# Patient Record
Sex: Female | Born: 1935 | Race: White | Hispanic: No | Marital: Married | Smoking: Never smoker
Health system: Southern US, Community
[De-identification: ages and names within clinical notes are randomized; demographics above are authoritative.]

## PROBLEM LIST (undated history)

## (undated) ENCOUNTER — Emergency Department (HOSPITAL_COMMUNITY): Admission: EM | Discharge status: Absent without leave | Payer: Medicare Other

## (undated) DIAGNOSIS — M069 Rheumatoid arthritis, unspecified: Secondary | ICD-10-CM

## (undated) DIAGNOSIS — E669 Obesity, unspecified: Secondary | ICD-10-CM

## (undated) DIAGNOSIS — R001 Bradycardia, unspecified: Secondary | ICD-10-CM

## (undated) DIAGNOSIS — E1140 Type 2 diabetes mellitus with diabetic neuropathy, unspecified: Secondary | ICD-10-CM

## (undated) DIAGNOSIS — Z9889 Other specified postprocedural states: Secondary | ICD-10-CM

## (undated) DIAGNOSIS — I493 Ventricular premature depolarization: Secondary | ICD-10-CM

## (undated) DIAGNOSIS — K589 Irritable bowel syndrome without diarrhea: Secondary | ICD-10-CM

## (undated) DIAGNOSIS — Z7901 Long term (current) use of anticoagulants: Secondary | ICD-10-CM

## (undated) DIAGNOSIS — I4510 Unspecified right bundle-branch block: Secondary | ICD-10-CM

## (undated) DIAGNOSIS — N179 Acute kidney failure, unspecified: Secondary | ICD-10-CM

## (undated) DIAGNOSIS — M810 Age-related osteoporosis without current pathological fracture: Secondary | ICD-10-CM

## (undated) DIAGNOSIS — I440 Atrioventricular block, first degree: Secondary | ICD-10-CM

## (undated) DIAGNOSIS — E785 Hyperlipidemia, unspecified: Secondary | ICD-10-CM

## (undated) DIAGNOSIS — E119 Type 2 diabetes mellitus without complications: Secondary | ICD-10-CM

## (undated) DIAGNOSIS — I2510 Atherosclerotic heart disease of native coronary artery without angina pectoris: Secondary | ICD-10-CM

## (undated) DIAGNOSIS — J189 Pneumonia, unspecified organism: Secondary | ICD-10-CM

## (undated) DIAGNOSIS — N183 Chronic kidney disease, stage 3 unspecified: Secondary | ICD-10-CM

## (undated) DIAGNOSIS — M4800 Spinal stenosis, site unspecified: Secondary | ICD-10-CM

## (undated) DIAGNOSIS — K5732 Diverticulitis of large intestine without perforation or abscess without bleeding: Secondary | ICD-10-CM

## (undated) DIAGNOSIS — E042 Nontoxic multinodular goiter: Secondary | ICD-10-CM

## (undated) DIAGNOSIS — I10 Essential (primary) hypertension: Secondary | ICD-10-CM

## (undated) DIAGNOSIS — M1990 Unspecified osteoarthritis, unspecified site: Secondary | ICD-10-CM

## (undated) DIAGNOSIS — I951 Orthostatic hypotension: Secondary | ICD-10-CM

## (undated) DIAGNOSIS — C801 Malignant (primary) neoplasm, unspecified: Secondary | ICD-10-CM

## (undated) DIAGNOSIS — D649 Anemia, unspecified: Secondary | ICD-10-CM

## (undated) DIAGNOSIS — K219 Gastro-esophageal reflux disease without esophagitis: Secondary | ICD-10-CM

## (undated) HISTORY — DX: Nontoxic multinodular goiter: E04.2

## (undated) HISTORY — DX: Essential (primary) hypertension: I10

## (undated) HISTORY — PX: OTHER SURGICAL HISTORY: SHX169

## (undated) HISTORY — PX: CORONARY ANGIOPLASTY: SHX604

## (undated) HISTORY — DX: Rheumatoid arthritis, unspecified: M06.9

## (undated) HISTORY — DX: Long term (current) use of anticoagulants: Z79.01

## (undated) HISTORY — DX: Gastro-esophageal reflux disease without esophagitis: K21.9

## (undated) HISTORY — PX: CHOLECYSTECTOMY: SHX55

## (undated) HISTORY — PX: NASAL SINUS SURGERY: SHX719

## (undated) HISTORY — DX: Acute kidney failure, unspecified: N17.9

## (undated) HISTORY — DX: Obesity, unspecified: E66.9

## (undated) HISTORY — DX: Atherosclerotic heart disease of native coronary artery without angina pectoris: I25.10

## (undated) HISTORY — DX: Unspecified osteoarthritis, unspecified site: M19.90

## (undated) HISTORY — DX: Hyperlipidemia, unspecified: E78.5

## (undated) HISTORY — DX: Diverticulitis of large intestine without perforation or abscess without bleeding: K57.32

## (undated) HISTORY — DX: Type 2 diabetes mellitus without complications: E11.9

## (undated) HISTORY — DX: Orthostatic hypotension: I95.1

## (undated) HISTORY — DX: Type 2 diabetes mellitus with diabetic neuropathy, unspecified: E11.40

## (undated) HISTORY — DX: Other specified postprocedural states: Z98.890

## (undated) HISTORY — DX: Age-related osteoporosis without current pathological fracture: M81.0

## (undated) HISTORY — DX: Spinal stenosis, site unspecified: M48.00

## (undated) HISTORY — DX: Irritable bowel syndrome, unspecified: K58.9

## (undated) HISTORY — PX: ABDOMINAL HYSTERECTOMY: SHX81

## (undated) HISTORY — DX: Anemia, unspecified: D64.9

---

## 1997-11-17 ENCOUNTER — Ambulatory Visit (HOSPITAL_COMMUNITY): Admission: RE | Admit: 1997-11-17 | Discharge: 1997-11-17 | Payer: Self-pay | Admitting: Otolaryngology

## 1998-01-03 ENCOUNTER — Ambulatory Visit (HOSPITAL_BASED_OUTPATIENT_CLINIC_OR_DEPARTMENT_OTHER): Admission: RE | Admit: 1998-01-03 | Discharge: 1998-01-03 | Payer: Self-pay | Admitting: Otolaryngology

## 1998-06-28 ENCOUNTER — Encounter: Admission: RE | Admit: 1998-06-28 | Discharge: 1998-06-28 | Payer: Self-pay | Admitting: Infectious Diseases

## 1999-08-08 ENCOUNTER — Encounter: Admission: RE | Admit: 1999-08-08 | Discharge: 1999-08-08 | Payer: Self-pay | Admitting: Family Medicine

## 1999-08-08 ENCOUNTER — Encounter: Payer: Self-pay | Admitting: Family Medicine

## 2000-03-15 ENCOUNTER — Encounter: Payer: Self-pay | Admitting: Family Medicine

## 2000-03-15 ENCOUNTER — Encounter: Admission: RE | Admit: 2000-03-15 | Discharge: 2000-03-15 | Payer: Self-pay | Admitting: Family Medicine

## 2000-10-03 ENCOUNTER — Encounter: Payer: Self-pay | Admitting: Internal Medicine

## 2000-10-04 ENCOUNTER — Encounter: Payer: Self-pay | Admitting: Internal Medicine

## 2000-10-04 ENCOUNTER — Ambulatory Visit (HOSPITAL_COMMUNITY): Admission: RE | Admit: 2000-10-04 | Discharge: 2000-10-04 | Payer: Self-pay | Admitting: Internal Medicine

## 2000-10-10 ENCOUNTER — Other Ambulatory Visit: Admission: RE | Admit: 2000-10-10 | Discharge: 2000-10-10 | Payer: Self-pay | Admitting: Obstetrics and Gynecology

## 2001-02-04 ENCOUNTER — Ambulatory Visit: Admission: RE | Admit: 2001-02-04 | Discharge: 2001-02-04 | Payer: Self-pay | Admitting: Family Medicine

## 2001-03-17 ENCOUNTER — Encounter: Payer: Self-pay | Admitting: Family Medicine

## 2001-03-17 ENCOUNTER — Encounter: Admission: RE | Admit: 2001-03-17 | Discharge: 2001-03-17 | Payer: Self-pay | Admitting: Family Medicine

## 2002-01-12 ENCOUNTER — Encounter: Payer: Self-pay | Admitting: Family Medicine

## 2002-01-12 ENCOUNTER — Encounter: Admission: RE | Admit: 2002-01-12 | Discharge: 2002-01-12 | Payer: Self-pay | Admitting: Family Medicine

## 2002-03-31 ENCOUNTER — Encounter: Payer: Self-pay | Admitting: Family Medicine

## 2002-03-31 ENCOUNTER — Encounter: Admission: RE | Admit: 2002-03-31 | Discharge: 2002-03-31 | Payer: Self-pay | Admitting: Family Medicine

## 2002-04-24 ENCOUNTER — Other Ambulatory Visit: Admission: RE | Admit: 2002-04-24 | Discharge: 2002-04-24 | Payer: Self-pay | Admitting: Obstetrics and Gynecology

## 2003-01-02 ENCOUNTER — Encounter: Admission: RE | Admit: 2003-01-02 | Discharge: 2003-01-02 | Payer: Self-pay | Admitting: Family Medicine

## 2003-01-02 ENCOUNTER — Encounter: Payer: Self-pay | Admitting: Family Medicine

## 2003-02-11 ENCOUNTER — Encounter: Payer: Self-pay | Admitting: Family Medicine

## 2003-02-11 ENCOUNTER — Encounter: Admission: RE | Admit: 2003-02-11 | Discharge: 2003-02-11 | Payer: Self-pay | Admitting: Family Medicine

## 2003-03-03 ENCOUNTER — Ambulatory Visit (HOSPITAL_COMMUNITY): Admission: RE | Admit: 2003-03-03 | Discharge: 2003-03-03 | Payer: Self-pay | Admitting: Family Medicine

## 2003-03-03 ENCOUNTER — Encounter: Payer: Self-pay | Admitting: Family Medicine

## 2003-04-05 ENCOUNTER — Encounter: Payer: Self-pay | Admitting: Family Medicine

## 2003-04-05 ENCOUNTER — Encounter: Admission: RE | Admit: 2003-04-05 | Discharge: 2003-04-05 | Payer: Self-pay | Admitting: Family Medicine

## 2003-04-21 ENCOUNTER — Ambulatory Visit: Admission: RE | Admit: 2003-04-21 | Discharge: 2003-04-21 | Payer: Self-pay | Admitting: Orthopedic Surgery

## 2003-05-17 ENCOUNTER — Other Ambulatory Visit: Admission: RE | Admit: 2003-05-17 | Discharge: 2003-05-17 | Payer: Self-pay | Admitting: Obstetrics and Gynecology

## 2004-01-03 ENCOUNTER — Observation Stay (HOSPITAL_COMMUNITY): Admission: AD | Admit: 2004-01-03 | Discharge: 2004-01-07 | Payer: Self-pay | Admitting: *Deleted

## 2004-04-06 ENCOUNTER — Encounter: Admission: RE | Admit: 2004-04-06 | Discharge: 2004-04-06 | Payer: Self-pay | Admitting: Internal Medicine

## 2004-05-25 ENCOUNTER — Other Ambulatory Visit: Admission: RE | Admit: 2004-05-25 | Discharge: 2004-05-25 | Payer: Self-pay | Admitting: Obstetrics and Gynecology

## 2004-05-29 ENCOUNTER — Ambulatory Visit: Payer: Self-pay | Admitting: Internal Medicine

## 2004-06-08 ENCOUNTER — Ambulatory Visit: Payer: Self-pay | Admitting: Internal Medicine

## 2004-07-10 ENCOUNTER — Ambulatory Visit: Payer: Self-pay | Admitting: Internal Medicine

## 2004-07-19 ENCOUNTER — Ambulatory Visit: Payer: Self-pay | Admitting: Internal Medicine

## 2004-07-26 ENCOUNTER — Ambulatory Visit: Payer: Self-pay | Admitting: Internal Medicine

## 2004-07-31 ENCOUNTER — Ambulatory Visit: Payer: Self-pay | Admitting: Oncology

## 2004-08-11 ENCOUNTER — Ambulatory Visit: Payer: Self-pay | Admitting: Internal Medicine

## 2004-08-31 ENCOUNTER — Ambulatory Visit: Payer: Self-pay | Admitting: Internal Medicine

## 2004-11-21 ENCOUNTER — Ambulatory Visit: Payer: Self-pay | Admitting: Internal Medicine

## 2004-11-28 ENCOUNTER — Ambulatory Visit: Payer: Self-pay | Admitting: Internal Medicine

## 2004-12-14 ENCOUNTER — Ambulatory Visit: Payer: Self-pay | Admitting: Internal Medicine

## 2004-12-25 ENCOUNTER — Ambulatory Visit: Payer: Self-pay | Admitting: Internal Medicine

## 2005-01-05 ENCOUNTER — Inpatient Hospital Stay (HOSPITAL_COMMUNITY): Admission: EM | Admit: 2005-01-05 | Discharge: 2005-01-09 | Payer: Self-pay | Admitting: Emergency Medicine

## 2005-01-05 ENCOUNTER — Ambulatory Visit: Payer: Self-pay | Admitting: Cardiovascular Disease

## 2005-01-05 ENCOUNTER — Ambulatory Visit: Payer: Self-pay | Admitting: Internal Medicine

## 2005-01-25 ENCOUNTER — Ambulatory Visit: Payer: Self-pay | Admitting: *Deleted

## 2005-02-12 ENCOUNTER — Ambulatory Visit: Payer: Self-pay | Admitting: Internal Medicine

## 2005-02-12 ENCOUNTER — Encounter (HOSPITAL_COMMUNITY): Admission: RE | Admit: 2005-02-12 | Discharge: 2005-05-13 | Payer: Self-pay | Admitting: Internal Medicine

## 2005-02-19 ENCOUNTER — Ambulatory Visit: Payer: Self-pay | Admitting: Internal Medicine

## 2005-02-19 ENCOUNTER — Ambulatory Visit: Payer: Self-pay

## 2005-02-20 ENCOUNTER — Ambulatory Visit: Payer: Self-pay | Admitting: Internal Medicine

## 2005-03-13 ENCOUNTER — Ambulatory Visit: Payer: Self-pay | Admitting: Internal Medicine

## 2005-03-29 ENCOUNTER — Ambulatory Visit: Payer: Self-pay | Admitting: Internal Medicine

## 2005-04-17 ENCOUNTER — Ambulatory Visit: Payer: Self-pay | Admitting: Internal Medicine

## 2005-04-24 ENCOUNTER — Encounter: Admission: RE | Admit: 2005-04-24 | Discharge: 2005-04-24 | Payer: Self-pay | Admitting: Internal Medicine

## 2005-05-14 ENCOUNTER — Encounter (HOSPITAL_COMMUNITY): Admission: RE | Admit: 2005-05-14 | Discharge: 2005-06-16 | Payer: Self-pay | Admitting: Internal Medicine

## 2005-05-16 ENCOUNTER — Ambulatory Visit: Payer: Self-pay | Admitting: Internal Medicine

## 2005-06-15 ENCOUNTER — Ambulatory Visit: Payer: Self-pay | Admitting: Endocrinology

## 2005-07-09 ENCOUNTER — Encounter: Admission: RE | Admit: 2005-07-09 | Discharge: 2005-07-09 | Payer: Self-pay | Admitting: Neurology

## 2005-07-19 ENCOUNTER — Ambulatory Visit: Payer: Self-pay | Admitting: Internal Medicine

## 2005-08-16 ENCOUNTER — Ambulatory Visit: Payer: Self-pay | Admitting: Internal Medicine

## 2005-08-17 ENCOUNTER — Ambulatory Visit (HOSPITAL_COMMUNITY): Admission: RE | Admit: 2005-08-17 | Discharge: 2005-08-17 | Payer: Self-pay | Admitting: Internal Medicine

## 2005-11-12 ENCOUNTER — Ambulatory Visit: Payer: Self-pay | Admitting: Internal Medicine

## 2006-01-21 ENCOUNTER — Ambulatory Visit (HOSPITAL_COMMUNITY): Admission: RE | Admit: 2006-01-21 | Discharge: 2006-01-21 | Payer: Self-pay | Admitting: Internal Medicine

## 2006-03-18 ENCOUNTER — Ambulatory Visit: Payer: Self-pay

## 2006-04-30 ENCOUNTER — Emergency Department (HOSPITAL_COMMUNITY): Admission: EM | Admit: 2006-04-30 | Discharge: 2006-04-30 | Payer: Self-pay | Admitting: Emergency Medicine

## 2006-05-14 ENCOUNTER — Encounter: Admission: RE | Admit: 2006-05-14 | Discharge: 2006-05-14 | Payer: Self-pay | Admitting: Family Medicine

## 2006-09-09 ENCOUNTER — Ambulatory Visit: Payer: Self-pay | Admitting: Internal Medicine

## 2006-09-30 ENCOUNTER — Ambulatory Visit: Payer: Self-pay | Admitting: Internal Medicine

## 2006-12-23 ENCOUNTER — Ambulatory Visit: Payer: Self-pay | Admitting: Internal Medicine

## 2006-12-23 LAB — CONVERTED CEMR LAB
ALT: 20 units/L (ref 0–40)
AST: 25 units/L (ref 0–37)
LDL Cholesterol: 87 mg/dL (ref 0–99)
Total CHOL/HDL Ratio: 3.3
VLDL: 20 mg/dL (ref 0–40)

## 2007-02-12 DIAGNOSIS — G629 Polyneuropathy, unspecified: Secondary | ICD-10-CM | POA: Insufficient documentation

## 2007-02-12 DIAGNOSIS — K219 Gastro-esophageal reflux disease without esophagitis: Secondary | ICD-10-CM

## 2007-02-12 DIAGNOSIS — E119 Type 2 diabetes mellitus without complications: Secondary | ICD-10-CM

## 2007-02-12 DIAGNOSIS — D649 Anemia, unspecified: Secondary | ICD-10-CM | POA: Insufficient documentation

## 2007-02-12 DIAGNOSIS — J309 Allergic rhinitis, unspecified: Secondary | ICD-10-CM | POA: Insufficient documentation

## 2007-02-12 DIAGNOSIS — K5730 Diverticulosis of large intestine without perforation or abscess without bleeding: Secondary | ICD-10-CM | POA: Insufficient documentation

## 2007-02-12 DIAGNOSIS — E785 Hyperlipidemia, unspecified: Secondary | ICD-10-CM

## 2007-02-26 ENCOUNTER — Ambulatory Visit: Payer: Self-pay | Admitting: Internal Medicine

## 2007-03-05 ENCOUNTER — Encounter: Admission: RE | Admit: 2007-03-05 | Discharge: 2007-03-05 | Payer: Self-pay | Admitting: Internal Medicine

## 2007-04-23 ENCOUNTER — Ambulatory Visit: Payer: Self-pay | Admitting: Internal Medicine

## 2007-05-19 ENCOUNTER — Ambulatory Visit (HOSPITAL_COMMUNITY): Admission: RE | Admit: 2007-05-19 | Discharge: 2007-05-19 | Payer: Self-pay | Admitting: Internal Medicine

## 2007-05-19 ENCOUNTER — Encounter: Payer: Self-pay | Admitting: Internal Medicine

## 2007-05-26 ENCOUNTER — Ambulatory Visit: Payer: Self-pay | Admitting: Internal Medicine

## 2007-09-15 ENCOUNTER — Ambulatory Visit (HOSPITAL_COMMUNITY): Admission: RE | Admit: 2007-09-15 | Discharge: 2007-09-15 | Payer: Self-pay | Admitting: Otolaryngology

## 2007-09-30 DIAGNOSIS — M4800 Spinal stenosis, site unspecified: Secondary | ICD-10-CM

## 2007-09-30 DIAGNOSIS — E1149 Type 2 diabetes mellitus with other diabetic neurological complication: Secondary | ICD-10-CM

## 2007-09-30 DIAGNOSIS — K449 Diaphragmatic hernia without obstruction or gangrene: Secondary | ICD-10-CM | POA: Insufficient documentation

## 2007-09-30 DIAGNOSIS — E049 Nontoxic goiter, unspecified: Secondary | ICD-10-CM | POA: Insufficient documentation

## 2007-09-30 DIAGNOSIS — R609 Edema, unspecified: Secondary | ICD-10-CM

## 2007-09-30 DIAGNOSIS — M1990 Unspecified osteoarthritis, unspecified site: Secondary | ICD-10-CM | POA: Insufficient documentation

## 2007-09-30 DIAGNOSIS — E669 Obesity, unspecified: Secondary | ICD-10-CM

## 2007-10-15 ENCOUNTER — Ambulatory Visit: Payer: Self-pay | Admitting: Cardiology

## 2007-10-15 ENCOUNTER — Inpatient Hospital Stay (HOSPITAL_COMMUNITY): Admission: AD | Admit: 2007-10-15 | Discharge: 2007-10-17 | Payer: Self-pay | Admitting: Internal Medicine

## 2007-10-15 ENCOUNTER — Ambulatory Visit: Payer: Self-pay | Admitting: Internal Medicine

## 2007-10-29 ENCOUNTER — Ambulatory Visit: Payer: Self-pay | Admitting: Internal Medicine

## 2007-10-29 ENCOUNTER — Encounter: Payer: Self-pay | Admitting: Physician Assistant

## 2007-10-30 ENCOUNTER — Ambulatory Visit: Payer: Self-pay | Admitting: Internal Medicine

## 2007-10-30 ENCOUNTER — Inpatient Hospital Stay (HOSPITAL_COMMUNITY): Admission: EM | Admit: 2007-10-30 | Discharge: 2007-11-01 | Payer: Self-pay | Admitting: Emergency Medicine

## 2007-10-31 ENCOUNTER — Encounter (INDEPENDENT_AMBULATORY_CARE_PROVIDER_SITE_OTHER): Payer: Self-pay | Admitting: Internal Medicine

## 2007-12-03 ENCOUNTER — Ambulatory Visit: Payer: Self-pay | Admitting: Internal Medicine

## 2007-12-03 DIAGNOSIS — R079 Chest pain, unspecified: Secondary | ICD-10-CM

## 2007-12-03 DIAGNOSIS — R498 Other voice and resonance disorders: Secondary | ICD-10-CM | POA: Insufficient documentation

## 2007-12-04 ENCOUNTER — Ambulatory Visit: Payer: Self-pay | Admitting: Internal Medicine

## 2007-12-08 ENCOUNTER — Encounter: Payer: Self-pay | Admitting: Internal Medicine

## 2007-12-08 ENCOUNTER — Ambulatory Visit (HOSPITAL_COMMUNITY): Admission: RE | Admit: 2007-12-08 | Discharge: 2007-12-08 | Payer: Self-pay | Admitting: Internal Medicine

## 2007-12-11 ENCOUNTER — Ambulatory Visit: Payer: Self-pay

## 2007-12-16 ENCOUNTER — Ambulatory Visit: Payer: Self-pay | Admitting: Internal Medicine

## 2007-12-31 ENCOUNTER — Encounter: Payer: Self-pay | Admitting: Internal Medicine

## 2008-03-24 ENCOUNTER — Encounter: Admission: RE | Admit: 2008-03-24 | Discharge: 2008-03-24 | Payer: Self-pay | Admitting: Family Medicine

## 2008-11-30 ENCOUNTER — Encounter: Payer: Self-pay | Admitting: Internal Medicine

## 2009-01-21 ENCOUNTER — Ambulatory Visit: Payer: Self-pay | Admitting: Internal Medicine

## 2009-01-21 DIAGNOSIS — R109 Unspecified abdominal pain: Secondary | ICD-10-CM

## 2009-01-25 ENCOUNTER — Encounter: Payer: Self-pay | Admitting: Internal Medicine

## 2009-01-25 ENCOUNTER — Ambulatory Visit: Payer: Self-pay | Admitting: Cardiology

## 2009-02-10 ENCOUNTER — Ambulatory Visit: Payer: Self-pay | Admitting: Internal Medicine

## 2009-02-14 ENCOUNTER — Ambulatory Visit: Payer: Self-pay | Admitting: Internal Medicine

## 2009-02-18 ENCOUNTER — Ambulatory Visit: Payer: Self-pay | Admitting: Internal Medicine

## 2009-04-04 ENCOUNTER — Telehealth: Payer: Self-pay | Admitting: Internal Medicine

## 2009-04-12 ENCOUNTER — Inpatient Hospital Stay (HOSPITAL_COMMUNITY): Admission: EM | Admit: 2009-04-12 | Discharge: 2009-04-14 | Payer: Self-pay | Admitting: Emergency Medicine

## 2009-04-12 ENCOUNTER — Ambulatory Visit: Payer: Self-pay | Admitting: Cardiovascular Disease

## 2009-04-13 ENCOUNTER — Encounter (INDEPENDENT_AMBULATORY_CARE_PROVIDER_SITE_OTHER): Payer: Self-pay | Admitting: Internal Medicine

## 2009-04-19 ENCOUNTER — Encounter: Payer: Self-pay | Admitting: Internal Medicine

## 2009-04-27 ENCOUNTER — Encounter: Admission: RE | Admit: 2009-04-27 | Discharge: 2009-04-27 | Payer: Self-pay | Admitting: Family Medicine

## 2009-04-27 ENCOUNTER — Encounter: Payer: Self-pay | Admitting: Internal Medicine

## 2009-04-27 ENCOUNTER — Ambulatory Visit: Payer: Self-pay

## 2009-05-09 ENCOUNTER — Encounter: Payer: Self-pay | Admitting: Internal Medicine

## 2009-05-13 ENCOUNTER — Ambulatory Visit: Payer: Self-pay | Admitting: Internal Medicine

## 2009-05-13 DIAGNOSIS — E860 Dehydration: Secondary | ICD-10-CM | POA: Insufficient documentation

## 2009-05-14 ENCOUNTER — Encounter: Payer: Self-pay | Admitting: Internal Medicine

## 2009-05-16 LAB — CONVERTED CEMR LAB
Calcium: 9.7 mg/dL (ref 8.4–10.5)
Creatinine, Ser: 1.2 mg/dL (ref 0.4–1.2)
GFR calc non Af Amer: 45.58 mL/min (ref >60)
Sodium: 143 meq/L (ref 135–145)

## 2009-05-18 ENCOUNTER — Ambulatory Visit: Payer: Self-pay | Admitting: Cardiology

## 2009-05-23 ENCOUNTER — Ambulatory Visit: Payer: Self-pay | Admitting: Internal Medicine

## 2009-05-23 DIAGNOSIS — I951 Orthostatic hypotension: Secondary | ICD-10-CM | POA: Insufficient documentation

## 2009-05-24 LAB — CONVERTED CEMR LAB
Hemoglobin, Urine: NEGATIVE
Specific Gravity, Urine: 1.025 (ref 1.000–1.030)
Urobilinogen, UA: 0.2 (ref 0.0–1.0)

## 2009-06-01 ENCOUNTER — Encounter: Admission: RE | Admit: 2009-06-01 | Discharge: 2009-06-01 | Payer: Self-pay | Admitting: Unknown Physician Specialty

## 2009-06-01 ENCOUNTER — Encounter: Payer: Self-pay | Admitting: Internal Medicine

## 2009-06-03 ENCOUNTER — Telehealth: Payer: Self-pay | Admitting: Internal Medicine

## 2009-06-07 ENCOUNTER — Ambulatory Visit: Payer: Self-pay | Admitting: Internal Medicine

## 2009-06-07 DIAGNOSIS — J984 Other disorders of lung: Secondary | ICD-10-CM

## 2009-06-07 DIAGNOSIS — R0602 Shortness of breath: Secondary | ICD-10-CM

## 2009-06-07 DIAGNOSIS — R071 Chest pain on breathing: Secondary | ICD-10-CM | POA: Insufficient documentation

## 2009-06-07 LAB — CONVERTED CEMR LAB
BUN: 21 mg/dL (ref 6–23)
Basophils Absolute: 0.1 10*3/uL (ref 0.0–0.1)
Basophils Relative: 1.3 % (ref 0.0–3.0)
Calcium: 9.1 mg/dL (ref 8.4–10.5)
Chloride: 101 meq/L (ref 96–112)
Creatinine, Ser: 1.1 mg/dL (ref 0.4–1.2)
Eosinophils Absolute: 0.2 10*3/uL (ref 0.0–0.7)
GFR calc non Af Amer: 50.38 mL/min (ref >60)
Hemoglobin: 13 g/dL (ref 12.0–15.0)
Lymphocytes Relative: 32.3 % (ref 12.0–46.0)
MCHC: 34.5 g/dL (ref 30.0–36.0)
MCV: 93.5 fL (ref 78.0–100.0)
Monocytes Absolute: 0.7 10*3/uL (ref 0.1–1.0)
Neutro Abs: 3.2 10*3/uL (ref 1.4–7.7)
RBC: 4.04 M/uL (ref 3.87–5.11)
RDW: 12.5 % (ref 11.5–14.6)

## 2009-06-08 ENCOUNTER — Telehealth: Payer: Self-pay | Admitting: Internal Medicine

## 2009-06-08 ENCOUNTER — Ambulatory Visit: Payer: Self-pay | Admitting: Internal Medicine

## 2009-06-09 ENCOUNTER — Ambulatory Visit (HOSPITAL_COMMUNITY): Admission: RE | Admit: 2009-06-09 | Discharge: 2009-06-09 | Payer: Self-pay | Admitting: Internal Medicine

## 2009-06-17 ENCOUNTER — Ambulatory Visit: Payer: Self-pay | Admitting: Internal Medicine

## 2009-06-17 DIAGNOSIS — J449 Chronic obstructive pulmonary disease, unspecified: Secondary | ICD-10-CM

## 2009-06-17 DIAGNOSIS — J209 Acute bronchitis, unspecified: Secondary | ICD-10-CM | POA: Insufficient documentation

## 2009-07-06 ENCOUNTER — Encounter: Payer: Self-pay | Admitting: Internal Medicine

## 2009-07-25 ENCOUNTER — Encounter (HOSPITAL_COMMUNITY): Admission: RE | Admit: 2009-07-25 | Discharge: 2009-10-23 | Payer: Self-pay | Admitting: Internal Medicine

## 2009-08-01 ENCOUNTER — Encounter: Payer: Self-pay | Admitting: Internal Medicine

## 2009-08-19 ENCOUNTER — Ambulatory Visit: Payer: Self-pay | Admitting: Internal Medicine

## 2009-08-24 ENCOUNTER — Encounter: Payer: Self-pay | Admitting: Internal Medicine

## 2009-08-25 ENCOUNTER — Ambulatory Visit: Payer: Self-pay | Admitting: Internal Medicine

## 2009-09-01 ENCOUNTER — Encounter: Payer: Self-pay | Admitting: Internal Medicine

## 2009-11-23 ENCOUNTER — Ambulatory Visit: Payer: Self-pay | Admitting: Internal Medicine

## 2009-11-25 LAB — CONVERTED CEMR LAB
Cholesterol: 139 mg/dL (ref 0–200)
Triglycerides: 96 mg/dL (ref 0.0–149.0)

## 2009-12-08 ENCOUNTER — Encounter: Payer: Self-pay | Admitting: Internal Medicine

## 2009-12-09 ENCOUNTER — Ambulatory Visit: Payer: Self-pay | Admitting: Internal Medicine

## 2009-12-09 DIAGNOSIS — R001 Bradycardia, unspecified: Secondary | ICD-10-CM

## 2009-12-14 ENCOUNTER — Ambulatory Visit: Payer: Self-pay | Admitting: Internal Medicine

## 2009-12-22 ENCOUNTER — Telehealth (INDEPENDENT_AMBULATORY_CARE_PROVIDER_SITE_OTHER): Payer: Self-pay | Admitting: *Deleted

## 2010-01-27 ENCOUNTER — Ambulatory Visit: Payer: Self-pay | Admitting: Cardiology

## 2010-01-27 ENCOUNTER — Telehealth: Payer: Self-pay | Admitting: Internal Medicine

## 2010-01-27 ENCOUNTER — Observation Stay (HOSPITAL_COMMUNITY): Admission: EM | Admit: 2010-01-27 | Discharge: 2010-01-31 | Payer: Self-pay | Admitting: Emergency Medicine

## 2010-01-31 ENCOUNTER — Encounter: Payer: Self-pay | Admitting: Cardiology

## 2010-02-20 ENCOUNTER — Ambulatory Visit: Payer: Self-pay | Admitting: Internal Medicine

## 2010-05-19 ENCOUNTER — Encounter: Admission: RE | Admit: 2010-05-19 | Discharge: 2010-05-19 | Payer: Self-pay | Admitting: Family Medicine

## 2010-05-31 ENCOUNTER — Encounter: Admission: RE | Admit: 2010-05-31 | Discharge: 2010-05-31 | Payer: Self-pay | Admitting: Internal Medicine

## 2010-06-01 ENCOUNTER — Telehealth (INDEPENDENT_AMBULATORY_CARE_PROVIDER_SITE_OTHER): Payer: Self-pay | Admitting: *Deleted

## 2010-06-20 ENCOUNTER — Ambulatory Visit: Payer: Self-pay | Admitting: Internal Medicine

## 2010-06-21 ENCOUNTER — Telehealth (INDEPENDENT_AMBULATORY_CARE_PROVIDER_SITE_OTHER): Payer: Self-pay | Admitting: *Deleted

## 2010-06-21 ENCOUNTER — Telehealth: Payer: Self-pay | Admitting: Internal Medicine

## 2010-06-22 ENCOUNTER — Ambulatory Visit (HOSPITAL_COMMUNITY)
Admission: RE | Admit: 2010-06-22 | Discharge: 2010-06-22 | Payer: Self-pay | Source: Home / Self Care | Admitting: Internal Medicine

## 2010-06-29 ENCOUNTER — Encounter
Admission: RE | Admit: 2010-06-29 | Discharge: 2010-06-29 | Payer: Self-pay | Source: Home / Self Care | Attending: Family Medicine | Admitting: Family Medicine

## 2010-07-03 ENCOUNTER — Telehealth: Payer: Self-pay | Admitting: Internal Medicine

## 2010-08-12 ENCOUNTER — Encounter: Payer: Self-pay | Admitting: Internal Medicine

## 2010-08-13 ENCOUNTER — Encounter: Payer: Self-pay | Admitting: Family Medicine

## 2010-08-14 ENCOUNTER — Encounter: Payer: Self-pay | Admitting: Family Medicine

## 2010-08-15 ENCOUNTER — Telehealth: Payer: Self-pay | Admitting: Internal Medicine

## 2010-08-22 NOTE — Assessment & Plan Note (Signed)
Summary: rov/chest pain/jml   Visit Type:  Follow-up Referring Provider:  n/a Primary Provider:  Herb Grays MD  CC:  pt saw primary care yesterday she was having chest pain.  History of Present Illness:  Patient is a 77 year old with a histoyr of CAD (last cath 2009:  LAD with no instent restenosis; minimal CAD elsewhere), hypertension and orthostatic hypotension.  She also has a history of deconditioning and has undergone pulm rehab.Marland Kitchen  She was last seen in clinic earlier this winter. She was referred from Dr. Yehuda Budd office for evaluation of chest pain. Per the patient's reprot she has had pain under her L breast intermitt over the past few days.  the first time occurred while she was driving.  No associated by SOB  Pain was sharp.  Occurred 3 to 4 x per day.  lasted a couple of miniutes. She was seen in Dr. Yehuda Budd office   EKG was done.   She does not think pain was pleuritic or positional but did not pay much attention.   She has not had any spells today.  Current Medications (verified): 1)  Caltrate 600+d 600-400 Mg-Unit  Tabs (Calcium Carbonate-Vitamin D) .... One By Mouth Two Times A Day 2)  Nitrostat 0.4 Mg  Subl (Nitroglycerin) .... By Mouth Sub Lingual As Needed 3)  Amlodipine Besylate 2.5 Mg Tabs (Amlodipine Besylate) .... Daily 4)  Bayer Aspirin Ec Low Dose 81 Mg  Tbec (Aspirin) .... One Tablet By Mouth Once Daily 5)  Crestor 20 Mg Tabs (Rosuvastatin Calcium) .Marland Kitchen.. 1  Tablet By Mouth Once Daily 6)  Glyburide-Metformin 5-500 Mg Tabs (Glyburide-Metformin) .... Daily 7)  Fosamax 70 Mg Tabs (Alendronate Sodium) .... One Tablet By Mouth Weekly 8)  Gabapentin 300 Mg Caps (Gabapentin) .Marland Kitchen.. 1 Tab By Mouth Three Times A Day 9)  Centrum Silver  Tabs (Multiple Vitamins-Minerals) .... One Tablet By Mouth Once Daily 10)  Fish Oil   Oil (Fish Oil) .Marland Kitchen.. 1 Tab By Mouth Once Daily  Allergies (verified): 1)  ! Amoxicillin (Amoxicillin)  Past History:  Past medical, surgical, family and  social histories (including risk factors) reviewed, and no changes noted (except as noted below).  Past Medical History: Reviewed history from 06/07/2009 and no changes required. Allergic rhinitis Anemia-NOS chronic disease. Hgb 11.6gm% 04/14/2009 Coronary artery disease Diabetes mellitus, type II Diverticulosis, colon GERD Hyperlipidemia Hypertension DM neuropathy IBS Osteoarthritis Spinal stenois, djd lumbar Multinodular goiter Normal VQ scan for PE workup - April 2009 orthostatic hypotension Obesity  bMI 37 S/p Acute Renal failure due to dehydration - Sept 2009. Normal creat 1.2 on fu 05/13/2009  Past Surgical History: Reviewed history from 01/21/2009 and no changes required. Cholecystectomy Hysterectomy Sinus surgery Bilateral arthroscopic knee surgery sinus surgeryx2 Stent surgery  Family History: Reviewed history from 01/21/2009 and no changes required. Family History of Breast Cancer:Sister   Family History of Heart Disease: Brother  No FH of Colon Cancer:  Social History: Reviewed history from 06/07/2009 and no changes required. Occupation: retired Patient has never smoked.  Alcohol Use - no Illicit Drug Use - no Patient does not get regular exercise.  Married Passive smoker - children smoke in house. Exposed to their smoke for last 20-30 years  Vital Signs:  Patient profile:   77 year old female Height:      63 inches Weight:      210 pounds Pulse rate:   99 / minute BP sitting:   127 / 72  (left arm) Cuff size:  regular  Vitals Entered By: Burnett Kanaris, CNA (Dec 09, 2009 9:42 AM)  Physical Exam  Additional Exam:  Patient is in NAD HEENT:  Normocephalic, atraumatic. EOMI, PERRLA.  Neck: JVP is normal. No thyromegaly. No bruits.  Lungs: clear to auscultation. No rales no wheezes.  Heart: Regular rate and rhythm. Normal S1, S2. No S3.   No significant murmurs. PMI not displaced.  Abdomen:  Supple, nontender. Normal bowel sounds. No masses. No  hepatomegaly.  Extremities:   Good distal pulses throughout. No lower extremity edema.  Musculoskeletal :moving all extremities.  Neuro:   alert and oriented x3.    Impression & Recommendations:  Problem # 1:  CHEST PAIN (ICD-786.50) I am not convinced the patients chest pain is due t ocardiac ischemia.  I would keep  her on current regimen. He EKG has no significant ST T wave changes.  BUt it does show progressive PR prolongation.  I would recommend a holter monitor.  Problem # 2:  BRADYCARDIA (ICD-427.89) Repeat holter monitor.  Problem # 3:  C O P D (ICD-496) Stable.  Problem # 4:  HYPERLIPIDEMIA (ICD-272.4) Continue statin. Her updated medication list for this problem includes:    Crestor 20 Mg Tabs (Rosuvastatin calcium) .Marland Kitchen... 1  tablet by mouth once daily  Other Orders: EKG w/ Interpretation (93000) Holter (Holter) Prescriptions: CRESTOR 20 MG TABS (ROSUVASTATIN CALCIUM) 1  tablet by mouth once daily  #30 x 6   Entered by:   Layne Benton, RN, BSN   Authorized by:   Sherrill Raring, MD, [facility]   Signed by:   Layne Benton, RN, BSN on 12/09/2009   Method used:   Electronically to        [facility]* (retail)       [address]       [place], [place]  [address]       Ph: [phone]       Fax: [phone]   RxID:   [id number] AMLODIPINE BESYLATE 2.5 MG TABS (AMLODIPINE BESYLATE) daily  #30 x 6   Entered by:   Layne Benton, RN, BSN   Authorized by:   Sherrill Raring, MD, [facility]   Signed by:   Layne Benton, RN, BSN on 12/09/2009   Method used:   Electronically to        [facility]* (retail)       [address]       [place], [place]  [address]       Ph: [phone]       Fax: [phone]   RxID:   [id number] NITROSTAT 0.4 MG  SUBL (NITROGLYCERIN) by mouth sub lingual as needed  #25 x 11   Entered by:   Layne Benton, RN, BSN   Authorized by:   Sherrill Raring, MD, [facility]   Signed by:   Layne Benton, RN, BSN on 12/09/2009   Method used:   Electronically to        [facility]* (retail)       [address]       [place], [place]  [address]       Ph: [phone]       Fax: [phone]   RxID:   [id number]

## 2010-08-22 NOTE — Progress Notes (Signed)
Summary: Proair Refill  Phone Note Call from Patient Call back at Home Phone [phone]   Caller: Patient Call For: [email or web address] Reason for Call: Refill Medication, Talk to Nurse Summary of Call: Patient saw MR yesterday, patient needing rx for inhaler(proair)?.  [facility] Initial call taken by: Lehman Prom,  June 21, 2010 9:27 AM  Follow-up for Phone Call        Pt only needing refill [facility] [place] and aware we will send this today.Michel Bickers CMA  June 21, 2010 10:31 AM    Prescriptions: PROAIR HFA 108 (90 BASE) MCG/ACT AERS (ALBUTEROL SULFATE) 2 puffs every 6 hours as needed  #1 x 6   Entered by:   Michel Bickers CMA   Authorized by:   Kalman Shan MD   Signed by:   Michel Bickers CMA on 06/21/2010   Method used:   [facility]  [place] [address] ([facility]ail[facility]       [address]       [place], [place]  [address]       Ph: [phone] or [phone]       Fax: [phone]   RxID:   [id number]

## 2010-08-22 NOTE — Miscellaneous (Signed)
Summary: Pulm Progress Report/[facility]  Pulm Progress Report/[facility]   Imported By: Sherian Rein 11/21/2009 14:49:04  _____________________________________________________________________  External Attachment:    Type:   Image     Comment:   External Document

## 2010-08-22 NOTE — Assessment & Plan Note (Signed)
Summary: EPH/ APPT IS 3:45/ GD   Visit Type:  Follow-up Referring Provider:  n/a Primary Provider:  Herb Grays MD  CC:  no cardiac complaints.  History of Present Illness:  Patient is a 77 year old with a histoyr of CAD (last cath 2009:  LAD with no instent restenosis; minimal CAD elsewhere), hypertension and orthostatic hypotension.  She was recently hospitalized with chest pain to [facility] (01/2010).  She underwent myoview scan prior to D/C.  this showed no ischemia.  Since d/c she has not had any further chest pain.  Current Medications (verified): 1)  Caltrate 600+d 600-400 Mg-Unit  Tabs (Calcium Carbonate-Vitamin D) .... One By Mouth Two Times A Day 2)  Nitrostat 0.4 Mg  Subl (Nitroglycerin) .... By Mouth Sub Lingual As Needed 3)  Amlodipine Besylate 2.5 Mg Tabs (Amlodipine Besylate) .... Daily 4)  Bayer Aspirin Ec Low Dose 81 Mg  Tbec (Aspirin) .... One Tablet By Mouth Once Daily 5)  Crestor 20 Mg Tabs (Rosuvastatin Calcium) .Marland Kitchen.. 1  Tablet By Mouth Once Daily 6)  Glucotrol 5 Mg Tabs (Glipizide) .Marland Kitchen.. 1 Tab Qd 7)  Fosamax 70 Mg Tabs (Alendronate Sodium) .... One Tablet By Mouth Weekly 8)  Gabapentin 300 Mg Caps (Gabapentin) .... 2 Tabs Am- 1 Tab Pm- 3tabs At Bedtime 9)  Centrum Silver  Tabs (Multiple Vitamins-Minerals) .... One Tablet By Mouth Once Daily 10)  Fish Oil   Oil (Fish Oil) .Marland Kitchen.. 1 Tab By Mouth Once Daily 11)  Protonix 40 Mg Tbec (Pantoprazole Sodium) .Marland Kitchen.. 1 Tab Once Daily 12)  Artificial Tears  Soln (Artificial Tear Solution) .Marland Kitchen.. 1 Drop Both Eyes Two Times A Day As Needed 13)  Optivar 0.05 % Soln (Azelastine Hcl) .Marland Kitchen.. 1 Drop Both Eyes Two Times A Day As Needed 14)  Celebrex 200 Mg Caps (Celecoxib) .Marland Kitchen.. 1 Tab Once Daily 15)  Tylenol Extra Strength 500 Mg Tabs (Acetaminophen) .... 2 Tabs As Directed 16)  Vesicare 5 Mg Tabs (Solifenacin Succinate) .Marland Kitchen.. 1 Tab Qd  Allergies (verified): 1)  ! Amoxicillin (Amoxicillin)  Past History:  Past medical, surgical, family and  social histories (including risk factors) reviewed, and no changes noted (except as noted below).  Past Medical History: Reviewed history from 06/07/2009 and no changes required. Allergic rhinitis Anemia-NOS chronic disease. Hgb 11.6gm% 04/14/2009 Coronary artery disease Diabetes mellitus, type II Diverticulosis, colon GERD Hyperlipidemia Hypertension DM neuropathy IBS Osteoarthritis Spinal stenois, djd lumbar Multinodular goiter Normal VQ scan for PE workup - April 2009 orthostatic hypotension Obesity  bMI 37 S/p Acute Renal failure due to dehydration - Sept 2009. Normal creat 1.2 on fu 05/13/2009  Past Surgical History: Reviewed history from 01/21/2009 and no changes required. Cholecystectomy Hysterectomy Sinus surgery Bilateral arthroscopic knee surgery sinus surgeryx2 Stent surgery  Family History: Reviewed history from 01/21/2009 and no changes required. Family History of Breast Cancer:Sister   Family History of Heart Disease: Brother  No FH of Colon Cancer:  Social History: Reviewed history from 06/07/2009 and no changes required. Occupation: retired Patient has never smoked.  Alcohol Use - no Illicit Drug Use - no Patient does not get regular exercise.  Married Passive smoker - children smoke in house. Exposed to their smoke for last 20-30 years  Vital Signs:  Patient profile:   77 year old female Height:      63 inches Weight:      212 pounds Pulse rate:   72 / minute BP sitting:   132 / 68  (left arm) Cuff size:  large  Vitals Entered By: Burnett Kanaris, CNA (February 20, 2010 3:58 PM)  Physical Exam  Additional Exam:  Pateint is obese 77 year old woman  in NAD. HEENT:  Normocephalic, atraumatic. EOMI, PERRLA.  Neck: JVP is normal. No thyromegaly. No bruits.  Lungs: clear to auscultation. No rales no wheezes.  Heart: Regular rate and rhythm. Normal S1, S2. No S3.   No significant murmurs. PMI not displaced.  Abdomen:  Supple, nontender. Normal  bowel sounds. No masses. No hepatomegaly.  Extremities:   Good distal pulses throughout. No lower extremity edema.  Musculoskeletal :moving all extremities.  Neuro:   alert and oriented x3.    EKG  Procedure date:  02/20/2010  Findings:      NSR.  64 bpm.  Frist degree AVBlock.  Occasional PAC.   RBBB.  Impression & Recommendations:  Problem # 1:  CHEST PAIN-PAINFUL RESPIRATION (ICD-786.52) Recent pains do not appear to be cardia.  Now gone.  Myoview is without ischemia.  Problem # 2:  HYPERTENSION (ICD-401.9) Good control. Her updated medication list for this problem includes:    Amlodipine Besylate 2.5 Mg Tabs (Amlodipine besylate) .Marland Kitchen... Daily    Bayer Aspirin Ec Low Dose 81 Mg Tbec (Aspirin) ..... One tablet by mouth once daily  Problem # 3:  HYPERLIPIDEMIA (ICD-272.4) Keep on same regimen. Her updated medication list for this problem includes:    Crestor 20 Mg Tabs (Rosuvastatin calcium) .Marland Kitchen... 1  tablet by mouth once daily  Other Orders: Echocardiogram (Echo)  Patient Instructions: 1)  Your physician wants you to follow-up in: MARCH 2012  You will receive a reminder letter in the mail two months in advance. If you don't receive a letter, please call our office to schedule the follow-up appointment.

## 2010-08-22 NOTE — Progress Notes (Signed)
Summary: Holter Monitor/12/14/2009  Phone Note Outgoing Call   Summary of Call: Holter monitor results  Follow-up for Phone Call        Called patient with results of Holter monitor from 12/14/2009 and advised that she was in NSR with an average HR 86 with occ. PAC's per Dr.Ross. Patient is still complaining about pain under rib cage radiating to breast....worse when she is resting in bed. Advised her that Dr.Ross was aware of this at her last visit and did not feel that it was cardiac in origin. Advised her to follow up with Dr.Spears concerning the rib pain. Follow-up by: Suzan Garibaldi RN

## 2010-08-22 NOTE — Assessment & Plan Note (Signed)
Summary: rov/apc   Visit Type:  Follow-up Copy to:  n/a Primary Provider/Referring Provider:  Herb Grays MD  CC:  One month follow-up.  Pt is going to pulmonary rehab. Pt used dulera sample but once that was gone and she didi not call for refills. Pt has not used dulera in 1 month. .  History of Present Illness: Passivve smoker. Followup RUL 4mm nodule on CT 06/01/2009, Gold Stage 1 COPD (strated empiric dulera 06/17/2009), mulifactorial Class 2-3 dyspnea (COPD, Obesity, Deconditioning in context of normal cardiac cath march 2009) and chronic Rt pleuritic musculoskeletal chest pain (normal VQ, normal non contrast CT chest Nov 2010),   OV 08/19/2009: She is now following to see how dyspnea responded to empiric dulera and pulmonary rehab. She states that dulera did not help but rehab has helped immensely. She is not dyspneic for her ADLs now and feels she can do 'almost anything'. Can climb steps etc, wihtout problems. However, she beleives she has to pay $5k for rehab and is unable to afford it. She wants to opt out. Otherwise, feels well. The rt sided chest pain has resolved. There are no other complaints. OV 06/17/2009: Followup RUL 4mm nodule, Rt pleuritic chest pain (with negative non contrast CT chest 06/01/2009), and exertional dyspnea in woman who is a passive smoker and has CAD. She is following up after tests for pleuritic pain and exertional dyspnea.Those symptoms continue.  In interim, she has developed for past 5 days, increased cough, cold, runny nose, wheezing without fever, chest pain, colored sputum, or worsening dyspnea on exertion or worsening pleuritic chest pain. Denies sick contacts. No other complaints.   Current Medications (verified): 1)  Caltrate 600+d 600-400 Mg-Unit  Tabs (Calcium Carbonate-Vitamin D) .... One By Mouth Two Times A Day 2)  Nitrostat 0.4 Mg  Subl (Nitroglycerin) .... By Mouth Sub Lingual As Needed 3)  Amlodipine Besylate 5 Mg Tabs (Amlodipine Besylate) ....  1/2  Tablet By Mouth Once Daily 4)  Bayer Aspirin Ec Low Dose 81 Mg  Tbec (Aspirin) .... One Tablet By Mouth Once Daily 5)  Crestor 20 Mg Tabs (Rosuvastatin Calcium) .... One Tablet By Mouth Once Daily 6)  Glucotrol Xl 5 Mg Xr24h-Tab (Glipizide) .... One Tablet By Mouth Once Daily 7)  Fosamax 70 Mg Tabs (Alendronate Sodium) .... One Tablet By Mouth Weekly 8)  Gabapentin 300 Mg Caps (Gabapentin) .Marland Kitchen.. 1 Tab By Mouth Three Times A Day 9)  Centrum Silver  Tabs (Multiple Vitamins-Minerals) .... One Tablet By Mouth Once Daily 10)  Fish Oil   Oil (Fish Oil) .Marland Kitchen.. 1 Tab By Mouth Once Daily  Allergies (verified): 1)  ! Amoxicillin (Amoxicillin)  Past History:  Past Medical History: Last updated: 06/07/2009 Allergic rhinitis Anemia-NOS chronic disease. Hgb 11.6gm% 04/14/2009 Coronary artery disease Diabetes mellitus, type II Diverticulosis, colon GERD Hyperlipidemia Hypertension DM neuropathy IBS Osteoarthritis Spinal stenois, djd lumbar Multinodular goiter Normal VQ scan for PE workup - April 2009 orthostatic hypotension Obesity  bMI 37 S/p Acute Renal failure due to dehydration - Sept 2009. Normal creat 1.2 on fu 05/13/2009  Past Surgical History: Last updated: 01/21/2009 Cholecystectomy Hysterectomy Sinus surgery Bilateral arthroscopic knee surgery sinus surgeryx2 Stent surgery  Family History: Last updated: 01/21/2009 Family History of Breast Cancer:Sister   Family History of Heart Disease: Brother  No FH of Colon Cancer:  Social History: Last updated: 06/07/2009 Occupation: retired Patient has never smoked.  Alcohol Use - no Illicit Drug Use - no Patient does not get regular exercise.  Married  Passive smoker - children smoke in house. Exposed to their smoke for last 20-30 years  Risk Factors: Exercise: no (12/03/2007)  Risk Factors: Smoking Status: never (12/03/2007)  Family History: Reviewed history from 01/21/2009 and no changes required. Family History  of Breast Cancer:Sister   Family History of Heart Disease: Brother  No FH of Colon Cancer:  Social History: Reviewed history from 06/07/2009 and no changes required. Occupation: retired Patient has never smoked.  Alcohol Use - no Illicit Drug Use - no Patient does not get regular exercise.  Married Passive smoker - children smoke in house. Exposed to their smoke for last 20-30 years  Review of Systems  The patient denies anorexia, fever, weight loss, weight gain, vision loss, decreased hearing, hoarseness, chest pain, syncope, dyspnea on exertion, peripheral edema, prolonged cough, headaches, hemoptysis, abdominal pain, melena, hematochezia, severe indigestion/heartburn, hematuria, incontinence, genital sores, muscle weakness, suspicious skin lesions, transient blindness, difficulty walking, depression, unusual weight change, abnormal bleeding, enlarged lymph nodes, angioedema, breast masses, and testicular masses.    Vital Signs:  Patient profile:   77 year old female Height:      63 inches Weight:      214.38 pounds O2 Sat:      99 % on Room air Temp:     97.9 degrees F oral Pulse rate:   64 / minute BP sitting:   130 / 72  (right arm) Cuff size:   regular  Vitals Entered By: Carron Curie CMA (August 19, 2009 9:59 AM)  O2 Flow:  Room air CC: One month follow-up.  Pt is going to pulmonary rehab. Pt used dulera sample but once that was gone, she didi not call for refills. Pt has not used dulera in 1 month.  Comments Medications reviewed with patient Daytime phone number verified with patient. Carron Curie CMA  August 19, 2009 10:00 AM    Physical Exam  General:  well developed, well nourished, in no acute distressobese.   Head:  normocephalic and atraumatic Eyes:  PERRLA/EOM intact; conjunctiva and sclera clear Ears:  TMs intact and clear with normal canals Nose:  no deformity, discharge, inflammation, or lesions Mouth:  no deformity or lesionsMelampatti  Class III.  Dentures + Neck:  no masses, thyromegaly, or abnormal cervical nodes Chest Wall:  no deformities noted Lungs:  clear bilaterally to auscultation and percussion Heart:  regular rate and rhythm, S1, S2 without murmurs, rubs, gallops, or clicks Abdomen:  bowel sounds positive; abdomen soft and non-tender without masses, or organomegaly Msk:  no deformity or scoliosis noted with normal posture Pulses:  pulses normal Extremities:  no clubbing, cyanosis, edema, or deformity noted Neurologic:  CN II-XII grossly intact with normal reflexes, coordination, muscle strength and tone Skin:  intact without lesions or rashes Cervical Nodes:  no significant adenopathy Axillary Nodes:  no significant adenopathy Psych:  alert and cooperative; normal mood and affect; normal attention span and concentration   Impression & Recommendations:  Problem # 1:  PULMONARY NODULE, RIGHT UPPER LOBE (ICD-518.89) Assessment Unchanged  Orders: Radiology Referral (Radiology) Est. Patient Level IV ([address])  4mm ndoule on CT 06/01/2009. Passive smoker  plan repeat ct 05/2010  Problem # 2:  SHORTNESS OF BREATH (SOB) (ICD-786.05) Assessment: Improved  I am inclined to believe more now that dyspnea is mainly due to obesity and deconditioning given the tremondous irmpovement iwth rehab she has had. The COPD (Gold stage 1) is only playing a small part due to the fact it is stage 1 and she  did not feel better with dulera. I have advocated her very strongly to continue rehab if she can afford it. I will find out from rehab folks what the financial issues are.  Orders: Est. Patient Level IV ([address])  Problem # 3:  C O P D (ICD-496) Assessment: Improved Gold stage 1  plan no need for dulera (did not respond to it) albuterol as needed rov Nov 2011  Problem # 4:  CHEST PAIN-PAINFUL RESPIRATION (NAT-557.32) Assessment: Improved  This has resolved  plan monitor Her updated medication list for this  problem includes:    Nitrostat 0.4 Mg Subl (Nitroglycerin) ..... By mouth sub lingual as needed    Bayer Aspirin Ec Low Dose 81 Mg Tbec (Aspirin) ..... One tablet by mouth once daily  Orders: Est. Patient Level IV ([address])  Medications Added to Medication List This Visit: 1)  Proair Hfa 108 (90 Base) Mcg/act Aers (Albuterol sulfate) .Marland Kitchen.. 1-2 puffs every 4-6 hours as needed  Patient Instructions: 1)  I wil contact rehab to see what the financial issues with your rehab are 2)  Take albuterol as needed 2 puff for shortness of breath 3)  I would advise you to continue and finish rehab if you can afford it 4)  REturn to see me in Nov 2011 with CT chest for your nodule 5)  Return sooner if any new problems Prescriptions: PROAIR HFA 108 (90 BASE) MCG/ACT  AERS (ALBUTEROL SULFATE) 1-2 puffs every 4-6 hours as needed  #1 x 6   Entered and Authorized by:   Kalman Shan MD   Signed by:   Kalman Shan MD on 08/19/2009   Method used:   Electronically to        [facility]* (retail)       [address]       [place], [place]  [address]       Ph: [phone]       Fax: [phone]   RxID:   [id number]    Immunization History:  Influenza Immunization History:    Influenza:  fluvax 3+ (04/27/2009)  Pneumovax Immunization History:    Pneumovax:  pneumovax (07/30/2007)

## 2010-08-22 NOTE — Progress Notes (Signed)
Summary: chest tightness/pains  Phone Note Call from Patient Call back at Home Phone [phone]   Caller: Patient Reason for Call: Talk to Nurse Summary of Call: having chest tightness/pains... feels like indigestion  BP 142/102 pulse 59 Initial call taken by: Migdalia Dk,  January 27, 2010 1:27 PM  Follow-up for Phone Call        I spoke with the pt and she is having active tightness and pain in her chest.  The pt said she has also had indigestion.  The pt's symptoms started yesterday afternoon and are coming and going.  Her pain is in the left side of her chest.  She rates her pain as 5/10 at this time.  The pt denies N & V, but does have some SOB.  The pt has taken one NTG and this did help her pain.  I advised the pt that she should go to the ER for evaluation.  I recommended EMS but the pt will have someone drive her to the ER.   ER notification faxed.  Trish paged.    Follow-up by: Julieta Gutting, RN, BSN,  January 27, 2010 1:38 PM

## 2010-08-22 NOTE — Assessment & Plan Note (Signed)
Summary: rov/kfw   Referring Provider:  n/a Primary Provider:  Herb Grays MD   History of Present Illness: Patient is a 77 year old with a histoyr of CAD, hypertension and orthosatic hypotension.  She aslo has a history of deconditioning and has undergone pulm rehab. I saw her last in November, 2010.  Since seen, she says she is doing much better since doing pulm rehab.  She has only rare spells of dizziness.  She had one a couple days ago while bending to tie her shoe.  No chest pain.  Breathing is better.  Current Medications (verified): 1)  Caltrate 600+d 600-400 Mg-Unit  Tabs (Calcium Carbonate-Vitamin D) .... One By Mouth Two Times A Day 2)  Nitrostat 0.4 Mg  Subl (Nitroglycerin) .... By Mouth Sub Lingual As Needed 3)  Amlodipine Besylate 5 Mg Tabs (Amlodipine Besylate) .... 1/2  Tablet By Mouth Once Daily 4)  Bayer Aspirin Ec Low Dose 81 Mg  Tbec (Aspirin) .... One Tablet By Mouth Once Daily 5)  Crestor 20 Mg Tabs (Rosuvastatin Calcium) .... One Tablet By Mouth Once Daily 6)  Glucotrol Xl 5 Mg Xr24h-Tab (Glipizide) .... One Tablet By Mouth Once Daily 7)  Fosamax 70 Mg Tabs (Alendronate Sodium) .... One Tablet By Mouth Weekly 8)  Gabapentin 300 Mg Caps (Gabapentin) .Marland Kitchen.. 1 Tab By Mouth Three Times A Day 9)  Centrum Silver  Tabs (Multiple Vitamins-Minerals) .... One Tablet By Mouth Once Daily 10)  Fish Oil   Oil (Fish Oil) .Marland Kitchen.. 1 Tab By Mouth Once Daily 11)  Proair Hfa 108 (90 Base) Mcg/act  Aers (Albuterol Sulfate) .Marland Kitchen.. 1-2 Puffs Every 4-6 Hours As Needed  Allergies (verified): 1)  ! Amoxicillin (Amoxicillin)  Past History:  Past Medical History: Last updated: 06/07/2009 Allergic rhinitis Anemia-NOS chronic disease. Hgb 11.6gm% 04/14/2009 Coronary artery disease Diabetes mellitus, type II Diverticulosis, colon GERD Hyperlipidemia Hypertension DM neuropathy IBS Osteoarthritis Spinal stenois, djd lumbar Multinodular goiter Normal VQ scan for PE workup - April  2009 orthostatic hypotension Obesity  bMI 37 S/p Acute Renal failure due to dehydration - Sept 2009. Normal creat 1.2 on fu 05/13/2009  Past Surgical History: Last updated: 01/21/2009 Cholecystectomy Hysterectomy Sinus surgery Bilateral arthroscopic knee surgery sinus surgeryx2 Stent surgery  Family History: Last updated: 01/21/2009 Family History of Breast Cancer:Sister   Family History of Heart Disease: Brother  No FH of Colon Cancer:  Social History: Last updated: 06/07/2009 Occupation: retired Patient has never smoked.  Alcohol Use - no Illicit Drug Use - no Patient does not get regular exercise.  Married Passive smoker - children smoke in house. Exposed to their smoke for last 20-30 years  Vital Signs:  Patient profile:   77 year old female Height:      63 inches Weight:      210 pounds Pulse rate:   67 / minute Pulse (ortho):   65 / minute Resp:     16 per minute BP sitting:   143 / 70  (right arm) BP standing:   130 / 76  Vitals Entered By: Marrion Coy, CNA (August 25, 2009 8:36 AM)  Serial Vital Signs/Assessments:  Time      Position  BP       Pulse  Resp  Temp     By           Lying RA  143/92   68                    Christy  Tuck, CNA           Sitting   131/77   55                    Marrion Coy, CNA           Standing  130/76   [address] CNA           Standing  133/76   73                    Marrion Coy, CNA           Standing  143/81   68                    [employer], CNA  Comments: Lying- No complaints Sititng- No complaints Standing- No complaints By: Marrion Coy, CNA  [place] 2 Min- No complaints By: Marrion Coy, CNA  Standing 5 min- No complaints By: Marrion Coy, CNA    Physical Exam  General:  Well developed, well nourished, in no acute distress. Head:  normocephalic and atraumatic Neck:  JVP is normal.  No bruits. Lungs:  CTA.  No rales or wheezes. Heart:  RRR.  S1, S2.  No S3.   Abdomen:   Supple.  Normal bowel sounds. Extremities:  Trivial edema.   EKG  Procedure date:  08/25/2009  Findings:      NSR.  67 bpm.  First degree AV block.  Impression & Recommendations:  Problem # 1:  POSTURAL HYPOTENSION (ICD-458.0) Not orthostatic on exam today.  Feeling much better.  I have encouraged her to make sure shee stays hydrated.  Problem # 2:  SHORTNESS OF BREATH (SOB) (ICD-786.05) Much better sinece she went through pulm rehab.  I have encouraged her to stay active.  Problem # 3:  CORONARY ARTERY DISEASE, S/P PTCA, ON PLAVIX AND ASA (ICD-414.9) No symptoms to suggest angina.  Keep on medical regimen.  Problem # 4:  HYPERLIPIDEMIA (ICD-272.4) Continue on crestor.  Will get labs from Dr. Yehuda Budd office.  Other Orders: EKG w/ Interpretation (93000)  Appended Document: rov/kfw LM with Dr.Spears office to fax Lipid panel to [place].  Appended Document: rov/kfw Called Dr.Spear's office again and requested Lipid Panel to be faxed here.

## 2010-08-22 NOTE — Assessment & Plan Note (Signed)
Summary: rov//lmr   Visit Type:  Follow-up Copy to:  n/a Primary [name]/Referring Dannel Rafter:  Herb Grays MD  CC:  Pt here for follow-up. Pt c/o hoarseness, chest congestion, and dry cough x 3 days. Marland Kitchen  History of Present Illness: Passivve smoker. Followup RUL 4mm nodule on CT 06/01/2009, Gold Stage 1 COPD (failed empiric dulera 06/17/2009, s/p pulm rehab with success), mulifactorial Class 2-3 dyspnea (COPD, Obesity, Deconditioning in context of normal cardiac cath march 2009) and chronic Rt pleuritic musculoskeletal chest pain (normal VQ, normal non contrast CT chest Nov 2010),    June 20, 2010: Followup for above. Main c/o today is the 1 year chronic right infra-axillary musculoskeletalpain. Made worse lying on that side, at nigt and deep inspiration. Made better in day time. Moderate in intensity. No radiation. PMD reportedly referred her to ortho but she wants my opinion on it. Otherwise, last several days increased runny nose with green discharge asspoicated with sore throat. Denies fever, wheeze, increased dyspnea, edema. In terms of COPD - using pro-air occassionally only. Not gong to rehab due to $ issues. Doing some exercise at home. Denies hemoptysis, worsenign edema, orthopena, pnd, fever, chills, chest sputum, sycnope etc.,  OV 06/17/2009: Followup RUL 4mm nodule, Rt pleuritic chest pain (with negative non contrast CT chest 06/01/2009), and exertional dyspnea in woman who is a passive smoker and has CAD. She is following up after tests for pleuritic pain and exertional dyspnea.Those symptoms continue.  In interim, she has developed for past 5 days, increased cough, cold, runny nose, wheezing without fever, chest pain, colored sputum, or worsening dyspnea on exertion or worsening pleuritic chest pain. Denies sick contacts. No other complaints.   Preventive Screening-Counseling & Management  Alcohol-Tobacco     Smoking Status: never  Caffeine-Diet-Exercise     Does Patient  Exercise: no  Current Medications (verified): 1)  Caltrate 600+d 600-400 Mg-Unit  Tabs (Calcium Carbonate-Vitamin D) .... One By Mouth Two Times A Day 2)  Nitrostat 0.4 Mg  Subl (Nitroglycerin) .... By Mouth Sub Lingual As Needed 3)  Amlodipine Besylate 2.5 Mg Tabs (Amlodipine Besylate) .... Daily 4)  Bayer Aspirin Ec Low Dose 81 Mg  Tbec (Aspirin) .... One Tablet By Mouth Once Daily 5)  Crestor 20 Mg Tabs (Rosuvastatin Calcium) .Marland Kitchen.. 1  Tablet By Mouth Once Daily 6)  Glucotrol 5 Mg Tabs (Glipizide) .Marland Kitchen.. 1 Tab Qd 7)  Fosamax 70 Mg Tabs (Alendronate Sodium) .... One Tablet By Mouth Weekly 8)  Gabapentin 300 Mg Caps (Gabapentin) .... 2 Tabs Am- 1 Tab Pm- 3tabs At Bedtime 9)  Centrum Silver  Tabs (Multiple Vitamins-Minerals) .... One Tablet By Mouth Once Daily 10)  Fish Oil   Oil (Fish Oil) .Marland Kitchen.. 1 Tab By Mouth Once Daily 11)  Protonix 40 Mg Tbec (Pantoprazole Sodium) .Marland Kitchen.. 1 Tab Once Daily 12)  Artificial Tears  Soln (Artificial Tear Solution) .Marland Kitchen.. 1 Drop Both Eyes Two Times A Day As Needed 13)  Optivar 0.05 % Soln (Azelastine Hcl) .Marland Kitchen.. 1 Drop Both Eyes Two Times A Day As Needed 14)  Tylenol Extra Strength 500 Mg Tabs (Acetaminophen) .... 2 Tabs As Directed 15)  Vesicare 5 Mg Tabs (Solifenacin Succinate) .Marland Kitchen.. 1 Tab Qd 16)  Proair Hfa 108 (90 Base) Mcg/act Aers (Albuterol Sulfate) .... 2 Puffs Every 6 Hours As Needed  Allergies (verified): 1)  ! Amoxicillin (Amoxicillin)  Past History:  Past medical, surgical, family and social histories (including risk factors) reviewed, and no changes noted (except as noted below).  Past  Medical History: Reviewed history from 06/07/2009 and no changes required. Allergic rhinitis Anemia-NOS chronic disease. Hgb 11.6gm% 04/14/2009 Coronary artery disease Diabetes mellitus, type II Diverticulosis, colon GERD Hyperlipidemia Hypertension DM neuropathy IBS Osteoarthritis Spinal stenois, djd lumbar Multinodular goiter Normal VQ scan for PE workup -  April 2009 orthostatic hypotension Obesity  bMI 37 S/p Acute Renal failure due to dehydration - Sept 2009. Normal creat 1.2 on fu 05/13/2009  Past Surgical History: Reviewed history from 01/21/2009 and no changes required. Cholecystectomy Hysterectomy Sinus surgery Bilateral arthroscopic knee surgery sinus surgeryx2 Stent surgery  Family History: Reviewed history from 01/21/2009 and no changes required. Family History of Breast Cancer:Sister   Family History of Heart Disease: Brother  No FH of Colon Cancer:  Social History: Reviewed history from 06/07/2009 and no changes required. Occupation: retired Patient has never smoked.  Alcohol Use - no Illicit Drug Use - no Patient does not get regular exercise.  Married Passive smoker - children smoke in house. Exposed to their smoke for last 20-30 years  Review of Systems       The patient complains of non-productive cough.  The patient denies shortness of breath with activity, shortness of breath at rest, productive cough, coughing up blood, chest pain, irregular heartbeats, acid heartburn, indigestion, loss of appetite, weight change, abdominal pain, difficulty swallowing, sore throat, tooth/dental problems, headaches, nasal congestion/difficulty breathing through nose, sneezing, itching, ear ache, anxiety, depression, hand/feet swelling, joint stiffness or pain, rash, change in color of mucus, and fever.    Vital Signs:  Patient profile:   77 year old female Height:      63 inches Weight:      212.50 pounds BMI:     37.78 O2 Sat:      94 % on Room air Temp:     97.8 degrees F oral Pulse rate:   81 / minute BP sitting:   122 / 82  (right arm) Cuff size:   regular  Vitals Entered By: Carron Curie CMA (June 20, 2010 11:22 AM)  O2 Flow:  Room air CC: Pt here for follow-up. Pt c/o hoarseness, chest congestion, dry cough x 3 days.  Comments Medications reviewed with patient Carron Curie CMA  June 20, 2010  11:23 AM Daytime phone number verified with patient.    Physical Exam  General:  well developed, well nourished, in no acute distressobese.   Head:  normocephalic and atraumatic Eyes:  PERRLA/EOM intact; conjunctiva and sclera clear Ears:  TMs intact and clear with normal canals Nose:  no deformity, discharge, inflammation, or lesions Mouth:  no deformity or lesionsMelampatti Class III.  Dentures + Neck:  no masses, thyromegaly, or abnormal cervical nodes Chest Wall:  no deformities noted reprorts mild tenderness to palpation of rt chest Lungs:  clear bilaterally to auscultation and percussion Heart:  regular rate and rhythm, S1, S2 without murmurs, rubs, gallops, or clicks Abdomen:  bowel sounds positive; abdomen soft and non-tender without masses, or organomegaly Msk:  no deformity or scoliosis noted with normal posture Pulses:  pulses normal Extremities:  no clubbing, cyanosis, edema, or deformity noted Neurologic:  CN II-XII grossly intact with normal reflexes, coordination, muscle strength and tone Skin:  intact without lesions or rashes Cervical Nodes:  no significant adenopathy Axillary Nodes:  no significant adenopathy Psych:  alert and cooperative; normal mood and affect; normal attention span and concentration   CT of Chest  Procedure date:  05/31/2010  Findings:      RUL nodule is smaller no  other abnormality  Comments:      indepdently reviewed  Impression & Recommendations:  Problem # 1:  PULMONARY NODULE, RIGHT UPPER LOBE (ICD-518.89) Assessment Improved 1 year fu ct ches in nov 2011 shows improvement  plan no further fu Orders: T- * Misc. Laboratory test [phone]) Est. Patient Level IV (47829)  Problem # 2:  C O P D (ICD-496) Assessment: Unchanged stable gold stage 1 copd.  plan pro-air as needed check alpha 1 antityrpsin  home exercise advised becuase she cannot afford rehab  Problem # 3:  CHEST PAIN-PAINFUL RESPIRATION  (FAO-130.86) Assessment: Deteriorated I really think this is muculoskeletal but given her insistence and persistence will check d-diemr. If this is normal, defer back to PMD   T  Problem # 4:  ALLERGIC RHINITIS CAUSE UNSPECIFIED (ICD-477.9) Assessment: New  Mild wiht green discharge but wihtout evidence of sinusitis or AECOPD  plan 6 day doxycycline  Orders: Est. Patient Level IV (57846)  Medications Added to Medication List This Visit: 1)  Proair Hfa 108 (90 Base) Mcg/act Aers (Albuterol sulfate) .... 2 puffs every 6 hours as needed 2)  Doxycycline Monohydrate 100 Mg Caps (Doxycycline monohydrate) .... By mouth twice daily after meals  Other Orders: T-D-Dimer Fibrin Derivatives Quantitive [phone])  Patient Instructions: 1)  take doxycycline 100mg  by mouth two times a day x 6 days 2)  give blood test for d-dimer - will call you with results 3)  give blood test for genetic cause of copd 4)  we will call you with results 5)  return in 12 months 6)  no more need for ct scan chest 7)  use pro-air as needed Prescriptions: DOXYCYCLINE MONOHYDRATE 100 MG  CAPS (DOXYCYCLINE MONOHYDRATE) By mouth twice daily after meals  #12 x 0   Entered by:   Carron Curie CMA   Authorized by:   Kalman Shan MD   Signed by:   Carron Curie CMA on 06/20/2010   Method used:   Electronically to        [facility]  [place] [address] (retail)       [address] [place] [address]       [place], [place]  [address]       Ph: [phone] or [phone]       Fax: [phone]   RxID:   [id number] DOXYCYCLINE MONOHYDRATE 100 MG  CAPS (DOXYCYCLINE MONOHYDRATE) By mouth twice daily after meals  #12 x 0   Entered and Authorized by:   Kalman Shan MD   Signed by:   Kalman Shan MD on 06/20/2010   Method used:   Electronically to        [facility]* (retail)       [address]       [place], [place]  [address]       Ph: [phone]       Fax: [phone]   RxID:    [id number]    Immunization History:  Influenza Immunization History:    Influenza:  historical (05/08/2010)

## 2010-08-22 NOTE — Progress Notes (Signed)
Summary: needs VQ  Phone Note Outgoing Call   Summary of Call: Candise Bowens, D-dimer is high. I am ordering VQ Scan Initial call taken by: Kalman Shan MD,  June 21, 2010 11:16 AM  Follow-up for Phone Call        VQ scan ordered for 06-22-10. pt aware.Carron Curie CMA  June 22, 2010 10:57 AM

## 2010-08-22 NOTE — Letter (Signed)
Summary: [facility]  [facility]   Imported By: Marylou Mccoy 02/14/2010 09:37:43  _____________________________________________________________________  External Attachment:    Type:   Image     Comment:   External Document

## 2010-08-22 NOTE — Progress Notes (Signed)
Summary: ct results-PT CALLED X 2   Phone Note Call from Patient Call back at Home Phone [phone]   Caller: Patient Call For: ramaswamy Reason for Call: Lab or Test Results Summary of Call: Wants results of chest ct. Initial call taken by: Darletta Moll,  June 01, 2010 2:00 PM  Follow-up for Phone Call        Spoke with pt and notified that results are in but unsigned and MR out of the office until 06/02/10. Pt aware once he reviews results we will give her a call and is fine with this. Follow-up by: Vernie Murders,  June 01, 2010 2:28 PM  Additional Follow-up for Phone Call Additional follow up Details #1::        PT CALLED AGAIN RE: RESULTS. I EXPLAINED THAT MR HAS THE MSG AND WILL CALL WHEN HE CAN TODAY. Tivis Ringer, CNA  June 02, 2010 12:23 PM    Additional Follow-up for Phone Call Additional follow up Details #2::    nodule is stable versus smaller in 1 year. Given size <21mm and  1 year stablity no need to fu anymore. SHe is asked to make appoitnent and review face to face dyspnea and nodule and other issues. I was out of town yesterday and so could not call back till now. Pleaes apologize Follow-up by: Kalman Shan MD,  June 02, 2010 5:22 PM  Additional Follow-up for Phone Call Additional follow up Details #3:: Details for Additional Follow-up Action Taken: Spoke with pt and notified of the above results/recs per MR.  Pt verbalized udnerstanding. Sched her rov for 06/20/10 at 11:40 am. Additional Follow-up by: Vernie Murders,  June 02, 2010 5:32 PM

## 2010-08-22 NOTE — Miscellaneous (Signed)
  Clinical Lists Changes  Observations: Added new observation of ECHOINTERP:  Study Conclusions    1. Left ventricle: The cavity size was normal. Wall thickness was      increased in a pattern of mild LVH. Systolic function was normal.      The estimated ejection fraction was in the range of 55% to 60%.   2. Left atrium: The atrium was mildly dilated.   3. Atrial septum: Atrial septal aneurysm cannot R/O fenestration   Transthoracic echocardiography. M-mode, complete 2D, spectral   Doppler, and color Doppler. Height: Height: 162.6cm. Height: 64in.   Weight: Weight: 92kg. Weight: 202.4lb. Body mass index: BMI:   34.8kg/m^2. Body surface area: BSA: 2.70m^2. Patient status:   Inpatient. Location: Bedside.    --------------------------------------------------------------------    Prepared and Electronically Authenticated by    Charlton Haws, MD, [facility]   [id number] (04/13/2009 14:11)      Echocardiogram  Procedure date:  04/13/2009  Findings:       Study Conclusions    1. Left ventricle: The cavity size was normal. Wall thickness was      increased in a pattern of mild LVH. Systolic function was normal.      The estimated ejection fraction was in the range of 55% to 60%.   2. Left atrium: The atrium was mildly dilated.   3. Atrial septum: Atrial septal aneurysm cannot R/O fenestration   Transthoracic echocardiography. M-mode, complete 2D, spectral   Doppler, and color Doppler. Height: Height: 162.6cm. Height: 64in.   Weight: Weight: 92kg. Weight: 202.4lb. Body mass index: BMI:   34.8kg/m^2. Body surface area: BSA: 2.61m^2. Patient status:   Inpatient. Location: Bedside.    --------------------------------------------------------------------    Prepared and Electronically Authenticated by    Charlton Haws, MD, [facility]   2010-09-22T16:44:[id number]

## 2010-08-24 ENCOUNTER — Telehealth: Payer: Self-pay | Admitting: Internal Medicine

## 2010-08-24 NOTE — Progress Notes (Signed)
Summary: pt has med question  Phone Note Call from Patient   Caller: Patient [phone] Reason for Call: Talk to Nurse Summary of Call: pt was prescribed celebrex for her back -is it ok for her to take Initial call taken by: Glynda Jaeger,  August 15, 2010 10:13 AM  Follow-up for Phone Call        Patient prescribed Celebrex. Advised her that I do not see any medications that she is on that would cause any potential risk to her while taking this medicine. I did advise her to contact the prescriber if she notices any new side effects from the medicine. Whitney Maeola Sarah RN  August 15, 2010 10:43 AM  Follow-up by: Whitney Maeola Sarah RN,  August 15, 2010 10:43 AM

## 2010-08-24 NOTE — Progress Notes (Signed)
Summary: alpha 1 is MM gene  Phone Note Outgoing Call   Summary of Call: alpha 1 is MM gene. Normal Initial call taken by: Kalman Shan MD,  July 03, 2010 5:30 PM  Follow-up for Phone Call        ATCx2 line busy [facility]. Carron Curie CMA  July 04, 2010 9:04 AM  advised the pt of alpha results and also that I received a refill for doxycycline but pt states this is a mistake she does not need a refill on abx. Carron Curie CMA  July 06, 2010 4:05 PM

## 2010-08-30 NOTE — Progress Notes (Signed)
Summary: blood pressure  Phone Note Call from Patient Call back at Home Phone [phone]   Caller: Patient Reason for Call: Talk to Nurse Summary of Call:  pt would like to talk to [facility] re her blood pressure. pt having issues with her blood pressure. pt states her blood pressure has been low. Initial call taken by: Roe Coombs,  August 24, 2010 11:31 AM  Follow-up for Phone Call        Called patient back, she took her BP today and it was 114/73. No complaints of dizziness. Advised her that this is a normal BP. She will call again if blood pressure has any changes.   Layne Benton, RN, BSN  August 24, 2010 4:06 PM

## 2010-09-07 ENCOUNTER — Ambulatory Visit: Payer: Self-pay | Admitting: Internal Medicine

## 2010-09-08 ENCOUNTER — Inpatient Hospital Stay (HOSPITAL_COMMUNITY): Payer: Medicare Other

## 2010-09-08 ENCOUNTER — Ambulatory Visit (INDEPENDENT_AMBULATORY_CARE_PROVIDER_SITE_OTHER): Payer: Medicare Other | Admitting: Internal Medicine

## 2010-09-08 ENCOUNTER — Inpatient Hospital Stay (HOSPITAL_COMMUNITY)
Admission: AD | Admit: 2010-09-08 | Discharge: 2010-09-10 | DRG: 287 | Disposition: A | Discharge status: Home / Self Care | Payer: Medicare Other | Source: Ambulatory Visit | Attending: Internal Medicine | Admitting: Internal Medicine

## 2010-09-08 ENCOUNTER — Encounter: Payer: Self-pay | Admitting: Internal Medicine

## 2010-09-08 ENCOUNTER — Telehealth: Payer: Self-pay | Admitting: Internal Medicine

## 2010-09-08 DIAGNOSIS — E785 Hyperlipidemia, unspecified: Secondary | ICD-10-CM | POA: Diagnosis present

## 2010-09-08 DIAGNOSIS — I2510 Atherosclerotic heart disease of native coronary artery without angina pectoris: Principal | ICD-10-CM | POA: Diagnosis present

## 2010-09-08 DIAGNOSIS — I200 Unstable angina: Secondary | ICD-10-CM | POA: Diagnosis present

## 2010-09-08 DIAGNOSIS — Z86718 Personal history of other venous thrombosis and embolism: Secondary | ICD-10-CM

## 2010-09-08 DIAGNOSIS — R072 Precordial pain: Secondary | ICD-10-CM

## 2010-09-08 DIAGNOSIS — E7800 Pure hypercholesterolemia, unspecified: Secondary | ICD-10-CM

## 2010-09-08 DIAGNOSIS — Z9861 Coronary angioplasty status: Secondary | ICD-10-CM

## 2010-09-08 DIAGNOSIS — D649 Anemia, unspecified: Secondary | ICD-10-CM | POA: Diagnosis present

## 2010-09-08 DIAGNOSIS — E119 Type 2 diabetes mellitus without complications: Secondary | ICD-10-CM | POA: Diagnosis present

## 2010-09-08 DIAGNOSIS — R42 Dizziness and giddiness: Secondary | ICD-10-CM

## 2010-09-08 DIAGNOSIS — I10 Essential (primary) hypertension: Secondary | ICD-10-CM | POA: Diagnosis present

## 2010-09-08 LAB — D-DIMER, QUANTITATIVE: D-Dimer, Quant: 1.23 ug/mL-FEU — ABNORMAL HIGH (ref 0.00–0.48)

## 2010-09-08 LAB — COMPREHENSIVE METABOLIC PANEL
Albumin: 3.9 g/dL (ref 3.5–5.2)
BUN: 15 mg/dL (ref 6–23)
Creatinine, Ser: 1.11 mg/dL (ref 0.4–1.2)
Total Protein: 6.8 g/dL (ref 6.0–8.3)

## 2010-09-08 LAB — CARDIAC PANEL(CRET KIN+CKTOT+MB+TROPI)
CK, MB: 2 ng/mL (ref 0.3–4.0)
CK, MB: 3 ng/mL (ref 0.3–4.0)
Relative Index: INVALID (ref 0.0–2.5)
Total CK: 87 U/L (ref 7–177)
Total CK: 125 U/L (ref 7–177)
Troponin I: 0.01 ng/mL (ref 0.00–0.06)

## 2010-09-08 LAB — CBC
HCT: 37.6 % (ref 36.0–46.0)
Hemoglobin: 13.3 g/dL (ref 12.0–15.0)
MCV: 89.1 fL (ref 78.0–100.0)
RBC: 4.22 MIL/uL (ref 3.87–5.11)
RDW: 13.3 % (ref 11.5–15.5)
WBC: 8.5 10*3/uL (ref 4.0–10.5)

## 2010-09-08 LAB — GLUCOSE, CAPILLARY
Glucose-Capillary: 140 mg/dL — ABNORMAL HIGH (ref 70–99)
Glucose-Capillary: 128 mg/dL — ABNORMAL HIGH (ref 70–99)

## 2010-09-08 LAB — TSH: TSH: 1.674 u[IU]/mL (ref 0.350–4.500)

## 2010-09-08 LAB — MRSA PCR SCREENING: MRSA by PCR: POSITIVE — AB

## 2010-09-08 LAB — PROTIME-INR: INR: 0.97 (ref 0.00–1.49)

## 2010-09-09 ENCOUNTER — Inpatient Hospital Stay (HOSPITAL_COMMUNITY): Payer: Medicare Other

## 2010-09-09 LAB — CBC
Hemoglobin: 13.8 g/dL (ref 12.0–15.0)
MCH: 31.2 pg (ref 26.0–34.0)
MCHC: 34.7 g/dL (ref 30.0–36.0)
MCV: 90 fL (ref 78.0–100.0)
Platelets: 216 10*3/uL (ref 150–400)

## 2010-09-09 LAB — GLUCOSE, CAPILLARY
Glucose-Capillary: 148 mg/dL — ABNORMAL HIGH (ref 70–99)
Glucose-Capillary: 162 mg/dL — ABNORMAL HIGH (ref 70–99)
Glucose-Capillary: 159 mg/dL — ABNORMAL HIGH (ref 70–99)

## 2010-09-09 LAB — HEPARIN LEVEL (UNFRACTIONATED): Heparin Unfractionated: 0.44 IU/mL (ref 0.30–0.70)

## 2010-09-09 LAB — BASIC METABOLIC PANEL
BUN: 16 mg/dL (ref 6–23)
CO2: 27 mEq/L (ref 19–32)
Calcium: 8.8 mg/dL (ref 8.4–10.5)
Creatinine, Ser: 1.07 mg/dL (ref 0.4–1.2)
GFR calc Af Amer: 59 mL/min — ABNORMAL LOW (ref >60)

## 2010-09-09 LAB — CARDIAC PANEL(CRET KIN+CKTOT+MB+TROPI)
CK, MB: 5.6 ng/mL — ABNORMAL HIGH (ref 0.3–4.0)
Relative Index: 2.4 (ref 0.0–2.5)
Total CK: 238 U/L — ABNORMAL HIGH (ref 7–177)
Troponin I: <0.01 ng/mL (ref 0.00–0.06)

## 2010-09-09 LAB — LIPID PANEL
LDL Cholesterol: 36 mg/dL (ref 0–99)
Total CHOL/HDL Ratio: 1.9 RATIO
VLDL: 17 mg/dL (ref 0–40)

## 2010-09-09 MED ORDER — IOHEXOL 300 MG/ML  SOLN: 80.0000 mL | Freq: Once | INTRAMUSCULAR | Status: AC | PRN

## 2010-09-09 NOTE — Procedures (Signed)
[name], [name]             ACCOUNT NO.:  [id number]  MEDICAL RECORD NO.:  [id number]           PATIENT TYPE:  I  LOCATION:  2924                         FACILITY:  [facility]  PHYSICIAN:  Verne Carrow, MDDATE OF BIRTH:  1924/04/10  DATE OF PROCEDURE:  09/08/2010 DATE OF DISCHARGE:                           CARDIAC CATHETERIZATION   PRIMARY CARDIOLOGIST:  Pricilla Riffle, MD, [facility]  PRIMARY CARE PHYSICIAN:  Tammy R. Collins Scotland, MD  PROCEDURES PERFORMED: 1. Left heart catheterization. 2. Selective coronary angiography. 3. Left ventricular angiogram.  OPERATOR:  Verne Carrow, MD  INDICATIONS:  This is an 77 year old Caucasian female with a history of coronary artery disease, diabetes mellitus, hypertension, and hyperlipidemia who has a Taxus stent in the mid left anterior descending artery.  Catheterization in 2006 showed severe in-stent restenosis of the stent.  Cutting balloon angioplasty was performed.  Her most recent angiography was in 2009 which showed patent stent in the mid left anterior descending artery.  She has had the recent onset of chest discomfort and shortness of breath today.  She also notes not feeling well.  The patient was seen in the office by Dr. Tenny Craw today and she was admitted to the hospital.  Here in the hospital, she has had several episodes of chest pain and some EKG changes with ST-segment depression. Diagnostic catheterization was performed on an urgent basis.  PROCEDURE IN DETAIL:  The patient was brought to the Main Cardiac Catheterization Laboratory after signing informed consent for the procedure.  The right groin was prepped and draped in a sterile fashion. Lidocaine 1% was used for local anesthesia.  A 5-French sheath was inserted into the right femoral artery without difficulty.  Standard diagnostic catheters were used to perform selective coronary angiography.  A pigtail catheter was used to perform a left  ventricular angiogram.  The patient tolerated the procedure well.  She was taken to the holding area in stable condition.  HEMODYNAMIC FINDINGS:  Central aortic pressure 136/75.  Left ventricular pressure 130/11.  Left ventricular end-diastolic pressure 12.  ANGIOGRAPHIC FINDINGS: 1. The left main coronary artery had no obstructive disease. 2. Left anterior descending was a large-caliber vessel that coursed to     the apex.  There was a moderate-sized diagonal branch that had mild     plaque disease.  Beyond this diagonal branch, there was a 40%     stenosis followed by a stent that was patent.  The caliber of the     left anterior descending artery beyond the diagonal branch was     significantly smaller than the proximal vessel.  There were no flow-     limiting lesions noted in this vessel. 3. The circumflex artery gave off an early small-caliber obtuse     marginal branch and a large bifurcating second obtuse marginal     branch.  There was plaque disease, but no flow-limiting lesions. 4. The right coronary artery was a moderate-sized dominant vessel with     serial 30% lesions throughout the proximal and mid vessel. 5. Left ventricular angiogram was performed in the RAO projection and     showed normal left  ventricular systolic function with ejection     fraction of 60%.  The aortic root was not dilated.  IMPRESSION: 1. Single-vessel coronary artery disease. 2. Patent stent in the mid left anterior descending artery. 3. Normal left ventricular systolic function. 4. No enlargement of the aortic root.  RECOMMENDATIONS:  At this point, I do not see a reason for the patient's chest pain in regards to her coronary artery disease.  Her disease appears stable since last catheterization in 2009.  Her D-dimer was positive and because of this she will be treated with heparin for a presumptive diagnosis of pulmonary embolism.  We will plan on performing CT angiography of the chest  tomorrow to rule out pulmonary embolism. She will be monitored closely in the Step-Down ICU tonight.     Verne Carrow, MD     CM/MEDQ  D:  09/08/2010  T:  09/09/2010  Job:  [id number]  cc:   Pricilla Riffle, MD, [facility] Tammy R. Collins Scotland, M.D.  Electronically Signed by Verne Carrow MD on 09/09/2010 05:42:20 PM

## 2010-09-10 DIAGNOSIS — I200 Unstable angina: Secondary | ICD-10-CM

## 2010-09-10 LAB — CBC
HCT: 37 % (ref 36.0–46.0)
MCH: 30 pg (ref 26.0–34.0)
MCHC: 33.8 g/dL (ref 30.0–36.0)
MCV: 88.9 fL (ref 78.0–100.0)
Platelets: 195 10*3/uL (ref 150–400)
RDW: 13.3 % (ref 11.5–15.5)

## 2010-09-13 NOTE — Progress Notes (Signed)
Summary: Pt having SOB  Phone Note Call from Patient Call back at [facility] Phone [phone]   Caller: Patient Summary of Call: Pt having SOB Initial call taken by: Judie Grieve,  September 08, 2010 8:17 AM  Follow-up for Phone Call        Spoke with pt who reports shortness of breath for past 2-3 days. Worst episode was last night. Felt terrible when she got up this morning and was belching alot. Feeling better now but states she is very nervous.  Was up at 3 AM with chest tightness that resolved on own.  No SOB or chest at present time. No swelling or cough.  Will review with Dr. Tenny Craw and call pt back Follow-up by: Dossie Arbour, RN, BSN,  September 08, 2010 8:34 AM  Additional Follow-up for Phone Call Additional follow up Details #1::        Reviewed with Dr. Tenny Craw. She will see pt today at noon. Pt notified. Additional Follow-up by: Dossie Arbour, RN, BSN,  September 08, 2010 9:58 AM

## 2010-09-13 NOTE — Assessment & Plan Note (Signed)
Summary: short of breath   Visit Type:  ov Referring Provider:  n/a Primary Provider:  Herb Grays MD  CC:  shortness of breath, cp, started a Wed, and and has increased.  History of Present Illness: Patient is a 77 year old with a histoyr of CAD (last cath 2009:  LAD with no instent restenosis; minimal CAD elsewhere), hypertension and orthostatic hypotension.  She was recently hospitalized with chest pain to [facility] (01/2010).  She underwent myoview scan prior to D/C.  this showed no ischemia.   She called in today because she wasnt feeling well.  Woke up around 3 AM SOB.  Has had chest tightness.  Has had SOB since earlier in week.  Just doesnt feel well  Also notes pain under R shoulder blade. No fevers.  Some nausea but no vomiting.   Current Medications (verified): 1)  Caltrate 600+d 600-400 Mg-Unit  Tabs (Calcium Carbonate-Vitamin D) .... One By Mouth Two Times A Day 2)  Nitrostat 0.4 Mg  Subl (Nitroglycerin) .... By Mouth Sub Lingual As Needed 3)  Amlodipine Besylate 2.5 Mg Tabs (Amlodipine Besylate) .... Daily 4)  Bayer Aspirin Ec Low Dose 81 Mg  Tbec (Aspirin) .... One Tablet By Mouth Once Daily 5)  Crestor 20 Mg Tabs (Rosuvastatin Calcium) .Marland Kitchen.. 1  Tablet By Mouth Once Daily 6)  Glucotrol 10 Mg Tabs (Glipizide) .... Once Daily 7)  Fosamax 70 Mg Tabs (Alendronate Sodium) .... One Tablet By Mouth Weekly 8)  Gabapentin 300 Mg Caps (Gabapentin) .... 2 Tabs Am- 1 Tab Pm- 3tabs At Bedtime 9)  Centrum Silver  Tabs (Multiple Vitamins-Minerals) .... One Tablet By Mouth Once Daily 10)  Fish Oil   Oil (Fish Oil) .Marland Kitchen.. 1 Tab By Mouth Once Daily 11)  Artificial Tears  Soln (Artificial Tear Solution) .Marland Kitchen.. 1 Drop Both Eyes Two Times A Day As Needed 12)  Tylenol Extra Strength 500 Mg Tabs (Acetaminophen) .... 2 Tabs As Directed 13)  Proair Hfa 108 (90 Base) Mcg/act Aers (Albuterol Sulfate) .... 2 Puffs Every 6 Hours As Needed 14)  Prozac 10 Mg Caps (Fluoxetine Hcl) .... Once Daily 15)   Hydrochlorothiazide 25 Mg Tabs (Hydrochlorothiazide) .... Take One Tablet By Mouth Daily. 16)  Celebrex 200 Mg Caps (Celecoxib) .... Once Daily  Allergies (verified): 1)  ! Amoxicillin (Amoxicillin)  Past History:  Past medical, surgical, family and social histories (including risk factors) reviewed, and no changes noted (except as noted below).  Past Medical History: Reviewed history from 06/07/2009 and no changes required. Allergic rhinitis Anemia-NOS chronic disease. Hgb 11.6gm% 04/14/2009 Coronary artery disease Diabetes mellitus, type II Diverticulosis, colon GERD Hyperlipidemia Hypertension DM neuropathy IBS Osteoarthritis Spinal stenois, djd lumbar Multinodular goiter Normal VQ scan for PE workup - April 2009 orthostatic hypotension Obesity  bMI 37 S/p Acute Renal failure due to dehydration - Sept 2009. Normal creat 1.2 on fu 05/13/2009  Past Surgical History: Reviewed history from 01/21/2009 and no changes required. Cholecystectomy Hysterectomy Sinus surgery Bilateral arthroscopic knee surgery sinus surgeryx2 Stent surgery  Family History: Reviewed history from 01/21/2009 and no changes required. Family History of Breast Cancer:Sister   Family History of Heart Disease: Brother  No FH of Colon Cancer:  Social History: Reviewed history from 06/07/2009 and no changes required. Occupation: retired Patient has never smoked.  Alcohol Use - no Illicit Drug Use - no Patient does not get regular exercise.  Married Passive smoker - children smoke in house. Exposed to their smoke for last 20-30 years  Review of  Systems       All systems reviewed. NEg to the above problem except as noted above.  Vital Signs:  Patient profile:   77 year old female Height:      63 inches Weight:      210 pounds BMI:     37.33 Pulse rate:   72 / minute BP supine:   154 / 57  (right arm) Cuff size:   regular  Vitals Entered By: Caralee Ates CMA (September 08, 2010 12:04  PM)  Physical Exam  Additional Exam:  Patietn is in mld distress.  Not feelng well.  HEENT:  Normocephalic, atraumatic. EOMI, PERRLA.  Neck: JVP is normal. No thyromegaly. No bruits.  Lungs: clear to auscultation. No rales no wheezes.  Back:  tender under R scapula. Heart: Regular rate and rhythm. Normal S1, S2. No S3.   No significant murmurs. PMI not displaced.  Abdomen:  Supple.  Mild tenderness in epigastrum adn also LLQ.Marland Kitchen Normal bowel sounds. No masses. No hepatomegaly.  Extremities:   Good distal pulses throughout. No lower extremity edema.  Musculoskeletal :moving all extremities.  Neuro:   alert and oriented x3.    EKG  Procedure date:  09/08/2010  Findings:      NSR.  First degree AV block.  286 msec.  Impression & Recommendations:  Problem # 1:  SHORTNESS OF BREATH (SOB) (ICD-786.05) Pateint is very uncomfortable.  Writhing some.  Nothiing signif on exam.  Some diffuse tenderness.  Still she has not been like this in past.  Has nausea. Will admit.  Check labs.  Rule out for MI.  Review tests in past  Problem # 2:  CORONARY ARTERY DISEASE, S/P PTCA, ON PLAVIX AND ASA (ICD-414.9) keep on same regimen Her updated medication list for this problem includes:    Nitrostat 0.4 Mg Subl (Nitroglycerin) ..... By mouth sub lingual as needed    Amlodipine Besylate 2.5 Mg Tabs (Amlodipine besylate) .Marland Kitchen... Daily    Bayer Aspirin Ec Low Dose 81 Mg Tbec (Aspirin) ..... One tablet by mouth once daily  Orders: EKG w/ Interpretation (93000)  Problem # 3:  HYPERTENSION (ICD-401.9) Follow.  Appended Document: short of breath Note faxed to [phone]    [phone].

## 2010-09-21 NOTE — Discharge Summary (Signed)
[name], [name]             ACCOUNT NO.:  [id number]  MEDICAL RECORD NO.:  [id number]           PATIENT TYPE:  I  LOCATION:  2924                         FACILITY:  [facility]  PHYSICIAN:  Learta Codding, MD,FACC DATE OF BIRTH:  12/20/1923  DATE OF ADMISSION:  09/08/2010 DATE OF DISCHARGE:  09/10/2010                              DISCHARGE SUMMARY   PRIMARY CARDIOLOGIST:  Pricilla Riffle, MD, [facility]  DISCHARGE DIAGNOSES: 1. Unstable angina pectoris.     a.     Negative cardiac markers.     b.     Status post cardiac catheterization:  Nonobstructive      coronary artery disease, with patent left anterior descending stent      site; ejection fraction 60%.     c.     Abnormal D-dimer:  Negative CT angiogram of chest for      pulmonary embolus. 2. Question sick sinus syndrome.     a.     Idiojunctional escape rhythm, bradycardia, question ectopic      atrial tachycardia, in the setting of nausea.  SECONDARY DIAGNOSES: 1. Single-vessel coronary artery disease.     a.     Status post "cutting balloon" percutaneous transluminal      coronary angioplasty of the left anterior descending, in 2006,      secondary to severe in-stent restenosis. 2. Diabetes mellitus. 3. Hypertension. 4. Dyslipidemia. 5. Chronic anemia. 6. Multinodular goiter. 7. History of acute renal failure.  REASON FOR ADMISSION:  The patient is an 77 year old female, with cardiac history as outlined above, who was admitted directly from the office, per Dr. Tenny Craw, for evaluation of chest pain and abnormal EKG changes.  HOSPITAL COURSE:  The patient ruled out for myocardial infarction with all serial cardiac markers within normal limits.  A D-dimer was also drawn, and abnormal (1.23), but with a followup CT angiogram of the chest which was negative for pulmonary embolus.  Following admission, the patient was taken directly to the cardiac catheterization lab, given the noted abnormal EKG changes, but was found to  have nonobstructive single-vessel coronary artery disease, with continued patency of the mid LAD stent site.  Left ventricular function was normal (EF 60%).  The patient was kept for overnight observation, and cleared for discharge the following morning in hemodynamically stable condition.  Dr. Andee Lineman did indicate possible sick sinus syndrome, with a transient junctional and ectopic atrial tachycardia rhythm, but in the setting of nausea.  She had documented heart rates of less than 40 bpm.  A beta- blocker was not added to her home medication regimen.  The patient was also placed on a short course of Klonopin, at the time of discharge, for treatment of significant anxiety.  Further monitoring and management will be deferred to her primary care physician.  DISCHARGE LABORATORY DATA:  Normal CBC.  OUTSTANDING LABORATORY DATA:  Normal cardiac markers.  D-dimer 1.23. TSH 1.7.  Lipid profile:  Total cholesterol 113, triglyceride 83, HDL 60, and LDL 36.  Potassium 3.5, BUN 15, creatinine 1.1, and normal LFTs on admission.  Followup potassium 3.7.  Admission chest x-ray:  Stable cardiomegaly.  DISPOSITION:  Stable.  FOLLOWUP: 1. Dr. Huston Foley in 2 weeks.  Arrangements to be made through our     office. 2. The patient is to follow up with Dr. Herb Grays in 2 weeks,     arrangements to be made by the patient.  DISCHARGE MEDICATIONS: 1. Coated aspirin 81 mg daily. 2. Amlodipine 2.5 mg daily. 3. Crestor 20 mg daily. 4. Klonopin 0.25 mg b.i.d. 5. Celebrex 200 mg daily. 6. Fish oil one g daily. 7. Fluoxetine 10 mg daily. 8. Fosamax 70 mg q. weekly. 9. Glipizide XL 10 mg daily. 10.Gabapentin 600 mg t.i.d. 11.Hydrochlorothiazide 25 mg daily. 12.Protonix 40 mg daily. 13.Nitrostat 0.4 mg p.r.n.  DURATION OF DISCHARGE ENCOUNTER:  Greater than 30 minutes, including physician time.     Gene Serpe, PA-C   ______________________________ Learta Codding, MD,FACC   GS/MEDQ  D:   09/10/2010  T:  09/11/2010  Job:  [id number]  cc:   Tammy R. Collins Scotland, M.D.  Electronically Signed by Rozell Searing PA-C on 09/12/2010 05:43:07 PM Electronically Signed by Lewayne Bunting MDFACC on 09/21/2010 10:27:42 AM

## 2010-09-22 ENCOUNTER — Ambulatory Visit: Payer: Self-pay | Admitting: Internal Medicine

## 2010-09-25 ENCOUNTER — Telehealth: Payer: Self-pay | Admitting: Internal Medicine

## 2010-09-25 ENCOUNTER — Encounter (INDEPENDENT_AMBULATORY_CARE_PROVIDER_SITE_OTHER): Payer: Medicare Other

## 2010-09-25 DIAGNOSIS — I498 Other specified cardiac arrhythmias: Secondary | ICD-10-CM

## 2010-09-26 ENCOUNTER — Observation Stay (HOSPITAL_COMMUNITY)
Admission: EM | Admit: 2010-09-26 | Discharge: 2010-09-28 | DRG: 690 | Disposition: A | Discharge status: Home / Self Care | Payer: Medicare Other | Source: Ambulatory Visit | Attending: Internal Medicine | Admitting: Internal Medicine

## 2010-09-26 ENCOUNTER — Emergency Department (HOSPITAL_COMMUNITY): Payer: Medicare Other

## 2010-09-26 DIAGNOSIS — B9689 Other specified bacterial agents as the cause of diseases classified elsewhere: Secondary | ICD-10-CM | POA: Insufficient documentation

## 2010-09-26 DIAGNOSIS — E119 Type 2 diabetes mellitus without complications: Secondary | ICD-10-CM | POA: Insufficient documentation

## 2010-09-26 DIAGNOSIS — R42 Dizziness and giddiness: Secondary | ICD-10-CM | POA: Insufficient documentation

## 2010-09-26 DIAGNOSIS — E669 Obesity, unspecified: Secondary | ICD-10-CM | POA: Insufficient documentation

## 2010-09-26 DIAGNOSIS — I10 Essential (primary) hypertension: Secondary | ICD-10-CM | POA: Insufficient documentation

## 2010-09-26 DIAGNOSIS — D649 Anemia, unspecified: Secondary | ICD-10-CM | POA: Insufficient documentation

## 2010-09-26 DIAGNOSIS — I2510 Atherosclerotic heart disease of native coronary artery without angina pectoris: Secondary | ICD-10-CM | POA: Insufficient documentation

## 2010-09-26 DIAGNOSIS — R000 Tachycardia, unspecified: Secondary | ICD-10-CM | POA: Insufficient documentation

## 2010-09-26 DIAGNOSIS — E785 Hyperlipidemia, unspecified: Secondary | ICD-10-CM | POA: Insufficient documentation

## 2010-09-26 DIAGNOSIS — R935 Abnormal findings on diagnostic imaging of other abdominal regions, including retroperitoneum: Secondary | ICD-10-CM | POA: Insufficient documentation

## 2010-09-26 DIAGNOSIS — F411 Generalized anxiety disorder: Secondary | ICD-10-CM | POA: Insufficient documentation

## 2010-09-26 DIAGNOSIS — R002 Palpitations: Secondary | ICD-10-CM | POA: Insufficient documentation

## 2010-09-26 DIAGNOSIS — E860 Dehydration: Secondary | ICD-10-CM | POA: Insufficient documentation

## 2010-09-26 DIAGNOSIS — Z9861 Coronary angioplasty status: Secondary | ICD-10-CM | POA: Insufficient documentation

## 2010-09-26 DIAGNOSIS — N390 Urinary tract infection, site not specified: Secondary | ICD-10-CM | POA: Insufficient documentation

## 2010-09-26 DIAGNOSIS — E042 Nontoxic multinodular goiter: Secondary | ICD-10-CM | POA: Insufficient documentation

## 2010-09-26 DIAGNOSIS — R197 Diarrhea, unspecified: Secondary | ICD-10-CM | POA: Insufficient documentation

## 2010-09-26 DIAGNOSIS — R0789 Other chest pain: Principal | ICD-10-CM | POA: Insufficient documentation

## 2010-09-26 LAB — DIFFERENTIAL
Basophils Relative: 2 % — ABNORMAL HIGH (ref 0–1)
Eosinophils Absolute: 0.2 10*3/uL (ref 0.0–0.7)
Lymphs Abs: 2.5 10*3/uL (ref 0.7–4.0)
Monocytes Absolute: 0.6 10*3/uL (ref 0.1–1.0)
Monocytes Relative: 7 % (ref 3–12)

## 2010-09-26 LAB — URINE MICROSCOPIC-ADD ON

## 2010-09-26 LAB — CBC
MCH: 31 pg (ref 26.0–34.0)
Platelets: 276 10*3/uL (ref 150–400)
RBC: 4.29 MIL/uL (ref 3.87–5.11)
WBC: 7.6 10*3/uL (ref 4.0–10.5)

## 2010-09-26 LAB — BASIC METABOLIC PANEL
BUN: 21 mg/dL (ref 6–23)
Chloride: 101 mEq/L (ref 96–112)
Creatinine, Ser: 1.1 mg/dL (ref 0.4–1.2)

## 2010-09-26 LAB — URINALYSIS, ROUTINE W REFLEX MICROSCOPIC
Glucose, UA: NEGATIVE mg/dL
Hgb urine dipstick: NEGATIVE
Protein, ur: NEGATIVE mg/dL
Specific Gravity, Urine: 1.023 (ref 1.005–1.030)

## 2010-09-26 LAB — POCT CARDIAC MARKERS
CKMB, poc: 1.9 ng/mL (ref 1.0–8.0)
Troponin i, poc: <0.05 ng/mL (ref 0.00–0.09)

## 2010-09-26 LAB — PROTIME-INR: Prothrombin Time: 14.3 seconds (ref 11.6–15.2)

## 2010-09-27 ENCOUNTER — Inpatient Hospital Stay (HOSPITAL_COMMUNITY): Payer: Medicare Other

## 2010-09-27 DIAGNOSIS — R079 Chest pain, unspecified: Secondary | ICD-10-CM

## 2010-09-27 LAB — COMPREHENSIVE METABOLIC PANEL
ALT: 21 U/L (ref 0–35)
Alkaline Phosphatase: 51 U/L (ref 39–117)
BUN: 19 mg/dL (ref 6–23)
Chloride: 94 mEq/L — ABNORMAL LOW (ref 96–112)
Glucose, Bld: 134 mg/dL — ABNORMAL HIGH (ref 70–99)
Potassium: 3.4 mEq/L — ABNORMAL LOW (ref 3.5–5.1)
Total Bilirubin: 0.5 mg/dL (ref 0.3–1.2)

## 2010-09-27 LAB — CBC
MCH: 31.4 pg (ref 26.0–34.0)
MCHC: 34.9 g/dL (ref 30.0–36.0)
Platelets: 246 10*3/uL (ref 150–400)
RDW: 13.4 % (ref 11.5–15.5)

## 2010-09-27 LAB — HEMOGLOBIN A1C
Hgb A1c MFr Bld: 6.6 % — ABNORMAL HIGH (ref <5.7)
Mean Plasma Glucose: 143 mg/dL — ABNORMAL HIGH (ref <117)

## 2010-09-27 LAB — CARDIAC PANEL(CRET KIN+CKTOT+MB+TROPI)
CK, MB: 2.2 ng/mL (ref 0.3–4.0)
Relative Index: INVALID (ref 0.0–2.5)
Relative Index: 1.9 (ref 0.0–2.5)
Total CK: 151 U/L (ref 7–177)

## 2010-09-27 LAB — GLUCOSE, CAPILLARY
Glucose-Capillary: 166 mg/dL — ABNORMAL HIGH (ref 70–99)
Glucose-Capillary: 148 mg/dL — ABNORMAL HIGH (ref 70–99)

## 2010-09-27 LAB — D-DIMER, QUANTITATIVE: D-Dimer, Quant: 1.42 ug/mL-FEU — ABNORMAL HIGH (ref 0.00–0.48)

## 2010-09-27 LAB — MAGNESIUM: Magnesium: 1.6 mg/dL (ref 1.5–2.5)

## 2010-09-27 MED ORDER — TECHNETIUM TO 99M ALBUMIN AGGREGATED
6.0000 | Freq: Once | INTRAVENOUS | Status: AC | PRN
  Administered 2010-09-27: 6 via INTRAVENOUS

## 2010-09-27 MED ORDER — XENON XE 133 GAS
10.0000 | GAS_FOR_INHALATION | Freq: Once | RESPIRATORY_TRACT | Status: AC | PRN
  Administered 2010-09-27: 10 via RESPIRATORY_TRACT

## 2010-09-28 LAB — URINE CULTURE
Colony Count: 50000
Culture  Setup Time: 201203070901

## 2010-09-28 LAB — DIFFERENTIAL
Basophils Absolute: 0.1 10*3/uL (ref 0.0–0.1)
Eosinophils Absolute: 0.3 10*3/uL (ref 0.0–0.7)
Eosinophils Relative: 4 % (ref 0–5)

## 2010-09-28 LAB — CBC
MCV: 91 fL (ref 78.0–100.0)
Platelets: 213 10*3/uL (ref 150–400)
RDW: 13.7 % (ref 11.5–15.5)
WBC: 6.9 10*3/uL (ref 4.0–10.5)

## 2010-09-28 LAB — COMPREHENSIVE METABOLIC PANEL
Albumin: 3.3 g/dL — ABNORMAL LOW (ref 3.5–5.2)
Alkaline Phosphatase: 56 U/L (ref 39–117)
BUN: 14 mg/dL (ref 6–23)
Calcium: 8.4 mg/dL (ref 8.4–10.5)
Potassium: 4 mEq/L (ref 3.5–5.1)
Sodium: 133 mEq/L — ABNORMAL LOW (ref 135–145)
Total Protein: 5.9 g/dL — ABNORMAL LOW (ref 6.0–8.3)

## 2010-09-28 LAB — PHOSPHORUS: Phosphorus: 2.9 mg/dL (ref 2.3–4.6)

## 2010-09-28 LAB — GLUCOSE, CAPILLARY: Glucose-Capillary: 176 mg/dL — ABNORMAL HIGH (ref 70–99)

## 2010-10-03 NOTE — Progress Notes (Signed)
Summary: c/o b./p issues  Phone Note Call from Patient Call back at Home Phone [phone]   Caller: Patient Reason for Call: Talk to Nurse Summary of Call: c/o b/p today 103/62. pulse 114. no chestpain nor sob.  Initial call taken by: Lorne Skeens,  September 25, 2010 8:23 AM  Follow-up for Phone Call        Patient called with the following vital signs: 3/4 BP 127/66 HR 110 3/5 at 730am BP 103/62 HR124 3/5 at 9am BP 142/76 HR119  Advised patient... will discuss with Dr.Amario Longmore and call her back. Layne Benton, RN, BSN  September 25, 2010 9:39 AM     Additional Follow-up for Phone Call Additional follow up Details #1::        Per Dr.Amerigo Mcglory .Marland Kitchen..order a 24 hour holter monitor. Called patient and advised of above. She will come at 3 pm today for the monitor per [facility].  Layne Benton, RN, BSN  September 25, 2010 11:04 AM

## 2010-10-06 NOTE — Consult Note (Signed)
[name], [name]             ACCOUNT NO.:  [id number]  MEDICAL RECORD NO.:  [id number]           PATIENT TYPE:  I  LOCATION:  2004                         FACILITY:  [facility]  PHYSICIAN:  Hillis Range, MD       DATE OF BIRTH:  May 19, 1924  DATE OF CONSULTATION: DATE OF DISCHARGE:                                CONSULTATION   REQUESTING PHYSICIAN:  [facility].  REASON FOR CONSULTATION:  Chest pain.  HISTORY OF PRESENT ILLNESS:  [name] is a pleasant 77 year old female with a history of diabetes, hypertension, hyperlipidemia and coronary artery disease who was admitted after presenting stating that her blood pressure had been "low"and that she was having dizziness.  The patient was recently hospitalized in February for symptoms of chest pain.  She underwent left heart catheterization at that time which revealed a patent stent to the mid LAD with no other obstructive disease.  Medical management was recommended.  The patient wasdischarged.  She reports doing well initially.  She checks her blood pressure 3 times a day and states that when she checked earlier today that her blood pressure was "low"and that her heart rate was in the 120s.  She reports symptoms of dizziness and "just feeling bad."  She therefore presented to [facility] Emergency Room for further evaluation. The patient is unable to further articulate her symptoms.  She appears to have a significant degree of anxiety and reports that she does have occasional chest tightness without shortness of breath, palpitations, presyncope or syncope.  She is followed in the outpatient setting by Dr. Tenny Craw who placed an event monitor earlier in the week.  The results are presently pending.  Presently, she is resting comfortably and is without complaint.  PAST MEDICAL HISTORY: 1. Coronary artery disease status post PCI of the LAD.  Most recent     cath on February 18 revealed patent stent with no acute      obstruction. 2. Diabetes. 3. Hypertension. 4. Hyperlipidemia. 5. Depression. 6. Fibromyalgia. 7. Chronic renal insufficiency. 8. Bradycardia with type 1 second-degree AV block previously     demonstrated 9. Right bundle-branch block. 10.COPD. 11.Obesity. 12.Chronic dyspnea. 13.Preserved ejection fraction.  Medications are reviewed in the [facility].  ALLERGIES:  AMOXICILLIN.  SOCIAL HISTORY:  The patient is retired.  She denies tobacco, alcohol or drug use.  FAMILY HISTORY:  Notable for coronary artery disease.  REVIEW OF SYSTEMS:  All systems are reviewed and negative except as outlined in the HPI above.  Telemetry reveals sinus rhythm with a first-degree AV block and right bundle-branch block.  The patient has had no arrhythmias observed.  PHYSICAL EXAMINATION:  VITAL SIGNS:  Blood pressure 143/96, heart rate 98, respirations 18, sats 95% on room air, afebrile. GENERAL:  The patient is an anxious morbidly obese female in no acute distress.  She is alert and oriented x3. HEENT:  Normocephalic, atraumatic.  Sclerae clear.  Conjunctivae pink.Oropharynx clear. NECK:  Supple.  JVP flat. LUNGS:  Clear. HEART:  Regular rate and rhythm.  No murmurs, rubs or gallops. GI:  Soft, nontender, nondistended.  Positive bowel sounds. EXTREMITIES:  No clubbing, cyanosis  or edema. SKIN:  No ecchymoses or lacerations. MUSCULOSKELETAL:  No deformity or atrophy. PSYCH:  Euthymic mood.  Full affect.  VQ scan performed today reveals a very low probability of PTE.  The patient also had a chest CT on February 18 which revealed no acute process and no pulmonary emboli.  The left heart catheterization as described above.  LABORATORY FINDINGS:  A1c 6.6, TSH 1.36.  Cardiac markers are negative. C. diff negative.  Potassium 3.4, creatinine 1.09, BUN 19, white blood cell count 7.7, hematocrit 37, platelets 246.  Lactate 1.3.  TSH from February 17 is 1.674.  Urinalysis reveals moderate leukocytes but  is otherwise unremarkable.  IMPRESSION:  [name] is a pleasant 77 year old female with a history of single-vessel coronary artery disease with recent cath revealed a patent stent and no significant obstructive disease with a preserved ejection fraction who now presents with symptomatic hypertension.  Her labs and exam are consistent with volume depletion and she appears to have a mild urinary tract infection.  She has been admitted to the medicine service and has been placed on antibiotics as well as IV fluid. She appears to be improving.  She has chest pain which I believe is atypical at this time.  I think that an acute coronary artery syndrome is very unlikely given her recent cath findings and negative cardiac markers.  Her EKG reveals a stable first-degree AV block with a right bundle-branch block and no ischemic changes.  Though the nurse in her note describes atrial fibrillation, I did not see this documented.  I think that she is actually looking at atrial fibrillation with a long first-degree AV block and a P-wave that is hidden within the T-wave.  At this time I would recommend that we proceed according to the current plan as outlined by the medicine team.  I do not feel that further cardiac risk stratification is advised at this time.  Given the patient's first-degree AV block, I think that I would avoid aggressive AV nodal blocking agents. We will continue aspirin.  I think that holding her hydrochlorothiazide is reasonable given her recent dehydration.  We will resume her Crestor and follow the patient with you during the hospital stay.     Hillis Range, MD     JA/MEDQ  D:  09/27/2010  T:  09/28/2010  Job:  [id number]  cc:   Pricilla Riffle, MD, [facility]  Electronically Signed by Hillis Range MD on 10/06/2010 10:57:01 PM

## 2010-10-08 LAB — BASIC METABOLIC PANEL
BUN: 19 mg/dL (ref 6–23)
BUN: 21 mg/dL (ref 6–23)
CO2: 28 mEq/L (ref 19–32)
CO2: 29 mEq/L (ref 19–32)
Calcium: 10.4 mg/dL (ref 8.4–10.5)
Creatinine, Ser: 1.19 mg/dL (ref 0.4–1.2)
GFR calc Af Amer: 52 mL/min — ABNORMAL LOW (ref >60)
GFR calc non Af Amer: 44 mL/min — ABNORMAL LOW (ref >60)
Glucose, Bld: 151 mg/dL — ABNORMAL HIGH (ref 70–99)
Glucose, Bld: 59 mg/dL — ABNORMAL LOW (ref 70–99)
Potassium: 4.1 mEq/L (ref 3.5–5.1)
Sodium: 136 mEq/L (ref 135–145)

## 2010-10-08 LAB — GLUCOSE, CAPILLARY
Glucose-Capillary: 107 mg/dL — ABNORMAL HIGH (ref 70–99)
Glucose-Capillary: 109 mg/dL — ABNORMAL HIGH (ref 70–99)
Glucose-Capillary: 130 mg/dL — ABNORMAL HIGH (ref 70–99)
Glucose-Capillary: 136 mg/dL — ABNORMAL HIGH (ref 70–99)
Glucose-Capillary: 147 mg/dL — ABNORMAL HIGH (ref 70–99)
Glucose-Capillary: 152 mg/dL — ABNORMAL HIGH (ref 70–99)
Glucose-Capillary: 153 mg/dL — ABNORMAL HIGH (ref 70–99)
Glucose-Capillary: 173 mg/dL — ABNORMAL HIGH (ref 70–99)
Glucose-Capillary: 177 mg/dL — ABNORMAL HIGH (ref 70–99)
Glucose-Capillary: 75 mg/dL (ref 70–99)
Glucose-Capillary: 99 mg/dL (ref 70–99)
Glucose-Capillary: 101 mg/dL — ABNORMAL HIGH (ref 70–99)
Glucose-Capillary: 85 mg/dL (ref 70–99)

## 2010-10-08 LAB — CARDIAC PANEL(CRET KIN+CKTOT+MB+TROPI)
CK, MB: 2.1 ng/mL (ref 0.3–4.0)
CK, MB: 2.2 ng/mL (ref 0.3–4.0)
Total CK: 95 U/L (ref 7–177)
Troponin I: 0.01 ng/mL (ref 0.00–0.06)

## 2010-10-08 LAB — HEMOGLOBIN A1C
Hgb A1c MFr Bld: 6 % — ABNORMAL HIGH (ref <5.7)
Mean Plasma Glucose: 126 mg/dL — ABNORMAL HIGH (ref <117)

## 2010-10-08 LAB — DIFFERENTIAL
Basophils Relative: 1 % (ref 0–1)
Eosinophils Absolute: 0.3 10*3/uL (ref 0.0–0.7)
Eosinophils Relative: 4 % (ref 0–5)
Neutrophils Relative %: 56 % (ref 43–77)

## 2010-10-08 LAB — POCT CARDIAC MARKERS: Myoglobin, poc: 148 ng/mL (ref 12–200)

## 2010-10-08 LAB — CBC
HCT: 37.7 % (ref 36.0–46.0)
Hemoglobin: 13.1 g/dL (ref 12.0–15.0)
MCH: 31.8 pg (ref 26.0–34.0)
MCHC: 34.2 g/dL (ref 30.0–36.0)
MCHC: 34.7 g/dL (ref 30.0–36.0)
MCV: 93.2 fL (ref 78.0–100.0)
Platelets: 197 10*3/uL (ref 150–400)
RBC: 4.05 MIL/uL (ref 3.87–5.11)
RDW: 13.2 % (ref 11.5–15.5)
WBC: 5.5 10*3/uL (ref 4.0–10.5)

## 2010-10-08 LAB — LIPID PANEL
Cholesterol: 135 mg/dL (ref 0–200)
HDL: 52 mg/dL (ref >39)
LDL Cholesterol: 40 mg/dL (ref 0–99)
Total CHOL/HDL Ratio: 2.6 RATIO

## 2010-10-08 LAB — TROPONIN I: Troponin I: 0.02 ng/mL (ref 0.00–0.06)

## 2010-10-08 LAB — BRAIN NATRIURETIC PEPTIDE: Pro B Natriuretic peptide (BNP): 92 pg/mL (ref 0.0–100.0)

## 2010-10-09 NOTE — Discharge Summary (Signed)
[name], [name]             ACCOUNT NO.:  [id number]  MEDICAL RECORD NO.:  [id number]           PATIENT TYPE:  I  LOCATION:  2004                         FACILITY:  [facility]  PHYSICIAN:  Rock Nephew, MD       DATE OF BIRTH:  [birth year]  DATE OF ADMISSION:  09/27/2010 DATE OF DISCHARGE:  09/28/2010                        DISCHARGE SUMMARY - REFERRING   PRIMARY CARE PHYSICIAN:  Tammy R. Collins Scotland, M.D.  DISCHARGE DIAGNOSES: 1. Chest pain with palpitations, noncardiac. 2. Urinary tract infection Citrobacter. 3. Type 2 diabetes mellitus. 4. Obesity. 5. Anxiety. 6. History of dyslipidemia. 7. History of multinodular goiter. 8. History of coronary artery disease.  DISCHARGE MEDICATIONS:  For the patient are as follows: 1. Ciprofloxacin 750 mg p.o. daily for 2 days. 2. Artificial tears over-the-counter 1 drop in both eyes twice daily     as needed. 3. Aspirin enteric-coated 81 mg p.o. daily. 4. Celebrex 200 mg 1 tablet by mouth p.o. daily. 5. Calcium citrate with vitamin D 600 mg 200 mg 1 tablet by mouth     twice daily. 6. Crestor 20 mg daily at bedtime. 7. Fish oil 1 gram p.o. daily. 8. Fluoxetine 10 mg 1 capsule by mouth every morning. 9. Fosamax 70 mg 1 tablet by mouth weekly. 10.Glipizide XL 10 mg 1 tablet by mouth every morning. 11.Gabapentin 300 mg 2 tablets by mouth three times day. 12.Hydrochlorothiazide 25 mg 1 tablet p.o. q. Morning. To start 3 days after discharge:  Multivitamins 1 tablet p.o. daily, Norvasc 2.5 mg p.o. daily at bedtime, nitroglycerin 0.4 mg sublingual 1 tablet under tongue every 5 minutes up to three doses as needed for chest pain, ProAir 90 mcg 2 puffs inhaled every 6 hours as needed, Tylenol Extra Strength 500 mg 2 tablets by mouth daily as needed.  DISPOSITION:  The patient is discharged home.  PATIENT'S DIET:  Carbohydrate-modified.  PROCEDURES PERFORMED:  The patient had a chest x-ray which showed chronic changes, no active lung disease.   The patient also had a ventilation-perfusion scan for an elevated D-dimer, which showed very low probability for pulmonary embolism.  CONSULTATIONS:  On this case were Dr. Sherri Rad [facility].  FOLLOW-UP:  The patient should follow up with Dr. Herb Grays within 1 week.  The patient should follow up with Dietrich Pates in about 2-4 weeks.  HISTORY OF PRESENT ILLNESS:  This is an 77 year old female with past medical history of coronary artery disease which she had a cardiac cath in February 2012, which showed patent stents, single-vessel coronary artery disease and there was no obstructive coronary artery disease. The patient came to the hospital for chest pressure and palpitations.  HOSPITAL COURSE: 1. Chest pain, chest pressure, palpitations.  The patient had cardiac     enzymes negative x3.  The patient had an EKG.  The patient has     sinus tachycardia with first degree AV block, most likely secondary     to dehydration.  This patient did not have atrial fibrillation.     Dr. Johney Frame came and saw the patient.  The patient also had a VQ     scan, which  showed a very low probability pulmonary embolism.  The     patient had elevated D-dimer.  The patient also had a CT angiogram     of the chest in February 2012, which showed no PE. 2. Urinary tract infection.  The patient was discovered to have     Citrobacter UTI, which was sensitive to ciprofloxacin.  She     received 2 days of ciprofloxacin in the hospital.  She will receive     two more days of outpatient ciprofloxacin to complete a course. 3. Type 2 diabetes mellitus.  The patient was continued on glipizide.     The patient's hemoglobin A1c was 6.6. 4. Dyslipidemia.  The patient was continued on fish oil  as well as     Crestor. 5. Obesity.  The patient was counseled. 6. Anxiety. The patient's  anxiety was stable. 7. History of coronary artery disease.  The patient was continued on     aspirin as well as Crestor.  She  received SCDs for DVT prophylaxis.     Rock Nephew, MD     NH/MEDQ  D:  09/28/2010  T:  09/28/2010  Job:  [id number]  cc:   Tammy R. Collins Scotland, M.D.  Electronically Signed by Rock Nephew MD on 10/09/2010 09:55:17 PM

## 2010-10-23 NOTE — H&P (Signed)
[name], Shepherd             ACCOUNT NO.:  [id number]  MEDICAL RECORD NO.:  [id number]           PATIENT TYPE:  E  LOCATION:  [facility]                         FACILITY:  [facility]  PHYSICIAN:  Eduard Clos, MDDATE OF BIRTH:  [birth year]  DATE OF ADMISSION:  09/26/2010 DATE OF DISCHARGE:                             HISTORY & PHYSICAL   PRIMARY CARE PHYSICIAN:  Tammy R. Collins Scotland, MD  CHIEF COMPLAINT:  Chest pressure and palpitation.  HISTORY OF PRESENT ILLNESS:  An 77 year old female with known history of CAD status post stenting was recently admitted under cardiac service, and had a cardiac cath on September 09, 2010, and was found to have nothing acute, was discharged home and at that same time, also had a CT angio of chest.  It was negative for PE.  Presented to the ER because the patient had sudden onset of chest tightness, palpitation.  The patient checked her blood pressure at home, was found to be hypotensive with the heart rate around 20 came to the ER.  In the ER, the patient was initially found to be mildly hypotensive in the systolics around 98, which got better with 500 mL bolus, and the patient was found to have a UTI.  At this time, the patient will be admitted for further observation.  In addition, the patient said she had an episode of diarrhea.  She still wants to move once more.  The patient denies any nausea, vomiting, any shortness of breath. Denies any dizziness, loss of consciousness, focal deficit.  She does have some abdominal discomfort initially.  After the bowel movement, it got better, the abdominal discomfort in the epigastric area.  PAST MEDICAL HISTORY:  Diabetes mellitus type 2, hypertension, dyslipidemia, chronic anemia, multinodular goiter, history of acute renal failure, history of single vessel coronary artery disease status post balloon percutaneous transluminal coronary angioplasty in 2006 of the left anterior descending secondary to severe  in-stent restenosis. Recent admission for unstable angina, and had a cardiac cath and did not require any further intervention.  PAST SURGICAL HISTORY:  Cholecystectomy, hysterectomy, arthroscopic knee surgery, and coronary artery stents.  MEDICATIONS PRIOR TO ADMISSION: 1. Celebrex 200 mg p.o. daily. 2. Hydrochlorothiazide 25 mg p.o. daily. 3. Fluoxetine 10 mg p.o. daily. 4. Proair. 5. Tylenol. 6. Artificial tears. 7. Fish oil. 8. Multivitamins. 9. Gabapentin 300 mg 2 tablets 3 times daily. 10.Fosamax. 11.Glipizide XL 10 mg p.o. daily. 12.Crestor 20 mg p.o. daily. 13.Aspirin 81 mg daily. 14.Norvasc 2.5. 15.Nitroglycerin 0.4 mg p.r.n.. 16.Calcium citrate.  ALLERGIES:  AMOXICILLIN.  FAMILY HISTORY:  Significant for coronary artery disease.  SOCIAL HISTORY:  The patient is retired, lives with her husband.  She is full code.  Denies smoking cigarettes, drinking alcohol, use illegal drugs.  REVIEW OF SYSTEMS:  As per history of present illness, nothing else significant.  PHYSICAL EXAMINATION:  GENERAL:  The patient examined at bedside, not in acute distress. VITAL SIGNS:  Presently, blood pressure 140/98, pulse 98, temperature 98.7, respirations 18, O2 sat 97%. HEENT:  Anicteric.  No pallor.  No discharge from ears, eyes, nose, or mouth. CHEST:  Bilateral air entry present.  No  rhonchi.  No crepitation. HEART:  S1 and S2 heard. ABDOMEN:  Soft, nontender.  Bowel sounds heard. CNS:  Alert, awake, oriented to time, place and person.  Moves upper and lower extremities 5/5. EXTREMITIES:  Peripheral pulses felt.  No edema.  LABORATORY DATA:  EKG shows normal sinus rhythm with heart rate around 91 beats per minute with right bundle-branch block pattern with nonspecific ST changes.  Chest x-ray shows chronic changes.  No active disease.  CBC, WBC 7.6, hemoglobin 13.3, hematocrit 38.6, platelets 276, PT/INR is 14.3 and 1.  Basic metabolic panel, sodium 138, potassium  3.9, chloride 101, carbon dioxide 25, glucose 95, BUN 21, creatinine 1.1, calcium is 10, lactic acid 1.3, CK-MB is 1.9, troponin I less than 0.05, myoglobin 130.  UA shows moderate leukocytes, nitrites negative, wbc's 10, bacteria rare.  ASSESSMENT: 1. Chest congestion and palpitations with history of coronary artery     disease.  We will rule out acute coronary syndrome. 1. Recent cardiac catheterization. 2. Urinary tract infection. 3. History of diabetes mellitus type 2. 4. Obesity. 5. Anxiety.  PLAN: 1. At this time, we will admit the patient to telemetry. 2. For chest pressure and palpitations, at this time it may be for     anxiety, but given history of CAD and stents, we will rule out ACS.     The patient did have recent cardiac cath and had required no     further intervention. 3. UTI.  We will place the patient on Cipro, get urine cultures. 4. The patient was mildly hypotensive.  At this time, the patient is     normotensive.  We will hold hydrochlorothiazide for now and     continue gentle hydration, and further recommendation as condition     evolves.     Eduard Clos, MD     ANK/MEDQ  D:  09/27/2010  T:  09/27/2010  Job:  [id number]  cc:   Tammy R. Collins Scotland, M.D.  Electronically Signed by Midge Minium MD on 10/23/2010 07:29:09 AM

## 2010-10-27 LAB — GLUCOSE, CAPILLARY: Glucose-Capillary: 108 mg/dL — ABNORMAL HIGH (ref 70–99)

## 2010-10-27 LAB — HEMOGLOBIN A1C: Hgb A1c MFr Bld: 5.7 % (ref 4.6–6.1)

## 2010-10-27 LAB — CBC
Hemoglobin: 11.6 g/dL — ABNORMAL LOW (ref 12.0–15.0)
Hemoglobin: 12.1 g/dL (ref 12.0–15.0)
MCHC: 34.5 g/dL (ref 30.0–36.0)
MCHC: 34.7 g/dL (ref 30.0–36.0)
MCV: 92.3 fL (ref 78.0–100.0)
Platelets: 205 10*3/uL (ref 150–400)
Platelets: 213 10*3/uL (ref 150–400)
RBC: 3.77 MIL/uL — ABNORMAL LOW (ref 3.87–5.11)
RDW: 13.3 % (ref 11.5–15.5)
WBC: 5.7 10*3/uL (ref 4.0–10.5)

## 2010-10-27 LAB — DIFFERENTIAL
Basophils Absolute: 0 10*3/uL (ref 0.0–0.1)
Basophils Relative: 1 % (ref 0–1)
Eosinophils Relative: 3 % (ref 0–5)
Lymphocytes Relative: 29 % (ref 12–46)
Lymphocytes Relative: 30 % (ref 12–46)
Lymphs Abs: 1.6 10*3/uL (ref 0.7–4.0)
Monocytes Absolute: 0.6 10*3/uL (ref 0.1–1.0)
Monocytes Absolute: 0.6 10*3/uL (ref 0.1–1.0)
Monocytes Relative: 11 % (ref 3–12)
Monocytes Relative: 11 % (ref 3–12)
Neutro Abs: 3.1 10*3/uL (ref 1.7–7.7)
Neutrophils Relative %: 55 % (ref 43–77)

## 2010-10-27 LAB — CARDIAC PANEL(CRET KIN+CKTOT+MB+TROPI)
CK, MB: 2 ng/mL (ref 0.3–4.0)
Relative Index: INVALID (ref 0.0–2.5)
Total CK: 102 U/L (ref 7–177)
Total CK: 92 U/L (ref 7–177)
Troponin I: 0.01 ng/mL (ref 0.00–0.06)
Troponin I: 0.02 ng/mL (ref 0.00–0.06)

## 2010-10-27 LAB — BASIC METABOLIC PANEL
CO2: 27 mEq/L (ref 19–32)
Calcium: 8.2 mg/dL — ABNORMAL LOW (ref 8.4–10.5)
Calcium: 8.9 mg/dL (ref 8.4–10.5)
Creatinine, Ser: 1.57 mg/dL — ABNORMAL HIGH (ref 0.4–1.2)
GFR calc Af Amer: 38 mL/min — ABNORMAL LOW (ref >60)
GFR calc non Af Amer: 31 mL/min — ABNORMAL LOW (ref >60)
GFR calc non Af Amer: 40 mL/min — ABNORMAL LOW (ref >60)
Glucose, Bld: 131 mg/dL — ABNORMAL HIGH (ref 70–99)
Sodium: 138 mEq/L (ref 135–145)
Sodium: 140 mEq/L (ref 135–145)

## 2010-10-27 LAB — POCT CARDIAC MARKERS
CKMB, poc: 1.2 ng/mL (ref 1.0–8.0)
CKMB, poc: 2 ng/mL (ref 1.0–8.0)
Myoglobin, poc: 138 ng/mL (ref 12–200)
Myoglobin, poc: 189 ng/mL (ref 12–200)
Troponin i, poc: <0.05 ng/mL (ref 0.00–0.09)

## 2010-10-27 LAB — PROTIME-INR
INR: 1 (ref 0.00–1.49)
Prothrombin Time: 13.3 seconds (ref 11.6–15.2)

## 2010-10-27 LAB — URINALYSIS, ROUTINE W REFLEX MICROSCOPIC
Bilirubin Urine: NEGATIVE
Hgb urine dipstick: NEGATIVE
Nitrite: NEGATIVE
Protein, ur: NEGATIVE mg/dL
Urobilinogen, UA: 0.2 mg/dL (ref 0.0–1.0)

## 2010-10-27 LAB — COMPREHENSIVE METABOLIC PANEL
AST: 18 U/L (ref 0–37)
Albumin: 3.4 g/dL — ABNORMAL LOW (ref 3.5–5.2)
Alkaline Phosphatase: 46 U/L (ref 39–117)
BUN: 25 mg/dL — ABNORMAL HIGH (ref 6–23)
Calcium: 8.5 mg/dL (ref 8.4–10.5)
Calcium: 8.9 mg/dL (ref 8.4–10.5)
Creatinine, Ser: 1.5 mg/dL — ABNORMAL HIGH (ref 0.4–1.2)
Creatinine, Ser: 1.59 mg/dL — ABNORMAL HIGH (ref 0.4–1.2)
GFR calc Af Amer: 40 mL/min — ABNORMAL LOW (ref >60)
Glucose, Bld: 109 mg/dL — ABNORMAL HIGH (ref 70–99)
Sodium: 138 mEq/L (ref 135–145)
Total Protein: 6.2 g/dL (ref 6.0–8.3)
Total Protein: 6.7 g/dL (ref 6.0–8.3)

## 2010-10-27 LAB — CK TOTAL AND CKMB (NOT AT ARMC)
CK, MB: 2.1 ng/mL (ref 0.3–4.0)
Relative Index: 2 (ref 0.0–2.5)
Total CK: 104 U/L (ref 7–177)

## 2010-10-27 LAB — LIPID PANEL
LDL Cholesterol: 65 mg/dL (ref 0–99)
Triglycerides: 101 mg/dL (ref <150)
VLDL: 20 mg/dL (ref 0–40)

## 2010-10-27 LAB — APTT: aPTT: 26 seconds (ref 24–37)

## 2010-10-27 LAB — NA AND K (SODIUM & POTASSIUM), RAND UR
Potassium Urine: 15 mEq/L
Sodium, Ur: 53 mEq/L

## 2010-10-27 LAB — TROPONIN I: Troponin I: 0.01 ng/mL (ref 0.00–0.06)

## 2010-10-29 LAB — GLUCOSE, CAPILLARY
Glucose-Capillary: 110 mg/dL — ABNORMAL HIGH (ref 70–99)
Glucose-Capillary: 112 mg/dL — ABNORMAL HIGH (ref 70–99)

## 2010-11-21 ENCOUNTER — Encounter: Payer: Self-pay | Admitting: Internal Medicine

## 2010-11-24 ENCOUNTER — Ambulatory Visit (INDEPENDENT_AMBULATORY_CARE_PROVIDER_SITE_OTHER): Payer: Medicare Other | Admitting: Internal Medicine

## 2010-11-24 ENCOUNTER — Encounter: Payer: Self-pay | Admitting: Internal Medicine

## 2010-11-24 DIAGNOSIS — E785 Hyperlipidemia, unspecified: Secondary | ICD-10-CM

## 2010-11-24 DIAGNOSIS — R000 Tachycardia, unspecified: Secondary | ICD-10-CM

## 2010-11-24 DIAGNOSIS — R109 Unspecified abdominal pain: Secondary | ICD-10-CM

## 2010-11-24 DIAGNOSIS — I951 Orthostatic hypotension: Secondary | ICD-10-CM

## 2010-11-24 DIAGNOSIS — R0602 Shortness of breath: Secondary | ICD-10-CM

## 2010-11-24 DIAGNOSIS — I10 Essential (primary) hypertension: Secondary | ICD-10-CM

## 2010-11-24 DIAGNOSIS — I498 Other specified cardiac arrhythmias: Secondary | ICD-10-CM

## 2010-11-24 LAB — BASIC METABOLIC PANEL
CO2: 33 mEq/L — ABNORMAL HIGH (ref 19–32)
Chloride: 99 mEq/L (ref 96–112)
Creatinine, Ser: 1.2 mg/dL (ref 0.4–1.2)
Potassium: 4 mEq/L (ref 3.5–5.1)
Sodium: 140 mEq/L (ref 135–145)

## 2010-11-24 LAB — URINALYSIS, ROUTINE W REFLEX MICROSCOPIC
Ketones, ur: NEGATIVE
Leukocytes, UA: NEGATIVE
Nitrite: POSITIVE
Specific Gravity, Urine: 1.025 (ref 1.000–1.030)
Urobilinogen, UA: 0.2 (ref 0.0–1.0)
pH: 6 (ref 5.0–8.0)

## 2010-11-24 LAB — BRAIN NATRIURETIC PEPTIDE: Pro B Natriuretic peptide (BNP): 52 pg/mL (ref 0.0–100.0)

## 2010-11-24 NOTE — Patient Instructions (Addendum)
Lab work today. We will call you with results.  Your physician has recommended that you wear a holter monitor. Holter monitors are medical devices that record the heart's electrical activity. Doctors most often use these monitors to diagnose arrhythmias. Arrhythmias are problems with the speed or rhythm of the heartbeat. The monitor is a small, portable device. You can wear one while you do your normal daily activities. This is usually used to diagnose what is causing palpitations/syncope (passing out).  Your physician wants you to follow-up in:  November 2012 with Dr.Ross You will receive a reminder letter in the mail two months in advance. If you don't receive a letter, please call our office to schedule the follow-up appointment.

## 2010-11-24 NOTE — Progress Notes (Signed)
HPI Patient is an 77 year old with a history of CAD (s/p PTCA  Of LAD in 2006 for instent restenosis).  SHe was admitted on 2/17 with sympimts concerning for unstable angina.  Cardiac cahteterization shoeded nonobstructive CAD.  CT of chest was neg for PE .  She had a V/Q scan done later in march for SOB.  This was also negative. Since then she says she is doing much better.  Breathing is OK.  She says her stress level is improved. Allergies  Allergen Reactions  . Amoxicillin     Current Outpatient Prescriptions  Medication Sig Dispense Refill  . acetaminophen (TYLENOL) 500 MG tablet 2 tabs as directed       . albuterol (PROAIR HFA) 108 (90 BASE) MCG/ACT inhaler Inhale 2 puffs into the lungs every 6 (six) hours as needed.        Marland Kitchen alendronate (FOSAMAX) 70 MG tablet Take 70 mg by mouth every 7 (seven) days. Take with a full glass of water on an empty stomach.       Marland Kitchen amLODipine (NORVASC) 2.5 MG tablet Take 2.5 mg by mouth daily.        . ARTIFICIAL TEARS ophthalmic solution 1 drop. 1 drop both eyes two times a day as needed       . aspirin 81 MG EC tablet Take 81 mg by mouth daily.        . Calcium Carbonate-Vitamin D (CALTRATE 600+D) 600-400 MG-UNIT per chew tablet Chew 1 tablet by mouth 2 (two) times daily.        . celecoxib (CELEBREX) 200 MG capsule Take 200 mg by mouth daily.        . fish oil-omega-3 fatty acids 1000 MG capsule Take by mouth daily.        Marland Kitchen FLUoxetine (PROZAC) 10 MG capsule Take 10 mg by mouth daily.        Marland Kitchen gabapentin (NEURONTIN) 300 MG capsule 2 tabs am-1 tab pm-3 tabs at bedtime       . glipiZIDE (GLUCOTROL) 10 MG tablet Take 10 mg by mouth daily.        . hydrochlorothiazide 25 MG tablet Take 25 mg by mouth daily.        . Multiple Vitamins-Minerals (CENTRUM SILVER PO) Take by mouth daily.        . nitroGLYCERIN (NITROSTAT) 0.4 MG SL tablet Place 0.4 mg under the tongue every 5 (five) minutes as needed.        . rosuvastatin (CRESTOR) 20 MG tablet Take 20 mg by  mouth daily.          Past Medical History  Diagnosis Date  . Allergic rhinitis   . Anemia     NOS chronic disease. Hgb 11.6gm% 04/14/2009  . CAD (coronary artery disease)   . Diabetes mellitus type II   . Diverticulitis of colon   . GERD (gastroesophageal reflux disease)   . Hyperlipidemia   . HTN (hypertension)   . DM neuropathy, type II diabetes mellitus   . IBS (irritable bowel syndrome)   . Osteoarthritis   . Spinal stenosis     djd lumbar  . Multinodular goiter   . Orthostatic hypotension   . Obesity     BMI-37  . Acute renal failure     due to dehydration-Sept 2009; Normal creat 1.2 on fu 05/13/2009  . Hx of colonoscopy     Past Surgical History  Procedure Date  . Cholecystectomy   . Abdominal hysterectomy   .  Nasal sinus surgery     x2  . Bilateral arthroscopic knee surgery   . Stent surgery     Family History  Problem Relation Age of Onset  . Breast cancer Sister   . Heart disease Brother   . Colon cancer Neg Hx     History   Social History  . Marital Status: Married    Spouse Name: N/A    Number of Children: N/A  . Years of Education: N/A   Occupational History  . retired    Social History Main Topics  . Smoking status: Never Smoker   . Smokeless tobacco: Not on file  . Alcohol Use: No  . Drug Use: No  . Sexually Active: Not on file   Other Topics Concern  . Not on file   Social History Narrative  . No narrative on file    Review of Systems:  All systems reviewed.  They are negative to the above problem except as previously stated.  Vital Signs: BP 121/82  Pulse 106  Resp 18  Ht 5\' 5"  (1.651 m)  Wt 208 lb 12.8 oz (94.711 kg)  BMI 34.75 kg/m2  Physical Exam  HEENT:  Normocephalic, atraumatic. EOMI, PERRLA.  Neck: JVP is normal. No thyromegaly. No bruits.  Lungs: clear to auscultation. No rales no wheezes.  Heart: Regular rate and rhythm. Normal S1, S2. No S3.   No significant murmurs. PMI not displaced.  Abdomen:  Supple,  nontender. Normal bowel sounds. No masses. No hepatomegaly.  Extremities:   Good distal pulses throughout. Tr to 1+  lower extremity edema.  Musculoskeletal :moving all extremities.  Neuro:   alert and oriented x 3    EKG:  Probable ST.  103 bpm.  RBBB.  FIrst degreee AVBloc.  PR approximately 0.32.  Inferior MI.  Assessment and Plan:

## 2010-11-28 ENCOUNTER — Telehealth: Payer: Self-pay | Admitting: Internal Medicine

## 2010-11-28 DIAGNOSIS — N390 Urinary tract infection, site not specified: Secondary | ICD-10-CM

## 2010-11-28 MED ORDER — SULFAMETHOXAZOLE-TRIMETHOPRIM 800-160 MG PO TABS: 1.0000 | ORAL_TABLET | Freq: Two times a day (BID) | ORAL | Status: DC

## 2010-11-28 NOTE — Telephone Encounter (Signed)
Pt rtn call from yesterday °

## 2010-11-28 NOTE — Telephone Encounter (Signed)
Message copied by Layne Benton on Tue Nov 28, 2010  3:15 PM ------      Message from: Dietrich Pates      Created: Mon Nov 27, 2010  2:48 PM       Patient with evidence of UTI.  I think she was on Cipro prior.      I would recomm Bactrim DS.  1 po bid.  !0 days       Fax to primary MD

## 2010-11-28 NOTE — Telephone Encounter (Signed)
Sent medication to pharmacy.   

## 2010-12-05 NOTE — H&P (Signed)
[facility] ADMISSION   [name], [name]                    MRN:          [id number]  DATE:10/15/2007                            DOB:          [birth year]    CHIEF COMPLAINT:  Weakness, chest pain and shortness of breath.   PRIMARY CARE PHYSICIAN:  Tammy R. Collins Scotland, M.D.   CARDIOLOGIST:  Pricilla Riffle, MD, [facility]   HISTORY OF PRESENT ILLNESS:  This is an 77 year old married white female  patient of Dr. Dietrich Pates who has a history of coronary artery disease  status post Taxus stent to the LAD in Jan 28, 2004.  She then had in-  stent restenosis in 2005/01/27 when her Plavix was deceased and had a  cutting balloon procedure to the stent to the LAD.  She has not had  cardiac problems since.  She has had some trouble with gastroesophageal  reflux disease.   The patient has complained of significant weakness over the past 6  months.  She says she can hardly get out of bed to the couch and has  greatly decreased her activities.  She has also developed chest  heaviness and shortness of breath with exertion over the past month.  At  times she says the pressure is always there, but other times if she  tries to do anything it worsens.  She saw Dr. Herb Grays yesterday who  recommended her coming here, and she also went to [facility] over the weekend because of chest tightness.  She was given some  anxiolytic.  The daughter states she has had memory problems recently,  and it is difficult to get a thorough history.   ALLERGIES:  AMOXICILLIN.   CURRENT MEDICATIONS:  1. Lyrica 75 mg b.i.d.  2. Fluoxetine 10 mg daily.  3. Metoprolol 40 mg daily.  4. Carafate 1 gram q.i.d.  5. Hydrochlorothiazide 12.5 daily.  6. Vesicare 10 mg daily.  7. Pravastatin 40 mg daily.  8. Aspirin 81 mg daily.  9. Avandamet 4/500 mg daily.  10.Centrum Silver daily.  11.Caltrate plus D 500 mg b.i.d.  12.Plavix 75 mg  daily.   PAST MEDICAL HISTORY:  1. Gastroesophageal reflux disease.  2. Diverticulosis.  3. Diabetes mellitus.  4. Hypertension.  5. Hyperlipidemia.  6. Obesity.  7. History of memory problems.  8. Status post cholecystectomy.  9. Hysterectomy.  10.Colonoscopy.  11.Endoscopy.  12.History of right bundle branch block and PACs.  13.History of heme-positive stools with negative evaluation in the      past.   SOCIAL HISTORY:  She is married.  She lives with her, I believe,  daughter and two grandchildren.  She is a nonsmoker.  Very inactive.   FAMILY HISTORY:  Not available.   PHYSICAL EXAMINATION:  GENERAL:  This is an obese 77 year old white  female complaining of recurrent chest pain and shortness of breath.  VITAL SIGNS:  Blood pressure is 117/80, pulse 88, weight 216.  HEENT:  Head is normocephalic without sign of trauma.  Extraocular  movements are intact.  Pupils  equal, round and reactive to light and  accommodation.  Nasal mucosa is moist.  Throat is without erythema or  exudate.  NECK:  Without JVD, HJR, bruit or thyroid enlargement.  LUNGS:  Clear anterior, posterior and lateral.  HEART:  Irregular rate and rhythm at 100 beats per minute.  Normal S1  and S2.  Distant heart sounds.  No murmur heard.  ABDOMEN:  Obese.  Normoactive bowel sounds are heard throughout.  No  organomegaly, masses, lesions or abnormal tenderness.  EXTREMITIES:  Without cyanosis, clubbing or edema.  She has good distal  pulses.  NEUROLOGIC:  Without focal deficit.   ELECTROCARDIOGRAM:  She has a sinus arrhythmia with a right bundle  branch block.  We did run a telemetry strip and it looks like a PAT she  was in, anywhere from 80 to 110 beats per minute.   IMPRESSION:  1. Recurrent chest pain, rule out restenosis.  2. Coronary artery disease status post Taxus stent to the LAD in June      2005 with in-stent restenosis requiring cutting balloon procedure      in June 2006.  3.  Gastroesophageal reflux disease.  4. PAT.  5. Diverticulosis.  6. Diabetes mellitus.  7. Hypertension.  8. Hyperlipidemia.  9. Obesity.  10.Memory problems.   At this time, we will admit the patient to the hospital.  We will  increase her beta blockers for her PAT.  Many of her symptoms may be  coming from this, but she will also require a cardiac catheterization  which will be performed in the morning by Dr. Charlies Constable.  The patient  is agreeable.      Jacolyn Reedy, PA-C  Electronically Signed      Everardo Beals. Juanda Chance, MD, [facility]  Electronically Signed   ML/MedQ  DD: 10/15/2007  DT: 10/15/2007  Job #: [id number]

## 2010-12-05 NOTE — Discharge Summary (Signed)
[name], [name]             ACCOUNT NO.:  [id number]   MEDICAL RECORD NO.:  [id number]          PATIENT TYPE:  INP   LOCATION:  3715                         FACILITY:  [facility]   PHYSICIAN:  Bevelyn Buckles. Bensimhon, MDDATE OF BIRTH:  [birth year]   DATE OF ADMISSION:  10/15/2007  DATE OF DISCHARGE:  10/17/2007                               DISCHARGE SUMMARY   PRIMARY CARDIOLOGIST:  Pricilla Riffle, MD, [facility].   PRIMARY CARE Coltin Casher:  Tammy R. Collins Scotland, M.D.   DISCHARGE DIAGNOSIS:  Chest pain.   SECONDARY DIAGNOSES:  1. Coronary artery disease status post drug-eluting stent placement in      the LAD in 2005 with restenosis in 12/2004, requiring cutting      balloon angioplasty.  2. Hypertension.  3. Hyperlipidemia.  4. Type 2 diabetes mellitus.  5. Gastroesophageal reflux disease.  6. Diverticulosis.  7. Obesity.  8. Second-degree type I (Wenckebach heart block).  9. History of right bundle branch blocks with premature atrial      contractions.   ALLERGIES:  Amoxicillin.   PROCEDURES:  Left heart cardiac catheterization.   HISTORY OF PRESENT ILLNESS:  An 77 year old Caucasian female who was  seen in clinic on 10/15/2007 secondary to complaints of progressive  exertional chest discomfort and dyspnea.  Decision made to admit her  with the plans for cardiac catheterization.   HOSPITAL COURSE:  [name] ruled out for MI by cardiac markers x3.  She underwent left heart cardiac catheterization on 10/16/2007 revealing  patency of previously placed LAD stent, otherwise nonobstructive  coronary disease.  An EF of 60%.  Throughout the procedure,  [name]  exhibited Wenckebach, and as result, her beta-blocker was initially  cutback, and then subsequently discontinued.  This morning, [name]  has been ambulating without recurrent chest discomfort or groin  complaints.  She is being discharged home today in good condition.   DISCHARGE LABS:  Hemoglobin 11.7, hematocrit 34.7, WBC  5.4, and  platelets 227.  INR 1.0.  Sodium 136, potassium 3.9, chloride 101, CO2  28, BUN 14, creatinine 1.13, and glucose 112.  Total bilirubin 0.6,  alkaline phosphatase 52, AST 26, and ALT 18.  Total protein 7.3, albumin  3.8, calcium 9.2, cardiac enzymes negative x3, BNP 103, total  cholesterol 141, triglycerides 156, HDL 40, LDL 70, and TSH 1.171.   DISPOSITION:  The patient is being discharged home today in good  condition.   FOLLOWUP PLAN AND APPOINTMENTS:  We arranged to follow with Wende Bushy, P-AC, in our office on 10/29/2007, at 8:15 a.m.  She was asked to  follow up with Dr. Collins Scotland as previously scheduled.   DISCHARGE MEDICATIONS:  We made no changes to her medications.  1. Aspirin 81 mg daily.  2. Plavix 75 mg daily.  3. Avandamet 4/500 mg daily to be resumed on 10/19/2007.  4. Lisinopril 40 mg daily.  5. Lyrica 75 mg b.i.d.  6. VESIcare 10 mg daily.  7. Hydrochlorothiazide 12.5 mg daily.  8. Sucralfate 1 g t.i.d.  9. Prozac 10 mg daily.  10.Zegerid 40 mg b.i.d.  11.Detrol LA 4 mg  daily.  12.Caltrate Plus D 500 mg b.i.d.  13.Multivitamin daily.  14.Pravastatin 40 mg daily.  15.Nitroglycerin 0.4 mg sublingual p.r.n. chest pain.  16.Amlodipine 5 mg daily.   OUTSTANDING LABORATORY STUDIES:  None.   DURATION OF DISCHARGE/ENCOUNTER:  45 minutes including physician time.      Nicolasa Ducking, ANP      Bevelyn Buckles. Bensimhon, MD  Electronically Signed    CB/MEDQ  D:  10/17/2007  T:  10/18/2007  Job:  [id number]   cc:   Tammy R. Collins Scotland, M.D.

## 2010-12-05 NOTE — Discharge Summary (Signed)
[name], [name]             ACCOUNT NO.:  [id number]   MEDICAL RECORD NO.:  [id number]          PATIENT TYPE:  INP   LOCATION:  2036                         FACILITY:  [facility]   PHYSICIAN:  Herbie Saxon, MDDATE OF BIRTH:  09/26/1923   DATE OF ADMISSION:  10/30/2007  DATE OF DISCHARGE:  11/01/2007                               DISCHARGE SUMMARY   DISCHARGE DIAGNOSIS:  1. Atypical chest pain, noncardiac.  2. Gastroesophageal reflux disease.  3. Morbid obesity.  4. Urinary tract infection.  5. Hypertension, stable.  6. Right bundle-branch block.  7. History of coronary artery disease.  8. History of depression.  9. History of fibromyalgia.  10.Diabetes, stable.  11.History of diverticulosis.  12.Hypercholesterolemia.  13.Degenerative joint disease.   CONSULTS:  Cardiology, Dr. Gala Romney.   Echocardiogram of September 02, 2007, shows left ventricular size was  normal, left ventricular systolic function was also normal.   RADIOLOGY:  V/Q scan on October 30, 2007, was negative for PE.  Chest x-ray  on October 30, 2007, showed minimal cardiopulmonary , right basilar  atelectasis.  no evidence of acute infection.   HOSPITAL COURSE:  This 77 year old lady who recently had cardiac  catheterization, cardiology verified patent stent is  presented with  chest discomfort on October 30, 2007 .  Her serial cardiac enzymes were  negative, V/Q scan was also negative,troponin was borderline elevated.  Cardiology cleared the patient and recommended possible gastroenterology  workup  as she has  some intermittent reflux symptoms.  Also, the  urinalysis did show many bacteria .  She is being discharged home in  stable condition  She is to follow up with the primary care physician,  Dr. Maisie Fus  in 3-5 days and pcp to arrange GI workup with possible  endoscopy as an outpatient.   DISCHARGE MEDICATIONS:  1. Protonix 40 mg p.o. b.i.d.  2. Carafate 1 g p.o. q.6 h.  3. Phenergan 12.5 mg p.o.  q.6 h. p.r.n. for nausea.  4. Lyrica 150 mg nightly.  5. Lisinopril 5 mg daily.  6. Zocor 40 daily.  7. Metformin 500 mg daily  8. Detrol LA 4 mg daily.  9. Calcium carbonate 500 mg b.i.d  10.Aspirin 81 mg daily.  11.Cipro 500 mg b.i.d. for 3 more days.  12.Ambien 5 mg nightly p.r.n.  13.Combivent MDI 2 puffs q.6 h. p.r.n.   PHYSICAL EXAMINATION:  On examination, the patient is an elderly lady,  not in acute respiratory distress.  Temperature is 98, pulse is 56,  respiratory rate is 20, blood pressure 115/71.  Pupils were equal,  round, reactive to light and accommodation.  Head is atraumatic,  normocephalic.  Mucous membranes are moist No carotid bruits or  thyromegaly.  No elevated JVD.  No carotid bruits.  HEART:  Sounds S1 and S2, regular rate and rhythm.  ABDOMEN:  Soft and nontender.  No organomegaly.  CHEST:  Clear.  There are no rhonchi or rales heard.   LABORATORY DATA:  BUN is 18, creatinine of 1.2,  sodium 135, potassium  2.5, chloride 98, bicarbonate 29.  Lipid profile was normal.  WBC was  5.7, hematocrit 32.9, platelet counts are 212. .  The patient, need for  followup & GI workup was explained to the patient and he voices  understanding.      Herbie Saxon, MD  Electronically Signed     MIO/MEDQ  D:  11/01/2007  T:  11/01/2007  Job:  [id number]

## 2010-12-05 NOTE — H&P (Signed)
[name], [name]             ACCOUNT NO.:  [id number]   MEDICAL RECORD NO.:  [id number]          PATIENT TYPE:  INP   LOCATION:  1825                         FACILITY:  [facility]   PHYSICIAN:  Herbie Saxon, MDDATE OF BIRTH:  Dec 03, 1923   DATE OF ADMISSION:  10/30/2007  DATE OF DISCHARGE:                              HISTORY & PHYSICAL   PRIMARY CARE PHYSICIAN:  Dr. Yehuda Budd.   CARDIOLOGIST:  [place].   No assigned health care power-of-attorney.  She is a full code.   PRESENTING COMPLAINT:  Chest tightness, nausea and vomiting of 1 day's  duration.   HISTORY OF PRESENTING COMPLAINT:  This is an 77 year old Caucasian lady  recently discharged by the above cardiology on October 17, 2007 after  having had cardiac catheterization on October 16, 2007.  The  catheterization did not show any restenosis of her LAD stent.  She was  quite well until 10 a.m. today while at home when she suddenly felt  nauseous, dizzy, and vomited.  She felt a little bit better after lying  on the couch for a little while but had 2 more episodes of vomiting.  The patient also complained of retrosternal 4-5/10 chest tightness.  She  denies any obvious palpitations, no syncopal episode.  She had not been  having any previous cough.  No recent trauma to the chest.  She was  being considered for Holter monitoring to rule out sick sinus syndrome  as she had been having paroxysmal atrial tachycardia as an outpatient  noticed by the cardiologist.  She denies any GI or genitourinary  symptoms.  No joint swelling.  No skin rash.  Intermittent ankle  swelling.  The patient had been having nocturia.   On presentation, her blood pressure was initially elevated at 185/100  and chest x-ray reduced from 4/10 to 2/10 after having 2 nitroglycerin  tablets and 1 aspirin tablet.  Also noticed an elevated D-dimer of 0.82.  The patient complained of a frontal headache, no blurring of vision.  No  seizure activity.   PAST  MEDICAL HISTORY:  Coronary artery disease, status post stent in  2005 and 2006, degenerative disk disease, depression, fibromyalgia,  hypercholesterolemia, right bundle branch block, hypertension, diabetes,  gastroesophageal reflux disease, obesity, diverticulosis, history of  paroxysmal atrial tachycardia.   FAMILY HISTORY:  Her mother had a brain tumor.  Father had myocardial  infarction.  Brother had myocardial infarction.  Another brother with  CVA.  A sister with coronary artery disease.   SOCIAL HISTORY:  The patient lives in [place] with her husband.  Six  children.  No past history of tobacco, alcohol or illicit drug use.   PAST SURGICAL HISTORY:  Hysterectomy, cholecystectomy, history of occult  GI bleeding, status post colonoscopy and upper endoscopy, status post  heart catheterization in 2005, 2006, and 2009.  Her cardiologist is  Dietrich Pates, MD.   ALLERGIES:  AMOXICILLIN.   MEDICATIONS:  1. Aspirin 81 mg daily.  2. Avandamet 500/4 one tablet daily.  3. Caltrate 600.  4. Lyrica 50 mg daily.  5. Ranexa 1 daily.  6. Plavix 75 mg  daily.  7. Pravachol 80 mg daily.  8. protonix 40 mg b.i.d.  9. Fluoxetine 10 mg daily.  10.Lisinopril 40 mg b.i.d.  11.Nitroglycerin 0.4 mg sublingual p.r.n.  12.Sucralfate 1 gram t.i.d.  13.Multi-vitamins.  14.Detrol LA 4 mg daily.  15.Hydrochlorothiazide  12.5 mg daily.  16.Amlodipine 5 mg daily.   REVIEW OF SYSTEMS:  Fourteen systems are reviewed.  Pertinent positive  as in history of presenting complaint.   EXAMINATION:  She is an elderly lady, obese, not in acute respiratory  distress.  Temperature is 98, pulse is 69, respiratory rate is 22, blood  pressure 127/71.  Head is atraumatic, normocephalic.  Mucous membranes are moist.  Oropharynx and nasopharynx are clear.  Extraocular muscles are intact.  Neck is supple.  There are no carotid bruits or jugular venous  distension.  No palpable thyromegaly.  Her chest is clinically  clear.  No chest wall deformities.  Heart sounds S1, S2, regular.  Abdomen is soft, nontender.  Truncal obesity.  Inguinal orifices are  patent.  She is alert and orientated to time, place and person.  Power is 5 in all limbs.  Deep tendon reflexes 2+ in all limbs.  Peripheral pulses reduced.  No pedal edema.   AVAILABLE LABORATORY DATA:  WBC 6.3, hematocrit 35, platelet count is  219, troponin I less than 0.05.  INR is 1.  Chemistry:  Sodium 137,  potassium 3.7, chloride 97, bicarbonate 25, glucose 128, BUN 17,  creatinine 1.1.  D-dimer is 0.82.  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  Chest x-ray shows mild cardiac enlargement,  decreased lung volumes with mild bibasilar atelectasis.  The EKG shows  normal sinus rhythm with first-degree heart block, right bundle branch  block at a rate of 71 per minute.  Marland Kitchen   PLAN:  The patient is to be admitted to a telemetry bed.  Will get  serial cardiac enzymes and obtain an echocardiogram.  None have been  done in the last 6 months.  Consider cardiology consult.  Will start her  on morphine 2 mg IV q.6 h, nitroglycerin 4 mg sublingual p.r.n.,  lisinopril 5 mg daily, Toprol XL 25 mg daily.  Continue Avandamet 500/4  mg daily.  Further medications of Lyrica, Pravachol, Zegerid,  sucralfate.  Put on meclizine 25 mg q.8 h p.r.n.  Consider [facility] for Holter monitoring.  Protonix 40 mg IV daily, Lovenox 40  mg subcu daily, Phenergan IV q.8 h p.r.n. for nausea.  Oxygen 2 liters  per nasal cannula.  Start BP  checks q.a.m.  She is to be on bedrest and IV fluid with half-normal  saline 25 mL per hour.  Thyroid function tests.  Cardiac BNP if this has  not been checked in the last 6 months.  Her illness and treatment plan  were explained to her and her daughter.  They verbalized understanding.      Herbie Saxon, MD  Electronically  Signed     MIO/MEDQ  D:  10/30/2007  T:  10/30/2007  Job:  [id number]   cc:   Pricilla Riffle, MD, [facility]  Tammy R. Collins Scotland, M.D.

## 2010-12-05 NOTE — Op Note (Signed)
[name], [name]             ACCOUNT NO.:  [id number]   MEDICAL RECORD NO.:  [id number]          PATIENT TYPE:  AMB   LOCATION:  ENDO                         FACILITY:  [facility]   PHYSICIAN:  Iva Boop, MD,FACGDATE OF BIRTH:  [birth year]   DATE OF PROCEDURE:  12/08/2007  DATE OF DISCHARGE:  12/08/2007                               OPERATIVE REPORT   PROCEDURE:  Esophageal manometry.   INDICATION:  Chest tightness/chest pain, history of suspected esophageal  dysmotility.   The manometry procedure was performed by nursing staff from the  Endoscopy Department.  The tracings were reviewed and deemed adequate  for interpretation.   FINDINGS:  1. The lower esophageal sphincter is located from 41-47 cm with a      pressure inversion point at 42.5 cm.  The lower esophageal      sphincter pressure is 30 mmHg, which is normal.  2. Residual pressure was slightly high 8.5 mm with 67% relaxation.  3. Lower esophageal body study showed normal peristalsis at 100%.      Amplitudes were normal overall.  Upper esophageal body study was      normal as well.   The upper central sphincter pressure was normal and there was mildly  elevated residual pressure at 10.4 mm with 81% relaxation, and overall  thought to be within normal limits.   ASSESSMENT:  This is an overall normal esophageal manometry with some  minor nonspecific abnormalities.  I do not find a cause for her chest  pain here.   RECOMMENDATIONS AND PLAN:  I will advise her to try nitroglycerin for  her chest pain to see if that helps.  Further plans pending a  therapeutic trial.      Iva Boop, MD,FACG  Electronically Signed     CEG/MEDQ  D:  12/12/2007  T:  12/12/2007  Job:  [id number]   cc:   Tammy R. Collins [place], M.D.  Fax: [phone]   Antony Contras, MD  Fax: [phone]   Pricilla Riffle, MD, [facility]  [address]  [address]  [place]  [place] [address]

## 2010-12-05 NOTE — Assessment & Plan Note (Signed)
[name]                            CARDIOLOGY OFFICE NOTE   DANITZA, SCHOENFELDT                    MRN:          [id number]  DATE:10/29/2007                            DOB:          January 20, 1924    This is a patient of Dr. Dietrich Pates.   PRIMARY CARDIOLOGIST:  Dr. Dietrich Pates.   PRIMARY CARE [name]:  Dr. Herb Grays.   Ms. [name] is an 77 year old white female, a patient of Dr. Tenny Craw, who I  admitted to the hospital on October 15, 2007 because of recurrent chest  tightness as well as being in PAT rhythm while she was in the hospital.  We admitted her to the hospital, increased her beta blocker and she  underwent cardiac catheterization.  She ended up going into a Wenckebach  rhythm on the increase beta blocker; this was decreased and then stopped  in the hospital, but she inadvertently is taking some at home.  Our  chart says she is on 40 mg of metoprolol, but we are not sure exactly  what she is taking and she has been asked to call us with this  information.  On cardiac catheterization, she had no critical restenosis  of her LAD stent and no new lesions.  They felt part of her symptoms may  be due to her rhythm.   Since the patient has been home, she has not had any more chest  tightness.  The only reason she knew her heart rate was elevated before  was when she took her blood pressure, her monitor revealed this, but she  has not had any fast heart beats.  Overall, she is feeling well from a  cardiac standpoint.  She does have good days and bad days, but mostly  this is related to fatigue.   CURRENT MEDICATIONS:  As far as we know:  1. Lyrica 75 mg b.i.d.  2. Plavix 75 mg daily.  3. Caltrate plus D 500 mg b.i.d.  4. Avandamet 4/500 daily.  5. Aspirin 81 mg daily.  6. Pravastatin 40 mg daily.  7. Hydrochlorothiazide 12.5 mg daily.  8. Metoprolol is listed at 40 mg daily and needs to be verified.  9. Fluoxetine 10 mg daily.  10.Centrum  Silver daily.   PHYSICAL EXAM:  This is a pleasant 77 year old white female in no acute  distress.  Blood pressure 110/64, pulse 73, weight 216.  NECK:  Without JVD, HJR, bruit or thyroid enlargement.  LUNGS:  Clear anterior, posterior and lateral.  HEART:  Regular rate and rhythm at 73 beats per minute, normal S1 and  S2, positive S4.  No distant heart sounds.  ABDOMEN:  Soft without organomegaly, masses, lesions or abnormal  tenderness.  Right groin:  Small knot, no hematoma or hemorrhage.  LOWER EXTREMITIES:  Without cyanosis, clubbing or edema.  She is wearing  TED hose.  She has positive distal pulses.   EKG:  Today reveals normal sinus rhythm with first-degree AV block and  right bundle branch block.   IMPRESSION:  1. Chest pain, resolved.  Cardiac catheterization revealed no  restenosis and otherwise nonobstructive coronary disease, ejection      fraction 60%.  2. History of coronary artery disease, status post drug-eluting stent      to the left anterior descending in 2005 with restenosis in June      2006, requiring cutting balloon angioplasty.  3. Paroxysmal atrial tachycardia in the office, but developed      Wenckebach in the hospital on increased beta blocker.  4. Hypertension.  5. Hyperlipidemia.  6. Diabetes mellitus, type 2.  7. Gastroesophageal reflux disease.  8. Diverticulosis.  9. Obesity.  10.History of right bundle branch block.  11.Memory problems.   PLAN:  At this time, the patient is doing much better.  A lot of her  symptoms were probably coming from her PAT.  We do need to know what  beta blocker dose she is taking and if she continues to have problems.  She may need a Holter to assess her for sick sinus syndrome.  She will  follow up with Dr. Tenny Craw in 1-2 months or sooner if needed.      Jacolyn Reedy, PA-C  Electronically Signed      Noralyn Pick. Eden Emms, MD, [facility]  Electronically Signed   ML/MedQ  DD: 10/29/2007  DT: 10/29/2007  Job #:  [id number]

## 2010-12-05 NOTE — Assessment & Plan Note (Signed)
HEALTHCARE                         GASTROENTEROLOGY OFFICE NOTE   [name], [name]                    MRN:          [id number]  DATE:02/26/2007                            DOB:          01/08/1924    CHIEF COMPLAINT:  Hoarseness and reflux.   ASSESSMENT:  An 77 year old white woman with chronic problems with  hoarseness and reflux symptoms.  Classic heartburn symptomatology is  gone on PPI, but she still has intermittent hoarseness.  She seems to  have allergies as well, and some sinus drainage.  I suspect this is  multifactorial, in that it has not responded completely to longterm PPI  therapy.  Carafate has been added, which may help, but I suspect PPI  therapy would do better.  There is some very vague dysphagia symptoms  that really do not qualify for true esophageal dysphagia.  She has some  dry mouth and dry throat over the past several months that she cannot  pinpoint.  She also has had an ENT evaluation by Dr. Jearld Fenton, and  apparently laryngoscopy was negative for any serious problems.  Her  multinodular goiter could be an issues.   RECOMMENDATIONS AND PLAN:  Upper GI series.  She had an upper endoscopy  in 2004 that was negative (Dr. Yancey Flemings).  Further plans pending that.  Consider pH probe monitoring with a Bravo pH probe x48 hours.  Further  plans pending the upper gastrointestinal series.  In the meantime,  continue current medications.  Consider repeat ultrasound of goiter,  depending upon the last one.   HISTORY:  See my medical history form for full details, essentially as  above.  She has some occasional right lower quadrant pain and  constipation, and has a diagnosis of irritable bowel syndrome.  She has  been on multiple allergy treatments, Mucinex, etc.  She has not stayed  on anything longterm necessarily.   MEDICATIONS:  1. Plavix 75 mg daily.  2. Metoprolol 25 mg twice daily.  3. Caltrate plus D 500 mg b.i.d.  4.  Centrum Silver daily.  5. Avandamet 4/500 mg daily.  6. Aspirin 81 mg daily.  7. Lyrica 75 mg t.i.d.  8. Pravastatin 40 mg daily.  9. Vesicare 10 mg daily.  10.Hydrochlorothiazide 12.5 mg daily.  11.Lisinopril 40 mg daily.  12.Zegerid 40 mg twice daily for several months at least.  13.Sucralfate 1 gm 4 times a day.  14.Prozac 10 mg daily.  15.Tylenol p.r.n.  16.Nitroglycerin p.r.n.   Note, that the medications listed from her June note from Dr. Collins Scotland also  include Astelin nasal spray, but I do not think she is taking that now.   THERE IS AN APPARENT ALLERGY TO AMOXICILLIN LISTED ON THAT SHEET.   PAST MEDICAL HISTORY:  1. Hypertension.  2. Obesity.  3. Peripheral edema.  4. Diabetes mellitus type 2.  5. Diabetic neuropathy.  6. Anemia of chronic disease.  7. Severe diverticulosis.  Colonoscopy/barium enema unrevealing as far      as polyps.  8. History of goiter, multinodular, which improved in 2007 on      ultrasound ordered by Dr.  Dietrich Pates.  Apparently, TSH was normal.  9. Allergic rhinitis.  10.Dyslipidemia.  11.Gastroesophageal reflux disease.  12.Prior hysterectomy.  13.Prior cholecystectomy.  14.Bilateral knee arthroscopy.  15.Sinus surgery x2.  16.Coronary artery disease with percutaneous transluminal coronary      angioplasty and stenting 2005.  Followed by Dr. Tenny Craw.  On chronic      Plavix.  17.Spinal stenosis in the lumbar region with degenerative disk      disease.  18.Osteoarthritis.   FAMILY HISTORY:  Noncontributory.  See medical history form.   SOCIAL HISTORY:  She is married, lives with her family.  She has been a  Visual merchandiser.  No alcohol, tobacco, or drugs.   REVIEW OF SYSTEMS:  See my medical history form for full details.   PHYSICAL EXAMINATION:  Reveals an obese, pleasant, elderly white woman  in no acute distress.  Height 5 feet 6 inches.  Weight 219 pounds.  Blood pressure 166/64.  Pulse 72.  EYES:  Anicteric.  ENT shows dentures, otherwise  free of lesions.  NECK:  Supple.  I do not detect any obvious thyromegaly or mass.  LUNGS:  Clear.  HEART:  S1 and S2.  No murmurs, rubs, or gallops.  ABDOMEN:  Obese, soft, and nontender.  No organomegaly or mass.  LOWER EXTREMITIES:  Trace peripheral edema bilaterally.  SKIN:  Some sun damage changes, otherwise no acute lesions or rash.  PSYCHIATRIC:  She is alert and oriented x3.  LYMPH NODES:  No neck or supraclavicular nodes.     Iva Boop, MD,FACG  Electronically Signed    CEG/MedQ  DD: 02/26/2007  DT: 02/26/2007  Job #: [id number]   cc:   Tammy R. Collins Scotland, M.D.

## 2010-12-05 NOTE — Op Note (Signed)
NAMEMARIANE, [name]             ACCOUNT NO.:  [id number]   MEDICAL RECORD NO.:  [id number]          PATIENT TYPE:  AMB   LOCATION:  ENDO                         FACILITY:  [facility]   PHYSICIAN:  Iva Boop, MD,FACGDATE OF BIRTH:  10/07/1923   DATE OF PROCEDURE:  05/19/2007  DATE OF DISCHARGE:  05/19/2007                               OPERATIVE REPORT   PROCEDURE:  BRAVO 48-hour pH monitoring of the esophagus.   INDICATIONS:  Reflux and hoarseness.   FINDINGS:  Day 1:  Fraction of time pH less than 4 in the esophagus was  0.1% with only four reflux episodes and a DeMeester score of 0.9.   On day 2, the DeMeester score was 5.5 with a fraction of time pH less  than 4 1.3%.   The total fraction time pH less than 4 was 0.7%.  There were 40 reflux  episodes, 11 were postprandial, 40 were in the upright position.   This procedure was performed on medication.  There was no good symptom  correlation.  She was on omeprazole 40 mg b.i.d.  She had tertiary  contractions on barium swallow.  She also has chronic cough.   ASSESSMENT:  There is a minimal reflux here, despite omeprazole 40 mg  b.i.d.  There is poor symptom correlation.   I suppose it is possible there is some link between her reflux and  cough, but it is not really evident here.   PLAN:  I think the best we can do in this lady is continue her on b.i.d.  proton pump inhibitor therapy and I can see her back if needed.  We will  let her know.      Iva Boop, MD,FACG  Electronically Signed     CEG/MEDQ  D:  05/29/2007  T:  05/30/2007  Job:  [id number]   cc:   Tammy R. Collins Scotland, M.D.  Fax: [phone]   Antony Contras, MD  Fax: [phone]

## 2010-12-05 NOTE — Assessment & Plan Note (Signed)
[facility]                            CARDIOLOGY OFFICE NOTE   GARRETT, BOWRING                    MRN:          [id number]  DATE:12/04/2007                            DOB:          Dec 14, 1923    IDENTIFICATION:  Ms. [name] is an 77 year old woman.  She is also  followed by Dr. Herb Grays.  She was last seen in the cardiology clinic  back in April.  This was post-procedure.  Note,  she had a cardiac catheterization done on October 16, 2007 that showed no  significant in-stent restenosis to the LAD stent, no other significant  lesions.  Note, she had some Wenckebach during the case and her beta-  blocker was discontinued.   Since she was seen by Herma Carson in April, she has been doing okay.  She denies palpitations.  She denies chest pressure.  Still complains of  some edema in her legs.   CURRENT MEDICATIONS:  1. Lyrica 75 b.i.d.  2. Zegerid 40 b.i.d.  3. Carafate 1 gm t.i.d.  4. Detrol LA.  5. AstraPro 2 sprays b.i.d.  6. Amlodipine 5.  7. Lisinopril 40 b.i.d., we have daily.  8. Also Plavix 75.  9. Caltrate D.  10.Centrum Silver.  11.Avandamet 4/500.  12.Aspirin 81.  13.Pravastatin 40.  14.HCTZ 12.5.  15.Fluoxetine.   PHYSICAL EXAMINATION:  GENERAL:  The patient is in no distress.  VITAL SIGNS:  Blood pressure is 115/70, pulse is 60 and regular, weight  221 up 5 pounds from April.  LUNGS:  Clear.  CARDIAC:  Regular rate and rhythm, S1-S2, no significant murmurs.  ABDOMEN:  Obese, benign.  EXTREMITIES:  The patient with support hose on and trace edema.   IMPRESSION:  1. Coronary artery disease.  Again looked good by cath with no      significant in-stent restenosis and mild lesions elsewhere.  Would      continue on medical therapy.  The patient asymptomatic.  Doing okay      off beta blocker and would keep it at this.  2. History of paroxysmal atrial tachycardia recorded in the hospital,      though she did have some  Wenckebach, asymptomatic now off beta      blocker, would follow.  3. Hypertension, adequate control.  4. Dyslipidemia on statin.  LDL in the hospital 70, HDL 40.  Would      continue to watch carbs as triglycerides are 156.   PLAN:  I encouraged the patient to increase her activity.  We will check  if possible for weight loss.  We will check a carotid.  Also set her up  with the lower extremity edema for an echo to evaluate diastolic  function.     Pricilla Riffle, MD, [facility]  Electronically Signed    PVR/MedQ  DD: 12/04/2007  DT: 12/04/2007  Job #: [id number]   cc:   Tammy R. Collins Scotland, M.D.

## 2010-12-05 NOTE — Consult Note (Signed)
[name], [name]             ACCOUNT NO.:  [id number]   MEDICAL RECORD NO.:  [id number]          PATIENT TYPE:  INP   LOCATION:  2036                         FACILITY:  [facility]   PHYSICIAN:  Bevelyn Buckles. Bensimhon, MDDATE OF BIRTH:  August 23, 1923   DATE OF CONSULTATION:  10/30/2007  DATE OF DISCHARGE:                                 CONSULTATION   REFERRING PHYSICIAN:  Dr. Waymon Amato of the [facility].   PRIMARY CARE PHYSICIAN:  Dr. Herb Grays.   CARDIOLOGIST:  Dr. Dietrich Pates.   REASON FOR CONSULTATION:  Chest pain and dizziness.   Ms. Terry is an 77 year old woman with a history of coronary artery  disease, status post Taxus LAD stent in June 2005 complicated by in-  stent restenosis in June 2006 which was treated with cutting balloon.  She also has a history of morbid obesity, hypertension, hyperlipidemia,  diabetes, and gastroesophageal reflux disease.  She has seen Dr. Leone Payor  in the past, at which time she had a barium swallow that showed tertiary  contractions consistent with dysmotility.  She also had a pH probe  placed.   She was admitted a couple of weeks ago with chest pain.  She underwent  cardiac catheterization which showed a patent stent with minimal  nonobstructive disease.  She had a normal ejection fraction.   Today she went out to the back porch to call her husband.  She became  dizzy and developed nausea and vomiting and then chest pain.  She called  the EMS and was brought to the emergency room.  She was treated with  Zofran and morphine.  The dizziness resolved.  She still had some mild  residual tightness.  She had a minimally elevated D-dimer and underwent  VQ scan which was negative.  A CT of the head showed no acute process.   REVIEW OF SYSTEMS:  She denies any exertional chest pain.  She does have  some reflux symptoms but says it is not as bad as before.  She does not  have any pill dysphasia.  She does have some recent nausea and  vomiting.  No diarrhea.  No bright red blood per rectum.  No melena.  No fevers or  chills.  Remainder of review of systems is negative except for HPI and  problem list.   PROBLEM:  1. Coronary artery disease as described above.  2. History of paroxysmal atrial tachycardia with block.  3. Chronic right bundle branch block.  4. Morbid obesity.  5. Gastroesophageal reflux disease.  6. Hypertension.  7. Hyperlipidemia.  8. Diabetes.  9. Depression.  10.Gallstones, status post cholecystectomy.   CURRENT MEDICATIONS:  1. Lyrica 75 b.i.d.  2. Prozac 10 a day.  3. Hydrochlorothiazide 12.5.  4. Aspirin 81.  5. Pravastatin 40.  6. Multivitamin.  7. Plavix 75.  8. Avandamet 4/50.  9. Sucralfate 1 g t.i.d.   ALLERGIES:  AMOXICILLIN.   SOCIAL HISTORY:  She lives in [place] with her husband.  She has no  history of tobacco or drug use.  Denies alcohol.   FAMILY HISTORY:  Noncontributory.   PHYSICAL  EXAM:  GENERAL:  She is a morbidly obese woman, lying flat in  bed in no acute distress.  VITAL SIGNS:  Blood pressure is 127/71, heart rate 69, sating 100% on  room air.  HEENT:  Normal.  NECK:  Supple.  There is no JVD.  Carotids are 2+ bilaterally without  any bruits.  There is no lymphadenopathy or thyromegaly.  CARDIAC:  PMI  is not palpable.  She is regular with 2/6 systolic ejection murmur at  the left sternal border.  No rub or gallop.  LUNGS:  Clear.  ABDOMEN:  Obese and soft, minimally tender in the right upper quadrant.  No rebound or guarding.  Good bowel sounds.  EXTREMITIES:  Warm with no  cyanosis, clubbing, or edema.  No rash.  NEURO:  Alert and oriented x3.  Cranial nerves II-XII are intact.  Moves  all fours without difficulty.  Affect is pleasant.   LABORATORY DATA:  White count of 6.3, hemoglobin 12.1, platelet are 279.  Sodium is 137, potassium 3.7, BUN is 17, creatinine 1.19.  Cardiac  markers are troponin 0.05 x2, BNP is 58, D-dimer 0.82.  EKG shows sinus   rhythm with a right bundle branch block.  No acute ST-T wave changes.   ASSESSMENT/PLAN:  Chest pain.  Given her recent cardiac catheterization,  I do not think that this is cardiac in nature.  I suspect this is likely  GI or some other etiology.  I do not think she needs further cardiac  workup at this time.  Would consider consulting GI for their input.  We  will sign off.  Please not hesitate to call with questions.      Bevelyn Buckles. Bensimhon, MD  Electronically Signed     DRB/MEDQ  D:  10/30/2007  T:  10/31/2007  Job:  [id number]

## 2010-12-05 NOTE — Cardiovascular Report (Signed)
NAMESHAVAWN, STOBAUGH             ACCOUNT NO.:  [id number]   MEDICAL RECORD NO.:  [id number]          PATIENT TYPE:  INP   LOCATION:  3715                         FACILITY:  [facility]   PHYSICIAN:  Noralyn Pick. Eden Emms, MD, FACCDATE OF BIRTH:  [birth year]   DATE OF PROCEDURE:  DATE OF DISCHARGE:                            CARDIAC CATHETERIZATION   HISTORY OF PRESENT ILLNESS:  An 83-year patient with history of repeat  interventions to the mid-LAD, previous Taxus stent with cutting balloon  for in-stent restenosis in 2006.   Admitted from the office yesterday with dyspnea, fatigue and some chest  pressure.   Cine catheterization done with 6-French catheter from right femoral  artery.   The patient received 2 mg of Versed for sedation.  She tolerated the  procedure well.   Left main coronary artery was normal.   Left anterior descending artery was normal in the proximal portion.  The  midvessel had a widely patent stent.  There was about a 40% stenosis at  the proximal edge of the stent right at the bifurcation of a large first  diagonal branch.  This area was somewhat difficult to lay out due to its  tortuosity.  I did not think there was a flow limiting lesion here.  The  distal LAD was normal.  First diagonal branch was large and branching  with only 20% multiple lesions.   Circumflex coronary artery was nondominant, but larger than the right  coronary artery.  The mid circumflex at 20-30% multiple lesions.  There  was a large first obtuse marginal branch with 20-30% proximal disease.   Right coronary was dominant but small.  It was normal.   RAO VENTRICULOGRAPHY:  RAO ventriculography was normal.  EF was 60%.  There was no grain across the aortic valve and no MR.  The patient's  pressure was mildly elevated during the case.   The LV pressure was in the 160/80 range.  Aortic pressure was in the  150/80, LV pressure was 160/15.   Note should be made that while we were in the  case, the patient's rhythm  was abnormal.  She appeared to be in a Wenckebach rhythm throughout the  case.   IMPRESSION:  The patient has no critical restenosis of her LAD stent and  no new de Novo lesions.  Part of her symptoms may be due to her rhythm.  She was on Lopressor 50 b.i.d. given her significant Wenckebach with  pauses.  We will cut this back to 25 b.i.d.  I have discussed the case  with Dr. Juanda Chance.  I will try to get a hold of Dr. Tenny Craw.  I suspect we  will keep her in the hospital to monitor her rhythm.      Noralyn Pick. Eden Emms, MD, [facility]  Electronically Signed     PCN/MEDQ  D:  10/16/2007  T:  10/16/2007  Job:  [id number]   cc:   Pricilla Riffle, MD, [facility]

## 2010-12-05 NOTE — Assessment & Plan Note (Signed)
[facility]                         GASTROENTEROLOGY OFFICE NOTE   [name], [name]                    MRN:          [id number]  DATE:04/23/2007                            DOB:          1924/04/05    CHIEF COMPLAINT:  Followup of hoarseness, reflux.   INTERVAL HISTORY:  [name] had a barium swallow that showed tertiary  contractions consistent with dysmotility. She also had an upper GI  portion of that that showed a very small hiatal hernia but no definite  reflux. I have explained this to her today. She remains on omeprazole 40  mg before breakfast and supper. She also takes sucralfate. She still has  hoarseness. Apparently the more she talks the better it gets. Sinus  drainage is probably better at this point. I have reviewed records from  Dr. 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  Medications are listed and reviewed in the chart and are essentially  unchanged but are:  1. Plavix 75 mg daily.  2. Caltrate plus D 500 mg twice a day  3. Centrum Silver daily  4. Avandamet 4/500 mg daily  5. Aspirin 81 mg daily  6. Lyrica 75 mg t.i.d.  7. Pravastatin 40 mg daily  8. Vesicare 10 mg daily  9. Hydrochlorothiazide 12.5 mg daily  10.Sucralfate 1 gram 4 times a day,  11.Metoprolol 40 mg daily  12.Fluoxetine 10 mg daily,  13.Omeprazole 40 mg b.i.d.  14.Nitroglycerine p.r.n.,  15.Tylenol p.r.n.   There are no known drug allergies THOUGH AMOXICILLIN MIGHT BE A PROBLEM.   PAST MEDICAL HISTORY:  See list from note of February 26, 2007.   PHYSICAL EXAMINATION:  She is obese, no acute distress, weight 218  pounds, pulse 56, blood pressure 142/80. Her voice is clear to me and  there is no obvious hoarseness.   ASSESSMENT:  Reflux problems, hoarseness, esophageal dysmotility. She  has very vague dysphagia. She had a little bit of a problem with that  when she ate crackers too fast.   PLAN:  We discussed the possibility of esophageal dilation for  dysmotility. I think that is a low yield for her. She really swallows  reasonably well and it is probably motility. Obviously if there were a  stricture we could dilate. We are going to go ahead with an upper GI  endoscopy and a Bravo pH probe (48 hour monitoring) to try to determine  if she is still having acid reflux problems while on b.i.d. proton pump  inhibitor. We will hold her Carafate 2 days before. We will ask to hold  her Plavix 5 to 7 days before (clearance from Dr. Dietrich Pates) because I  suppose there is some potential for bleeding from clipping the Bravo  capsule into the esophageal mucosa. Risk, benefits, and indications of  the procedures are explained. She understands and agrees to proceed.     Iva Boop, MD,FACG  Electronically Signed    CEG/MedQ  DD: 04/23/2007  DT: 04/23/2007  Job #: [id number]   cc:   Tammy R. Collins Scotland,  M.D.  Pricilla Riffle, MD, [facility]

## 2010-12-08 NOTE — Cardiovascular Report (Signed)
NAME:  [name], [name]                       ACCOUNT NO.:  [id number]   MEDICAL RECORD NO.:  [id number]                   PATIENT TYPE:  INP   LOCATION:  6523                                 FACILITY:  [facility]   PHYSICIAN:  Arturo Morton. Riley Kill, M.D. [facility]         DATE OF BIRTH:  May 21, 1924   DATE OF PROCEDURE:  DATE OF DISCHARGE:  01/07/2004                              CARDIAC CATHETERIZATION   PROCEDURES:  Percutaneous stenting of the left anterior descending artery.   CARDIOLOGIST:  Arturo Morton. Riley Kill, M.D.   INDICATIONS:  The patient was admitted with recurrent chest pain.  She  underwent cardiac catheterization which demonstrated a high grade stenosis  in the distal left anterior descending artery.  There were scattered lesions  in the right and a modest lesion in the circumflex marginal.  She had mild  pulmonary hypertension.  The current study was done to assess coronary  anatomy.  Of note, there was also a slight step up in the oxygen saturation  but no echocardiographic evidence of a left to right shunt.  We also did  venous Dopplers and a D-Dimer related to her history of shortness of breath  and mild pulmonary hypertension by catheterization study.  These were  unremarkable.  I had reviewed the films with Dr. Corinda Gubler, and it was our  feeling that we should go ahead and recommend attempted percutaneous  intervention.  I explained the risks and benefits of the procedure to the  patient.  I told her that it might be difficult to get a drug-eluting stent  into place.  In part, the vessel is small, its distally located after  several bends.  However, the patient is diabetic so the initial treatment  plan was to try to place a drug-eluting stent in the distal left anterior  descending artery.   DESCRIPTION OF PROCEDURE:  The patient was brought to the catheterization  lab, prepped and draped in the usual fashion.  Through an anterior puncture,  the left femoral artery was entered.   A 7 French sheath was placed.  Heparin  and Integrilin were given according to protocol and ACT checked and found to  be appropriate.  A JL-35 guiding catheter was utilized.  A high torque  floppy traverse wire was passed down the artery, and we were able to get  across the lesion.  Initial dilatation was done with a 2.25 x 20 Maverick  balloon.  Following this, the vessel was stented using a 2.5 x 24 Taxus.  This was taken up to about 9-10 atmospheres in an attempt to not over size  the distal artery.  However, following this, a 2.5 Quantum Maverick was then  passed down the vessel.  A 6-8 atmospheres inflation was done distally, but  then throughout the course of the stent 14 atmospheres inflations were done  to fully deploy the stent.  This was taken all the way back up to the  proximal edge  of the stent.  There was marked improvement in the appearance  of the artery.   All catheters were subsequently removed and the femoral sheath sewn into  place.  Heparin was discontinued.  She will remain on Integrilin for  approximately 12 hours.  She has been pretreated with clopidogrel,.   ANGIOGRAPHIC DATA:  1. The proximal left anterior descending artery is a large caliber vessel     that is highly tortuous.  There is a first bend in the vessel which then     provides a large diagonal branch and courses to the distal left anterior     descending artery.  The distal LAD demonstrates an area of segmental     disease with worst stenosis being 90% in several areas.  2. The circumflex also demonstrates about 40-50% area in the proximal     marginal branch before its bifurcation.  Otherwise, no high grade disease     is noted.  Following the balloon and stenting procedure, the LAD is     reduced to 0% with excellent distal runoff into the distal artery.   CONCLUSION:  Successful percutaneous stenting of the distal left anterior  descending artery using a Taxus drug-eluting stent.   DISPOSITION:   The patient will be treated medically.  She will need  followup.  We may want to consider at some point in time an MRI or a  transesophageal echocardiogram to rule out a shunt although there is no  evidence of this on transthoracic.                                               Arturo Morton. Riley Kill, M.D. [facility]    TDS/MEDQ  D:  01/06/2004  T:  01/07/2004  Job:  [id number]   cc:   Cecil Cranker, M.D.   Arturo Morton. Riley Kill, M.D. [facility]   CV Laboratory   Ernestina Penna, M.D.  [address] [place]  [place] [address]  Fax: [phone]

## 2010-12-08 NOTE — Assessment & Plan Note (Signed)
[facility]                            CARDIOLOGY OFFICE NOTE   [name], [name]                    MRN:          [id number]  DATE:09/09/2006                            DOB:          [birth year]    IN DENTIFICATION:  [name] is an 77 year old woman.  I last saw her  in April 2007.  History of CAD, with PTCA to the LAD in 2006 (99% LAD  descending lesion end-stent restenosis).  Plan for continued Plavix.   In the interval, she complains of some lightheadedness when lying down  and turning her head to the right.  Otherwise, no chest pain, no  shortness of breath.   CURRENT MEDICATIONS:  1. Plavix 75 daily.  2. Metoprolol 25 b.i.d.  3. Vytorin 1080 daily.  4. Caltrate b.i.d.  5. Centrum Silver.  6. Avandamet 4/500 daily.  7. Aspirin 81 mg daily.  8. Vytorin 1040.   PHYSICAL EXAMINATION:  Patient is in no distress.  Blood pressure 149/74.  Pulse is 72.  Weight 214.  NECK:  JVP is normal.  LUNGS:  Clear.  CARDIAC EXAM:  Regular rate and rhythm.  S1.  S2.  No S3.  No  significant murmurs.  EXTREMITIES:  No edema.   IMPRESSION:  1. Coronary artery disease.  Continue on current regimen.  Relatively      asymptomatic.  2. Cardiovascular disease.  Had an ultrasound done in August, which      shows stable disease.  Follow up in 2 years.  Again, abnormal      thyroid nodes is forwarded to Dr. Collins Scotland.  3. Dyslipidemia.  Lipid panel is okay from outside.  Would continue.  4. Neuropathy.  On Cymbalta.  5. Hypertension.  Adequate control.  6. Dizziness with lying.  Whether she has some carotid      hypersensitivity, it appears minimal.  If it worsens, she should      call, but I would plan conservative therapy for now.   I will set followup for the fall, sooner if problems develop.     Pricilla Riffle, MD, [facility]  Electronically Signed    PVR/MedQ  DD: 09/09/2006  DT: 09/10/2006  Job #: [id number]   cc:   Tammy R. Collins Scotland, M.D.

## 2010-12-08 NOTE — Cardiovascular Report (Signed)
NAME:  [name], [name]                       ACCOUNT NO.:  [id number]   MEDICAL RECORD NO.:  [id number]                   PATIENT TYPE:  INP   LOCATION:  3708                                 FACILITY:  [facility]   PHYSICIAN:  Arturo Morton. Riley Kill, M.D. [facility]         DATE OF BIRTH:  07-17-1924   DATE OF PROCEDURE:  01/04/2004  DATE OF DISCHARGE:                              CARDIAC CATHETERIZATION   INDICATIONS:  The patient is a 77 year old who presents with progressive  shortness of breath and some chest discomfort.  Right and left heart  catheterization was recommended.   Also the patient had been enrolled in the acuity study as she presented with  what was thought to be an acute coronary syndrome.   PROCEDURE:  1. Right and left heart catheterization.  2. Selective coronary arteriography.  3. Selective left ventriculography.   DESCRIPTION OF PROCEDURE:  The patient was brought to the cath lab and  prepped and draped in the usual fashion.  Through an anterior puncture the  right femoral artery was easily entered.  Using  #6 French catheters  ventriculography was performed as well as diagnostic coronary arteriography.  She tolerated this well without complication.  We then placed a #7 French  sheath in the right femoral vein, a 7.5 French thermodilution [place]  catheter was then passed through the pulmonary artery in a pulmonary  capillary wedge position.  Superior vena cava saturations were also  obtained.  The same transducer was used so simultaneous pressures were not  recorded.  However there was fairly good correlation.  The following cardiac  output by thermodilution, the catheter was subsequently removed.  She was  taken to the holding area in satisfactory clinical condition.   HEMODYNAMIC DATA:  1. Right atrium 12.  2. Right ventricle 38./11.  3. Pulmonary artery 38/19.  4. Pulmonary capillary wedge 19.  5. Left ventricle 156/19.  6. Aortic 155/62, mean 103.  7.  Superior vena cava saturation 57%.  8. PA saturation 67%.  9. Aortic saturation 94%.  10.      Thermodilution cardiac output is 4.8 liters/minute.  11.      Thermodilution cardiac index 2.4 liters/minute per metered square.  12.      Fick cardiac output 4.0 liters/minute.  13.      Fick cardiac index 2.0 liters/minute per metered square.   ANGIOGRAPHIC DATA:  1. Ventriculography was performed in the RAO projection.  Overall systolic     function was all preserved and no segmental abnormalities or contractions     were identified.   1. The left main coronary artery was large and free of critical disease.   1. The LAD coursed to the apex.  The LAD was fairly tortuous proximally and     was a large caliber vessel.  It led into a diagonal on the distal LAD.     The distal LAD itself was smaller in caliber.  Just  after the takeoff of     the large diagonal there were multiple lesions in the LAD constituting a     segmental area of about 30 mm.  These ranged from 40 to 80% in luminal     reduction.  The distal vessel was a small caliber vessel.  The large     diagonal has about 30% narrowing.   1. There is a small ramus intermedius with mild luminal irregularity.   1. The circumflex provides 2 marginal branches, the first marginal is a     moderate sized vessel that has about 40 to 50% narrowing leading into     this bifurcation.   1. The right coronary artery is a small caliber vessel with about 30%     proximal narrowing, then a 40 to 50% mid narrowing, 30% distal narrowing.   CONCLUSIONS:  1. Well preserved left ventricular function.  2. Mild pulmonary hypertension with mild pulmonary capillary wedge     elevation.  3. Moderate step-up in saturation from the superior vena cava to the     pulmonary artery of 57 to 67%, question etiology.  4. Predominant single vessel disease with a high-grade area of disease in     the LAD after the take-off of the major diagonal.    DISPOSITION:  1. The patient may need an MRI scan or possibly a TEE to exclude some sort     of shunt.  2. We will get a D-dimer.  3. Options will be considered regarding the patient's mid left anterior     artery, this potentially could be a difficult procedure.  I will review     these with my colleagues.                                               Arturo Morton. Riley Kill, M.D. [facility]    TDS/MEDQ  D:  01/04/2004  T:  01/05/2004  Job:  [id number]   cc:   Pricilla Riffle, M.D.

## 2010-12-08 NOTE — Assessment & Plan Note (Addendum)
Encouraged her to drink adequate fluids.  Will check labs (BMET, UA)

## 2010-12-08 NOTE — Assessment & Plan Note (Signed)
Will need to be followed. 

## 2010-12-08 NOTE — Discharge Summary (Signed)
NAME:  Becky, Shepherd                       ACCOUNT NO.:  [id number]   MEDICAL RECORD NO.:  [id number]                   PATIENT TYPE:  INP   LOCATION:  6523                                 FACILITY:  [facility]   PHYSICIAN:  Cecil Cranker, M.D.             DATE OF BIRTH:  1923/11/27   DATE OF ADMISSION:  01/03/2004  DATE OF DISCHARGE:  01/07/2004                                 DISCHARGE SUMMARY   PROCEDURES:  1. Cardiac catheterization.  2. Coronary arteriogram.  3. Left ventriculogram.  4. Recatheterization with Taxus stent to one vessel.  5. Lower extremity venous Dopplers.   HOSPITAL COURSE:  [name] is a 77 year old female with no known history  of coronary artery disease.  She has been evaluated by Dr. Tenny Craw in the past  with a negative work-up, including a nonischemic Cardiolite.  She was seen  in the office on 01/03/2004 for evaluation of chest pain.  She also has  dyspnea on exertion but denies any syncope.  Her EKG did not show any acute  ischemic changes but her symptoms felt consistent with unstable anginal pain  and she was transported by EMS to [facility] for further evaluation  and treatment.   She was seen by research and enrolled in the Acuity study.  Her enzymes were  negative for MI but a cardiac catheterization was felt indicated and this  was performed on 01/04/2004.  The cardiac catheterization showed an LAD  lesion of 70-80% with other lesions between 30% and 50% in the RCA,  circumflex, diagonal and more distal LAD.  Dr. Riley Kill evaluated the films  and felt that medical therapy versus percutaneous intervention was the  decision.  He also felt that she had mildly increased pulmonary arterial  pressures.   [name] had recurrent chest pain and because of this, he felt that  percutaneous intervention was indicated.  He re-cathed her and treated her  LAD with PTCA and a Taxus stent, reducing the stenosis from 90% to 0.  He  had good  run-off with TIMI III flow.   Post-procedure, her cath site had a small amount of ecchymosis but no  hematoma or bruit.  Because of her diabetes, her Glucophage had been on  hold.  She is to restart this in 48 hours.  [name] was considered  stable for discharge on 01/07/2004 with outpatient follow up arranged.  Of  note, she had a brief episode of arrhythmia that was possible atrial  fibrillation versus bigeminal PACs.  She had no syncope or pre-syncope with  this and no further arrhythmia was seen.   LABORATORY VALUES:  D-dimer 0.26.  Total cholesterol 117, triglycerides 85,  HDL 52, LDL 48.   DISCHARGE CONDITION:  Improved.   DISCHARGE DIAGNOSES:  1. Unstable anginal pain status post percutaneous transluminal coronary     angioplasty and Taxus stent to the left anterior descending this  admission.  2. Diabetes mellitus.  3. Hypertension.  4. Ecchymosis at the right groin site with no evidence of deep venous     thrombosis, superficial thrombus or baker's cyst seen post     catheterization.  5. Hyperlipidemia.  6. Hypertension.  7. History of palpitations with premature atrial contractions seen on an     event monitor.   DISCHARGE INSTRUCTIONS:  Her activity level is to include no driving for two  days and no strenuous activity for a week.  She is to stick to a low fat  diabetic diet.  She is to call the office for problems with the cath site.  She is to see the physician's assistant for Dr. Tenny Craw on January 25, 2004 at 2:30  P.Lesle Chris MEDICATIONS:  1. Diovan/HCT as prior to admission.  2. Aspirin, calcium, Centrum Silver as prior to admission.  3. Vytorin as prior to admission.  4. Lexapro 10 mg q.d.  5. Metformin 500 mg b.i.d.  6. Clonazepam 0.5 mg p.r.n.      Theodore Demark, P.A. [facility]                  E. Graceann Congress, M.D.    RB/MEDQ  D:  01/07/2004  T:  01/10/2004  Job:  [id number]   cc:   Ernestina Penna, M.D.  [address] [place]  [place]  [address]  Fax: [phone]   Pricilla Riffle, M.D.

## 2010-12-08 NOTE — H&P (Signed)
NAMELONNI, DIRDEN NO.:  [id number]   MEDICAL RECORD NO.:  [id number]          PATIENT TYPE:  EMS   LOCATION:  MAJO                         FACILITY:  [facility]   PHYSICIAN:  Charlton Haws, M.D.     DATE OF BIRTH:  1924-02-19   DATE OF ADMISSION:  01/05/2005  DATE OF DISCHARGE:                                HISTORY & PHYSICAL   CARDIOLOGIST:  Pricilla Riffle, M.D.   PRIMARY CARE PHYSICIAN:  Thomos Lemons, D.O. with [facility].   CHIEF COMPLAINT:  Chest pain.  This is a very pleasant, 77 year old  Caucasian female known to Dr. Tenny Craw with a history of CAD, status post 6 CA  and Taxus stent in June of 2005 who presents today with complaints of nausea  and chest tightness.  Onset was actually Tuesday into Wednesday when she had  considerable amount of nausea and decreased, but never vomited.  Wednesday  evening after she had gone to bed, she got up to go to the bathroom and  experienced some tightness in her chest.  Discomfort was midsternal; she  rated it around a 4, at that time; it lasted a few minutes.  The patient  finally dozed off and went back to sleep.  Thursday morning when she woke up  she was still nauseated and felt weaker.  She had episodes of chest  tightness, midsternal, intermittent throughout the day and rated it around a  4 when it did occur.  This morning she saw Dr. Artist Pais for complaints of the  nausea and expressed concern to him that she was having the chest tightness.  He treated her for a UTI and told her that if she continued to have the  chest tightness to follow up with Dr. Tenny Craw on Monday per patient.  The  patient went to get her prescription filled.  She continued to have the  chest tightness and it got worse.  She states that it was as high as a 6.  She called our office and was told to go ahead and come to the emergency  room for further evaluation.  She currently is rating her pain at around a 4  after a nitroglycerin sublingual.   ALLERGIES:  No known.   MEDICATIONS:  1.  Metoprolol 25 mg p.o. b.i.d.  2.  Metformin 500 mg daily.  3.  Avandia 4 mg daily.  4.  Diovan 160/12.5 mg daily.  5.  Aspirin 81 mg daily.  6.  Vytorin 10/40 mg daily.  7.  Prilosec 20 mg daily.   PAST MEDICAL HISTORY:  Includes cardiac catheterization in June 2005 by Dr.  Shawnie Pons.  The patient had a right-and-left heart catheterization,  well preserved ventricular function, mild pulmonary hypertension, with mild  pulmonary capillary wedge elevation, predominant single vessel disease with  a high-grade native disease and LAD after the takeoff of the major diagonal.  The patient was recatheterized with a drug-eluting Taxus stent to the LAD.  Other medical history includes diabetes, hypertension, hyperlipidemia,  arrhythmia evidently documented as PACs, history of right bundle branch  block, anemia.  The  patient states that she has been told that she has a  Hemoccult-positive stool before, status post colonoscopy and status post  endoscopy, both negative per patient done by Dr. Marina Goodell, hysterectomy and  cholecystectomy.   SOCIAL HISTORY:  She lives in [place] with her husband.  She is retired.  She has 6 children, 2 of whom are deceased.  One child deceased from MI.  She denies ever using tobacco, EtOH, drug or __________ medication. No  exercise.  Diet -- somewhat noncompliant.  She states she tries to follow  her sugar and her low fat, low salt, but it is difficulty.   FAMILY HISTORY:  Mother is deceased in her 58s from a brain tumor. Father  deceased at 24 from an MI.  Siblings.  She has a brother who is deceased in  his 47s from an MI.  She has a sister alive with an ICD, a sister alive with  known CAD, a brother alive with a CVA, status post MI.   REVIEW OF SYSTEMS:  Positive for chest pain, positive for peripheral edema.  GENITOURINARY:  Positive for frequency and urgency.  MS: Positive for  myalgia and arthralgia, joint  swelling and pain in her knees and back  secondary to arthritis.  GI:  Positive for nausea and GERD symptoms if does  not take Prilosec.   PHYSICAL EXAMINATION:  VITAL SIGNS:  Temperature 99.2, pulse 56,  respirations 22, blood pressure 156/74.  She saturated 97% on room air. The  patient states she was told at Dr. Olegario Messier office that she weighed 204.  GENERAL:  She is alert and oriented in no acute distress very pleasant and  cooperative female.  HEENT:  Pupils are equal, round, and reacted to light.  Sclerae are clear.  NECK:  Supple without lymphadenopathy.  Negative bruit, negative JVD.  CARDIOVASCULAR:  Heart rate regular rhythm, S1 and S2.  LUNGS:  Clear to auscultation.  ABDOMEN:  Soft, somewhat tender throughout, slightly guarded with exam.  Positive bowel sounds.  She has no clubbing, cyanosis, or edema at the  present time.  NEUROLOGIC:  She is alert an oriented x3.  Cranial nerves II-XII intact.   Chest x-ray is pending. EKG at a rate of 57, sinus bradycardia with a right  bundle branch block.  Lab work is pending.   ASSESSMENT AND PLAN:  Dr. Charlton Haws in to examine the patient.  A 77-year-  old known coronary artery disease, percutaneous transluminal coronary  angioplasty to the stent and LAD in 2005, recurrent symptomatic chest pain  relieved with nitrates, some nausea, status post cholecystectomy.  Exam  complained of some abdominal discomfort and midsternal discomfort.  Patient  will be admitted to telemetry, rule out myocardial infarction.  Anticoagulated with Heparin.  Check a KUB film, check LFTs, amylase and  lipase.  Continue her medications from home.  Hold the metformin.  Also will  check a hemoglobin A1c and a fasting lipid profile and plan on  catheterization on Monday.  Therese Sarah   MB/MEDQ  D:  01/05/2005  T:  01/06/2005  Job:  [id number]

## 2010-12-08 NOTE — Assessment & Plan Note (Signed)
Patient  Is tachycardic today.  EKG with first degeee AV block.  WIll schedule holter.

## 2010-12-08 NOTE — Cardiovascular Report (Signed)
NAME:  [name], [name]             ACCOUNT NO.:  [id number]   MEDICAL RECORD NO.:  [id number]          PATIENT TYPE:  INP   LOCATION:  3729                         FACILITY:  [facility]   PHYSICIAN:  Carole Binning, M.D. LHCDATE OF BIRTH:  [birth year]   DATE OF PROCEDURE:  01/08/2005  DATE OF DISCHARGE:                              CARDIAC CATHETERIZATION   PROCEDURE PERFORMED:  PTCA utilizing the cutting balloon at the mid left  anterior descending artery.   INDICATIONS:  The patient is a 77 year old woman who underwent placement of  a TAXUS drug-eluting stent in the mid left anterior descending artery in  June of 2005. She presented to the hospital with two days of unstable  anginal symptoms. She ruled out for myocardial infarction. Cardiac  catheterization performed today by Dr. Antoine Poche revealed a focal 99% in-  stent restenosis in the distal portion of the stent in mid left anterior  descending artery. We therefore opted to proceed with percutaneous coronary  intervention.   PROCEDURE NOTE:  We utilized the preexisting 6-French sheath in the right  femoral artery. Angiomax was administered per protocol. We used a 6-French  JL-3.5 guiding catheter. An [employer] soft coronary guidewire was advanced under  fluoroscopic guidance into the distal LAD. We then performed PTCA of the  focal in-stent restenosis with a 2.5 x 10 mm cutting balloon for three  inflations to 6, 10 and 10 atmospheres, respectively.  Intracoronary  nitroglycerin was administered. Final angiographic images were obtained  revealing patency of the LAD with 0% residual stenosis at the PTCA site and  TIMI-3 flow into the distal vessel.   COMPLICATIONS:  None.   RESULTS:  Successful percutaneous transluminal coronary angioplasty of the  mid left anterior descending utilizing the cutting balloon. A 99% in-stent  restenosis was reduced to 0% residual with TIMI-3 flow.   PLAN:  Angiograms will be discontinued. The patient  be restarted on Plavix  which should be continued for an additional year. If she presents with  recurrent restenosis, would consider placement of an alternative drug-  eluting stent.       MWP/MEDQ  D:  01/08/2005  T:  01/08/2005  Job:  [id number]   cc:   Pricilla Riffle, M.D.   Thomos Lemons, D.O. [facility]

## 2010-12-08 NOTE — Discharge Summary (Signed)
NAMECARLI, [name]             ACCOUNT NO.:  [id number]   MEDICAL RECORD NO.:  [id number]          PATIENT TYPE:  INP   LOCATION:  6522                         FACILITY:  [facility]   PHYSICIAN:  Carole Binning, M.D. LHCDATE OF BIRTH:  May 02, 1924   DATE OF ADMISSION:  01/05/2005  DATE OF DISCHARGE:  01/09/2005                                 DISCHARGE SUMMARY   PROCEDURES:  1.  Cardiac catheterization.  2.  Coronary arteriogram.  3.  Left ventriculogram.  4  Percutaneous transluminal coronary angioplasty with a cutting balloon to  the mid left anterior descending.   DISCHARGE DIAGNOSES:  1.  Unstable anginal pain, status post percutaneous transluminal coronary      angioplasty with a cutting balloon to the left anterior descending for      in-stent restenosis.  2.  Residual nonobstructive disease, 25% to 30%, to the circumflex, right      coronary artery and obtuse marginal.  3.  Preserved left ventricular function with an ejection fraction of 60% at      catheterization.  4.  Diabetes.  5.  Hypertension.  6.  History of anemia.  7.  History of hyperlipidemia.  8.  Obesity.  9.  Status post cholecystectomy, hysterectomy, colonoscopy and endoscopy.  10. History of right bundle branch block and premature atrial contractions.  11. History of heme-positive stools with negative evaluation.  12. History of gastroesophageal reflux disease symptoms.  13. Status post percutaneous transluminal coronary angioplasty and TAXUS      stent to the left anterior descending in June 2005.   HOSPITAL COURSE:  [name] is a 77 year old female with known heart  disease.  She came to the emergency room for chest pain that started several  days prior to admission.  It was episodic, but increased and she saw her  primary care physician.  She had nausea and was diagnosed with a urinary  tract infection.  Her chest pain continued and she came to the emergency  room.  She was admitted for further  evaluation and treatment.   Cardiac enzymes were negative for MI, but it was felt that further  evaluation with a catheterization was needed and this was performed on January 08, 2005.   The cardiac catheterization showed severe single-vessel disease with 99% in-  stent restenosis  in the LAD.  This was treated with PTCA and a cutting  balloon.   Post cath, [name] is without chest pain or shortness of breath and her  EKG has no acute changes.  If she does well overnight, she is tentatively  considered stable for discharge on January 09, 2005 with outpatient followup  arranged.   DISCHARGE INSTRUCTIONS:  1.  Her activity level is to include no driving for 2 days and no strenuous      activity for week.  2.  She is to stick to a low-fat diabetic diet.  3.  She is to call the office for problems with the cath site.   FOLLOWUP:  She is to follow up with Dr. Tenny Craw on January 25, 2005 at 2:45 and  with Dr. Artist Pais as needed or as scheduled.   DISCHARGE MEDICATIONS:  1.  Metformin is on hold, restart January 11, 2005.  2.  Coated aspirin 325 mg. daily.  3.  Plavix 75 mg daily.  4.  Nitroglycerin sublingual p.r.n.  5.  Metoprolol 25 mg b.i.d.  6.  Avandia 4 mg daily.  7.  Prilosec OTC daily.  8.  Vytorin 10/40 daily.  9.  Diovan/hydrochlorothiazide 160/12.5 mg daily.       RB/MEDQ  D:  01/08/2005  T:  01/09/2005  Job:  [id number]   cc:   Thomos Lemons, D.O. [facility]   Dr. Tenny Craw

## 2010-12-08 NOTE — Cardiovascular Report (Signed)
NAMELORIJEAN, HUSSER             ACCOUNT NO.:  [id number]   MEDICAL RECORD NO.:  [id number]          PATIENT TYPE:  INP   LOCATION:  3729                         FACILITY:  [facility]   PHYSICIAN:  Rollene Rotunda, M.D.   DATE OF BIRTH:  [birth year]   DATE OF PROCEDURE:  01/08/2005  DATE OF DISCHARGE:                              CARDIAC CATHETERIZATION   PRIMARY:  Dr. Sherrye Payor.   CARDIOLOGIST:  Dietrich Pates, M.D.   PROCEDURE:  1.  Left heart catheterizations.  2.  Coronary arteriography.   INDICATION:  Evaluate patient with unstable angina and previous stenting of  an LAD using a TAXUS 2.5 mm stent.   PROCEDURAL NOTE:  Left heart catheterization was performed via the right  femoral artery.  The artery was cannulated using an anterior wall puncture.  A #6 French arterial sheath was inserted via the modified Seldinger  technique.  Preformed Judkins and a pigtail catheter were utilized.  The  patient tolerated the procedure well and left the lab in stable condition.   RESULTS:   HEMODYNAMICS:  LV 179/16, AO 179/64.   CORONARIES:  The left main was normal.  The LAD had a mid stent with diffuse  instent irregularities.  There was 99% stenosis in the distal segment of the  stent.  There was a very large mid diagonal which had a mid 30% stenosis.  Circumflex in the AV groove was a large long vessel.  There were diffuse  proximal irregularities.  There was a large obtuse marginal branch seen with  proximal 30% stenosis.  The posterolateral was small and normal.  The right  coronary artery was dominant but not as large as the circumflex.  There was  proximal and mid diffuse 25% stenosis.  PDA was moderate sized and normal.   LEFT VENTRICULOGRAM:  The left ventriculogram was obtained in the RAO  projection.  The EF was 65% with normal regional wall motion.   CONCLUSION:  High grade single vessel coronary artery disease.  Well  preserved ejection fraction.   PLAN:  The patient will have  percutaneous revascularization of the instent  restenosis per Dr. Gerri Spore.       JH/MEDQ  D:  01/08/2005  T:  01/08/2005  Job:  [id number]   cc:   Dory Horn, Dr.

## 2011-04-05 ENCOUNTER — Ambulatory Visit (INDEPENDENT_AMBULATORY_CARE_PROVIDER_SITE_OTHER): Payer: Medicare Other | Admitting: Internal Medicine

## 2011-04-05 ENCOUNTER — Encounter: Payer: Self-pay | Admitting: Internal Medicine

## 2011-04-05 DIAGNOSIS — E785 Hyperlipidemia, unspecified: Secondary | ICD-10-CM

## 2011-04-05 DIAGNOSIS — I498 Other specified cardiac arrhythmias: Secondary | ICD-10-CM

## 2011-04-05 DIAGNOSIS — I119 Hypertensive heart disease without heart failure: Secondary | ICD-10-CM

## 2011-04-05 DIAGNOSIS — I2510 Atherosclerotic heart disease of native coronary artery without angina pectoris: Secondary | ICD-10-CM | POA: Insufficient documentation

## 2011-04-05 DIAGNOSIS — I951 Orthostatic hypotension: Secondary | ICD-10-CM

## 2011-04-05 NOTE — Patient Instructions (Signed)
Your physician wants you to follow-up in MAY 2013 You will receive a reminder letter in the mail two months in advance. If you don't receive a letter, please call our office to schedule the follow-up appointment.  

## 2011-04-05 NOTE — Assessment & Plan Note (Signed)
Symptoms are improved.  Patient says she is drinking more fluids.  No changes.

## 2011-04-05 NOTE — Assessment & Plan Note (Signed)
No symptoms to suggest angina.  Keep on current regimen.

## 2011-04-05 NOTE — Assessment & Plan Note (Signed)
Keep on same meds  ?

## 2011-04-05 NOTE — Progress Notes (Signed)
[name] is an 77 year old with a history of CAD (s/p PTCA Of LAD in 2006 for instent restenosis). SHe was admitted on 2/17 with sympimts concerning for unstable angina. Cardiac cahteterization shoeded nonobstructive CAD. CT of chest was neg for PE . She had a V/Q scan done later in march for SOB. This was also negative.  Wnen I saw her in May she was doing better.  Did have some palpitatoins.  Bp was a little low.  Recomm fluids  Set up with holter. Since seen breathing is OK.    Dizziness is better.  Only occasionally.   No palpitaitons. Allergies      Allergies  Allergen Reactions  . Amoxicillin     Current Outpatient Prescriptions  Medication Sig Dispense Refill  . acetaminophen (TYLENOL) 500 MG tablet 2 tabs as directed       . albuterol (PROAIR HFA) 108 (90 BASE) MCG/ACT inhaler Inhale 2 puffs into the lungs every 6 (six) hours as needed.        Marland Kitchen alendronate (FOSAMAX) 70 MG tablet Take 70 mg by mouth every 7 (seven) days. Take with a full glass of water on an empty stomach.       Marland Kitchen amLODipine (NORVASC) 2.5 MG tablet Take 2.5 mg by mouth daily.        . ARTIFICIAL TEARS ophthalmic solution 1 drop. 1 drop both eyes two times a day as needed       . aspirin 81 MG EC tablet Take 81 mg by mouth daily.        . Calcium Carbonate-Vitamin D (CALTRATE 600+D) 600-400 MG-UNIT per chew tablet Chew 1 tablet by mouth 2 (two) times daily.        Marland Kitchen gabapentin (NEURONTIN) 300 MG capsule 2 tabs am-1 tab pm-3 tabs at bedtime       . glipiZIDE (GLUCOTROL) 10 MG tablet Take 10 mg by mouth daily.        . hydrochlorothiazide 25 MG tablet Take 25 mg by mouth daily.        . Multiple Vitamins-Minerals (CENTRUM SILVER PO) Take by mouth daily.        . nitroGLYCERIN (NITROSTAT) 0.4 MG SL tablet Place 0.4 mg under the tongue every 5 (five) minutes as needed.        . rosuvastatin (CRESTOR) 20 MG tablet Take 20 mg by mouth daily.        Marland Kitchen sulfamethoxazole-trimethoprim (BACTRIM DS) 800-160 MG per tablet Take  1 tablet by mouth 2 (two) times daily.  20 tablet  0    Past Medical History  Diagnosis Date  . Allergic rhinitis   . Anemia     NOS chronic disease. Hgb 11.6gm% 04/14/2009  . CAD (coronary artery disease)   . Diabetes mellitus type II   . Diverticulitis of colon   . GERD (gastroesophageal reflux disease)   . Hyperlipidemia   . HTN (hypertension)   . DM neuropathy, type II diabetes mellitus   . IBS (irritable bowel syndrome)   . Osteoarthritis   . Spinal stenosis     djd lumbar  . Multinodular goiter   . Orthostatic hypotension   . Obesity     BMI-37  . Acute renal failure     due to dehydration-Sept 2009; Normal creat 1.2 on fu 05/13/2009  . Hx of colonoscopy     Past Surgical History  Procedure Date  . Cholecystectomy   . Abdominal hysterectomy   . Nasal sinus surgery  x2  . Bilateral arthroscopic knee surgery   . Stent surgery     Family History  Problem Relation Age of Onset  . Breast cancer Sister   . Heart disease Brother   . Colon cancer Neg Hx     History   Social History  . Marital Status: Married    Spouse Name: N/A    Number of Children: N/A  . Years of Education: N/A   Occupational History  . retired    Social History Main Topics  . Smoking status: Never Smoker   . Smokeless tobacco: Not on file  . Alcohol Use: No  . Drug Use: No  . Sexually Active: Not on file   Other Topics Concern  . Not on file   Social History Narrative  . No narrative on file    Review of Systems:  All systems reviewed.  They are negative to the above problem except as previously stated.  Vital Signs: BP 132/68  Pulse 90  Ht 5\' 2"  (1.575 m)  Wt 203 lb (92.08 kg)  BMI 37.13 kg/m2  Physical Exam  HEENT:  Normocephalic, atraumatic. EOMI, PERRLA.  Neck: JVP is normal. No thyromegaly. No bruits.  Lungs: clear to auscultation. No rales no wheezes.  Heart: Regular rate and rhythm. Normal S1, S2. No S3.   No significant murmurs. PMI not displaced.   Abdomen:  Supple, nontender. Normal bowel sounds. No masses. No hepatomegaly.  Extremities:   Good distal pulses throughout. No lower extremity edema.  Musculoskeletal :moving all extremities.  Neuro:   alert and oriented x3.  CN II-XII grossly intact.  EKG:  NSR.  90.  First degree AV block  PR 292.  RBBB.  Assessment and Plan:

## 2011-04-05 NOTE — Assessment & Plan Note (Signed)
Holter monitor without signif pauses or tachycardia.   Average heart rates in 80s.  Asymptomatic.

## 2011-04-16 LAB — CK TOTAL AND CKMB (NOT AT ARMC)
CK, MB: 1.6
CK, MB: 1.7
Relative Index: 1.5
Relative Index: INVALID
Total CK: 104
Total CK: 113

## 2011-04-16 LAB — BASIC METABOLIC PANEL
BUN: 14
CO2: 28
Calcium: 9.2
Chloride: 101
Creatinine, Ser: 1.13
GFR calc Af Amer: 56 — ABNORMAL LOW
Glucose, Bld: 112 — ABNORMAL HIGH

## 2011-04-16 LAB — COMPREHENSIVE METABOLIC PANEL
AST: 26
Albumin: 3.8
Chloride: 98
Creatinine, Ser: 1.19
GFR calc Af Amer: 53 — ABNORMAL LOW
Potassium: 4.4
Total Bilirubin: 0.6
Total Protein: 7.3

## 2011-04-16 LAB — CBC
HCT: 38.4
MCHC: 33.8
MCV: 91.4
Platelets: 227
Platelets: 260
RDW: 14.4
RDW: 14.5
WBC: 6.2

## 2011-04-16 LAB — TROPONIN I: Troponin I: 0.02

## 2011-04-16 LAB — LIPID PANEL
HDL: 40
Total CHOL/HDL Ratio: 3.5
VLDL: 31

## 2011-04-16 LAB — B-NATRIURETIC PEPTIDE (CONVERTED LAB): Pro B Natriuretic peptide (BNP): 103 — ABNORMAL HIGH

## 2011-04-16 LAB — APTT: aPTT: 27

## 2011-04-16 LAB — TSH: TSH: 1.171

## 2011-04-17 LAB — TROPONIN I: Troponin I: 0.02

## 2011-04-17 LAB — URINE MICROSCOPIC-ADD ON

## 2011-04-17 LAB — POCT CARDIAC MARKERS
CKMB, poc: 1.4
Operator id: 285841
Troponin i, poc: <0.05
Troponin i, poc: <0.05

## 2011-04-17 LAB — DIFFERENTIAL
Basophils Absolute: 0
Basophils Relative: 1
Lymphocytes Relative: 15
Monocytes Absolute: 0.2
Monocytes Relative: 4
Neutro Abs: 5
Neutrophils Relative %: 80 — ABNORMAL HIGH

## 2011-04-17 LAB — CBC
Hemoglobin: 12.1
MCHC: 34.4
MCHC: 34.5
Platelets: 212
RBC: 3.87
RDW: 14.4
RDW: 14.6

## 2011-04-17 LAB — BASIC METABOLIC PANEL
Calcium: 9
Calcium: 9.7
Creatinine, Ser: 1.19
GFR calc Af Amer: 49 — ABNORMAL LOW
GFR calc Af Amer: 53 — ABNORMAL LOW
GFR calc non Af Amer: 41 — ABNORMAL LOW
GFR calc non Af Amer: 44 — ABNORMAL LOW
Potassium: 3.5
Sodium: 135
Sodium: 137

## 2011-04-17 LAB — URINE CULTURE: Colony Count: >=100000

## 2011-04-17 LAB — LIPID PANEL
Cholesterol: 154
LDL Cholesterol: 84
VLDL: 19

## 2011-04-17 LAB — URINALYSIS, ROUTINE W REFLEX MICROSCOPIC
Bilirubin Urine: NEGATIVE
Glucose, UA: NEGATIVE
Hgb urine dipstick: NEGATIVE
Ketones, ur: 15 — AB
Protein, ur: NEGATIVE
pH: 7

## 2011-04-17 LAB — CARDIAC PANEL(CRET KIN+CKTOT+MB+TROPI)
CK, MB: 1.9
CK, MB: 2.3
Relative Index: 2.3
Total CK: 100
Total CK: 99
Troponin I: 0.03

## 2011-04-17 LAB — CK TOTAL AND CKMB (NOT AT ARMC)
CK, MB: 2.2
Relative Index: 1.6

## 2011-04-17 LAB — PROTIME-INR: INR: 1

## 2011-04-17 LAB — D-DIMER, QUANTITATIVE: D-Dimer, Quant: 0.82 — ABNORMAL HIGH

## 2011-04-20 ENCOUNTER — Encounter: Payer: Self-pay | Admitting: Internal Medicine

## 2011-05-22 ENCOUNTER — Emergency Department (HOSPITAL_COMMUNITY)
Admission: EM | Admit: 2011-05-22 | Discharge: 2011-05-22 | Disposition: A | Discharge status: Home / Self Care | Payer: Medicare Other | Attending: Emergency Medicine | Admitting: Emergency Medicine

## 2011-05-22 ENCOUNTER — Emergency Department (HOSPITAL_COMMUNITY): Payer: Medicare Other

## 2011-05-22 DIAGNOSIS — N390 Urinary tract infection, site not specified: Secondary | ICD-10-CM | POA: Insufficient documentation

## 2011-05-22 DIAGNOSIS — I10 Essential (primary) hypertension: Secondary | ICD-10-CM | POA: Insufficient documentation

## 2011-05-22 DIAGNOSIS — Z79899 Other long term (current) drug therapy: Secondary | ICD-10-CM | POA: Insufficient documentation

## 2011-05-22 DIAGNOSIS — R079 Chest pain, unspecified: Secondary | ICD-10-CM | POA: Insufficient documentation

## 2011-05-22 DIAGNOSIS — R300 Dysuria: Secondary | ICD-10-CM | POA: Insufficient documentation

## 2011-05-22 DIAGNOSIS — R110 Nausea: Secondary | ICD-10-CM | POA: Insufficient documentation

## 2011-05-22 DIAGNOSIS — M549 Dorsalgia, unspecified: Secondary | ICD-10-CM | POA: Insufficient documentation

## 2011-05-22 DIAGNOSIS — R000 Tachycardia, unspecified: Secondary | ICD-10-CM

## 2011-05-22 DIAGNOSIS — E119 Type 2 diabetes mellitus without complications: Secondary | ICD-10-CM | POA: Insufficient documentation

## 2011-05-22 LAB — DIFFERENTIAL
Basophils Absolute: 0 10*3/uL (ref 0.0–0.1)
Lymphocytes Relative: 18 % (ref 12–46)
Lymphs Abs: 1.7 10*3/uL (ref 0.7–4.0)
Monocytes Absolute: 0.7 10*3/uL (ref 0.1–1.0)
Neutro Abs: 7.1 10*3/uL (ref 1.7–7.7)

## 2011-05-22 LAB — CBC
HCT: 41.9 % (ref 36.0–46.0)
Hemoglobin: 13.9 g/dL (ref 12.0–15.0)
MCHC: 33.2 g/dL (ref 30.0–36.0)

## 2011-05-22 LAB — URINALYSIS, ROUTINE W REFLEX MICROSCOPIC
Nitrite: POSITIVE — AB
Specific Gravity, Urine: 1.011 (ref 1.005–1.030)
pH: 6 (ref 5.0–8.0)

## 2011-05-22 LAB — BASIC METABOLIC PANEL
BUN: 21 mg/dL (ref 6–23)
Chloride: 97 mEq/L (ref 96–112)
GFR calc Af Amer: 59 mL/min — ABNORMAL LOW (ref >90)
Potassium: 3.9 mEq/L (ref 3.5–5.1)

## 2011-05-22 LAB — POCT I-STAT TROPONIN I: Troponin i, poc: 0.02 ng/mL (ref 0.00–0.08)

## 2011-05-22 LAB — APTT: aPTT: 27 seconds (ref 24–37)

## 2011-05-22 LAB — PROTIME-INR: INR: 0.98 (ref 0.00–1.49)

## 2011-05-22 LAB — URINE MICROSCOPIC-ADD ON

## 2011-05-24 LAB — URINE CULTURE

## 2011-05-24 NOTE — Consult Note (Signed)
[name], [name] NO.:  [id number]  MEDICAL RECORD NO.:  [id number]  LOCATION:  [facility]                         FACILITY:  [facility]  PHYSICIAN:  Verne Carrow, MDDATE OF BIRTH:  April 22, 1924  DATE OF CONSULTATION:  05/22/2011 DATE OF DISCHARGE:  05/22/2011                                CONSULTATION   PRIMARY CARE PHYSICIAN:  Tammy R. Collins Scotland, MD  PRIMARY CARDIOLOGIST:  Pricilla Riffle, MD, [facility]  REASON FOR CONSULTATION:  Tachycardia.  HISTORY OF PRESENT ILLNESS:  [name] is a delightful 77 year old Caucasian female with a history of coronary artery disease, diabetes mellitus, hypertension, hyperlipidemia, and anemia who went in to see her primary care physician, Dr. Herb Grays this morning with complaints of lower back pain and leg pain.  These complaints have been occurring over the last several weeks.  The patient also had some nausea.  When her vital signs were checked in [facility], her heart rate was greater than 100.  EKG showed tachycardia with question of whether or not this was atrial fibrillation or sinus tachycardia.  The patient really had no other complaints.  She was asked to come in to the [facility] Emergency Department for further evaluation.  When I am evaluating her this afternoon, she tells me that she feels well.  Her nausea has resolved.  She still has some pain in her back and leg which is chronic. She denies any chest pain or shortness of breath.  She tells me that she feels well.  EKG here in the emergency department shows sinus tachycardia with first-degree AV block.  Her telemetry clearly shows sinus rhythm with heart rate in the 80s.  PAST MEDICAL HISTORY: 1. Coronary artery disease, status post catheterization in February,     2012, which showed a patent stent in the mid left anterior     descending artery and minor disease in the other vessels. 2. Anemia. 3. Allergic rhinitis. 4. Diabetes mellitus. 5.  Hypertension. 6. Hyperlipidemia. 7. GERD. 8. Irritable bowel syndrome. 9. Osteoarthritis. 10.Spinal stenosis. 11.Multinodular goiter.  PAST SURGICAL HISTORY: 1. Cholecystectomy. 2. Abdominal hysterectomy. 3. Sinus surgery x2. 4. Bilateral arthroscopic knee surgery.  ALLERGIES:  AMOXICILLIN.  HOME MEDICATIONS: 1. Fosamax 70 mg by mouth once weekly. 2. Albuterol nebulizer p.r.n. 3. Norvasc 2.5 mg by mouth once daily. 4. Aspirin 81 mg by mouth once daily. 5. Neurontin 600 mg in the morning, 300 mg in the afternoon, and 900     mg at night. 6. Glipizide 10 mg p.o. once daily. 7. Hydrochlorothiazide 25 mg p.o. once daily. 8. Multivitamins once daily. 9. Crestor 20 mg p.o. at bedtime.  FAMILY HISTORY:  The patient's father and both brothers had coronary artery disease.  SOCIAL HISTORY:  The patient denies use of tobacco, alcohol, or illicit drugs.  REVIEW OF SYSTEMS:  As stated in the history of present illness is otherwise negative.  PHYSICAL EXAMINATION:  VITAL SIGNS:  Temperature 98.1, respiratory rate 12 and unlabored, pulse 88 and regular, blood pressure 131/87. GENERAL:  She is a pleasant, elderly Caucasian female in no acute distress.  She is alert and oriented x3. SKIN:  Warm and dry. MUSCULOSKELETAL:  Moves all extremities  equally. NEUROLOGICAL:  Nonfocal. PSYCHIATRIC:  Mood and affect are appropriate. HEENT:  Normal. NECK:  No JVD.  No carotid bruits.  No thyromegaly. LUNGS:  Clear to auscultation bilaterally without wheezes, rhonchi, or crackles noted.  CARDIOVASCULAR:  Regular rate and rhythm without murmurs, gallops, or rubs noted.  ABDOMEN:  Soft, nontender.  Bowel sounds are present. EXTREMITIES:  No evidence of edema.  All extremities are warm to touch.  DIAGNOSTIC STUDIES: 1. Twelve-lead EKG here in the emergency department shows sinus     tachycardia with first-degree AV block and chronic right bundle-     branch block. 2. Diagnostic studies  include a potassium of 3.9, creatinine 1.0, INR     1.0.  White blood cell count 9.6, hemoglobin 14, platelets 218 3. Chest x-ray shows no active lung disease.  ASSESSMENT AND PLAN:  This is a pleasant 77 year-year-old Caucasian female with a history of coronary artery disease, diabetes mellitus, hypertension, hyperlipidemia, who is in the emergency department with no complaints; however, she was found to have tachycardia earlier today. Based on the current EKG and review of the other EKGs, I did not feel that this was consistent with atrial fibrillation.  As stated, the patient has no complaints of palpitations chest pain, or shortness of breath.  She clearly has sinus rhythm on telemetry now and by a 12-lead EKG here in the emergency department.  I would not change her therapy at this time.  We will allow her to go home today, no further workup.  We will arrange a close followup in our Cardiology office with Dr. Tenny Craw in 2-3 days.     Verne Carrow, MD     CM/MEDQ  D:  05/22/2011  T:  05/22/2011  Job:  [id number]  cc:   Tammy R. Collins Scotland, M.D. Pricilla Riffle, MD, [facility]  Electronically Signed by Verne Carrow MD on 05/24/2011 10:59:42 AM

## 2011-05-29 ENCOUNTER — Other Ambulatory Visit: Payer: Self-pay | Admitting: Family Medicine

## 2011-05-29 DIAGNOSIS — N644 Mastodynia: Secondary | ICD-10-CM

## 2011-06-01 ENCOUNTER — Other Ambulatory Visit: Payer: Self-pay | Admitting: Internal Medicine

## 2011-06-01 DIAGNOSIS — I6529 Occlusion and stenosis of unspecified carotid artery: Secondary | ICD-10-CM

## 2011-06-04 ENCOUNTER — Encounter (INDEPENDENT_AMBULATORY_CARE_PROVIDER_SITE_OTHER): Payer: Medicare Other | Admitting: *Deleted

## 2011-06-04 DIAGNOSIS — I6529 Occlusion and stenosis of unspecified carotid artery: Secondary | ICD-10-CM

## 2011-06-06 ENCOUNTER — Encounter: Payer: Self-pay | Admitting: Physician Assistant

## 2011-06-06 ENCOUNTER — Ambulatory Visit (INDEPENDENT_AMBULATORY_CARE_PROVIDER_SITE_OTHER): Payer: Medicare Other | Admitting: Physician Assistant

## 2011-06-06 DIAGNOSIS — R42 Dizziness and giddiness: Secondary | ICD-10-CM

## 2011-06-06 DIAGNOSIS — I10 Essential (primary) hypertension: Secondary | ICD-10-CM

## 2011-06-06 DIAGNOSIS — I2510 Atherosclerotic heart disease of native coronary artery without angina pectoris: Secondary | ICD-10-CM

## 2011-06-06 NOTE — Patient Instructions (Signed)
Your physician recommends that you schedule a follow-up appointment in: 3 WEEKS WITH DR. ROSS PER PT REQUEST

## 2011-06-06 NOTE — Assessment & Plan Note (Signed)
No angina.  Recent LHC with stable disease.   Continue ASA.

## 2011-06-06 NOTE — Progress Notes (Signed)
History of Present Illness: PCP:  Dr. Herb Grays Primary Cardiologist:  Dr. Dietrich Pates   [name] is a 77 y.o. female who present for post ER visit follow up.  She has a h/o CAD, s/p prior Taxus DES to mLAD and subsequent cutting balloon angioplasty due to ISR in 2006.  Last LHC in 2/12 demonstrated LAD 40%, patent stent, prox and mid RCA 30% and EF 60%.  Last echo 03/2009: mild LVH, EF 55-60%, mild LAE.  She also has a h/o DM2, HTN, HLP, anemia, GERD, IBS, DJD and multinodular goiter.  She wore a holter in 5/12 for bradycardia but this was normal.  She was seen in the ED by Dr. Verne Carrow 10/30 for tachycardia.  She was seen in her PCPs office with back pain and nausea and noted to have a HR in 120s.  Afib was feared and she was sent to the ED.  Review of her ECGs demonstrated NSR and sinus tach.  Hgb 13.9, K 3.9, creatinine 0.98.  She was dx with a UTI and treated with antibxs.  Dr. Verne Carrow felt she could be discharged from the ED and follow up here today.  She saw her PCP recently and noted weak spells.   She had carotid dopplers done 2 days ago.  These demonstrate mild bilateral ICA stenosis (0-39%).  She notes several episodes of near syncope over the last year.  It is accompanied by diaphoresis.  She denies syncope.  She feels chest pain during these spells.  Denies symptoms like previous angina.  No dyspnea, orthopnea, PND or worsening edema.  Denies palpitations.  Worried about her HR being too high.  Past Medical History  Diagnosis Date  . Allergic rhinitis   . Anemia     NOS chronic disease. Hgb 11.6gm% 04/14/2009  . CAD (coronary artery disease)   . Diabetes mellitus type II   . Diverticulitis of colon   . GERD (gastroesophageal reflux disease)   . Hyperlipidemia   . HTN (hypertension)   . DM neuropathy, type II diabetes mellitus   . IBS (irritable bowel syndrome)   . Osteoarthritis   . Spinal stenosis     djd lumbar  . Multinodular goiter   .  Orthostatic hypotension   . Obesity     BMI-37  . Acute renal failure     due to dehydration-Sept 2009; Normal creat 1.2 on fu 05/13/2009  . Hx of colonoscopy     Current Outpatient Prescriptions  Medication Sig Dispense Refill  . acetaminophen (TYLENOL) 500 MG tablet 2 tabs as directed       . albuterol (PROAIR HFA) 108 (90 BASE) MCG/ACT inhaler Inhale 2 puffs into the lungs every 6 (six) hours as needed.        Marland Kitchen alendronate (FOSAMAX) 70 MG tablet Take 70 mg by mouth every 7 (seven) days. Take with a full glass of water on an empty stomach.       Marland Kitchen amLODipine (NORVASC) 2.5 MG tablet Take 2.5 mg by mouth daily.        . ARTIFICIAL TEARS ophthalmic solution 1 drop. 1 drop both eyes two times a day as needed       . aspirin 81 MG EC tablet Take 81 mg by mouth daily.        . Calcium Carbonate-Vitamin D (CALTRATE 600+D) 600-400 MG-UNIT per chew tablet Chew 1 tablet by mouth 2 (two) times daily.        . fish oil-omega-3  fatty acids 1000 MG capsule Take 2 g by mouth daily.        Marland Kitchen gabapentin (NEURONTIN) 300 MG capsule 2 tabs am-1 tab pm-3 tabs at bedtime      . glipiZIDE (GLUCOTROL) 10 MG tablet Take 10 mg by mouth daily.        . hydrochlorothiazide 25 MG tablet Take 25 mg by mouth daily.        . Multiple Vitamins-Minerals (CENTRUM SILVER PO) Take by mouth daily.        . nitroGLYCERIN (NITROSTAT) 0.4 MG SL tablet Place 0.4 mg under the tongue every 5 (five) minutes as needed.        . rosuvastatin (CRESTOR) 20 MG tablet Take 20 mg by mouth daily.        Marland Kitchen sulfamethoxazole-trimethoprim (BACTRIM DS) 800-160 MG per tablet Take 1 tablet by mouth 2 (two) times daily.  20 tablet  0  . traMADol (ULTRAM) 50 MG tablet Take 50 mg by mouth every 6 (six) hours as needed. Maximum dose= 8 tablets per day         Allergies: Allergies  Allergen Reactions  . Amoxicillin     History  Substance Use Topics  . Smoking status: Never Smoker   . Smokeless tobacco: Not on file  . Alcohol Use: No      ROS:  Please see the history of present illness.   All other systems reviewed and negative.   Vital Signs: BP 137/86  Pulse 93  Ht 5\' 3"  (1.6 m)  Wt 194 lb 12.8 oz (88.361 kg)  BMI 34.51 kg/m2  PHYSICAL EXAM: Well nourished, well developed, in no acute distress HEENT: normal Neck: no JVD Cardiac:  normal S1, S2; RRR; no murmur Lungs:  clear to auscultation bilaterally, no wheezing, rhonchi or rales Abd: soft, nontender, no hepatomegaly Ext: no edema Skin: warm and dry Neuro:  CNs 2-12 intact, no focal abnormalities noted  EKG:   NSR, HR 93, long 1st degree AVB (PR 316 ms), RBBB, no change when compared to prior  ASSESSMENT AND PLAN:

## 2011-06-06 NOTE — Assessment & Plan Note (Signed)
Controlled.  Continue current therapy.  

## 2011-06-06 NOTE — Assessment & Plan Note (Signed)
With her conduction system disease, I question if she is having significant pauses or bradycardia causing her symptoms.  Arrange 3 week event monitor.  I will have her follow up with Dr. Dietrich Pates in the next 3 weeks to review.  She knows to go to the ED if she passes out.

## 2011-06-07 ENCOUNTER — Emergency Department (HOSPITAL_COMMUNITY): Payer: Medicare Other

## 2011-06-07 ENCOUNTER — Inpatient Hospital Stay (HOSPITAL_COMMUNITY)
Admission: EM | Admit: 2011-06-07 | Discharge: 2011-06-08 | DRG: 313 | Disposition: A | Discharge status: Home / Self Care | Payer: Medicare Other | Attending: Cardiology | Admitting: Cardiology

## 2011-06-07 ENCOUNTER — Other Ambulatory Visit: Payer: Self-pay

## 2011-06-07 ENCOUNTER — Telehealth: Payer: Self-pay | Admitting: Internal Medicine

## 2011-06-07 ENCOUNTER — Encounter (HOSPITAL_COMMUNITY): Payer: Self-pay | Admitting: *Deleted

## 2011-06-07 DIAGNOSIS — Z79899 Other long term (current) drug therapy: Secondary | ICD-10-CM

## 2011-06-07 DIAGNOSIS — I2510 Atherosclerotic heart disease of native coronary artery without angina pectoris: Secondary | ICD-10-CM | POA: Diagnosis present

## 2011-06-07 DIAGNOSIS — I10 Essential (primary) hypertension: Secondary | ICD-10-CM | POA: Diagnosis present

## 2011-06-07 DIAGNOSIS — E1149 Type 2 diabetes mellitus with other diabetic neurological complication: Secondary | ICD-10-CM | POA: Diagnosis present

## 2011-06-07 DIAGNOSIS — K589 Irritable bowel syndrome without diarrhea: Secondary | ICD-10-CM | POA: Diagnosis present

## 2011-06-07 DIAGNOSIS — E042 Nontoxic multinodular goiter: Secondary | ICD-10-CM | POA: Diagnosis present

## 2011-06-07 DIAGNOSIS — R0789 Other chest pain: Principal | ICD-10-CM | POA: Diagnosis present

## 2011-06-07 DIAGNOSIS — Z6837 Body mass index (BMI) 37.0-37.9, adult: Secondary | ICD-10-CM

## 2011-06-07 DIAGNOSIS — K219 Gastro-esophageal reflux disease without esophagitis: Secondary | ICD-10-CM | POA: Diagnosis present

## 2011-06-07 DIAGNOSIS — R079 Chest pain, unspecified: Secondary | ICD-10-CM

## 2011-06-07 DIAGNOSIS — M47817 Spondylosis without myelopathy or radiculopathy, lumbosacral region: Secondary | ICD-10-CM | POA: Diagnosis present

## 2011-06-07 DIAGNOSIS — E119 Type 2 diabetes mellitus without complications: Secondary | ICD-10-CM | POA: Diagnosis present

## 2011-06-07 DIAGNOSIS — Z7982 Long term (current) use of aspirin: Secondary | ICD-10-CM

## 2011-06-07 DIAGNOSIS — E785 Hyperlipidemia, unspecified: Secondary | ICD-10-CM | POA: Diagnosis present

## 2011-06-07 DIAGNOSIS — E1142 Type 2 diabetes mellitus with diabetic polyneuropathy: Secondary | ICD-10-CM | POA: Diagnosis present

## 2011-06-07 DIAGNOSIS — D638 Anemia in other chronic diseases classified elsewhere: Secondary | ICD-10-CM | POA: Diagnosis present

## 2011-06-07 DIAGNOSIS — Z9861 Coronary angioplasty status: Secondary | ICD-10-CM

## 2011-06-07 DIAGNOSIS — J309 Allergic rhinitis, unspecified: Secondary | ICD-10-CM | POA: Diagnosis present

## 2011-06-07 DIAGNOSIS — E669 Obesity, unspecified: Secondary | ICD-10-CM | POA: Diagnosis present

## 2011-06-07 HISTORY — DX: Pneumonia, unspecified organism: J18.9

## 2011-06-07 HISTORY — DX: Malignant (primary) neoplasm, unspecified: C80.1

## 2011-06-07 LAB — BASIC METABOLIC PANEL
BUN: 22 mg/dL (ref 6–23)
Calcium: 9.3 mg/dL (ref 8.4–10.5)
Creatinine, Ser: 0.89 mg/dL (ref 0.50–1.10)
GFR calc non Af Amer: 57 mL/min — ABNORMAL LOW (ref >90)
Glucose, Bld: 179 mg/dL — ABNORMAL HIGH (ref 70–99)
Sodium: 136 mEq/L (ref 135–145)

## 2011-06-07 LAB — DIFFERENTIAL
Eosinophils Absolute: 0.1 10*3/uL (ref 0.0–0.7)
Eosinophils Relative: 3 % (ref 0–5)
Lymphs Abs: 1.3 10*3/uL (ref 0.7–4.0)
Monocytes Absolute: 0.4 10*3/uL (ref 0.1–1.0)
Monocytes Relative: 8 % (ref 3–12)

## 2011-06-07 LAB — CBC
HCT: 38.1 % (ref 36.0–46.0)
MCH: 32.2 pg (ref 26.0–34.0)
MCV: 89 fL (ref 78.0–100.0)
Platelets: 247 10*3/uL (ref 150–400)
RBC: 4.28 MIL/uL (ref 3.87–5.11)

## 2011-06-07 LAB — CARDIAC PANEL(CRET KIN+CKTOT+MB+TROPI)
Relative Index: INVALID (ref 0.0–2.5)
Total CK: 76 U/L (ref 7–177)
Troponin I: <0.3 ng/mL (ref <0.30)

## 2011-06-07 LAB — APTT: aPTT: 40 seconds — ABNORMAL HIGH (ref 24–37)

## 2011-06-07 LAB — PROTIME-INR
INR: 1.14 (ref 0.00–1.49)
Prothrombin Time: 14.8 seconds (ref 11.6–15.2)

## 2011-06-07 LAB — MRSA PCR SCREENING: MRSA by PCR: NEGATIVE

## 2011-06-07 MED ORDER — ASPIRIN EC 81 MG PO TBEC
81.0000 mg | DELAYED_RELEASE_TABLET | Freq: Every day | ORAL | Status: DC
  Administered 2011-06-07 – 2011-06-08 (×2): 81 mg via ORAL
  Filled 2011-06-07 (×2): qty 1

## 2011-06-07 MED ORDER — ALENDRONATE SODIUM 70 MG PO TABS: 70.0000 mg | ORAL_TABLET | ORAL | Status: DC

## 2011-06-07 MED ORDER — SODIUM CHLORIDE 0.9 % IJ SOLN: 3.0000 mL | INTRAMUSCULAR | Status: DC | PRN

## 2011-06-07 MED ORDER — HYDROCHLOROTHIAZIDE 25 MG PO TABS
25.0000 mg | ORAL_TABLET | Freq: Every day | ORAL | Status: DC
  Administered 2011-06-08: 25 mg via ORAL
  Filled 2011-06-07: qty 1

## 2011-06-07 MED ORDER — ASPIRIN 81 MG PO TBEC: 81.0000 mg | DELAYED_RELEASE_TABLET | Freq: Every day | ORAL | Status: DC

## 2011-06-07 MED ORDER — ACETAMINOPHEN 500 MG PO TABS
1000.0000 mg | ORAL_TABLET | Freq: Four times a day (QID) | ORAL | Status: DC | PRN
  Filled 2011-06-07: qty 2

## 2011-06-07 MED ORDER — ASPIRIN 81 MG PO CHEW
CHEWABLE_TABLET | ORAL | Status: AC
  Administered 2011-06-07: 324 mg
  Filled 2011-06-07: qty 4

## 2011-06-07 MED ORDER — OMEGA-3 FATTY ACIDS 1000 MG PO CAPS: 2.0000 g | ORAL_CAPSULE | Freq: Every day | ORAL | Status: DC

## 2011-06-07 MED ORDER — OMEGA-3-ACID ETHYL ESTERS 1 G PO CAPS
2.0000 g | ORAL_CAPSULE | Freq: Every day | ORAL | Status: DC
  Administered 2011-06-07 – 2011-06-08 (×2): 2 g via ORAL
  Filled 2011-06-07 (×2): qty 2

## 2011-06-07 MED ORDER — POLYVINYL ALCOHOL 1.4 % OP SOLN
1.0000 [drp] | OPHTHALMIC | Status: DC | PRN
  Filled 2011-06-07: qty 15

## 2011-06-07 MED ORDER — ARTIFICIAL TEARS OP SOLN: 1.0000 [drp] | Freq: Three times a day (TID) | OPHTHALMIC | Status: DC | PRN

## 2011-06-07 MED ORDER — SODIUM CHLORIDE 0.9 % IJ SOLN: 3.0000 mL | Freq: Two times a day (BID) | INTRAMUSCULAR | Status: DC

## 2011-06-07 MED ORDER — CALCIUM CARBONATE-VITAMIN D 600-400 MG-UNIT PO CHEW: 1.0000 | CHEWABLE_TABLET | Freq: Two times a day (BID) | ORAL | Status: DC

## 2011-06-07 MED ORDER — ALBUTEROL SULFATE HFA 108 (90 BASE) MCG/ACT IN AERS
2.0000 | INHALATION_SPRAY | RESPIRATORY_TRACT | Status: DC | PRN
  Filled 2011-06-07: qty 6.7

## 2011-06-07 MED ORDER — METOPROLOL TARTRATE 12.5 MG HALF TABLET
12.5000 mg | ORAL_TABLET | Freq: Two times a day (BID) | ORAL | Status: DC
  Administered 2011-06-07 – 2011-06-08 (×2): 12.5 mg via ORAL
  Filled 2011-06-07 (×3): qty 1

## 2011-06-07 MED ORDER — THERA M PLUS PO TABS
1.0000 | ORAL_TABLET | Freq: Every day | ORAL | Status: DC
  Administered 2011-06-08: 1 via ORAL
  Filled 2011-06-07: qty 1

## 2011-06-07 MED ORDER — HEPARIN BOLUS VIA INFUSION
3000.0000 [IU] | Freq: Once | INTRAVENOUS | Status: AC
  Administered 2011-06-07: 3000 [IU] via INTRAVENOUS
  Filled 2011-06-07: qty 3000

## 2011-06-07 MED ORDER — NITROGLYCERIN 0.4 MG SL SUBL
0.4000 mg | SUBLINGUAL_TABLET | SUBLINGUAL | Status: DC | PRN
  Administered 2011-06-07 (×2): 0.4 mg via SUBLINGUAL
  Filled 2011-06-07: qty 25

## 2011-06-07 MED ORDER — ASPIRIN 325 MG PO TABS: 325.0000 mg | ORAL_TABLET | Freq: Once | ORAL | Status: DC

## 2011-06-07 MED ORDER — AMLODIPINE BESYLATE 2.5 MG PO TABS
2.5000 mg | ORAL_TABLET | Freq: Every day | ORAL | Status: DC
  Administered 2011-06-07 – 2011-06-08 (×2): 2.5 mg via ORAL
  Filled 2011-06-07 (×2): qty 1

## 2011-06-07 MED ORDER — PANTOPRAZOLE SODIUM 40 MG PO TBEC
40.0000 mg | DELAYED_RELEASE_TABLET | Freq: Every day | ORAL | Status: DC
  Administered 2011-06-07 – 2011-06-08 (×2): 40 mg via ORAL
  Filled 2011-06-07 (×2): qty 1

## 2011-06-07 MED ORDER — HEPARIN (PORCINE) IN NACL 100-0.45 UNIT/ML-% IJ SOLN
INTRAMUSCULAR | Status: AC
  Administered 2011-06-07: 13.971 [IU]/kg/h via INTRAVENOUS
  Filled 2011-06-07: qty 250

## 2011-06-07 MED ORDER — NITROGLYCERIN 0.4 MG SL SUBL: 0.4000 mg | SUBLINGUAL_TABLET | SUBLINGUAL | Status: DC | PRN

## 2011-06-07 MED ORDER — TRAMADOL HCL 50 MG PO TABS: 50.0000 mg | ORAL_TABLET | Freq: Four times a day (QID) | ORAL | Status: DC | PRN

## 2011-06-07 MED ORDER — CENTRUM SILVER PO TABS: 1.0000 | ORAL_TABLET | Freq: Every day | ORAL | Status: DC

## 2011-06-07 MED ORDER — ROSUVASTATIN CALCIUM 20 MG PO TABS
20.0000 mg | ORAL_TABLET | Freq: Every day | ORAL | Status: DC
  Administered 2011-06-07: 20 mg via ORAL
  Filled 2011-06-07 (×2): qty 1

## 2011-06-07 MED ORDER — MORPHINE SULFATE 4 MG/ML IJ SOLN
4.0000 mg | Freq: Once | INTRAMUSCULAR | Status: AC
  Administered 2011-06-07: 4 mg via INTRAVENOUS
  Filled 2011-06-07: qty 1

## 2011-06-07 MED ORDER — ONDANSETRON HCL 4 MG/2ML IJ SOLN: 4.0000 mg | Freq: Four times a day (QID) | INTRAMUSCULAR | Status: DC | PRN

## 2011-06-07 MED ORDER — HEPARIN (PORCINE) IN NACL 100-0.45 UNIT/ML-% IJ SOLN
14.0000 [IU]/kg/h | INTRAMUSCULAR | Status: DC
  Administered 2011-06-07: 13.971 [IU]/kg/h via INTRAVENOUS
  Filled 2011-06-07: qty 250

## 2011-06-07 MED ORDER — CALCIUM CARBONATE-VITAMIN D 500-200 MG-UNIT PO TABS
1.0000 | ORAL_TABLET | Freq: Two times a day (BID) | ORAL | Status: DC
  Administered 2011-06-07: 1 via ORAL
  Administered 2011-06-08 (×2): via ORAL
  Filled 2011-06-07 (×4): qty 1

## 2011-06-07 MED ORDER — MORPHINE SULFATE 2 MG/ML IJ SOLN
2.0000 mg | INTRAMUSCULAR | Status: DC | PRN
  Administered 2011-06-07 (×2): 2 mg via INTRAVENOUS
  Filled 2011-06-07 (×2): qty 1

## 2011-06-07 MED ORDER — GABAPENTIN 300 MG PO CAPS
600.0000 mg | ORAL_CAPSULE | Freq: Two times a day (BID) | ORAL | Status: DC
  Administered 2011-06-07 – 2011-06-08 (×2): 600 mg via ORAL
  Filled 2011-06-07 (×3): qty 2

## 2011-06-07 MED ORDER — GLIPIZIDE 10 MG PO TABS
10.0000 mg | ORAL_TABLET | Freq: Every day | ORAL | Status: DC
  Administered 2011-06-08: 10 mg via ORAL
  Filled 2011-06-07 (×2): qty 1

## 2011-06-07 NOTE — Telephone Encounter (Signed)
Pt is calling regarding chest pain mid chest all night and today.  She describes it as a dull ache.  She states she has not taken any nitro.  It started while she was sleeping.  She had two episodes of "cold sweats", no sob, no passing out or feeling faint.  She states she does feel weak.  She states the pain is worse when she breathes.  She took three nitro while I was on the phone with her without relief of the symptoms.  I told her to call 911.

## 2011-06-07 NOTE — ED Notes (Signed)
Pt to ED for eval of midsternal/left sided chest pain, non radiating, that started last night. Pt reports taking 3 nitros at home, pt states pain was never completely relieved but did go from a 8/10 to a 3/10. No associated symptoms with chest pian. Pt reports she has had chest pain intermittently since the last week in October, pt with hx of stent placement. Pt is alert and oriented x 3. Neuro intact.

## 2011-06-07 NOTE — ED Notes (Signed)
Cardiology at bedside.

## 2011-06-07 NOTE — ED Notes (Signed)
Attempted report x 2, will call back in 10

## 2011-06-07 NOTE — ED Notes (Addendum)
Pt reports chest pain is 1.5/10 at this time. Pt reports she is feeling much better. Pt remains on monitor with family at bedside. Aware of poc and pending possible admission.

## 2011-06-07 NOTE — Progress Notes (Signed)
ANTICOAGULATION CONSULT NOTE - Initial Consult  Pharmacy Consult for Heparin Indication: USAP  Allergies  Allergen Reactions  . Amoxicillin Other (See Comments)    UNKNOWN    Patient Measurements: Height: 5\' 4"  (162.6 cm) Weight: 194 lb (87.998 kg) IBW/kg (Calculated) : 54.7  Adjusted Body Weight: 68  Vital Signs: Temp: 98 F (36.7 C) (11/15 1011) Temp src: Oral (11/15 1011) BP: 130/65 mmHg (11/15 1600) Pulse Rate: 81  (11/15 1600)  Labs:  Basename 06/07/11 1027  HGB 13.8  HCT 38.1  PLT 247  APTT --  LABPROT --  INR --  HEPARINUNFRC --  CREATININE 0.89  CKTOTAL --  CKMB --  TROPONINI --   Estimated Creatinine Clearance: 49.6 ml/min (by C-G formula based on Cr of 0.89).  Medical History: Past Medical History  Diagnosis Date  . Allergic rhinitis   . Anemia     NOS chronic disease. Hgb 11.6gm% 04/14/2009  . CAD (coronary artery disease)   . Diabetes mellitus type II   . Diverticulitis of colon   . GERD (gastroesophageal reflux disease)   . Hyperlipidemia   . HTN (hypertension)   . DM neuropathy, type II diabetes mellitus   . IBS (irritable bowel syndrome)   . Osteoarthritis   . Spinal stenosis     djd lumbar  . Multinodular goiter   . Orthostatic hypotension   . Obesity     BMI-37  . Acute renal failure     due to dehydration-Sept 2009; Normal creat 1.2 on fu 05/13/2009  . Hx of colonoscopy     Medications:   (Not in a hospital admission)  Assessment: 77 y/o female patient admitted with chest pain and significant cardiac history requiring anticoagulation to r/o MI. CE neg x1. Goal of Therapy:  Heparin level 0.3-0.7 units/ml   Plan:  Heparin 3000 unit iv bolus followed by infusion at 950 units/hr. Check 8 hour heparin level with daily cbc and heparin level.  Verlene Mayer, PharmD, BCPS Pager [phone]  06/07/2011,4:16 PM

## 2011-06-07 NOTE — ED Provider Notes (Signed)
History     CSN: [id number] Arrival date & time: 06/07/2011 10:02 AM   First MD Initiated Contact with Patient 06/07/11 1016      Chief Complaint  Patient presents with  . Chest Pain    (Consider location/radiation/quality/duration/timing/severity/associated sxs/prior treatment) HPI The patient is an 77 year old female with a history of coronary artery disease who presents complaining of chest pain since the middle of the night. She says his chest pain is worse has been a 9/10. Currently it is a 5/10. It is made better by nitroglycerin. She has not had aspirin today. Patient does have a history of prior stenting. She denies any shortness of breath, peripheral edema, cough, or fevers. There are no other associated or modifying factors. Past Medical History  Diagnosis Date  . Allergic rhinitis   . Anemia     NOS chronic disease. Hgb 11.6gm% 04/14/2009  . CAD (coronary artery disease)   . Diabetes mellitus type II   . Diverticulitis of colon   . GERD (gastroesophageal reflux disease)   . Hyperlipidemia   . HTN (hypertension)   . DM neuropathy, type II diabetes mellitus   . IBS (irritable bowel syndrome)   . Osteoarthritis   . Spinal stenosis     djd lumbar  . Multinodular goiter   . Orthostatic hypotension   . Obesity     BMI-37  . Acute renal failure     due to dehydration-Sept 2009; Normal creat 1.2 on fu 05/13/2009  . Hx of colonoscopy     Past Surgical History  Procedure Date  . Cholecystectomy   . Abdominal hysterectomy   . Nasal sinus surgery     x2  . Bilateral arthroscopic knee surgery   . Stent surgery     Family History  Problem Relation Age of Onset  . Breast cancer Sister   . Heart disease Brother   . Colon cancer Neg Hx     History  Substance Use Topics  . Smoking status: Never Smoker   . Smokeless tobacco: Not on file  . Alcohol Use: No    OB History    Grav Para Term Preterm Abortions TAB SAB Ect Mult Living                  Review of  Systems  Constitutional: Negative.   HENT: Negative.   Eyes: Negative.   Respiratory: Negative.   Cardiovascular: Positive for chest pain.  Gastrointestinal: Negative.   Genitourinary: Negative.   Musculoskeletal: Negative.   Skin: Negative.   Neurological: Negative.   Hematological: Negative.   Psychiatric/Behavioral: Negative.   All other systems reviewed and are negative.    Allergies  Amoxicillin  Home Medications   Current Outpatient Rx  Name Route Sig Dispense Refill  . ACETAMINOPHEN 500 MG PO TABS Oral Take 1,000 mg by mouth every 6 (six) hours as needed. FOR PAIN    . ALBUTEROL SULFATE HFA 108 (90 BASE) MCG/ACT IN AERS Inhalation Inhale 2 puffs into the lungs every 4 (four) hours as needed. SHORTNESS OF BREATH    . ALENDRONATE SODIUM 70 MG PO TABS Oral Take 70 mg by mouth every 7 (seven) days. Take with a full glass of water on an empty stomach. TAKES ON SUNDAYS    . AMLODIPINE BESYLATE 2.5 MG PO TABS Oral Take 2.5 mg by mouth daily.      . ARTIFICIAL TEARS OP SOLN Both Eyes Place 1 drop into both eyes 3 (three) times daily as needed. 1  drop both eyes two times a day as needed FOR DRY EYES    . ASPIRIN 81 MG PO TBEC Oral Take 81 mg by mouth daily.      Marland Kitchen CALCIUM CARBONATE-VITAMIN D 600-400 MG-UNIT PO CHEW Oral Chew 1 tablet by mouth 2 (two) times daily.      . OMEGA-3 FATTY ACIDS 1000 MG PO CAPS Oral Take 2 g by mouth daily.      Marland Kitchen GABAPENTIN 300 MG PO CAPS Oral Take 600 mg by mouth 2 (two) times daily.     Marland Kitchen GLIPIZIDE 10 MG PO TABS Oral Take 10 mg by mouth daily.      Marland Kitchen HYDROCHLOROTHIAZIDE 25 MG PO TABS Oral Take 25 mg by mouth daily.      . CENTRUM SILVER PO Oral Take by mouth daily.      Marland Kitchen NITROGLYCERIN 0.4 MG SL SUBL Sublingual Place 0.4 mg under the tongue every 5 (five) minutes as needed. FOR CHEST PAIN    . ROSUVASTATIN CALCIUM 20 MG PO TABS Oral Take 20 mg by mouth daily.      . SULFAMETHOXAZOLE-TRIMETHOPRIM 800-160 MG PO TABS Oral Take 1 tablet by mouth 2 (two)  times daily. FOR BLADDER INFECTION, STOPPED ON 05/26/11     . TRAMADOL HCL 50 MG PO TABS Oral Take 50 mg by mouth every 6 (six) hours as needed. Maximum dose= 8 tablets per day, FOR PAIN      BP 131/78  Pulse 72  Temp(Src) 98.2 F (36.8 C) (Oral)  Resp 19  Ht 5\' 4"  (1.626 m)  Wt 194 lb (87.998 kg)  BMI 33.30 kg/m2  SpO2 100%  Physical Exam  Nursing note and vitals reviewed. Constitutional: She is oriented to person, place, and time. She appears well-developed and well-nourished. No distress.  HENT:  Head: Normocephalic and atraumatic.  Eyes: Conjunctivae and EOM are normal. Pupils are equal, round, and reactive to light.  Neck: Normal range of motion.  Cardiovascular: Normal rate, regular rhythm, normal heart sounds and intact distal pulses.  Exam reveals no gallop and no friction rub.   No murmur heard. Pulmonary/Chest: Effort normal and breath sounds normal. No respiratory distress. She has no wheezes. She has no rales. She exhibits no tenderness.  Abdominal: Soft. Bowel sounds are normal. She exhibits no distension. There is no tenderness. There is no rebound and no guarding.  Musculoskeletal: Normal range of motion.  Neurological: She is alert and oriented to person, place, and time. No cranial nerve deficit. She exhibits normal muscle tone. Coordination normal.  Skin: Skin is warm and dry.  Psychiatric: She has a normal mood and affect.    ED Course  Procedures (including critical care time)  Labs Reviewed  CBC - Abnormal; Notable for the following:    MCHC 36.2 (*)    All other components within normal limits  BASIC METABOLIC PANEL - Abnormal; Notable for the following:    Glucose, Bld 179 (*)    GFR calc non Af Amer 57 (*)    GFR calc Af Amer 67 (*)    All other components within normal limits  DIFFERENTIAL  PRO B NATRIURETIC PEPTIDE  POCT I-STAT TROPONIN I  URINALYSIS, ROUTINE W REFLEX MICROSCOPIC  I-STAT TROPONIN I  HEPARIN LEVEL (UNFRACTIONATED)  CBC   Dg  Chest 2 View  06/07/2011  *RADIOLOGY REPORT*  Clinical Data: Severe right-sided chest pain.  Weakness. Hypertension and diabetes.  CHEST - 2 VIEW  Comparison:  05/22/2011  Findings:  The heart  size and mediastinal contours are within normal limits.  Both lungs are clear.  The visualized skeletal structures are unremarkable.  IMPRESSION: No active cardiopulmonary disease.  Original Report Authenticated By: Danae Orleans, M.D.    Date: 06/07/2011  Rate: 99  Rhythm: accelerated junctional  QRS Axis: indeterminate  Intervals: normal  ST/T Wave abnormalities: nonspecific T wave changes  Conduction Disutrbances:right bundle branch block  Narrative Interpretation:   Old EKG Reviewed: unchanged   1. Chest pain       MDM   The patient was evaluated by myself. She was given aspirin 325 mg as well as sublingual nitroglycerin. Workup for chest pain was ordered. Labs were reviewed in addition the EKG which was essentially unchanged. There are no significant findings on chest x-ray or laboratory studies. Patient did have continued pain require formal morphine IV. With this her pain decreased to 1/10. I spoke with lower cardiology who evaluated the patient and accepted her for admission. She'll be admitted to cardiology for further monitoring.       Cyndra Numbers, MD 06/07/11 [phone]

## 2011-06-07 NOTE — ED Notes (Signed)
Pt assisted to restroom. Pt aware of poc.

## 2011-06-07 NOTE — ED Notes (Signed)
Pt reports midsternal/left chest pain non radiating chest pain with no associated symptoms since last night.

## 2011-06-07 NOTE — ED Notes (Signed)
Pt reports no relief with nitro, EDP aware meds ordered

## 2011-06-07 NOTE — ED Notes (Signed)
Attempted report x 1, will call back in 5

## 2011-06-07 NOTE — ED Notes (Signed)
Pt reports 0/10 chest pain at this time, pt aware of poc

## 2011-06-07 NOTE — ED Notes (Signed)
Pt given food water. Family Victorino Dike is leaving and will be back [phone].

## 2011-06-07 NOTE — Telephone Encounter (Signed)
Agree. Arieonna Medine, PA-C   

## 2011-06-07 NOTE — H&P (Signed)
Patient ID: [name] MRN: [id number], DOB/AGE: [birth year]   Admit date: 06/07/2011 Date of Consult: 06/07/2011 2:31 PM  Primary Physician: Herb Grays M.D. Primary Cardiologist: Dietrich Pates M.D.  Pt. Profile: 77yof w/ h/o CAD (s/p DES mLAD in 2005 & subsequent cutting balloon angioplasty due to ISR 2006), HTN, HLD, DMII, and obesity presented to [facility] with substernal chest heaviness.  Patient Active Hospital Problem List: CHEST PAIN  Coronary artery disease  DIABETES MELLITUS, TYPE II HYPERLIPIDEMIA  HYPERTENSION   Past Medical History  Diagnosis Date  . Allergic rhinitis   . Anemia     NOS chronic disease. Hgb 11.6gm% 04/14/2009  . CAD (coronary artery disease)   . Diabetes mellitus type II   . Diverticulitis of colon   . GERD (gastroesophageal reflux disease)   . Hyperlipidemia   . HTN (hypertension)   . DM neuropathy, type II diabetes mellitus   . IBS (irritable bowel syndrome)   . Osteoarthritis   . Spinal stenosis     djd lumbar  . Multinodular goiter   . Orthostatic hypotension   . Obesity     BMI-37  . Acute renal failure     due to dehydration-Sept 2009; Normal creat 1.2 on fu 05/13/2009  . Hx of colonoscopy     Past Surgical History  Procedure Date  . Cholecystectomy   . Abdominal hysterectomy   . Nasal sinus surgery     x2  . Bilateral arthroscopic knee surgery   . Stent surgery     Allergies:  Allergies  Allergen Reactions  . Amoxicillin Other (See Comments)    UNKNOWN   HPI: 77yof w/ h/o CAD (s/p DES mLAD in 2005 & subsequent cutting balloon angioplasty due to ISR 2006), HTN, HLD, DMII, and obesity presented to [facility] with substernal chest heaviness. She reports waking up around 11pm last night with substernal chest heaviness, 7/10, with sharp pains radiating to right chest wall. No associated shortness of breath, diaphoresis, nausea, or dizziness. She sat in her recliner the rest of the night and says the pain continued but she may  have dozed off a few times. This morning she called the [place] office at which point she was reminded to take her SL nitro. She took 3 SL nitro without any relief and then had her granddaughter take her to the [facility]   Her last LHC in 2/12 demonstrated LAD 40%, patent stent, prox and mid RCA 30% and EF 60%. Last echo 03/2009: mild LVH, EF 55-60%, mild LAE.   According to Cardiology note from office visit yesterday, she saw her PCP recently and noted weak spells. She had carotid dopplers done 3 days ago that demonstrated mild bilateral ICA stenosis (0-39%). She noted several episodes of near syncope over the last year that is accompanied by diaphoresis, but denied syncope. She feels chest pain during these spells.  In the ED her EKG showed accelerated junctional rhythm vs sinus tachycardia with 1st degree AVB, 99bpm, with right bundle branch block that is unchanged from prior EKGs. No acute ST/T wave changes. BNP was 125, Poc troponin was negative and her cxr was without acute cardiopulmonary disease. She received 324mg  ASA and nitroglycerin x1 without relief. Morphine was administered and decreased her pain to 1/10.  Outpatient Medications:   Medications Prior to Admission  Medication Sig Dispense Refill  . acetaminophen (TYLENOL) 500 MG tablet Take 1,000 mg by mouth every 6 (six) hours as needed. FOR PAIN      . albuterol (  PROAIR HFA) 108 (90 BASE) MCG/ACT inhaler Inhale 2 puffs into the lungs every 4 (four) hours as needed. SHORTNESS OF BREATH      . alendronate (FOSAMAX) 70 MG tablet Take 70 mg by mouth every 7 (seven) days. Take with a full glass of water on an empty stomach. TAKES ON SUNDAYS      . amLODipine (NORVASC) 2.5 MG tablet Take 2.5 mg by mouth daily.        . ARTIFICIAL TEARS ophthalmic solution Place 1 drop into both eyes 3 (three) times daily as needed. 1 drop both eyes two times a day as needed FOR DRY EYES      . aspirin 81 MG EC tablet Take 81 mg by mouth daily.        . Calcium  Carbonate-Vitamin D (CALTRATE 600+D) 600-400 MG-UNIT per chew tablet Chew 1 tablet by mouth 2 (two) times daily.        . fish oil-omega-3 fatty acids 1000 MG capsule Take 2 g by mouth daily.        . gabapentin (NEURONTIN) 300 MG capsule Take 600 mg by mouth 2 (two) times daily.       . glipiZIDE (GLUCOTROL) 10 MG tablet Take 10 mg by mouth daily.        . hydrochlorothiazide 25 MG tablet Take 25 mg by mouth daily.        . Multiple Vitamins-Minerals (CENTRUM SILVER PO) Take by mouth daily.        . nitroGLYCERIN (NITROSTAT) 0.4 MG SL tablet Place 0.4 mg under the tongue every 5 (five) minutes as needed. FOR CHEST PAIN      . rosuvastatin (CRESTOR) 20 MG tablet Take 20 mg by mouth daily.        . traMADol (ULTRAM) 50 MG tablet Take 50 mg by mouth every 6 (six) hours as needed. Maximum dose= 8 tablets per day, FOR PAIN       Medications Prior to Admission  Medication Dose Route Frequency Provider Last Rate Last Dose  . aspirin 81 MG chewable tablet        32 4 mg at 06/07/11 1034  . morphine 4 MG/ML injection 4 mg  4 mg Intravenous Once Cyndra Numbers, MD   4 mg at 06/07/11 1129  . nitroGLYCERIN (NITROSTAT) SL tablet 0.4 mg  0.4 mg Sublingual Q5 min PRN Cyndra Numbers, MD   0.4 mg at 06/07/11 1034   Family History  Problem Relation Age of Onset  . Breast cancer Sister   . Heart disease Brother   . Colon cancer Neg Hx      History   Social History  . Marital Status: Married    Spouse Name: N/A    Number of Children: N/A  . Years of Education: N/A   Occupational History  . retired    Social History Main Topics  . Smoking status: Never Smoker   . Smokeless tobacco: Not on file  . Alcohol Use: No  . Drug Use: No  . Sexually Active: Not on file   Other Topics Concern  . Not on file   Social History Narrative  . No narrative on file    Review of Systems: General: intermittent cold sweats x 1 mo; otherwise negative for chills, fever,or weight changes.  Cardiovascular: As per HPI;  otherwise negative for dyspnea on exertion, edema, orthopnea, palpitations, paroxysmal nocturnal dyspnea or shortness of breath Dermatological: negative for rash Respiratory: negative for cough or wheezing Urologic: negative for hematuria  Abdominal: negative for nausea, vomiting, diarrhea, bright red blood per rectum, melena, or hematemesis Neurologic: As per HPI; otherwise negative for visual changes All other systems reviewed and are otherwise negative except as noted above.  Physical Exam: Blood pressure 125/67, pulse 76, temperature 98 F (36.7 C), temperature source Oral, resp. rate 16, height 5\' 4"  (1.626 m), weight 87.998 kg (194 lb), SpO2 96.00%.    General: Pleasant, overweight, elderly female; Well developed, well nourished, in no acute distress. Neck: Supple. Negative for carotid bruits. No JVD. Lungs: Clear bilaterally to auscultation without wheezes, rales, or rhonchi. Breathing is unlabored. Heart: RRR with S1 S2. No murmurs, rubs, or gallops appreciated. Abdomen: Soft, non-tender, non-distended with normoactive bowel sounds. No rebound/guarding. No obvious abdominal masses. Extremities: TED hose in place bilaterally; No clubbing, cyanosis or edema noted.  Distal pedal pulses are 2+ and equal bilaterally. Neuro: Alert and oriented X 3. Moves all extremities spontaneously. Psych:  Responds to questions appropriately with a normal affect.  Labs:  Lab Results  Component Value Date   WBC 5.2 06/07/2011   HGB 13.8 06/07/2011   HCT 38.1 06/07/2011   MCV 89.0 06/07/2011   PLT 247 06/07/2011     Lab 06/07/11 1027  NA 136  K 3.7  CL 98  CO2 24  BUN 22  CREATININE 0.89  CALCIUM 9.3  GLUCOSE 179*    POC Troponin 0.001 BNP: 125   Radiology/Studies:  Dg Chest 2 View 06/07/2011  Findings:  The heart size and mediastinal contours are within normal limits.  Both lungs are clear.  The visualized skeletal structures are unremarkable.  IMPRESSION: No active cardiopulmonary  disease.    EKG: Accelerated junctional rhythm vs sinus tachycardia with 1st degree AVB, 99bpm, with right bundle branch block that is unchanged from prior EKGs. No acute ST/T wave changes  ASSESSMENT AND PLAN:  1. Chest pain w/ h/o CAD: Currently c/o 1/10 chest heaviness, EKG without signs of acute ischemia, troponin negative x1. Due to cardiac history and multiple risk factors, will admit to rule out acute cardiac ischemia. Cycle cardiac enzymes, monitor on telemetry, follow EKGs. Cont ASA, statin, and add low dose BB. Anticoagulate with Heparin gtt. Morphine for pain control. Isn't taking a PPI at home but reports taking TUMS occasionally, will add a PPI. 2. HYPERTENSION: Stable. Cont current home regimen. 3. HYPERLIPIDEMIA: Check lipid panel. Cont home statin  4. DIABETES MELLITUS, TYPE II: Cont Glipizide    Signed, HOPE, JESSICA, PA-C 06/07/2011, 2:31 PM Patient seen and examined and history reviewed. Agree with above findings and plan. Patient's exam unremarkable. Lungs clear. RRR without murmur or gallop. No chest or abdominal pain to palpation. History unclear. Chest pain concerning for angina but patient appears in no distress. Similar symptoms when she had her last cath earlier this year. Will cycle cardiac enzymes. IV heparin. Start PPI. If enzymes positive will need cardiac cath otherwise will defer to Dr. Tenny Craw.   Thedora Hinders 06/07/2011 5:58 PM

## 2011-06-07 NOTE — Telephone Encounter (Signed)
Pt calling about test results please call her back

## 2011-06-07 NOTE — Progress Notes (Signed)
Pt complained of chest tightness that radiated to her back. She stated it was 4 or 5 out of 10. Vs stable BP 120/83. 02 sats 94% on 2l. Respiration 18. Hr 67 ( 1st HB). 3 sl. Nitro were administered to the patient which did not relieve her pain. An ekg was also done on the pt. Md Dr. Gala Romney made aware. New orders received to administer 2 mg of morphine IV and to get a stat set of cardiac enzymes. Morphine relieved the pt of her pain. Will cont to monitor pt closely.

## 2011-06-08 ENCOUNTER — Other Ambulatory Visit: Payer: Self-pay

## 2011-06-08 ENCOUNTER — Ambulatory Visit: Payer: Medicare Other | Admitting: Internal Medicine

## 2011-06-08 LAB — BASIC METABOLIC PANEL
Calcium: 9 mg/dL (ref 8.4–10.5)
Creatinine, Ser: 1.01 mg/dL (ref 0.50–1.10)
GFR calc Af Amer: 57 mL/min — ABNORMAL LOW (ref >90)
GFR calc non Af Amer: 49 mL/min — ABNORMAL LOW (ref >90)

## 2011-06-08 LAB — LIPID PANEL
HDL: 57 mg/dL (ref >39)
LDL Cholesterol: 47 mg/dL (ref 0–99)
Total CHOL/HDL Ratio: 2.2 RATIO
Triglycerides: 97 mg/dL (ref <150)
VLDL: 19 mg/dL (ref 0–40)

## 2011-06-08 LAB — CBC
Hemoglobin: 12.8 g/dL (ref 12.0–15.0)
MCHC: 34.8 g/dL (ref 30.0–36.0)
Platelets: 208 10*3/uL (ref 150–400)

## 2011-06-08 LAB — HEPARIN LEVEL (UNFRACTIONATED): Heparin Unfractionated: 0.19 IU/mL — ABNORMAL LOW (ref 0.30–0.70)

## 2011-06-08 LAB — TSH: TSH: 1.584 u[IU]/mL (ref 0.350–4.500)

## 2011-06-08 LAB — GLUCOSE, CAPILLARY
Glucose-Capillary: 108 mg/dL — ABNORMAL HIGH (ref 70–99)
Glucose-Capillary: 92 mg/dL (ref 70–99)

## 2011-06-08 LAB — CARDIAC PANEL(CRET KIN+CKTOT+MB+TROPI)
Relative Index: INVALID (ref 0.0–2.5)
Total CK: 71 U/L (ref 7–177)

## 2011-06-08 LAB — D-DIMER, QUANTITATIVE: D-Dimer, Quant: 1.25 ug/mL-FEU — ABNORMAL HIGH (ref 0.00–0.48)

## 2011-06-08 MED ORDER — HEPARIN BOLUS VIA INFUSION
2000.0000 [IU] | Freq: Once | INTRAVENOUS | Status: AC
  Administered 2011-06-08: 2000 [IU] via INTRAVENOUS
  Filled 2011-06-08: qty 2000

## 2011-06-08 MED ORDER — HEPARIN (PORCINE) IN NACL 100-0.45 UNIT/ML-% IJ SOLN
1350.0000 [IU]/h | INTRAMUSCULAR | Status: DC
  Administered 2011-06-08: 1200 [IU]/h via INTRAVENOUS
  Filled 2011-06-08 (×2): qty 250

## 2011-06-08 NOTE — Progress Notes (Signed)
ANTICOAGULATION CONSULT NOTE - Follow Up Consult  Pharmacy Consult for Heparin Indication: chest pain/ACS  Allergies  Allergen Reactions  . Amoxicillin Other (See Comments)    UNKNOWN    Patient Measurements: Height: 5\' 4"  (162.6 cm) Weight: 194 lb (87.998 kg) IBW/kg (Calculated) : 54.7   Vital Signs: Temp: 98.2 F (36.8 C) (11/16 0500) Temp src: Oral (11/16 0500) BP: 108/70 mmHg (11/16 0500) Pulse Rate: 86  (11/16 0500)  Labs:  Basename 06/08/11 0917 06/08/11 0628 06/08/11 0006 06/07/11 2120 06/07/11 2041 06/07/11 1027  HGB -- 12.8 -- -- -- 13.8  HCT -- 36.8 -- -- -- 38.1  PLT -- 208 -- -- -- 247  APTT -- -- -- -- 40* --  LABPROT -- -- -- -- 14.8 --  INR -- -- -- -- 1.14 --  HEPARINUNFRC 0.26* -- 0.19* -- -- --  CREATININE -- 1.01 -- -- -- 0.89  CKTOTAL 71 71 -- 75 -- --  CKMB 2.5 2.6 -- 2.6 -- --  TROPONINI <0.30 <0.30 -- <0.30 -- --   Estimated Creatinine Clearance: 43.7 ml/min (by C-G formula based on Cr of 1.01).   Medications:  Prescriptions prior to admission  Medication Sig Dispense Refill  . acetaminophen (TYLENOL) 500 MG tablet Take 1,000 mg by mouth every 6 (six) hours as needed. FOR PAIN      . albuterol (PROAIR HFA) 108 (90 BASE) MCG/ACT inhaler Inhale 2 puffs into the lungs every 4 (four) hours as needed. SHORTNESS OF BREATH      . alendronate (FOSAMAX) 70 MG tablet Take 70 mg by mouth every 7 (seven) days. Take with a full glass of water on an empty stomach. TAKES ON SUNDAYS      . amLODipine (NORVASC) 2.5 MG tablet Take 2.5 mg by mouth daily.        . ARTIFICIAL TEARS ophthalmic solution Place 1 drop into both eyes 3 (three) times daily as needed. 1 drop both eyes two times a day as needed FOR DRY EYES      . aspirin 81 MG EC tablet Take 81 mg by mouth daily.        . Calcium Carbonate-Vitamin D (CALTRATE 600+D) 600-400 MG-UNIT per chew tablet Chew 1 tablet by mouth 2 (two) times daily.        . fish oil-omega-3 fatty acids 1000 MG capsule Take 2 g  by mouth daily.        Marland Kitchen gabapentin (NEURONTIN) 300 MG capsule Take 600 mg by mouth 2 (two) times daily.       Marland Kitchen glipiZIDE (GLUCOTROL) 10 MG tablet Take 10 mg by mouth daily.        . hydrochlorothiazide 25 MG tablet Take 25 mg by mouth daily.        . Multiple Vitamins-Minerals (CENTRUM SILVER PO) Take by mouth daily.        . nitroGLYCERIN (NITROSTAT) 0.4 MG SL tablet Place 0.4 mg under the tongue every 5 (five) minutes as needed. FOR CHEST PAIN      . rosuvastatin (CRESTOR) 20 MG tablet Take 20 mg by mouth daily.        Marland Kitchen sulfamethoxazole-trimethoprim (BACTRIM DS,SEPTRA DS) 800-160 MG per tablet Take 1 tablet by mouth 2 (two) times daily. FOR BLADDER INFECTION, STOPPED ON 05/26/11       . traMADol (ULTRAM) 50 MG tablet Take 50 mg by mouth every 6 (six) hours as needed. Maximum dose= 8 tablets per day, FOR PAIN  Scheduled:    . amLODipine  2.5 mg Oral Daily  . aspirin EC  81 mg Oral Daily  . calcium-vitamin D  1 tablet Oral BID WC  . gabapentin  600 mg Oral BID  . glipiZIDE  10 mg Oral Q breakfast  . heparin  2,000 Units Intravenous Once  . heparin  3,000 Units Intravenous Once  . hydrochlorothiazide  25 mg Oral Daily  . metoprolol tartrate  12.5 mg Oral BID  .  morphine injection  4 mg Intravenous Once  . multivitamins ther. w/minerals  1 tablet Oral Daily  . omega-3 acid ethyl esters  2 g Oral Daily  . pantoprazole  40 mg Oral Q0600  . rosuvastatin  20 mg Oral QHS  . sodium chloride  3 mL Intravenous Q12H  . DISCONTD: alendronate  70 mg Oral Q7 days  . DISCONTD: aspirin  81 mg Oral Daily  . DISCONTD: Calcium Carbonate-Vitamin D  1 tablet Oral BID  . DISCONTD: CENTRUM SILVER  1 tablet Oral Daily  . DISCONTD: fish oil-omega-3 fatty acids  2 g Oral Daily    Assessment: 77 yo F with  CAD and DM admitted with chest pain.  Patient Active Problem List  Diagnoses  . GOITER  . DIABETES MELLITUS, TYPE II  . DIABETIC PERIPHERAL NEUROPATHY  . HYPERLIPIDEMIA  . DEHYDRATION  .  OBESITY  . ANEMIA-NOS  . PERIPHERAL NEUROPATHY  . HYPERTENSION  . BRADYCARDIA  . POSTURAL HYPOTENSION  . ACUTE BRONCHITIS  . Allergic Rhinitis, Cause Unspecified  . C O P D  . PULMONARY NODULE, RIGHT UPPER LOBE  . GERD  . HIATAL HERNIA  . DIVERTICULOSIS, COLON  . OSTEOARTHRITIS  . SPINAL STENOSIS  . PERIPHERAL EDEMA  . HOARSENESS, CHRONIC  . SHORTNESS OF BREATH (SOB)  . CHEST PAIN  . CHEST PAIN-PAINFUL RESPIRATION  . ABDOMINAL PAIN, LOWER  . Coronary artery disease  . Near Syncope   Pharmacy Problem List: CAD: On heparin while ruling out MI, remains subtherapeutic despite rate increased to 1200 units/hr. Will give small bolus and increase rate.  Goal of Therapy:  Heparin level 0.3-0.7 units/ml   Plan:  1. Heparin 2000 units IV x 1 then increase heparin rate to 1350 units/hr 2. Check heparin level 8h s/p rate increase     [name] [name] 06/08/2011,10:49 AM

## 2011-06-08 NOTE — Progress Notes (Signed)
ANTICOAGULATION CONSULT NOTE - Follow Up Consult  Pharmacy Consult for heparin Indication: USAP  Allergies  Allergen Reactions  . Amoxicillin Other (See Comments)    UNKNOWN    Patient Measurements: Height: 5\' 4"  (162.6 cm) Weight: 194 lb (87.998 kg) IBW/kg (Calculated) : 54.7  Adjusted Body Weight: 74.3  Vital Signs: Temp: 97.9 F (36.6 C) (11/15 2100) Temp src: Oral (11/15 2100) BP: 120/70 mmHg (11/15 2100) Pulse Rate: 79  (11/15 2100)  Labs:  Basename 06/08/11 0006 06/07/11 2120 06/07/11 2041 06/07/11 1027  HGB -- -- -- 13.8  HCT -- -- -- 38.1  PLT -- -- -- 247  APTT -- -- 40* --  LABPROT -- -- 14.8 --  INR -- -- 1.14 --  HEPARINUNFRC 0.19* -- -- --  CREATININE -- -- -- 0.89  CKTOTAL -- 75 76 --  CKMB -- 2.6 2.6 --  TROPONINI -- <0.30 <0.30 --   Estimated Creatinine Clearance: 49.6 ml/min (by C-G formula based on Cr of 0.89).   Medications:  Scheduled:    . amLODipine  2.5 mg Oral Daily  . aspirin      . aspirin EC  81 mg Oral Daily  . calcium-vitamin D  1 tablet Oral BID WC  . gabapentin  600 mg Oral BID  . glipiZIDE  10 mg Oral Q breakfast  . heparin  2,000 Units Intravenous Once  . heparin  3,000 Units Intravenous Once  . hydrochlorothiazide  25 mg Oral Daily  . metoprolol tartrate  12.5 mg Oral BID  .  morphine injection  4 mg Intravenous Once  . multivitamins ther. w/minerals  1 tablet Oral Daily  . omega-3 acid ethyl esters  2 g Oral Daily  . pantoprazole  40 mg Oral Q0600  . rosuvastatin  20 mg Oral QHS  . sodium chloride  3 mL Intravenous Q12H  . DISCONTD: alendronate  70 mg Oral Q7 days  . DISCONTD: aspirin  81 mg Oral Daily  . DISCONTD: aspirin  325 mg Oral Once  . DISCONTD: Calcium Carbonate-Vitamin D  1 tablet Oral BID  . DISCONTD: CENTRUM SILVER  1 tablet Oral Daily  . DISCONTD: fish oil-omega-3 fatty acids  2 g Oral Daily   Infusions:    . heparin    . DISCONTD: heparin 13.971 Units/kg/hr (06/07/11 1552)    Assessment: 77yo  female subtherapeutic on heparin with initial dosing for USAP.  Goal of Therapy:  Heparin level 0.3-0.7 units/ml   Plan:  Will give small bolus of 2000 units and increase gtt by ~3-4 units/hr to 1200 units/hr.  Will check level in 8hr.  Colleen Can PharmD BCPS 06/08/2011,1:20 AM

## 2011-06-08 NOTE — Progress Notes (Signed)
Subjective: Patient currently pain free.  No SOB> Objective: Filed Vitals:   06/07/11 1752 06/07/11 2016 06/07/11 2100 06/08/11 0500  BP: 133/78 128/78 120/70 108/70  Pulse: 88  79 86  Temp: 97.8 F (36.6 C)  97.9 F (36.6 C) 98.2 F (36.8 C)  TempSrc:   Oral Oral  Resp: 20  20 20   Height:      Weight:      SpO2: 95%  99% 98%   Weight change:   Intake/Output Summary (Last 24 hours) at 06/08/11 1140 Last data filed at 06/08/11 0600  Gross per 24 hour  Intake      0 ml  Output    500 ml  Net   -500 ml    General: Alert, awake, oriented x3, in no acute distress.  HEENT: No bruits, no goiter.  Heart: Regular rate and rhythm, without murmurs, rubs, gallops.  Lungs: Crackles left side, bilateral air movement.  Abdomen: Soft, nontender, nondistended, positive bowel sounds.  Neuro: Grossly intact, nonfocal.   Lab Results: Results for orders placed during the hospital encounter of 06/07/11 (from the past 24 hour(s))  MRSA PCR SCREENING     Status: Normal   Collection Time   06/07/11  6:08 PM      Component Value Range   MRSA by PCR NEGATIVE  NEGATIVE   CARDIAC PANEL(CRET KIN+CKTOT+MB+TROPI)     Status: Normal   Collection Time   06/07/11  8:41 PM      Component Value Range   Total CK 76  7 - 177 (U/L)   CK, MB 2.6  0.3 - 4.0 (ng/mL)   Troponin I <0.30  <0.30 (ng/mL)   Relative Index RELATIVE INDEX IS INVALID  0.0 - 2.5   PROTIME-INR     Status: Normal   Collection Time   06/07/11  8:41 PM      Component Value Range   Prothrombin Time 14.8  11.6 - 15.2 (seconds)   INR 1.14  0.00 - 1.49   APTT     Status: Abnormal   Collection Time   06/07/11  8:41 PM      Component Value Range   aPTT 40 (*) 24 - 37 (seconds)  TSH     Status: Normal   Collection Time   06/07/11  8:41 PM      Component Value Range   TSH 1.584  0.350 - 4.500 (uIU/mL)  CARDIAC PANEL(CRET KIN+CKTOT+MB+TROPI)     Status: Normal   Collection Time   06/07/11  9:20 PM      Component Value Range   Total CK 75  7 - 177 (U/L)   CK, MB 2.6  0.3 - 4.0 (ng/mL)   Troponin I <0.30  <0.30 (ng/mL)   Relative Index RELATIVE INDEX IS INVALID  0.0 - 2.5   HEPARIN LEVEL (UNFRACTIONATED)     Status: Abnormal   Collection Time   06/08/11 12:06 AM      Component Value Range   Heparin Unfractionated 0.19 (*) 0.30 - 0.70 (IU/mL)  CBC     Status: Normal   Collection Time   06/08/11  6:28 AM      Component Value Range   WBC 5.5  4.0 - 10.5 (K/uL)   RBC 4.05  3.87 - 5.11 (MIL/uL)   Hemoglobin 12.8  12.0 - 15.0 (g/dL)   HCT 91.4  78.2 - 95.6 (%)   MCV 90.9  78.0 - 100.0 (fL)   MCH 31.6  26.0 - 34.0 (pg)  MCHC 34.8  30.0 - 36.0 (g/dL)   RDW 24.4  01.0 - 27.2 (%)   Platelets 208  150 - 400 (K/uL)  CARDIAC PANEL(CRET KIN+CKTOT+MB+TROPI)     Status: Normal   Collection Time   06/08/11  6:28 AM      Component Value Range   Total CK 71  7 - 177 (U/L)   CK, MB 2.6  0.3 - 4.0 (ng/mL)   Troponin I <0.30  <0.30 (ng/mL)   Relative Index RELATIVE INDEX IS INVALID  0.0 - 2.5   BASIC METABOLIC PANEL     Status: Abnormal   Collection Time   06/08/11  6:28 AM      Component Value Range   Sodium 137  135 - 145 (mEq/L)   Potassium 3.4 (*) 3.5 - 5.1 (mEq/L)   Chloride 102  96 - 112 (mEq/L)   CO2 28  19 - 32 (mEq/L)   Glucose, Bld 131 (*) 70 - 99 (mg/dL)   BUN 19  6 - 23 (mg/dL)   Creatinine, Ser 5.36  0.50 - 1.10 (mg/dL)   Calcium 9.0  8.4 - 64.4 (mg/dL)   GFR calc non Af Amer 49 (*) >90 (mL/min)   GFR calc Af Amer 57 (*) >90 (mL/min)  LIPID PANEL     Status: Normal   Collection Time   06/08/11  6:28 AM      Component Value Range   Cholesterol 123  0 - 200 (mg/dL)   Triglycerides 97  <034 (mg/dL)   HDL 57  >74 (mg/dL)   Total CHOL/HDL Ratio 2.2     VLDL 19  0 - 40 (mg/dL)   LDL Cholesterol 47  0 - 99 (mg/dL)  GLUCOSE, CAPILLARY     Status: Abnormal   Collection Time   06/08/11  7:58 AM      Component Value Range   Glucose-Capillary 136 (*) 70 - 99 (mg/dL)  CARDIAC PANEL(CRET  KIN+CKTOT+MB+TROPI)     Status: Normal   Collection Time   06/08/11  9:17 AM      Component Value Range   Total CK 71  7 - 177 (U/L)   CK, MB 2.5  0.3 - 4.0 (ng/mL)   Troponin I <0.30  <0.30 (ng/mL)   Relative Index RELATIVE INDEX IS INVALID  0.0 - 2.5   HEPARIN LEVEL (UNFRACTIONATED)     Status: Abnormal   Collection Time   06/08/11  9:17 AM      Component Value Range   Heparin Unfractionated 0.26 (*) 0.30 - 0.70 (IU/mL)    Studies/Results: Dg Chest 2 View  06/07/2011  *RADIOLOGY REPORT*  Clinical Data: Severe right-sided chest pain.  Weakness. Hypertension and diabetes.  CHEST - 2 VIEW  Comparison:  05/22/2011  Findings:  The heart size and mediastinal contours are within normal limits.  Both lungs are clear.  The visualized skeletal structures are unremarkable.  IMPRESSION: No active cardiopulmonary disease.  Original Report Authenticated By: Danae Orleans, M.D.    Medications: I have reviewed the patient's current medications.   Patient Active Hospital Problem List: CHEST PAIN (12/03/2007)   Assessment: [facility]One.  Ruled out for MI.  Patient had continuous pain for an extended period without enzyme bump  Note increased HR at times.  Question PAT.  Short bursts  Aysmptomatic during.     Plan: I would check D dimer as relative notes increased fatiguablitly.  If neg, d/c heparin and ambulate. DIABETES MELLITUS, TYPE II (02/12/2007)   Assessment:  Plan:  HYPERLIPIDEMIA (02/12/2007)   Assessment:   Plan: Continue meds. HYPERTENSION (02/12/2007)   Assessment: Adequate control   Plan: Continue meds. Coronary artery disease (04/05/2011)   Assessment:  CAD-- DES to m LAD 2005.  Cutting balloon ISR 2006.  If walking witout symptoms will plan outpatient myoview.   Plan: As noted abov.e   LOS: 1 day   Dietrich Pates 06/08/2011, 11:40 AM

## 2011-06-08 NOTE — Discharge Summary (Signed)
Physician Discharge Summary  Patient ID: [name],  MRN: [id number], DOB/AGE: 1924-02-14 77 y.o.  Admit date: 06/07/2011 Discharge date: 06/08/2011  Primary Discharge Diagnosis:  1. Chest pain felt likely noncardiac, for outpatient Myoview 2. CAD   - s/p DES mLAD in 2005   - s/p subsequent cutting balloon angioplasty due to ISR 2006  - last LHC in 2/12 demonstrated LAD 40%, patent stent, prox and mid RCA 30% and EF 60%  - last echo 03/2009: mild LVH, EF 55-60%, mild LAE.  3. Chronically elevated d-dimer  Secondary Discharge Diagnosis:  1. Allergic rhinitis     2. Anemia NOS / chronic disease   3. Diabetes mellitus type II with neuropathy 4. Diverticulitis of colon     5. GERD (gastroesophageal reflux disease)     6.  Hyperlipidemia     7.  HTN (hypertension)     8. IBS 9. Osteoarthritis    /  Spinal stenosis   - djd lumbar   10. Multinodular goiter     11. Orthostatic hypotension     12. Obesity    13. Acute renal failure due to dehydration-Sept 2009 14. Cholecystectomy     15. Abdominal hysterectomy     16. Nasal sinus surgery x2   17. Bilateral arthroscopic knee surgery      Hospital Course: 77 y/o F with hx CAD (s/p DES mLAD in 2005 & subsequent cutting balloon angioplasty due to ISR 2006), HTN, HLD, DMII, and obesity presented to [facility] with substernal chest heaviness. She reported waking up with substernal chest heaviness with sharp pains radiating to the right chest wall without associated shortness of breath, diaphoresis, nausea, or dizziness. She sat in her recliner the rest of the night and says the pain continued but she may have dozed off a few times. In the morning, she took 3 SL nitro without any relief and then had her granddaughter take her to the [facility]. In the ED her EKG showed accelerated junctional rhythm vs sinus tachycardia with 1st degree AVB, 99bpm, with right bundle branch block that is unchanged from prior EKGs. No acute ST/T wave changes. BNP was 125,  Poc troponin was negative and her cxr was without acute cardiopulmonary disease. She received 324mg  ASA and nitroglycerin x1 without relief. Morphine was administered and decreased her pain to 1/10. Chest pain appeared concerning for angina so the patient was admitted for rule-out. IV heparin was started. Troponins remained negative despite extended period of pain, so her pain was felt less likely cardiac in etiology. The patient ambulated today without symptoms. Please note the patient had an elevated d-dimer. Per discussion with Dr. Tenny Craw, has a history of such with associated negative CTs and Dr. Tenny Craw does not feel any further workup is warranted for PE at this time, and would like her to have an outpatient Myoview. Dr. Tenny Craw has seen & examined the patient today and feels she is stable for discharge.  Discharge Vitals: Blood pressure 100/55, pulse 58, temperature 98.3 F (36.8 C), temperature source Oral, resp. rate 20, height 5\' 4"  (1.626 m), weight 194 lb (87.998 kg), SpO2 97.00%.  Labs: Lab Results  Component Value Date   WBC 5.5 06/08/2011   HGB 12.8 06/08/2011   HCT 36.8 06/08/2011   MCV 90.9 06/08/2011   PLT 208 06/08/2011    Lab 06/08/11 0628  NA 137  K 3.4*  CL 102  CO2 28  BUN 19  CREATININE 1.01  CALCIUM 9.0  PROT --  BILITOT --  ALKPHOS --  ALT --  AST --  GLUCOSE 131*    Basename 06/08/11 0917 06/08/11 0628 06/07/11 2120 06/07/11 2041  CKTOTAL 71 71 75 76  CKMB 2.5 2.6 2.6 2.6  TROPONINI <0.30 <0.30 <0.30 <0.30   Lab Results  Component Value Date   CHOL 123 06/08/2011   HDL 57 06/08/2011   LDLCALC 47 06/08/2011   TRIG 97 06/08/2011   Lab Results  Component Value Date   DDIMER 1.25* 06/08/2011    Diagnostic Studies/Procedures:   1. Dg Chest 2 View -06/07/2011  *RADIOLOGY REPORT*  Clinical Data: Severe right-sided chest pain.  Weakness. Hypertension and diabetes.  CHEST - 2 VIEW  Comparison:  05/22/2011  Findings:  The heart size and mediastinal contours  are within normal limits.  Both lungs are clear.  The visualized skeletal structures are unremarkable.  IMPRESSION: No active cardiopulmonary disease.  Original Report Authenticated By: Danae Orleans, M.D.    Discharge Medications:  Current Discharge Medication List    CONTINUE these medications which have NOT CHANGED   Details  acetaminophen (TYLENOL) 500 MG tablet Take 1,000 mg by mouth every 6 (six) hours as needed. FOR PAIN    albuterol (PROAIR HFA) 108 (90 BASE) MCG/ACT inhaler Inhale 2 puffs into the lungs every 4 (four) hours as needed. SHORTNESS OF BREATH    alendronate (FOSAMAX) 70 MG tablet Take 70 mg by mouth every 7 (seven) days. Take with a full glass of water on an empty stomach. TAKES ON SUNDAYS    amLODipine (NORVASC) 2.5 MG tablet Take 2.5 mg by mouth daily.      ARTIFICIAL TEARS ophthalmic solution Place 1 drop into both eyes 3 (three) times daily as needed. 1 drop both eyes two times a day as needed FOR DRY EYES    aspirin 81 MG EC tablet Take 81 mg by mouth daily.      Calcium Carbonate-Vitamin D (CALTRATE 600+D) 600-400 MG-UNIT per chew tablet Chew 1 tablet by mouth 2 (two) times daily.      fish oil-omega-3 fatty acids 1000 MG capsule Take 2 g by mouth daily.      gabapentin (NEURONTIN) 300 MG capsule Take 600 mg by mouth 2 (two) times daily.     glipiZIDE (GLUCOTROL) 10 MG tablet Take 10 mg by mouth daily.      hydrochlorothiazide 25 MG tablet Take 25 mg by mouth daily.      Multiple Vitamins-Minerals (CENTRUM SILVER PO) Take by mouth daily.      nitroGLYCERIN (NITROSTAT) 0.4 MG SL tablet Place 0.4 mg under the tongue every 5 (five) minutes as needed. FOR CHEST PAIN    rosuvastatin (CRESTOR) 20 MG tablet Take 20 mg by mouth daily.      traMADol (ULTRAM) 50 MG tablet Take 50 mg by mouth every 6 (six) hours as needed. Maximum dose= 8 tablets per day, FOR PAIN      STOP taking these medications     sulfamethoxazole-trimethoprim (BACTRIM DS,SEPTRA DS)  800-160 MG per tablet       We will hold off on BB given her borderline SBP of 100 today.  Disposition:  The patient will be discharged in stable condition to home. Discharge Orders    Future Appointments: Provider: Department: Dept Phone: Center:   06/11/2011 1:10 PM [facility] [phone] [facility]   06/25/2011 11:45 AM Pricilla Riffle, MD [facility] [phone] [facility]     Future Orders Please Complete By  Expires   Diet - low sodium heart healthy      Increase activity slowly        Follow-up Information    Follow up with [facility]. (Our office will call you to set up stress test and follow-up appointment with Dr. Tenny Craw.)    Contact information:   [address] [place] [address]          Duration of Discharge Encounter: Greater than 30 minutes including physician and PA time.  Signed, Ronie Spies PA-C 06/08/2011, 5:52 PM

## 2011-06-08 NOTE — Progress Notes (Signed)
Pt went home per md order. Pt's vital signs stable. Pt denied any chest pain.

## 2011-06-11 ENCOUNTER — Ambulatory Visit
Admission: RE | Admit: 2011-06-11 | Discharge: 2011-06-11 | Disposition: A | Discharge status: Home / Self Care | Payer: Medicare Other | Source: Ambulatory Visit | Attending: Family Medicine | Admitting: Family Medicine

## 2011-06-11 ENCOUNTER — Other Ambulatory Visit: Payer: Self-pay | Admitting: Family Medicine

## 2011-06-11 DIAGNOSIS — N644 Mastodynia: Secondary | ICD-10-CM

## 2011-06-12 ENCOUNTER — Telehealth: Payer: Self-pay | Admitting: Internal Medicine

## 2011-06-12 NOTE — Telephone Encounter (Signed)
LMOM for call back. 

## 2011-06-12 NOTE — Telephone Encounter (Signed)
FU Call: Pt returning call to our office regarding setting up a stress test. Please return pt call to discuss further.

## 2011-06-21 NOTE — Telephone Encounter (Signed)
Called patient again. She is set up for event monitor for tomorrow and to see Dr.Ross back in a month to review results.

## 2011-06-22 ENCOUNTER — Encounter (INDEPENDENT_AMBULATORY_CARE_PROVIDER_SITE_OTHER): Payer: Medicare Other

## 2011-06-22 DIAGNOSIS — I2510 Atherosclerotic heart disease of native coronary artery without angina pectoris: Secondary | ICD-10-CM

## 2011-06-22 DIAGNOSIS — R42 Dizziness and giddiness: Secondary | ICD-10-CM

## 2011-06-25 ENCOUNTER — Other Ambulatory Visit: Payer: Self-pay | Admitting: *Deleted

## 2011-06-25 ENCOUNTER — Ambulatory Visit: Payer: Medicare Other | Admitting: Internal Medicine

## 2011-06-25 DIAGNOSIS — I4891 Unspecified atrial fibrillation: Secondary | ICD-10-CM | POA: Insufficient documentation

## 2011-06-25 MED ORDER — WARFARIN SODIUM 5 MG PO TABS: 5.0000 mg | ORAL_TABLET | Freq: Every day | ORAL | Status: DC

## 2011-06-28 ENCOUNTER — Ambulatory Visit (INDEPENDENT_AMBULATORY_CARE_PROVIDER_SITE_OTHER): Payer: Medicare Other | Admitting: *Deleted

## 2011-06-28 ENCOUNTER — Ambulatory Visit: Payer: Medicare Other | Admitting: *Deleted

## 2011-06-28 DIAGNOSIS — Z7901 Long term (current) use of anticoagulants: Secondary | ICD-10-CM

## 2011-06-28 DIAGNOSIS — I4891 Unspecified atrial fibrillation: Secondary | ICD-10-CM

## 2011-06-28 LAB — POCT INR: INR: 1

## 2011-06-28 NOTE — Patient Instructions (Addendum)
A full discussion of the nature of anticoagulants has been carried out.  A benefit risk analysis has been presented to the patient, so that they understand the justification for choosing anticoagulation at this time. The need for frequent and regular monitoring, precise dosage adjustment and compliance is stressed.  Side effects of potential bleeding are discussed.  The patient should avoid any OTC items containing aspirin or ibuprofen, and should avoid great swings in general diet.  Avoid alcohol consumption.  Call if any signs of abnormal bleeding.   Provided patient list list of Vitamin K foods and the coumadin booklet. Patient is on antibiotic but unsure of name, started on Monday 12/3 for UTI. Have ask patient to call clinic back with name of medication.

## 2011-07-05 ENCOUNTER — Ambulatory Visit (INDEPENDENT_AMBULATORY_CARE_PROVIDER_SITE_OTHER): Payer: Medicare Other | Admitting: *Deleted

## 2011-07-05 DIAGNOSIS — Z7901 Long term (current) use of anticoagulants: Secondary | ICD-10-CM

## 2011-07-05 DIAGNOSIS — I4891 Unspecified atrial fibrillation: Secondary | ICD-10-CM

## 2011-07-05 LAB — POCT INR: INR: 1.2

## 2011-07-05 NOTE — Patient Instructions (Signed)
Reviewed leafy green vegetable intake. 

## 2011-07-12 ENCOUNTER — Ambulatory Visit (INDEPENDENT_AMBULATORY_CARE_PROVIDER_SITE_OTHER): Payer: Medicare Other | Admitting: *Deleted

## 2011-07-12 DIAGNOSIS — Z7901 Long term (current) use of anticoagulants: Secondary | ICD-10-CM

## 2011-07-12 DIAGNOSIS — I4891 Unspecified atrial fibrillation: Secondary | ICD-10-CM

## 2011-07-12 LAB — POCT INR: INR: 1.5

## 2011-07-19 ENCOUNTER — Ambulatory Visit (INDEPENDENT_AMBULATORY_CARE_PROVIDER_SITE_OTHER): Payer: Medicare Other | Admitting: *Deleted

## 2011-07-19 ENCOUNTER — Encounter: Payer: Self-pay | Admitting: Internal Medicine

## 2011-07-19 ENCOUNTER — Ambulatory Visit (INDEPENDENT_AMBULATORY_CARE_PROVIDER_SITE_OTHER): Payer: Medicare Other | Admitting: Internal Medicine

## 2011-07-19 DIAGNOSIS — I4891 Unspecified atrial fibrillation: Secondary | ICD-10-CM

## 2011-07-19 DIAGNOSIS — Z7901 Long term (current) use of anticoagulants: Secondary | ICD-10-CM

## 2011-07-19 DIAGNOSIS — I10 Essential (primary) hypertension: Secondary | ICD-10-CM

## 2011-07-19 DIAGNOSIS — I2510 Atherosclerotic heart disease of native coronary artery without angina pectoris: Secondary | ICD-10-CM

## 2011-07-19 MED ORDER — WARFARIN SODIUM 5 MG PO TABS: ORAL_TABLET | ORAL | Status: DC

## 2011-07-19 NOTE — Progress Notes (Signed)
HPI  Patient is an 77 year old with a history of CAD.  Last cath in Feb 2010 showed 40% LAD.  Patient stent to LAD; 30% RCA. The patient was admitted for CP in NOvember.  Ruled out for MI.  Set up for outpatient [facility] which she has not had  Prior to admit she had been seen by Wende Mott.  Set up for event monitor.  Since she has gotten it she has had multiple episodes of atrial fibrillation.  She has not however (that I can tell) activated device when she is feeling bad.  They instead represent autotriggers. Last night seh felt bad.  She did not activate device.  Allergies  Allergen Reactions  . Amoxicillin Other (See Comments)    UNKNOWN    Current Outpatient Prescriptions  Medication Sig Dispense Refill  . acetaminophen (TYLENOL) 500 MG tablet Take 1,000 mg by mouth every 6 (six) hours as needed. FOR PAIN      . albuterol (PROAIR HFA) 108 (90 BASE) MCG/ACT inhaler Inhale 2 puffs into the lungs every 4 (four) hours as needed. SHORTNESS OF BREATH      . alendronate (FOSAMAX) 70 MG tablet Take 70 mg by mouth every 7 (seven) days. Take with a full glass of water on an empty stomach. TAKES ON SUNDAYS      . amLODipine (NORVASC) 2.5 MG tablet Take 2.5 mg by mouth daily.        . ARTIFICIAL TEARS ophthalmic solution Place 1 drop into both eyes 3 (three) times daily as needed. 1 drop both eyes two times a day as needed FOR DRY EYES      . aspirin 81 MG EC tablet Take 81 mg by mouth daily.        . Calcium Carbonate-Vitamin D (CALTRATE 600+D) 600-400 MG-UNIT per chew tablet Chew 1 tablet by mouth 2 (two) times daily.        . fish oil-omega-3 fatty acids 1000 MG capsule Take 2 g by mouth daily.        Marland Kitchen gabapentin (NEURONTIN) 300 MG capsule Take 600 mg by mouth 2 (two) times daily.       Marland Kitchen glipiZIDE (GLUCOTROL) 10 MG tablet Take 10 mg by mouth daily.        . hydrochlorothiazide 25 MG tablet Take 25 mg by mouth daily.        . Multiple Vitamins-Minerals (CENTRUM SILVER PO) Take by mouth daily.         . rosuvastatin (CRESTOR) 20 MG tablet Take 20 mg by mouth daily.        . traMADol (ULTRAM) 50 MG tablet Take 50 mg by mouth every 6 (six) hours as needed. Maximum dose= 8 tablets per day, FOR PAIN      . warfarin (COUMADIN) 5 MG tablet Take as directed by the Anticoagulation clinic  50 tablet  3  . nitroGLYCERIN (NITROSTAT) 0.4 MG SL tablet Place 0.4 mg under the tongue every 5 (five) minutes as needed. FOR CHEST PAIN        Past Medical History  Diagnosis Date  . Allergic rhinitis   . Anemia     NOS chronic disease. Hgb 11.6gm% 04/14/2009  . CAD (coronary artery disease)   . Diabetes mellitus type II   . Diverticulitis of colon   . GERD (gastroesophageal reflux disease)   . Hyperlipidemia   . HTN (hypertension)   . DM neuropathy, type II diabetes mellitus   . IBS (irritable bowel syndrome)   .  Spinal stenosis     djd lumbar  . Multinodular goiter   . Orthostatic hypotension   . Obesity     BMI-37  . Acute renal failure     due to dehydration-Sept 2009; Normal creat 1.2 on fu 05/13/2009  . Hx of colonoscopy   . Angina   . Cancer     on nose and had it removed  . DEMENTIA     slight  . Osteoarthritis     lower back  . Dysrhythmia   . Pneumonia     Past Surgical History  Procedure Date  . Cholecystectomy   . Abdominal hysterectomy   . Nasal sinus surgery     x2  . Bilateral arthroscopic knee surgery   . Stent surgery   . Coronary angioplasty     stent  . Carpel tunnel wrist rt     Family History  Problem Relation Age of Onset  . Breast cancer Sister   . Heart disease Brother   . Colon cancer Neg Hx     History   Social History  . Marital Status: Married    Spouse Name: N/A    Number of Children: N/A  . Years of Education: N/A   Occupational History  . retired    Social History Main Topics  . Smoking status: Never Smoker   . Smokeless tobacco: Never Used  . Alcohol Use: No  . Drug Use: No  . Sexually Active: No   Other Topics Concern  .  Not on file   Social History Narrative  . No narrative on file    Review of Systems:  All systems reviewed.  They are negative to the above problem except as previously stated.  Vital Signs: BP 130/68  Pulse 82  Ht 5\' 2"  (1.575 m)  Wt 190 lb (86.183 kg)  BMI 34.75 kg/m2  Physical Exam Patient is in NAD  HEENT:  Normocephalic, atraumatic. EOMI, PERRLA.  Neck: JVP is normal. No thyromegaly. No bruits.  Lungs: clear to auscultation. No rales no wheezes.  Heart: Regular rate and rhythm. Normal S1, S2. No S3.   No significant murmurs. PMI not displaced.  Abdomen:  Supple, nontender. Normal bowel sounds. No masses. No hepatomegaly.  Extremities:   Good distal pulses throughout. No lower extremity edema.  Musculoskeletal :moving all extremities.  Neuro:   alert and oriented x3.  CN II-XII grossly intact.   Assessment and Plan:

## 2011-07-20 NOTE — Assessment & Plan Note (Signed)
I am not convinced of any acitve symptoms.

## 2011-07-20 NOTE — Progress Notes (Signed)
Patient ID: [name], female   DOB: [birth year], 77 y.o.   MRN: [id number]

## 2011-07-20 NOTE — Assessment & Plan Note (Signed)
Patient's rates are overall not that bad.  I am not sure her symptoms of feeling bad are associated with afib.  I have asked her to use the monitor over the next few.  If truly assoc with symptoms I would consider antiarrhythmic. Continue coumadin.

## 2011-07-20 NOTE — Assessment & Plan Note (Signed)
Adequate control. 

## 2011-07-27 ENCOUNTER — Ambulatory Visit (INDEPENDENT_AMBULATORY_CARE_PROVIDER_SITE_OTHER): Payer: Medicare Other | Admitting: *Deleted

## 2011-07-27 DIAGNOSIS — I4891 Unspecified atrial fibrillation: Secondary | ICD-10-CM

## 2011-07-27 DIAGNOSIS — Z7901 Long term (current) use of anticoagulants: Secondary | ICD-10-CM

## 2011-07-27 LAB — POCT INR: INR: 1.7

## 2011-08-02 ENCOUNTER — Telehealth: Payer: Self-pay | Admitting: *Deleted

## 2011-08-02 NOTE — Telephone Encounter (Signed)
Called patient to advise her that Dr.Ross reviewed her event monitor from 11/30 to 07/21/2011. RSR with intermittent Afib with good rate control. No medication changes per Dr.Ross. Patient verbalized understanding.

## 2011-08-03 ENCOUNTER — Telehealth: Payer: Self-pay | Admitting: Internal Medicine

## 2011-08-03 NOTE — Telephone Encounter (Signed)
New msg Pt said she forgot to ask you some questions last night when she spoke with you please call

## 2011-08-03 NOTE — Telephone Encounter (Signed)
Called patient concerning heart rate control. She will let us know if her heart rate stays up over 100 at rest.

## 2011-08-07 ENCOUNTER — Ambulatory Visit (INDEPENDENT_AMBULATORY_CARE_PROVIDER_SITE_OTHER): Payer: Medicare Other | Admitting: Nurse Practitioner

## 2011-08-07 ENCOUNTER — Telehealth: Payer: Self-pay | Admitting: Internal Medicine

## 2011-08-07 ENCOUNTER — Ambulatory Visit (INDEPENDENT_AMBULATORY_CARE_PROVIDER_SITE_OTHER): Payer: Medicare Other | Admitting: *Deleted

## 2011-08-07 ENCOUNTER — Encounter: Payer: Self-pay | Admitting: Nurse Practitioner

## 2011-08-07 VITALS — BP 102/68 | HR 97 | Ht 62.0 in | Wt 192.0 lb

## 2011-08-07 DIAGNOSIS — I2510 Atherosclerotic heart disease of native coronary artery without angina pectoris: Secondary | ICD-10-CM

## 2011-08-07 DIAGNOSIS — Z7901 Long term (current) use of anticoagulants: Secondary | ICD-10-CM

## 2011-08-07 DIAGNOSIS — R000 Tachycardia, unspecified: Secondary | ICD-10-CM

## 2011-08-07 DIAGNOSIS — I4891 Unspecified atrial fibrillation: Secondary | ICD-10-CM

## 2011-08-07 NOTE — Assessment & Plan Note (Signed)
She has had PAF. Today she is in sinus.

## 2011-08-07 NOTE — Telephone Encounter (Signed)
New msg Pt wanted you to know her heart rate is 119 today. She was told to call if Heart rate was high. Please call her back

## 2011-08-07 NOTE — Patient Instructions (Signed)
I would like for you to stay on your current medicines. Let us know if you feel bad and note that your heart rate is elevated.   Please see Dr. Tenny Craw in 4 months.  Call the [facility] office at [phone] if you have any questions, problems or concerns.

## 2011-08-07 NOTE — Telephone Encounter (Signed)
Called patient concerning her heart rate. She felt her heart racing today and her monitor showed her BP to be 142/73 with a HR of 119. She is on no medications for rate control. Advised to come to office to see Dawayne Patricia NP at 2 pm today.

## 2011-08-07 NOTE — Assessment & Plan Note (Signed)
I think she is ok. I do not think she needs additional medicines at this time. She is totally asymptomatic. She would not have known that her heart rate was up if she had not checked her blood pressure. I think further medicines may actually feel bad. She does have a long first degree AV block. She is already protected with her coumadin therapy. We will check her INR today. I think her pain is a big trigger for the tachycardia. She is trying to get in with ortho. We will see her back in 4 months. If she has recurrence with symptoms, then we will need a different plan of care.  Patient is agreeable to this plan and will call if any problems develop in the interim.

## 2011-08-07 NOTE — Progress Notes (Signed)
[name] Date of Birth: September 18, 1923 Medical Record #[id number]  History of Present Illness: [name] is seen today for a work in visit. She is seen for Dr. Tenny Craw. She is here with her granddaughter. She has a known history of CAD with prior cath in 2010. She has had PAF noted on an event monitor. Her spells did not seem to correlate however with the episodes of atrial fib. She remains on her coumadin.   She comes in today. She is here because she just happened to check her blood pressure and noted a heart rate of 119. She called and was told to come in. She did not feel bad. She did not feel like her heart rate was fast. No hard beating. She was not lightheaded or dizzy. Doesn't feel like she may pass out. No chest pain. She has been having more knee pain and was hurting considerably when she checked her blood pressure. She does get anxious.   Current Outpatient Prescriptions on File Prior to Visit  Medication Sig Dispense Refill  . acetaminophen (TYLENOL) 500 MG tablet Take 1,000 mg by mouth every 6 (six) hours as needed. FOR PAIN      . albuterol (PROAIR HFA) 108 (90 BASE) MCG/ACT inhaler Inhale 2 puffs into the lungs every 4 (four) hours as needed. SHORTNESS OF BREATH      . alendronate (FOSAMAX) 70 MG tablet Take 70 mg by mouth every 7 (seven) days. Take with a full glass of water on an empty stomach. TAKES ON SUNDAYS      . amLODipine (NORVASC) 2.5 MG tablet Take 2.5 mg by mouth daily.        . ARTIFICIAL TEARS ophthalmic solution Place 1 drop into both eyes 3 (three) times daily as needed. 1 drop both eyes two times a day as needed FOR DRY EYES      . aspirin 81 MG EC tablet Take 81 mg by mouth daily.        . Calcium Carbonate-Vitamin D (CALTRATE 600+D) 600-400 MG-UNIT per chew tablet Chew 1 tablet by mouth 2 (two) times daily.        . fish oil-omega-3 fatty acids 1000 MG capsule Take 2 g by mouth daily.        Marland Kitchen gabapentin (NEURONTIN) 300 MG capsule Take 600 mg by mouth 2  (two) times daily.       Marland Kitchen glipiZIDE (GLUCOTROL) 10 MG tablet Take 10 mg by mouth daily.        . hydrochlorothiazide 25 MG tablet Take 25 mg by mouth daily.        . Multiple Vitamins-Minerals (CENTRUM SILVER PO) Take by mouth daily.        . nitroGLYCERIN (NITROSTAT) 0.4 MG SL tablet Place 0.4 mg under the tongue every 5 (five) minutes as needed. FOR CHEST PAIN      . rosuvastatin (CRESTOR) 20 MG tablet Take 20 mg by mouth daily.        . traMADol (ULTRAM) 50 MG tablet Take 50 mg by mouth every 6 (six) hours as needed. Maximum dose= 8 tablets per day, FOR PAIN      . warfarin (COUMADIN) 5 MG tablet Take as directed by the Anticoagulation clinic  50 tablet  3    Allergies  Allergen Reactions  . Amoxicillin Other (See Comments)    UNKNOWN    Past Medical History  Diagnosis Date  . Allergic rhinitis   . Anemia     NOS chronic  disease. Hgb 11.6gm% 04/14/2009  . CAD (coronary artery disease)   . Diabetes mellitus type II   . Diverticulitis of colon   . GERD (gastroesophageal reflux disease)   . Hyperlipidemia   . HTN (hypertension)   . DM neuropathy, type II diabetes mellitus   . IBS (irritable bowel syndrome)   . Spinal stenosis     djd lumbar  . Multinodular goiter   . Orthostatic hypotension   . Obesity     BMI-37  . Acute renal failure     due to dehydration-Sept 2009; Normal creat 1.2 on fu 05/13/2009  . Hx of colonoscopy   . Angina   . Cancer     on nose and had it removed  . DEMENTIA     slight  . Osteoarthritis     lower back  . Dysrhythmia   . Pneumonia   . PAF (paroxysmal atrial fibrillation)     noted on event monitor. No correlation with episodes and symptoms  . Chronic anticoagulation     Past Surgical History  Procedure Date  . Cholecystectomy   . Abdominal hysterectomy   . Nasal sinus surgery     x2  . Bilateral arthroscopic knee surgery   . Stent surgery   . Coronary angioplasty     stent  . Carpel tunnel wrist rt     History  Smoking  status  . Never Smoker   Smokeless tobacco  . Never Used    History  Alcohol Use No    Family History  Problem Relation Age of Onset  . Breast cancer Sister   . Heart disease Brother   . Colon cancer Neg Hx     Review of Systems: The review of systems is positive for knee pain, especially on the right. No cardiac complaints. Her blood pressure has been doing fine at home.  All other systems were reviewed and are negative.  Physical Exam: BP 102/68  Pulse 97  Ht 5\' 2"  (1.575 m)  Wt 192 lb (87.091 kg)  BMI 35.12 kg/m2 Patient is very pleasant and in no acute distress. Skin is warm and dry. Color is normal.  HEENT is unremarkable. Normocephalic/atraumatic. PERRL. Sclera are nonicteric. Neck is supple. No masses. No JVD. Lungs are clear. Cardiac exam shows a regular rate and rhythm. Her rate is a little fast but not irregular. Abdomen is obese but soft. Extremities are without edema. Gait and ROM are intact. No gross neurologic deficits noted.   LABORATORY DATA: Her EKG today shows sinus with RBBB. Rate is 97. She does have a long first degree AV block.   Assessment / Plan:

## 2011-08-07 NOTE — Assessment & Plan Note (Signed)
No chest pain reported 

## 2011-08-10 ENCOUNTER — Encounter: Payer: Medicare Other | Admitting: *Deleted

## 2011-08-20 ENCOUNTER — Telehealth: Payer: Self-pay | Admitting: Internal Medicine

## 2011-08-20 NOTE — Telephone Encounter (Signed)
New Msg: pt calling wanting to inform Annice Pih of pt HR this AM. Please return pt call to discuss further.   HR 119 (this am)

## 2011-08-20 NOTE — Telephone Encounter (Signed)
Spoke with patient   Has URI with coughing  Is going back to primary MD Does notice heart rate is faster at timess.  No dizzy. Review of monitor I would recomm conitnuing medicines.  When in SR she is going slower with a first degree block  Could make her more symptomatic if try to slow. She denies dizziness.

## 2011-08-21 ENCOUNTER — Encounter: Payer: Medicare Other | Admitting: *Deleted

## 2011-08-22 ENCOUNTER — Ambulatory Visit (INDEPENDENT_AMBULATORY_CARE_PROVIDER_SITE_OTHER): Payer: Medicare Other | Admitting: *Deleted

## 2011-08-22 DIAGNOSIS — I4891 Unspecified atrial fibrillation: Secondary | ICD-10-CM

## 2011-08-22 DIAGNOSIS — Z7901 Long term (current) use of anticoagulants: Secondary | ICD-10-CM

## 2011-09-05 ENCOUNTER — Ambulatory Visit (INDEPENDENT_AMBULATORY_CARE_PROVIDER_SITE_OTHER): Payer: Medicare Other | Admitting: *Deleted

## 2011-09-05 DIAGNOSIS — I4891 Unspecified atrial fibrillation: Secondary | ICD-10-CM

## 2011-09-05 DIAGNOSIS — Z7901 Long term (current) use of anticoagulants: Secondary | ICD-10-CM

## 2011-09-18 ENCOUNTER — Telehealth: Payer: Self-pay | Admitting: Internal Medicine

## 2011-09-18 NOTE — Telephone Encounter (Signed)
New msg Patient is scheduled for MRI. She said they asked her what kind of stent she had put in. Please call

## 2011-09-18 NOTE — Telephone Encounter (Signed)
09/18/11--advised pt she has a taxus des--pt agrees--nt

## 2011-09-24 ENCOUNTER — Telehealth: Payer: Self-pay | Admitting: *Deleted
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
## 2011-09-26 ENCOUNTER — Ambulatory Visit (INDEPENDENT_AMBULATORY_CARE_PROVIDER_SITE_OTHER): Payer: Medicare Other | Admitting: *Deleted

## 2011-09-26 DIAGNOSIS — I4891 Unspecified atrial fibrillation: Secondary | ICD-10-CM

## 2011-09-26 DIAGNOSIS — Z7901 Long term (current) use of anticoagulants: Secondary | ICD-10-CM

## 2011-09-26 LAB — POCT INR: INR: 1.4

## 2011-10-09 ENCOUNTER — Telehealth: Payer: Self-pay | Admitting: Internal Medicine

## 2011-10-09 NOTE — Telephone Encounter (Signed)
New msg Pt wanted to talk to nurse about her BP 106/71 pr 130. Please call her back

## 2011-10-09 NOTE — Telephone Encounter (Signed)
Pt was notified and is agreeable.

## 2011-10-09 NOTE — Telephone Encounter (Signed)
[name] is calling to report her bp and pulse.  Pulse was up to 130 this am.  She states she had two dizzy spells yesterday while looking up but no dizzy spells today.  She thinks her pulse is a little irregular but is not beating hard.  She denies sob or pain, (except the usual leg pain).  No other symptoms.

## 2011-10-09 NOTE — Telephone Encounter (Signed)
Remains on coumadin anticoagulation. Very similar to her last episode. Would monitor. Can see later this week if needed. Call for any worsening of symptoms.

## 2011-10-10 ENCOUNTER — Ambulatory Visit (INDEPENDENT_AMBULATORY_CARE_PROVIDER_SITE_OTHER): Payer: Medicare Other | Admitting: *Deleted

## 2011-10-10 DIAGNOSIS — Z7901 Long term (current) use of anticoagulants: Secondary | ICD-10-CM

## 2011-10-10 DIAGNOSIS — I4891 Unspecified atrial fibrillation: Secondary | ICD-10-CM

## 2011-10-24 ENCOUNTER — Ambulatory Visit (INDEPENDENT_AMBULATORY_CARE_PROVIDER_SITE_OTHER): Payer: Medicare Other | Admitting: *Deleted

## 2011-10-24 DIAGNOSIS — I4891 Unspecified atrial fibrillation: Secondary | ICD-10-CM

## 2011-10-24 DIAGNOSIS — Z7901 Long term (current) use of anticoagulants: Secondary | ICD-10-CM

## 2011-11-19 ENCOUNTER — Encounter (HOSPITAL_COMMUNITY): Payer: Self-pay | Admitting: Emergency Medicine

## 2011-11-19 ENCOUNTER — Emergency Department (HOSPITAL_COMMUNITY): Payer: Medicare Other

## 2011-11-19 ENCOUNTER — Observation Stay (HOSPITAL_COMMUNITY)
Admission: EM | Admit: 2011-11-19 | Discharge: 2011-11-22 | Disposition: A | Discharge status: Home / Self Care | Payer: Medicare Other | Attending: Internal Medicine | Admitting: Internal Medicine

## 2011-11-19 DIAGNOSIS — Z7901 Long term (current) use of anticoagulants: Secondary | ICD-10-CM | POA: Insufficient documentation

## 2011-11-19 DIAGNOSIS — E1149 Type 2 diabetes mellitus with other diabetic neurological complication: Secondary | ICD-10-CM | POA: Insufficient documentation

## 2011-11-19 DIAGNOSIS — F0390 Unspecified dementia without behavioral disturbance: Secondary | ICD-10-CM | POA: Insufficient documentation

## 2011-11-19 DIAGNOSIS — R0602 Shortness of breath: Secondary | ICD-10-CM | POA: Insufficient documentation

## 2011-11-19 DIAGNOSIS — K219 Gastro-esophageal reflux disease without esophagitis: Secondary | ICD-10-CM | POA: Insufficient documentation

## 2011-11-19 DIAGNOSIS — R109 Unspecified abdominal pain: Secondary | ICD-10-CM | POA: Insufficient documentation

## 2011-11-19 DIAGNOSIS — E119 Type 2 diabetes mellitus without complications: Secondary | ICD-10-CM | POA: Diagnosis present

## 2011-11-19 DIAGNOSIS — E1142 Type 2 diabetes mellitus with diabetic polyneuropathy: Secondary | ICD-10-CM | POA: Insufficient documentation

## 2011-11-19 DIAGNOSIS — I4891 Unspecified atrial fibrillation: Secondary | ICD-10-CM | POA: Diagnosis present

## 2011-11-19 DIAGNOSIS — R079 Chest pain, unspecified: Principal | ICD-10-CM | POA: Diagnosis present

## 2011-11-19 DIAGNOSIS — I2510 Atherosclerotic heart disease of native coronary artery without angina pectoris: Secondary | ICD-10-CM | POA: Diagnosis present

## 2011-11-19 DIAGNOSIS — I10 Essential (primary) hypertension: Secondary | ICD-10-CM | POA: Insufficient documentation

## 2011-11-19 DIAGNOSIS — R110 Nausea: Secondary | ICD-10-CM | POA: Insufficient documentation

## 2011-11-19 LAB — CBC
MCH: 31.5 pg (ref 26.0–34.0)
MCHC: 35 g/dL (ref 30.0–36.0)
MCV: 89.9 fL (ref 78.0–100.0)
Platelets: 195 10*3/uL (ref 150–400)
RBC: 3.97 MIL/uL (ref 3.87–5.11)
RDW: 13.4 % (ref 11.5–15.5)

## 2011-11-19 LAB — COMPREHENSIVE METABOLIC PANEL
ALT: 24 U/L (ref 0–35)
AST: 34 U/L (ref 0–37)
CO2: 26 mEq/L (ref 19–32)
Calcium: 8.5 mg/dL (ref 8.4–10.5)
Creatinine, Ser: 0.9 mg/dL (ref 0.50–1.10)
GFR calc non Af Amer: 57 mL/min — ABNORMAL LOW (ref >90)
Sodium: 136 mEq/L (ref 135–145)
Total Protein: 6 g/dL (ref 6.0–8.3)

## 2011-11-19 LAB — POCT I-STAT TROPONIN I: Troponin i, poc: 0.01 ng/mL (ref 0.00–0.08)

## 2011-11-19 LAB — PROTIME-INR
INR: 3.68 — ABNORMAL HIGH (ref 0.00–1.49)
Prothrombin Time: 37.1 seconds — ABNORMAL HIGH (ref 11.6–15.2)

## 2011-11-19 LAB — POCT I-STAT, CHEM 8
Calcium, Ion: 1.11 mmol/L — ABNORMAL LOW (ref 1.12–1.32)
Chloride: 102 mEq/L (ref 96–112)
Glucose, Bld: 133 mg/dL — ABNORMAL HIGH (ref 70–99)
HCT: 36 % (ref 36.0–46.0)
Hemoglobin: 12.2 g/dL (ref 12.0–15.0)
Potassium: 3.3 mEq/L — ABNORMAL LOW (ref 3.5–5.1)

## 2011-11-19 LAB — LIPASE, BLOOD: Lipase: 96 U/L — ABNORMAL HIGH (ref 11–59)

## 2011-11-19 MED ORDER — POTASSIUM CHLORIDE CRYS ER 20 MEQ PO TBCR
20.0000 meq | EXTENDED_RELEASE_TABLET | Freq: Once | ORAL | Status: AC
  Administered 2011-11-20: 20 meq via ORAL
  Filled 2011-11-19: qty 1

## 2011-11-19 MED ORDER — ONDANSETRON HCL 4 MG/2ML IJ SOLN
INTRAMUSCULAR | Status: AC
  Filled 2011-11-19: qty 2

## 2011-11-19 NOTE — ED Notes (Signed)
EKG done by [facility], NT.

## 2011-11-19 NOTE — ED Notes (Addendum)
Pt c/o pain that radated from Mid sternal area to epigastric area onset at [facility] describes as dull ache.

## 2011-11-19 NOTE — ED Notes (Signed)
Pt received 4 baby ASA 2 nitro subling, pain decreased from 8 to 4/10 also received zofran 4mg  and tums

## 2011-11-20 ENCOUNTER — Observation Stay (HOSPITAL_COMMUNITY): Payer: Medicare Other

## 2011-11-20 ENCOUNTER — Encounter (HOSPITAL_COMMUNITY): Payer: Self-pay | Admitting: Internal Medicine

## 2011-11-20 DIAGNOSIS — R079 Chest pain, unspecified: Secondary | ICD-10-CM | POA: Diagnosis present

## 2011-11-20 LAB — GLUCOSE, CAPILLARY: Glucose-Capillary: 86 mg/dL (ref 70–99)

## 2011-11-20 LAB — CARDIAC PANEL(CRET KIN+CKTOT+MB+TROPI)
CK, MB: 2.5 ng/mL (ref 0.3–4.0)
CK, MB: 3.8 ng/mL (ref 0.3–4.0)
Relative Index: 2.2 (ref 0.0–2.5)
Relative Index: 2 (ref 0.0–2.5)
Total CK: 112 U/L (ref 7–177)
Troponin I: <0.3 ng/mL (ref <0.30)
Troponin I: <0.3 ng/mL (ref <0.30)

## 2011-11-20 LAB — COMPREHENSIVE METABOLIC PANEL
AST: 35 U/L (ref 0–37)
Albumin: 3.2 g/dL — ABNORMAL LOW (ref 3.5–5.2)
Alkaline Phosphatase: 41 U/L (ref 39–117)
BUN: 13 mg/dL (ref 6–23)
Potassium: 3.7 mEq/L (ref 3.5–5.1)
Sodium: 138 mEq/L (ref 135–145)
Total Protein: 6 g/dL (ref 6.0–8.3)

## 2011-11-20 LAB — CBC
HCT: 35.7 % — ABNORMAL LOW (ref 36.0–46.0)
MCH: 31.7 pg (ref 26.0–34.0)
MCV: 89.9 fL (ref 78.0–100.0)
RDW: 13.4 % (ref 11.5–15.5)
WBC: 4.9 10*3/uL (ref 4.0–10.5)

## 2011-11-20 LAB — PROTIME-INR: Prothrombin Time: 38.3 seconds — ABNORMAL HIGH (ref 11.6–15.2)

## 2011-11-20 MED ORDER — POLYVINYL ALCOHOL 1.4 % OP SOLN: 1.0000 [drp] | Freq: Three times a day (TID) | OPHTHALMIC | Status: DC | PRN

## 2011-11-20 MED ORDER — ASPIRIN EC 325 MG PO TBEC
325.0000 mg | DELAYED_RELEASE_TABLET | Freq: Every day | ORAL | Status: DC
  Administered 2011-11-20 – 2011-11-21 (×2): 325 mg via ORAL
  Filled 2011-11-20 (×2): qty 1

## 2011-11-20 MED ORDER — ONDANSETRON HCL 4 MG/2ML IJ SOLN: 4.0000 mg | Freq: Four times a day (QID) | INTRAMUSCULAR | Status: DC | PRN

## 2011-11-20 MED ORDER — INSULIN ASPART 100 UNIT/ML ~~LOC~~ SOLN
0.0000 [IU] | Freq: Three times a day (TID) | SUBCUTANEOUS | Status: DC
  Administered 2011-11-20: 1 [IU] via SUBCUTANEOUS
  Administered 2011-11-21: 2 [IU] via SUBCUTANEOUS

## 2011-11-20 MED ORDER — ACETAMINOPHEN 325 MG PO TABS
650.0000 mg | ORAL_TABLET | Freq: Four times a day (QID) | ORAL | Status: DC | PRN
  Administered 2011-11-20 – 2011-11-22 (×2): 650 mg via ORAL
  Filled 2011-11-20 (×2): qty 2

## 2011-11-20 MED ORDER — IOHEXOL 300 MG/ML  SOLN
20.0000 mL | INTRAMUSCULAR | Status: AC
  Administered 2011-11-20 (×2): 20 mL via ORAL

## 2011-11-20 MED ORDER — ONDANSETRON HCL 4 MG PO TABS: 4.0000 mg | ORAL_TABLET | Freq: Four times a day (QID) | ORAL | Status: DC | PRN

## 2011-11-20 MED ORDER — SODIUM CHLORIDE 0.9 % IJ SOLN
3.0000 mL | Freq: Two times a day (BID) | INTRAMUSCULAR | Status: DC
  Administered 2011-11-20 – 2011-11-22 (×4): 3 mL via INTRAVENOUS

## 2011-11-20 MED ORDER — ACETAMINOPHEN 650 MG RE SUPP: 650.0000 mg | Freq: Four times a day (QID) | RECTAL | Status: DC | PRN

## 2011-11-20 MED ORDER — ENSURE COMPLETE PO LIQD
237.0000 mL | Freq: Two times a day (BID) | ORAL | Status: DC
  Administered 2011-11-20 – 2011-11-21 (×2): 237 mL via ORAL

## 2011-11-20 MED ORDER — NITROGLYCERIN 0.4 MG SL SUBL: 0.4000 mg | SUBLINGUAL_TABLET | SUBLINGUAL | Status: DC | PRN

## 2011-11-20 MED ORDER — GABAPENTIN 300 MG PO CAPS
600.0000 mg | ORAL_CAPSULE | Freq: Two times a day (BID) | ORAL | Status: DC
  Administered 2011-11-20 – 2011-11-22 (×5): 600 mg via ORAL
  Filled 2011-11-20 (×7): qty 2

## 2011-11-20 MED ORDER — IOHEXOL 300 MG/ML  SOLN
100.0000 mL | Freq: Once | INTRAMUSCULAR | Status: AC | PRN
  Administered 2011-11-20: 100 mL via INTRAVENOUS

## 2011-11-20 MED ORDER — GLIPIZIDE 10 MG PO TABS
10.0000 mg | ORAL_TABLET | Freq: Every day | ORAL | Status: DC
  Administered 2011-11-20: 10 mg via ORAL
  Filled 2011-11-20 (×2): qty 1

## 2011-11-20 MED ORDER — OMEGA-3-ACID ETHYL ESTERS 1 G PO CAPS
2.0000 g | ORAL_CAPSULE | Freq: Every day | ORAL | Status: DC
  Administered 2011-11-20 – 2011-11-22 (×3): 2 g via ORAL
  Filled 2011-11-20 (×4): qty 2

## 2011-11-20 MED ORDER — WARFARIN - PHARMACIST DOSING INPATIENT: Freq: Every day | Status: DC

## 2011-11-20 MED ORDER — AMLODIPINE BESYLATE 2.5 MG PO TABS
2.5000 mg | ORAL_TABLET | Freq: Every day | ORAL | Status: DC
  Administered 2011-11-20 – 2011-11-22 (×3): 2.5 mg via ORAL
  Filled 2011-11-20 (×3): qty 1

## 2011-11-20 MED ORDER — ALBUTEROL SULFATE HFA 108 (90 BASE) MCG/ACT IN AERS: 2.0000 | INHALATION_SPRAY | RESPIRATORY_TRACT | Status: DC | PRN

## 2011-11-20 MED ORDER — POTASSIUM CHLORIDE IN NACL 20-0.9 MEQ/L-% IV SOLN
INTRAVENOUS | Status: DC
  Administered 2011-11-20 – 2011-11-21 (×3): via INTRAVENOUS
  Filled 2011-11-20 (×7): qty 1000

## 2011-11-20 MED ORDER — TRAMADOL HCL 50 MG PO TABS
50.0000 mg | ORAL_TABLET | Freq: Every day | ORAL | Status: DC
  Administered 2011-11-20 – 2011-11-22 (×3): 50 mg via ORAL
  Filled 2011-11-20 (×3): qty 1

## 2011-11-20 MED ORDER — GI COCKTAIL ~~LOC~~
30.0000 mL | Freq: Once | ORAL | Status: AC
  Administered 2011-11-20: 30 mL via ORAL
  Filled 2011-11-20: qty 30

## 2011-11-20 MED ORDER — GLIPIZIDE ER 10 MG PO TB24
10.0000 mg | ORAL_TABLET | Freq: Every day | ORAL | Status: DC
  Administered 2011-11-21 – 2011-11-22 (×2): 10 mg via ORAL
  Filled 2011-11-20 (×3): qty 1

## 2011-11-20 MED ORDER — VENLAFAXINE HCL 37.5 MG PO TABS
37.5000 mg | ORAL_TABLET | Freq: Every day | ORAL | Status: DC
  Administered 2011-11-20 – 2011-11-22 (×3): 37.5 mg via ORAL
  Filled 2011-11-20 (×3): qty 1

## 2011-11-20 MED ORDER — ATORVASTATIN CALCIUM 40 MG PO TABS
40.0000 mg | ORAL_TABLET | Freq: Every day | ORAL | Status: DC
  Administered 2011-11-20 – 2011-11-21 (×2): 40 mg via ORAL
  Filled 2011-11-20 (×3): qty 1

## 2011-11-20 NOTE — ED Notes (Signed)
Mandesha RN [phone] called with update r/t CT abd / pelvis with 2 hr oral prep will send pt to the floor now

## 2011-11-20 NOTE — ED Notes (Signed)
Patient denies pain and is resting comfortably. Pt to 4700 via strecher on monitor VSS

## 2011-11-20 NOTE — ED Provider Notes (Signed)
History     CSN: [id number]  Arrival date & time 11/19/11  2212   First MD Initiated Contact with Patient 11/19/11 2302      Chief Complaint  Patient presents with  . Chest Pain  . Abdominal Pain    (Consider location/radiation/quality/duration/timing/severity/associated sxs/prior treatment) HPI History provided by the patient. Onset of substernal chest pressure and epigastric pain around 6 PM. Took TUMS with no relief and presents here with symptoms resolving. Some shortness of breath no diaphoresis. Some nausea. Patient has cardiac history denies history of MI. She has had symptoms like this before but denies any history of reflux. Moderate in severity. No known aggravating or alleviating factors. Onset at rest. No back pain. No radiation. She took aspirin prior to arrival. Past Medical History  Diagnosis Date  . Allergic rhinitis   . Anemia     NOS chronic disease. Hgb 11.6gm% 04/14/2009  . CAD (coronary artery disease)   . Diabetes mellitus type II   . Diverticulitis of colon   . GERD (gastroesophageal reflux disease)   . Hyperlipidemia   . HTN (hypertension)   . DM neuropathy, type II diabetes mellitus   . IBS (irritable bowel syndrome)   . Spinal stenosis     djd lumbar  . Multinodular goiter   . Orthostatic hypotension   . Obesity     BMI-37  . Acute renal failure     due to dehydration-Sept 2009; Normal creat 1.2 on fu 05/13/2009  . Hx of colonoscopy   . Angina   . Cancer     on nose and had it removed  . DEMENTIA     slight  . Osteoarthritis     lower back  . Dysrhythmia   . Pneumonia   . PAF (paroxysmal atrial fibrillation)     noted on event monitor. No correlation with episodes and symptoms  . Chronic anticoagulation     Past Surgical History  Procedure Date  . Cholecystectomy   . Abdominal hysterectomy   . Nasal sinus surgery     x2  . Bilateral arthroscopic knee surgery   . Stent surgery   . Coronary angioplasty     stent  . Carpel tunnel  wrist rt     Family History  Problem Relation Age of Onset  . Breast cancer Sister   . Heart disease Brother   . Colon cancer Neg Hx     History  Substance Use Topics  . Smoking status: Never Smoker   . Smokeless tobacco: Never Used  . Alcohol Use: No    OB History    Grav Para Term Preterm Abortions TAB SAB Ect Mult Living                  Review of Systems  Constitutional: Negative for fever and chills.  HENT: Negative for neck pain and neck stiffness.   Eyes: Negative for pain.  Respiratory: Negative for shortness of breath.   Cardiovascular: Positive for chest pain.  Gastrointestinal: Positive for nausea and abdominal pain.  Genitourinary: Negative for dysuria.  Musculoskeletal: Negative for back pain.  Skin: Negative for rash.  Neurological: Negative for headaches.  All other systems reviewed and are negative.    Allergies  Amoxicillin  Home Medications   Current Outpatient Rx  Name Route Sig Dispense Refill  . ACETAMINOPHEN 500 MG PO TABS Oral Take 1,000 mg by mouth every 6 (six) hours as needed. FOR PAIN    . ALBUTEROL SULFATE HFA 108 (  90 BASE) MCG/ACT IN AERS Inhalation Inhale 2 puffs into the lungs every 4 (four) hours as needed. SHORTNESS OF BREATH    . ALENDRONATE SODIUM 70 MG PO TABS Oral Take 70 mg by mouth every 7 (seven) days. Take with a full glass of water on an empty stomach. TAKES ON SUNDAYS    . AMLODIPINE BESYLATE 2.5 MG PO TABS Oral Take 2.5 mg by mouth daily.      . ARTIFICIAL TEARS OP SOLN Both Eyes Place 1 drop into both eyes 3 (three) times daily as needed. 1 drop both eyes two times a day as needed FOR DRY EYES    . ASPIRIN 81 MG PO TBEC Oral Take 81 mg by mouth daily.      Marland Kitchen CALCIUM CARBONATE-VITAMIN D 600-400 MG-UNIT PO CHEW Oral Chew 1 tablet by mouth 2 (two) times daily.      . OMEGA-3 FATTY ACIDS 1000 MG PO CAPS Oral Take 2 g by mouth daily.      Marland Kitchen GABAPENTIN 300 MG PO CAPS Oral Take 600 mg by mouth 2 (two) times daily.     Marland Kitchen  GLIPIZIDE 10 MG PO TABS Oral Take 10 mg by mouth daily.      Marland Kitchen HYDROCHLOROTHIAZIDE 25 MG PO TABS Oral Take 25 mg by mouth daily.      Marland Kitchen MAGNESIUM PO Oral Take 1 tablet by mouth every morning.    . ADULT MULTIVITAMIN W/MINERALS CH Oral Take 1 tablet by mouth daily.    Marland Kitchen NITROGLYCERIN 0.4 MG SL SUBL Sublingual Place 0.4 mg under the tongue every 5 (five) minutes as needed. FOR CHEST PAIN    . ROSUVASTATIN CALCIUM 20 MG PO TABS Oral Take 20 mg by mouth daily.      Marland Kitchen VITAMIN B-1 PO Oral Take 1 tablet by mouth every morning.    Marland Kitchen TRAMADOL HCL 50 MG PO TABS Oral Take 50 mg by mouth daily.     . VENLAFAXINE HCL 37.5 MG PO TABS Oral Take 1 tablet (37.5 mg total) by mouth daily. Pt takes in the morning.    . WARFARIN SODIUM 5 MG PO TABS Oral Take 7.5-10 mg by mouth daily. Take 2 tablets on mondays then take 1 and 1/2 tablets all other days in the evening      BP 92/39  Pulse 71  Temp(Src) 97.9 F (36.6 C) (Oral)  Resp 20  SpO2 97%  Physical Exam  Constitutional: She is oriented to person, place, and time. She appears well-developed and well-nourished.  HENT:  Head: Normocephalic and atraumatic.  Eyes: Conjunctivae and EOM are normal. Pupils are equal, round, and reactive to light.  Neck: Trachea normal. Neck supple. No thyromegaly present.  Cardiovascular: Normal rate, regular rhythm, S1 normal, S2 normal and normal pulses.     No systolic murmur is present   No diastolic murmur is present  Pulses:      Radial pulses are 2+ on the right side, and 2+ on the left side.  Pulmonary/Chest: Effort normal and breath sounds normal. She has no wheezes. She has no rhonchi. She has no rales. She exhibits no tenderness.  Abdominal: Soft. Normal appearance and bowel sounds are normal. There is no tenderness. There is no rebound, no guarding, no CVA tenderness and negative Murphy's sign.       Specifically no epigastric or left upper quadrant tenderness  Musculoskeletal:       BLE:s Calves nontender, no  cords or erythema, negative Homans sign  Neurological:  She is alert and oriented to person, place, and time. She has normal strength. No cranial nerve deficit or sensory deficit. GCS eye subscore is 4. GCS verbal subscore is 5. GCS motor subscore is 6.  Skin: Skin is warm and dry. No rash noted. She is not diaphoretic.  Psychiatric: Her speech is normal.       Cooperative and appropriate    ED Course  Procedures (including critical care time)  Labs Reviewed  CBC - Abnormal; Notable for the following:    HCT 35.7 (*)    All other components within normal limits  COMPREHENSIVE METABOLIC PANEL - Abnormal; Notable for the following:    Potassium 3.3 (*)    Glucose, Bld 130 (*)    Albumin 3.3 (*)    GFR calc non Af Amer 57 (*)    GFR calc Af Amer 66 (*)    All other components within normal limits  LIPASE, BLOOD - Abnormal; Notable for the following:    Lipase 96 (*)    All other components within normal limits  PROTIME-INR - Abnormal; Notable for the following:    Prothrombin Time 37.1 (*)    INR 3.68 (*)    All other components within normal limits  POCT I-STAT, CHEM 8 - Abnormal; Notable for the following:    Potassium 3.3 (*)    Glucose, Bld 133 (*)    Calcium, Ion 1.11 (*)    All other components within normal limits  D-DIMER, QUANTITATIVE  POCT I-STAT TROPONIN I   Dg Chest Portable 1 View  11/19/2011  *RADIOLOGY REPORT*  Clinical Data: Chest and abdominal pain.  PORTABLE CHEST - 1 VIEW  Comparison: 06/07/2011  Findings: Heart size and mediastinal contours at upper normal limits.  Aortic arch atherosclerosis.  Mild bibasilar opacities. No pleural effusion or pneumothorax.  Multilevel degenerative changes.  No acute osseous abnormality.  IMPRESSION:  Mild lung base opacities; atelectasis versus infiltrate.  Heart size and mediastinal contours upper normal limits, may be exaggerated by portable technique.  PA and lateral radiographs when the patient can tolerate recommended.   Original Report Authenticated By: Waneta Martins, M.D.    Date: 11/20/2011  Rate: 80  Rhythm: atrial fibrillation  QRS Axis: normal  Intervals: normal  ST/T Wave abnormalities: nonspecific ST/T changes  Conduction Disutrbances:right bundle branch block  Narrative Interpretation:   Old EKG Reviewed: unchanged  Recheck at 12:15 AM, some return of symptoms and GI cocktail ordered. No vomiting and no left upper quadrant tenderness. Lipase is noted and given minimal symptoms, will hold CT scan at this time pending further evaluation and consult with cardiology and medicine  12:32 AM discussed with cardiologist on call Dr. Charm Barges, recommends medical admission for atypical chest pain and elevated lipase. Will Follow as needed  12:55 AM chest as above with [facility] hospital[facility] Dr.Kakrakandy, who will admit, plan hold CT for now.   MDM  Epigastric and substernal chest pain in a patient with cardiac history found to have elevated lipase. No EKG changes and initial troponin negative. Plan medical admission for further evaluation and w/up.         Sunnie Nielsen, MD 11/20/11 [phone]

## 2011-11-20 NOTE — ED Notes (Signed)
Attempted to call report [facility] RN unavailable at this time.

## 2011-11-20 NOTE — H&P (Signed)
[name] is an 77 y.o. female.   PCP - Dr.Tammy Spears. Cardiologist - Dr.Ross([place]). Chief Complaint: Chest pain. HPI: 78 year-old female with history of CAD status post stenting, diabetes mellitus type 2, hypertension, atrial fibrillation on Coumadin had presented to the ER because of chest pain. Patient experienced chest pain last evening at 6 PM and pain persisted for around an hour or 2 and at that point patient decided to come to the ER. Patient's pain got relieved after she came here and received GI cocktail. Patient describes the pain as pressure like non-radiating. Denies any associated shortness of breath, dizziness, nausea vomiting or any diaphoresis. In addition patient's lipase was found to be high. Patient at this time is resting comfortably. Cardiologist on call for [place] Dr. Charm Barges was consulted by ER physician. As patient's EKG and cardiac enzymes does not show anything acute hospitalist admission has been requested.  Past Medical History  Diagnosis Date  . Allergic rhinitis   . Anemia     NOS chronic disease. Hgb 11.6gm% 04/14/2009  . CAD (coronary artery disease)   . Diabetes mellitus type II   . Diverticulitis of colon   . GERD (gastroesophageal reflux disease)   . Hyperlipidemia   . HTN (hypertension)   . DM neuropathy, type II diabetes mellitus   . IBS (irritable bowel syndrome)   . Spinal stenosis     djd lumbar  . Multinodular goiter   . Orthostatic hypotension   . Obesity     BMI-37  . Acute renal failure     due to dehydration-Sept 2009; Normal creat 1.2 on fu 05/13/2009  . Hx of colonoscopy   . Angina   . Cancer     on nose and had it removed  . DEMENTIA     slight  . Osteoarthritis     lower back  . Dysrhythmia   . Pneumonia   . PAF (paroxysmal atrial fibrillation)     noted on event monitor. No correlation with episodes and symptoms  . Chronic anticoagulation     Past Surgical History  Procedure Date  . Cholecystectomy   .  Abdominal hysterectomy   . Nasal sinus surgery     x2  . Bilateral arthroscopic knee surgery   . Stent surgery   . Coronary angioplasty     stent  . Carpel tunnel wrist rt     Family History  Problem Relation Age of Onset  . Breast cancer Sister   . Heart disease Brother   . Colon cancer Neg Hx    Social History:  reports that she has never smoked. She has never used smokeless tobacco. She reports that she does not drink alcohol or use illicit drugs.  Allergies:  Allergies  Allergen Reactions  . Amoxicillin Other (See Comments)    UNKNOWN     (Not in a hospital admission)  Results for orders placed during the hospital encounter of 11/19/11 (from the past 48 hour(s))  CBC     Status: Abnormal   Collection Time   11/19/11 11:02 PM      Component Value Range Comment   WBC 5.8  4.0 - 10.5 (K/uL)    RBC 3.97  3.87 - 5.11 (MIL/uL)    Hemoglobin 12.5  12.0 - 15.0 (g/dL)    HCT 40.3 (*) 47.4 - 46.0 (%)    MCV 89.9  78.0 - 100.0 (fL)    MCH 31.5  26.0 - 34.0 (pg)    MCHC 35.0  30.0 - 36.0 (g/dL)    RDW 16.1  09.6 - 04.5 (%)    Platelets 195  150 - 400 (K/uL)   COMPREHENSIVE METABOLIC PANEL     Status: Abnormal   Collection Time   11/19/11 11:02 PM      Component Value Range Comment   Sodium 136  135 - 145 (mEq/L)    Potassium 3.3 (*) 3.5 - 5.1 (mEq/L)    Chloride 100  96 - 112 (mEq/L)    CO2 26  19 - 32 (mEq/L)    Glucose, Bld 130 (*) 70 - 99 (mg/dL)    BUN 14  6 - 23 (mg/dL)    Creatinine, Ser 4.09  0.50 - 1.10 (mg/dL)    Calcium 8.5  8.4 - 10.5 (mg/dL)    Total Protein 6.0  6.0 - 8.3 (g/dL)    Albumin 3.3 (*) 3.5 - 5.2 (g/dL)    AST 34  0 - 37 (U/L)    ALT 24  0 - 35 (U/L)    Alkaline Phosphatase 43  39 - 117 (U/L)    Total Bilirubin 0.3  0.3 - 1.2 (mg/dL)    GFR calc non Af Amer 57 (*) >90 (mL/min)    GFR calc Af Amer 66 (*) >90 (mL/min)   D-DIMER, QUANTITATIVE     Status: Normal   Collection Time   11/19/11 11:02 PM      Component Value Range Comment    D-Dimer, Quant 0.29  0.00 - 0.48 (ug/mL-FEU)   LIPASE, BLOOD     Status: Abnormal   Collection Time   11/19/11 11:02 PM      Component Value Range Comment   Lipase 96 (*) 11 - 59 (U/L)   PROTIME-INR     Status: Abnormal   Collection Time   11/19/11 11:02 PM      Component Value Range Comment   Prothrombin Time 37.1 (*) 11.6 - 15.2 (seconds)    INR 3.68 (*) 0.00 - 1.49    POCT I-STAT TROPONIN I     Status: Normal   Collection Time   11/19/11 11:13 PM      Component Value Range Comment   Troponin i, poc 0.01  0.00 - 0.08 (ng/mL)    Comment 3            POCT I-STAT, CHEM 8     Status: Abnormal   Collection Time   11/19/11 11:16 PM      Component Value Range Comment   Sodium 138  135 - 145 (mEq/L)    Potassium 3.3 (*) 3.5 - 5.1 (mEq/L)    Chloride 102  96 - 112 (mEq/L)    BUN 15  6 - 23 (mg/dL)    Creatinine, Ser 8.11  0.50 - 1.10 (mg/dL)    Glucose, Bld 914 (*) 70 - 99 (mg/dL)    Calcium, Ion 7.82 (*) 1.12 - 1.32 (mmol/L)    TCO2 26  0 - 100 (mmol/L)    Hemoglobin 12.2  12.0 - 15.0 (g/dL)    HCT 95.6  21.3 - 08.6 (%)    Dg Chest Portable 1 View  11/19/2011  *RADIOLOGY REPORT*  Clinical Data: Chest and abdominal pain.  PORTABLE CHEST - 1 VIEW  Comparison: 06/07/2011  Findings: Heart size and mediastinal contours at upper normal limits.  Aortic arch atherosclerosis.  Mild bibasilar opacities. No pleural effusion or pneumothorax.  Multilevel degenerative changes.  No acute osseous abnormality.  IMPRESSION:  Mild lung base opacities; atelectasis  versus infiltrate.  Heart size and mediastinal contours upper normal limits, may be exaggerated by portable technique.  PA and lateral radiographs when the patient can tolerate recommended.  Original Report Authenticated By: Waneta Martins, M.D.    Review of Systems  Constitutional: Negative.   HENT: Negative.   Eyes: Negative.   Respiratory: Negative.   Cardiovascular: Positive for chest pain.  Gastrointestinal: Negative.     Genitourinary: Negative.   Musculoskeletal: Negative.   Skin: Negative.   Neurological: Negative.   Endo/Heme/Allergies: Negative.   Psychiatric/Behavioral: Negative.     Blood pressure 112/67, pulse 74, temperature 98.2 F (36.8 C), temperature source Oral, resp. rate 20, SpO2 94.00%. Physical Exam  Constitutional: She is oriented to person, place, and time. She appears well-developed and well-nourished. No distress.  HENT:  Head: Normocephalic and atraumatic.  Right Ear: External ear normal.  Left Ear: External ear normal.  Mouth/Throat: No oropharyngeal exudate.  Eyes: Conjunctivae are normal. Pupils are equal, round, and reactive to light. Right eye exhibits no discharge. Left eye exhibits no discharge. No scleral icterus.  Neck: Normal range of motion. Neck supple.  Cardiovascular: Normal rate and regular rhythm.   Respiratory: Effort normal and breath sounds normal. No respiratory distress. She has no wheezes. She has no rales.  GI: Soft. Bowel sounds are normal. She exhibits no distension. There is no tenderness. There is no rebound.  Musculoskeletal: Normal range of motion. She exhibits no edema and no tenderness.  Neurological: She is alert and oriented to person, place, and time.       Moves all extremities.  Skin: Skin is warm and dry. No rash noted. She is not diaphoretic. No erythema.     Assessment/Plan #1. Chest pain history of CAD status post stenting will rule out ACS - cycle cardiac markers. Continue aspirin. Nitroglycerin when necessary. #2. Elevated lipase - CT abdomen and pelvis to check for pancreas. Patient is presently pain free. #3. Diabetes mellitus2 - continue home medications with sliding scale coverage. #4. Atrial fibrillation rate controlled on Coumadin - Coumadin per pharmacy. #5. Hypertension and hyperlipidemia -  Continue present medications.  CODE STATUS - full code.  Christasia Angeletti N. 11/20/2011, 3:06 AM

## 2011-11-20 NOTE — Progress Notes (Signed)
Utilization review complete 

## 2011-11-20 NOTE — Progress Notes (Signed)
ANTICOAGULATION CONSULT NOTE - Initial Consult  Pharmacy Consult for Coumadin Indication: atrial fibrillation  Allergies  Allergen Reactions  . Amoxicillin Other (See Comments)    UNKNOWN    Patient Measurements: Height: 5\' 2"  (157.5 cm) Weight: 189 lb 1.6 oz (85.775 kg) (b scale) IBW/kg (Calculated) : 50.1   Vital Signs: Temp: 97.6 F (36.4 C) (04/30 0411) Temp src: Oral (04/30 0411) BP: 117/55 mmHg (04/30 0411) Pulse Rate: 81  (04/30 0411)  Labs:  Basename 11/19/11 2316 11/19/11 2302  HGB 12.2 12.5  HCT 36.0 35.7*  PLT -- 195  APTT -- --  LABPROT -- 37.1*  INR -- 3.68*  HEPARINUNFRC -- --  CREATININE 1.00 0.90  CKTOTAL -- --  CKMB -- --  TROPONINI -- --   Estimated Creatinine Clearance: 41.8 ml/min (by C-G formula based on Cr of 1).  Medical History: Past Medical History  Diagnosis Date  . Allergic rhinitis   . Anemia     NOS chronic disease. Hgb 11.6gm% 04/14/2009  . CAD (coronary artery disease)   . Diabetes mellitus type II   . Diverticulitis of colon   . GERD (gastroesophageal reflux disease)   . Hyperlipidemia   . HTN (hypertension)   . DM neuropathy, type II diabetes mellitus   . IBS (irritable bowel syndrome)   . Spinal stenosis     djd lumbar  . Multinodular goiter   . Orthostatic hypotension   . Obesity     BMI-37  . Acute renal failure     due to dehydration-Sept 2009; Normal creat 1.2 on fu 05/13/2009  . Hx of colonoscopy   . Angina   . Cancer     on nose and had it removed  . DEMENTIA     slight  . Osteoarthritis     lower back  . Dysrhythmia   . Pneumonia   . PAF (paroxysmal atrial fibrillation)     noted on event monitor. No correlation with episodes and symptoms  . Chronic anticoagulation     Medications:  Prescriptions prior to admission  Medication Sig Dispense Refill  . acetaminophen (TYLENOL) 500 MG tablet Take 1,000 mg by mouth every 6 (six) hours as needed. FOR PAIN      . albuterol (PROAIR HFA) 108 (90 BASE)  MCG/ACT inhaler Inhale 2 puffs into the lungs every 4 (four) hours as needed. SHORTNESS OF BREATH      . alendronate (FOSAMAX) 70 MG tablet Take 70 mg by mouth every 7 (seven) days. Take with a full glass of water on an empty stomach. TAKES ON SUNDAYS      . amLODipine (NORVASC) 2.5 MG tablet Take 2.5 mg by mouth daily.        . ARTIFICIAL TEARS ophthalmic solution Place 1 drop into both eyes 3 (three) times daily as needed. 1 drop both eyes two times a day as needed FOR DRY EYES      . aspirin 81 MG EC tablet Take 81 mg by mouth daily.        . Calcium Carbonate-Vitamin D (CALTRATE 600+D) 600-400 MG-UNIT per chew tablet Chew 1 tablet by mouth 2 (two) times daily.        . fish oil-omega-3 fatty acids 1000 MG capsule Take 2 g by mouth daily.        Marland Kitchen gabapentin (NEURONTIN) 300 MG capsule Take 600 mg by mouth 2 (two) times daily.       Marland Kitchen glipiZIDE (GLUCOTROL) 10 MG tablet Take 10 mg by  mouth daily.        . hydrochlorothiazide 25 MG tablet Take 25 mg by mouth daily.        Marland Kitchen MAGNESIUM PO Take 1 tablet by mouth every morning.      . Multiple Vitamin (MULITIVITAMIN WITH MINERALS) TABS Take 1 tablet by mouth daily.      . nitroGLYCERIN (NITROSTAT) 0.4 MG SL tablet Place 0.4 mg under the tongue every 5 (five) minutes as needed. FOR CHEST PAIN      . rosuvastatin (CRESTOR) 20 MG tablet Take 20 mg by mouth daily.        . Thiamine HCl (VITAMIN B-1 PO) Take 1 tablet by mouth every morning.      . traMADol (ULTRAM) 50 MG tablet Take 50 mg by mouth daily.       Marland Kitchen venlafaxine (EFFEXOR) 37.5 MG tablet Take 1 tablet (37.5 mg total) by mouth daily. Pt takes in the morning.      . warfarin (COUMADIN) 5 MG tablet Take 7.5-10 mg by mouth daily. Take 2 tablets on mondays then take 1 and 1/2 tablets all other days in the evening       Scheduled:    . amLODipine  2.5 mg Oral Daily  . aspirin EC  325 mg Oral Daily  . atorvastatin  40 mg Oral q1800  . gabapentin  600 mg Oral BID  . gi cocktail  30 mL Oral Once    . glipiZIDE  10 mg Oral Q breakfast  . insulin aspart  0-9 Units Subcutaneous TID WC  . omega-3 acid ethyl esters  2 g Oral Q breakfast  . ondansetron      . potassium chloride  20 mEq Oral Once  . sodium chloride  3 mL Intravenous Q12H  . traMADol  50 mg Oral Daily  . venlafaxine  37.5 mg Oral Daily    Assessment: 78yo female c/o non-radiating chest pressure, admitted for cardiac workup, to continue Coumadin for Afib; admitted with supratherapeutic INR.  Goal of Therapy:  INR 2-3   Plan:  Will hold off on dosing Coumadin until INR drops.  Colleen Can PharmD BCPS 11/20/2011,4:45 AM

## 2011-11-20 NOTE — Progress Notes (Signed)
Patient seen and examined, admitted by Dr. Toniann Fail this morning. Briefly, 78 year old female with history of coronary disease status post stenting,diabetes mellitus type 2, hypertension, atrial fibrillation on Coumadin had presented to the ER because of chest pain and epigastric pain. Currently improved, the patient's lipase was also noted to be elevated.  - Continue serial cardiac enzymes, d-dimer negative, last 2-D echo in 2010, EF 55-60%, atrial septal aneurysm. Will repeat 2-D echo, patient's cardiologist is Dr. Dietrich Pates. - CT abdomen results still pending - If medically stable, no acute issues overnight, hopefully DC home in a.m.   [name] M.D. [facility] 11/20/2011, 3:14 PM  Pager: [phone]

## 2011-11-21 DIAGNOSIS — R079 Chest pain, unspecified: Secondary | ICD-10-CM

## 2011-11-21 DIAGNOSIS — I10 Essential (primary) hypertension: Secondary | ICD-10-CM

## 2011-11-21 DIAGNOSIS — I2510 Atherosclerotic heart disease of native coronary artery without angina pectoris: Secondary | ICD-10-CM

## 2011-11-21 DIAGNOSIS — E1165 Type 2 diabetes mellitus with hyperglycemia: Secondary | ICD-10-CM

## 2011-11-21 DIAGNOSIS — I359 Nonrheumatic aortic valve disorder, unspecified: Secondary | ICD-10-CM

## 2011-11-21 LAB — BASIC METABOLIC PANEL
BUN: 14 mg/dL (ref 6–23)
GFR calc Af Amer: 63 mL/min — ABNORMAL LOW (ref >90)
GFR calc non Af Amer: 54 mL/min — ABNORMAL LOW (ref >90)
Potassium: 3.9 mEq/L (ref 3.5–5.1)
Sodium: 140 mEq/L (ref 135–145)

## 2011-11-21 LAB — GLUCOSE, CAPILLARY
Glucose-Capillary: 114 mg/dL — ABNORMAL HIGH (ref 70–99)
Glucose-Capillary: 93 mg/dL (ref 70–99)
Glucose-Capillary: 94 mg/dL (ref 70–99)

## 2011-11-21 LAB — PROTIME-INR
INR: 5.39 (ref 0.00–1.49)
Prothrombin Time: 50 seconds — ABNORMAL HIGH (ref 11.6–15.2)

## 2011-11-21 LAB — LIPASE, BLOOD: Lipase: 37 U/L (ref 11–59)

## 2011-11-21 MED ORDER — ASPIRIN EC 81 MG PO TBEC
81.0000 mg | DELAYED_RELEASE_TABLET | Freq: Every day | ORAL | Status: DC
  Administered 2011-11-22: 81 mg via ORAL
  Filled 2011-11-21: qty 1

## 2011-11-21 NOTE — Progress Notes (Signed)
RN notified that patients HR dropped to 44 on the monitor. Patient assessed. Vital signs stable. BP: 114/60, Resp: 18, 02 sat: 99% on RA. MD notified via text page. Charlane Westry, Melida Quitter

## 2011-11-21 NOTE — Progress Notes (Signed)
  Echocardiogram 2D Echocardiogram has been performed.  Jorje Guild [facility] 11/21/2011, 9:58 AM

## 2011-11-21 NOTE — Progress Notes (Signed)
Subjective: No new complaints.   Objective: Weight change: 0.998 kg (2 lb 3.2 oz)  Intake/Output Summary (Last 24 hours) at 11/21/11 1315 Last data filed at 11/21/11 1242  Gross per 24 hour  Intake 2640.75 ml  Output    400 ml  Net 2240.75 ml    Filed Vitals:   11/21/11 1016  BP: 111/58  Pulse:   Temp:   Resp:    On exam  She is alert afebrile comfortable CVS S1S2 heard Lungs clear Abdomen soft NT ND BS + Extremities: no pedal edema  Lab Results: Results for orders placed during the hospital encounter of 11/19/11 (from the past 24 hour(s))  CARDIAC PANEL(CRET KIN+CKTOT+MB+TROPI)     Status: Abnormal   Collection Time   11/20/11  8:41 PM      Component Value Range   Total CK 190 (*) 7 - 177 (U/L)   CK, MB 3.8  0.3 - 4.0 (ng/mL)   Troponin I <0.30  <0.30 (ng/mL)   Relative Index 2.0  0.0 - 2.5   GLUCOSE, CAPILLARY     Status: Abnormal   Collection Time   11/20/11  9:25 PM      Component Value Range   Glucose-Capillary 132 (*) 70 - 99 (mg/dL)  PROTIME-INR     Status: Abnormal   Collection Time   11/21/11  6:06 AM      Component Value Range   Prothrombin Time 50.0 (*) 11.6 - 15.2 (seconds)   INR 5.39 (*) 0.00 - 1.49   LIPASE, BLOOD     Status: Normal   Collection Time   11/21/11  6:06 AM      Component Value Range   Lipase 37  11 - 59 (U/L)  BASIC METABOLIC PANEL     Status: Abnormal   Collection Time   11/21/11  6:06 AM      Component Value Range   Sodium 140  135 - 145 (mEq/L)   Potassium 3.9  3.5 - 5.1 (mEq/L)   Chloride 106  96 - 112 (mEq/L)   CO2 26  19 - 32 (mEq/L)   Glucose, Bld 115 (*) 70 - 99 (mg/dL)   BUN 14  6 - 23 (mg/dL)   Creatinine, Ser 1.61  0.50 - 1.10 (mg/dL)   Calcium 8.0 (*) 8.4 - 10.5 (mg/dL)   GFR calc non Af Amer 54 (*) >90 (mL/min)   GFR calc Af Amer 63 (*) >90 (mL/min)  GLUCOSE, CAPILLARY     Status: Abnormal   Collection Time   11/21/11  6:06 AM      Component Value Range   Glucose-Capillary 114 (*) 70 - 99 (mg/dL)   Comment 1  Notify RN    GLUCOSE, CAPILLARY     Status: Abnormal   Collection Time   11/21/11 11:23 AM      Component Value Range   Glucose-Capillary 161 (*) 70 - 99 (mg/dL)   Comment 1 Documented in Chart     Comment 2 Notify RN    GLUCOSE, CAPILLARY     Status: Normal   Collection Time   11/21/11  4:24 PM      Component Value Range   Glucose-Capillary 93  70 - 99 (mg/dL)   Comment 1 Documented in Chart     Comment 2 Notify RN       Micro Results: Recent Results (from the past 240 hour(s))  MRSA PCR SCREENING     Status: Normal   Collection Time   11/20/11  11:33 AM      Component Value Range Status Comment   MRSA by PCR NEGATIVE  NEGATIVE  Final     Studies/Results: Ct Abdomen Pelvis W Contrast  11/20/2011  *RADIOLOGY REPORT*  Clinical Data: Abdominal pain.  Chest pain.  Elevated lipase.  CT ABDOMEN AND PELVIS WITH CONTRAST  Technique:  Multidetector CT imaging of the abdomen and pelvis was performed following the standard protocol during bolus administration of intravenous contrast.  Contrast: OMNIPAQUE IOHEXOL 300 MG/ML  SOLN  Comparison: 01/25/2009.  Findings: The lung bases are clear.  Minimal scarring changes. Coronary artery calcifications are noted.  Moderate aortic calcifications without aneurysm or dissection.  The liver is unremarkable.  No focal hepatic lesions or intrahepatic biliary dilatation.  A few tiny calcified granulomas are noted.  The gallbladder surgically absent.  No common bile duct dilatation.  The pancreas is unremarkable.  No findings for acute pancreatitis.  No pancreatic mass.  The spleen is normal in size. No focal lesions.  The adrenal glands and kidneys are unremarkable.  The stomach, duodenum, small bowel and colon are unremarkable except for moderate diverticulosis of the sigmoid colon and associated muscular thickening.  The appendix is normal.  No mesenteric or retroperitoneal masses or lymphadenopathy.  The aorta demonstrates moderate atherosclerotic  calcifications but no focal aneurysm or dissection.  The major branch vessels are patent. Calcifications are noted at the major branch vessel ostia.  The uterus is surgically absent.  The ovaries are not identified. The bladder is normal.  No pelvic mass, adenopathy or significant free pelvic fluid collections.  No inguinal mass or hernia.  The bony pelvis is intact.  Advanced facet degenerative changes are noted in the lower lumbar spine.  IMPRESSION: No acute abdominal/pelvic findings, mass lesions or adenopathy.  Original Report Authenticated By: P. Loralie Champagne, M.D.   Dg Chest Portable 1 View  11/19/2011  *RADIOLOGY REPORT*  Clinical Data: Chest and abdominal pain.  PORTABLE CHEST - 1 VIEW  Comparison: 06/07/2011  Findings: Heart size and mediastinal contours at upper normal limits.  Aortic arch atherosclerosis.  Mild bibasilar opacities. No pleural effusion or pneumothorax.  Multilevel degenerative changes.  No acute osseous abnormality.  IMPRESSION:  Mild lung base opacities; atelectasis versus infiltrate.  Heart size and mediastinal contours upper normal limits, may be exaggerated by portable technique.  PA and lateral radiographs when the patient can tolerate recommended.  Original Report Authenticated By: Waneta Martins, M.D.   Medications: Scheduled Meds:   . amLODipine  2.5 mg Oral Daily  . aspirin EC  81 mg Oral Daily  . atorvastatin  40 mg Oral q1800  . gabapentin  600 mg Oral BID  . glipiZIDE  10 mg Oral Q breakfast  . insulin aspart  0-9 Units Subcutaneous TID WC  . omega-3 acid ethyl esters  2 g Oral Q breakfast  . sodium chloride  3 mL Intravenous Q12H  . traMADol  50 mg Oral Daily  . venlafaxine  37.5 mg Oral Daily  . Warfarin - Pharmacist Dosing Inpatient   Does not apply q1800  . DISCONTD: aspirin EC  325 mg Oral Daily  . DISCONTD: feeding supplement  237 mL Oral BID BM   Continuous Infusions:   . 0.9 % NaCl with KCl 20 mEq / L 75 mL/hr at 11/20/11 2119   PRN  Meds:.acetaminophen, acetaminophen, albuterol, nitroGLYCERIN, ondansetron (ZOFRAN) IV, ondansetron, polyvinyl alcohol  Assessment/Plan: Patient Active Hospital Problem List: Chest pain (11/20/2011) Possibly non cardiac, awaiting 2d  echocardiogram results and cardiology evaluation.  DIABETES MELLITUS, TYPE II (02/12/2007)   On glipizide and SSI HYPERTENSION (02/12/2007)   CONTROLLED Coronary artery disease (04/05/2011)  stable.  Atrial fibrillation (06/25/2011)  rate controlled. Supra therapeutic INR. Will hold coumadin tonight.  Disposition: possible d/c in am .   LOS: 2 days   [name] 11/21/2011, 1:15 PM

## 2011-11-21 NOTE — Progress Notes (Signed)
CRITICAL VALUE ALERT  Critical value received:  INR 5.39  Date of notification:  11-21-11  Time of notification:  1156  Critical value read back: yes  Nurse who received alert: Santina  MD notified (1st page):Akula  Time of first page:  0700  MD notified (2nd page): Blake Divine  Time of second page:1156  Responding [id number]  Time MD responded:  1157

## 2011-11-21 NOTE — Progress Notes (Signed)
ANTICOAGULATION CONSULT NOTE - Follow Up Consult  Pharmacy Consult for coumadin Indication: atrial fibrillation  Allergies  Allergen Reactions  . Amoxicillin Other (See Comments)    UNKNOWN    Patient Measurements: Height: 5\' 2"  (157.5 cm) Weight: 191 lb 4.8 oz (86.773 kg) (Scale B) IBW/kg (Calculated) : 50.1    Vital Signs: Temp: 98.4 F (36.9 C) (05/01 0455) Temp src: Oral (05/01 0455) BP: 114/60 mmHg (05/01 0455) Pulse Rate: 66  (05/01 0455)  Labs:  Basename 11/21/11 0606 11/20/11 2041 11/20/11 1344 11/20/11 0459 11/19/11 2316 11/19/11 2302  HGB -- -- -- 12.6 12.2 --  HCT -- -- -- 35.7* 36.0 35.7*  PLT -- -- -- 190 -- 195  APTT -- -- -- -- -- --  LABPROT 50.0* -- -- 38.3* -- 37.1*  INR 5.39* -- -- 3.84* -- 3.68*  HEPARINUNFRC -- -- -- -- -- --  CREATININE 0.93 -- -- 0.88 1.00 --  CKTOTAL -- 190* 119 112 -- --  CKMB -- 3.8 2.7 2.5 -- --  TROPONINI -- <0.30 <0.30 <0.30 -- --   Estimated Creatinine Clearance: 45.2 ml/min (by C-G formula based on Cr of 0.93).   Medications:  Scheduled:    . amLODipine  2.5 mg Oral Daily  . aspirin EC  325 mg Oral Daily  . atorvastatin  40 mg Oral q1800  . feeding supplement  237 mL Oral BID BM  . gabapentin  600 mg Oral BID  . glipiZIDE  10 mg Oral Q breakfast  . insulin aspart  0-9 Units Subcutaneous TID WC  . iohexol  20 mL Oral Q1 Hr x 2  . omega-3 acid ethyl esters  2 g Oral Q breakfast  . ondansetron      . sodium chloride  3 mL Intravenous Q12H  . traMADol  50 mg Oral Daily  . venlafaxine  37.5 mg Oral Daily  . Warfarin - Pharmacist Dosing Inpatient   Does not apply q1800  . DISCONTD: glipiZIDE  10 mg Oral Q breakfast    Assessment: 78yo female c/o non-radiating chest pressure, admitted for cardiac workup, to continue Coumadin for Afib; admitted with supratherapeutic INR. INR is currently 5.38 with trend up. No major drug interactions noted with home medications  Goal of Therapy:  INR 2-3   Plan:  -Continue to  hold coumadin -Will follow progress  Benny Lennert 11/21/2011,9:27 AM

## 2011-11-21 NOTE — Progress Notes (Signed)
INITIAL ADULT NUTRITION ASSESSMENT Date: 11/21/2011   Time: 12:26 PM Reason for Assessment: Consult  ASSESSMENT: Female 78 y.o.  Dx: Chest pain  Hx:  Past Medical History  Diagnosis Date  . Allergic rhinitis   . Anemia     NOS chronic disease. Hgb 11.6gm% 04/14/2009  . CAD (coronary artery disease)   . Diabetes mellitus type II   . Diverticulitis of colon   . GERD (gastroesophageal reflux disease)   . Hyperlipidemia   . HTN (hypertension)   . DM neuropathy, type II diabetes mellitus   . IBS (irritable bowel syndrome)   . Spinal stenosis     djd lumbar  . Multinodular goiter   . Orthostatic hypotension   . Obesity     BMI-37  . Acute renal failure     due to dehydration-Sept 2009; Normal creat 1.2 on fu 05/13/2009  . Hx of colonoscopy   . Angina   . Cancer     on nose and had it removed  . DEMENTIA     slight  . Osteoarthritis     lower back  . Dysrhythmia   . Pneumonia   . PAF (paroxysmal atrial fibrillation)     noted on event monitor. No correlation with episodes and symptoms  . Chronic anticoagulation   . Blood dyscrasia     chronic anticoagulant    Related Meds:  CMP     Component Value Date/Time   NA 140 11/21/2011 0606   K 3.9 11/21/2011 0606   CL 106 11/21/2011 0606   CO2 26 11/21/2011 0606   GLUCOSE 115* 11/21/2011 0606   BUN 14 11/21/2011 0606   CREATININE 0.93 11/21/2011 0606   CALCIUM 8.0* 11/21/2011 0606   PROT 6.0 11/20/2011 0459   ALBUMIN 3.2* 11/20/2011 0459   AST 35 11/20/2011 0459   ALT 25 11/20/2011 0459   ALKPHOS 41 11/20/2011 0459   BILITOT 0.3 11/20/2011 0459   GFRNONAA 54* 11/21/2011 0606   GFRAA 63* 11/21/2011 0606      Ht: 5\' 2"  (157.5 cm)  Wt: 191 lb 4.8 oz (86.773 kg) (Scale B)  Ideal Wt: 50 kg % Ideal Wt: 173%  Usual Wt:  Wt Readings from Last 3 Encounters:  11/21/11 191 lb 4.8 oz (86.773 kg)  08/07/11 192 lb (87.091 kg)  07/19/11 190 lb (86.183 kg)   % Usual Wt: ~100%  Body mass index is 34.99 kg/(m^2). pt is  obese  Food/Nutrition Related Hx: pt states that her appetite is ok, weight has been stable.   Labs:  CMP     Component Value Date/Time   NA 140 11/21/2011 0606   K 3.9 11/21/2011 0606   CL 106 11/21/2011 0606   CO2 26 11/21/2011 0606   GLUCOSE 115* 11/21/2011 0606   BUN 14 11/21/2011 0606   CREATININE 0.93 11/21/2011 0606   CALCIUM 8.0* 11/21/2011 0606   PROT 6.0 11/20/2011 0459   ALBUMIN 3.2* 11/20/2011 0459   AST 35 11/20/2011 0459   ALT 25 11/20/2011 0459   ALKPHOS 41 11/20/2011 0459   BILITOT 0.3 11/20/2011 0459   GFRNONAA 54* 11/21/2011 0606   GFRAA 63* 11/21/2011 0606   CBG (last 3)   Basename 11/21/11 1123 11/21/11 0606 11/20/11 2125  GLUCAP 161* 114* 132*    Lab Results  Component Value Date   HGBA1C  Value: 6.6 (NOTE)  According to the ADA Clinical Practice Recommendations for 2011, when HbA1c is used as a screening test:   >=6.5%   Diagnostic of Diabetes Mellitus           (if abnormal result  is confirmed)  5.7-6.4%   Increased risk of developing Diabetes Mellitus  References:Diagnosis and Classification of Diabetes Mellitus,Diabetes Care,2011,34(Suppl 1):S62-S69 and Standards of Medical Care in         Diabetes - 2011,Diabetes Care,2011,34  (Suppl 1):S11-S61.* 09/27/2010     Intake/Output Summary (Last 24 hours) at 11/21/11 1228 Last data filed at 11/21/11 0900  Gross per 24 hour  Intake 2418.75 ml  Output    225 ml  Net 2193.75 ml     Diet Order: Carb Control  Supplements/Tube Feeding: Ensure Complete BID  IVF:    0.9 % NaCl with KCl 20 mEq / L Last Rate: 75 mL/hr at 11/20/11 2119    Estimated Nutritional Needs:   Kcal: 1500-1700  Protein: 60-70 gm  Fluid: 1.5 - 1.7 L  Pt c/o high blood sugars and states that her Ensure has too much sugar. Pt does not drink any supplements at home, appetite seems good for her age, likely does not need nutrition supplements at this time. RD will d/c the supplements. Pt  agreeable.   No nutrition dx at this time.     EDUCATION NEEDS: -No education needs identified at this time  INTERVENTION: RD d/c'd Ensure Complete  Dietitian [phone]  DOCUMENTATION CODES Per approved criteria  -Obesity Unspecified    Clarene Duke MARIE 11/21/2011, 12:26 PM

## 2011-11-21 NOTE — Consult Note (Signed)
CARDIOLOGY CONSULT NOTE  Patient ID: GEARL KIMBROUGH, MRN: [id number], DOB/AGE: 76-Nov-1925 76 y.o. Admit date: 11/19/2011 Date of Consult: 11/21/2011  Primary Physician: Herb Grays, MD, MD Primary Cardiologist: Dr. Tenny Craw  Chief Complaint: Chest pain Reason for Consultation: Chest pain  HPI: 78 y.o. female w/ PMHx significant for CAD (s/p DES to LAD '05, ISR w/ cutting balloon '06), PAF (on coumadin), DMII, HTN, HLD, and GERD who presented to [facility] on 11/20/2011 with complaints of chest pain.  Last cardiac cath 08/2010 showed LAD 40%, patent stent, prox and mid RCA 30% and EF 60%. Last echo 03/2009 showed mild LVH, EF 55-60%, mild LAE, atrial septal aneurysm, no RWMAs. She wore an event monitor at the end of 2012 that revealed PAF, but this did not correlate with any symptoms. She was placed on coumadin. Carotid doppler 05/2011 showed 0-39% stenosis bilaterally.  She reports being in her usual state of health when on Monday she was sitting in a chair and began to burp and feel "indigestion". She took tums but it didn't get any better. She describes the substernal chest pain as "someone sitting on my chest". No radiation, diaphoresis, palpitations or sob. Some mild nausea. She called EMS who gave her 3 SL NTG with some relief. She denies recent illness, fever, chills, edema, orthopnea, PND, palpitations, abd pain, n/v, change in bladder or bowel habits, melena, hematochezia, immobilization or calf pain, or neurologic deficits. She is not very active other than occasional gardening and walking to her mailbox, which she can do without chest pain. She usually eats leafy green veggies a couple times a week and has not been eating them as often lately. No signs of bleeding at home.  In the ED EKG showed Atrial Fibrillation 80bpm, RBBB, no acute ST/T changes. CXR showed mild lung base opacities, atelectasis vs infiltrate. Labs significant for normal poc troponin, Lipase 96, INR 3.68, DDimer  0.29, WBC 5.8, K+ 3.3, Crt 0.9. Chest pain was relieved with GI cocktail. She was admitted by medicine for further evaluation and treatment. CT abd/pelvis was without acute findings. Echo showed normal LV size and systolic function with mild LVH, EF 55%, mild biatrial enlargement, normal RV size and systolic function, mild aortic insufficiency. Cardiac enzymes were cycled and remained normal (total CK mildly elevated at 190). Lipase normalized. She has not had any recurrence of pain. Cardiology was asked to assist in evaluation of chest pain.  Past Medical History  Diagnosis Date  . Allergic rhinitis   . Anemia     NOS chronic disease. Hgb 11.6gm% 04/14/2009  . CAD (coronary artery disease)   . Diabetes mellitus type II   . Diverticulitis of colon   . GERD (gastroesophageal reflux disease)   . Hyperlipidemia   . HTN (hypertension)   . DM neuropathy, type II diabetes mellitus   . IBS (irritable bowel syndrome)   . Spinal stenosis     djd lumbar  . Multinodular goiter   . Orthostatic hypotension   . Obesity     BMI-37  . Acute renal failure     due to dehydration-Sept 2009; Normal creat 1.2 on fu 05/13/2009  . Hx of colonoscopy   . Angina   . Cancer     on nose and had it removed  . DEMENTIA     slight  . Osteoarthritis     lower back  . Dysrhythmia   . Pneumonia   . PAF (paroxysmal atrial fibrillation)     noted  on event monitor. No correlation with episodes and symptoms  . Chronic anticoagulation   . Blood dyscrasia     chronic anticoagulant     08/2010 - Cardiac Cath 1. The left main coronary artery had no obstructive disease.  2. Left anterior descending was a large-caliber vessel that coursed to the apex. There was a moderate-sized diagonal branch that had mild plaque disease. Beyond this diagonal branch, there was a 40% stenosis followed by a stent that was patent. The caliber of the left anterior descending artery beyond the diagonal branch was significantly smaller than  the proximal vessel. There were no flow- limiting lesions noted in this vessel.  3. The circumflex artery gave off an early small-caliber obtuse marginal branch and a large bifurcating second obtuse marginal  branch. There was plaque disease, but no flow-limiting lesions.  4. The right coronary artery was a moderate-sized dominant vessel with serial 30% lesions throughout the proximal and mid vessel.  5. Left ventricular angiogram was performed in the RAO projection and showed normal left ventricular systolic function with ejection fraction of 60%. The aortic root was not dilated.  IMPRESSION:  1. Single-vessel coronary artery disease.  2. Patent stent in the mid left anterior descending artery.  3. Normal left ventricular systolic function.  4. No enlargement of the aortic root.  RECOMMENDATIONS: At this point, I do not see a reason for the patient's chest pain in regards to her coronary artery disease. Her disease appears stable since last catheterization in 2009. Her D-dimer was positive and because of this she will be treated with heparin for a presumptive diagnosis of pulmonary embolism. We will plan on performing CT angiography of the chest tomorrow to rule out pulmonary embolism. She will be monitored closely in the Step-Down ICU tonight.  Surgical History:  Past Surgical History  Procedure Date  . Cholecystectomy   . Abdominal hysterectomy   . Nasal sinus surgery     x2  . Bilateral arthroscopic knee surgery   . Stent surgery   . Coronary angioplasty     stent  . Carpel tunnel wrist rt      Home Meds: Medication Sig  acetaminophen (TYLENOL) 500 MG tablet Take 1,000 mg by mouth every 6 (six) hours as needed. FOR PAIN  albuterol (PROAIR HFA) 108 (90 BASE) MCG/ACT inhaler Inhale 2 puffs into the lungs every 4 (four) hours as needed. SHORTNESS OF BREATH  alendronate (FOSAMAX) 70 MG tablet Take 70 mg by mouth every 7 (seven) days. Take with a full glass of water on an empty stomach.  TAKES ON SUNDAYS  amLODipine (NORVASC) 2.5 MG tablet Take 2.5 mg by mouth daily.    ARTIFICIAL TEARS ophthalmic solution Place 1 drop into both eyes 3 (three) times daily as needed. 1 drop both eyes two times a day as needed FOR DRY EYES  aspirin 81 MG EC tablet Take 81 mg by mouth daily.    Calcium Carbonate-Vitamin D (CALTRATE 600+D) 600-400 MG-UNIT per chew tablet Chew 1 tablet by mouth 2 (two) times daily.    fish oil-omega-3 fatty acids 1000 MG capsule Take 2 g by mouth daily.    gabapentin (NEURONTIN) 300 MG capsule Take 600 mg by mouth 2 (two) times daily.   glipiZIDE (GLUCOTROL XL) 10 MG 24 hr tablet Take 10 mg by mouth daily.  hydrochlorothiazide 25 MG tablet Take 25 mg by mouth daily.    MAGNESIUM PO Take 1 tablet by mouth every morning.  Multiple Vitamin (MULITIVITAMIN WITH MINERALS)  TABS Take 1 tablet by mouth daily.  nitroGLYCERIN (NITROSTAT) 0.4 MG SL tablet Place 0.4 mg under the tongue every 5 (five) minutes as needed. FOR CHEST PAIN  rosuvastatin (CRESTOR) 20 MG tablet Take 20 mg by mouth daily.    Thiamine HCl (VITAMIN B-1 PO) Take 1 tablet by mouth every morning.  traMADol (ULTRAM) 50 MG tablet Take 50 mg by mouth daily.   venlafaxine (EFFEXOR) 37.5 MG tablet Take 1 tablet (37.5 mg total) by mouth daily. Pt takes in the morning.  warfarin (COUMADIN) 5 MG tablet Take 7.5-10 mg by mouth daily. Take 2 tablets on mondays then take 1 and 1/2 tablets all other days in the evening    Inpatient Medications:   . amLODipine  2.5 mg Oral Daily  . aspirin EC  81 mg Oral Daily  . atorvastatin  40 mg Oral q1800  . gabapentin  600 mg Oral BID  . glipiZIDE  10 mg Oral Q breakfast  . insulin aspart  0-9 Units Subcutaneous TID WC  . omega-3 acid ethyl esters  2 g Oral Q breakfast  . sodium chloride  3 mL Intravenous Q12H  . traMADol  50 mg Oral Daily  . venlafaxine  37.5 mg Oral Daily  . Warfarin - Pharmacist Dosing Inpatient   Does not apply q1800   . 0.9 % NaCl with KCl 20 mEq / L  75 mL/hr at 11/20/11 2119    Allergies:  Allergies  Allergen Reactions  . Amoxicillin Other (See Comments)    UNKNOWN   Social History  . Marital Status: Married   Occupational History  . retired    Social History Main Topics  . Smoking status: Never Smoker   . Smokeless tobacco: Never Used  . Alcohol Use: No  . Drug Use: No  . Sexually Active: No   Family History  Problem Relation Age of Onset  . Breast cancer Sister   . Heart disease Brother   . Colon cancer Neg Hx      Review of Systems: General: negative for chills, fever, night sweats or weight changes.  Cardiovascular: As per HPI Dermatological: negative for rash Respiratory: negative for cough or wheezing Urologic: negative for hematuria Abdominal: negative for vomiting, diarrhea, bright red blood per rectum, melena, or hematemesis Neurologic: negative for visual changes, syncope, or dizziness All other systems reviewed and are otherwise negative except as noted above.  Labs:  [facility] 11/20/11 2041 11/20/11 1344 11/20/11 0459  CKTOTAL 190* 119 112  CKMB 3.8 2.7 2.5  TROPONINI <0.30 <0.30 <0.30   Component Value Date   WBC 4.9 11/20/2011   HGB 12.6 11/20/2011   HCT 35.7* 11/20/2011   MCV 89.9 11/20/2011   PLT 190 11/20/2011    Lab 11/21/11 0606 11/20/11 0459  NA 140 --  K 3.9 --  CL 106 --  CO2 26 --  BUN 14 --  CREATININE 0.93 --  CALCIUM 8.0* --  PROT -- 6.0  BILITOT -- 0.3  ALKPHOS -- 41  ALT -- 25  AST -- 35  GLUCOSE 115* --     11/19/2011 23:02 11/20/2011 04:59  Lipase 96 (H) 42   Component Value Date   DDIMER 0.29 11/19/2011     11/21/2011 06:06  Prothrombin Time 50.0 (H)  INR 5.39 (HH)    Radiology/Studies:   11/21/11 - 2D Echocardiogram Study Conclusions: - Left ventricle: The cavity size was normal. Wall thickness was increased in a pattern of mild LVH. The estimated ejection fraction was  55%. Wall motion was normal; there were no regional wall motion abnormalities. Indeterminate  diastolic function. - Aortic valve: There was no stenosis. Mild regurgitation. - Mitral valve: Trivial regurgitation. - Left atrium: The atrium was mildly dilated. - Right ventricle: The cavity size was normal. Systolic function was normal. - Right atrium: The atrium was mildly dilated. - Tricuspid valve: Peak RV-RA gradient: 23mm Hg (S). - Pulmonary arteries: PA systolic pressure 29-33 mmHg. - Systemic veins: IVC measured 2.0 cm with normal respirophasic variation, suggesting RA pressure 6-10 mmHg. Impressions: The patient was in atrial fibrillation. Normal LV size and systolic function with mild LV hypertrophy. EF 55%. Mild biatrial enlargement. Normal RV size and systolic function. Mild aortic insufficiency.  11/20/2011 - CT Abd/Pelvis Findings: The lung bases are clear.  Minimal scarring changes. Coronary artery calcifications are noted.  Moderate aortic calcifications without aneurysm or dissection.  The liver is unremarkable.  No focal hepatic lesions or intrahepatic biliary dilatation.  A few tiny calcified granulomas are noted.  The gallbladder surgically absent.  No common bile duct dilatation.  The pancreas is unremarkable.  No findings for acute pancreatitis.  No pancreatic mass.  The spleen is normal in size. No focal lesions.  The adrenal glands and kidneys are unremarkable.  The stomach, duodenum, small bowel and colon are unremarkable except for moderate diverticulosis of the sigmoid colon and associated muscular thickening.  The appendix is normal.  No mesenteric or retroperitoneal masses or lymphadenopathy.  The aorta demonstrates moderate atherosclerotic calcifications but no focal aneurysm or dissection.  The major branch vessels are patent. Calcifications are noted at the major branch vessel ostia.  The uterus is surgically absent.  The ovaries are not identified. The bladder is normal.  No pelvic mass, adenopathy or significant free pelvic fluid collections.  No inguinal mass or  hernia.  The bony pelvis is intact.  Advanced facet degenerative changes are noted in the lower lumbar spine.  IMPRESSION: No acute abdominal/pelvic findings, mass lesions or adenopathy.   11/19/2011 - CXR Findings: Heart size and mediastinal contours at upper normal limits.  Aortic arch atherosclerosis.  Mild bibasilar opacities. No pleural effusion or pneumothorax.  Multilevel degenerative changes.  No acute osseous abnormality.  IMPRESSION:  Mild lung base opacities; atelectasis versus infiltrate.  Heart size and mediastinal contours upper normal limits, may be exaggerated by portable technique.  PA and lateral radiographs when the patient can tolerate recommended.     EKG: 11/19/11 @ 2223 - A. Fib 80bpm, RBBB, no acute ST/T changes Tele: A. Fib 60-80s (some bursts in 110-120, as low as 44 while sleeping)  Physical Exam: Blood pressure 111/58, pulse 66, temperature 98.4 F (36.9 C), temperature source Oral, resp. rate 18, height 5\' 2"  (1.575 m), weight 191 lb 4.8 oz (86.773 kg), SpO2 99.00%. General: Pleasant overweight white elderly female in no acute distress. Head: Normocephalic, atraumatic, sclera non-icteric, no xanthomas, nares are without discharge.  Neck: Supple. Left carotid bruit. JVD not elevated. Lungs: Clear bilaterally to auscultation without wheezes, rales, or rhonchi. Breathing is unlabored. Heart: Irregularly irregular with S1 S2. No murmurs, rubs, or gallops appreciated. Abdomen: Soft, non-tender, non-distended with normoactive bowel sounds. No hepatomegaly. No rebound/guarding. No obvious abdominal masses. Msk:  Strength and tone appear normal for age. Extremities: No clubbing or cyanosis. Trace BLE edema.  Distal pedal pulses are intact and equal bilaterally. Neuro: Alert and oriented X 3. Moves all extremities spontaneously. Psych:  Responds to questions appropriately with a normal affect.   Assessment and  Plan:  78 y.o. female w/ PMHx significant for CAD (s/p DES to LAD  '05, ISR w/ cutting balloon '06), PAF (on coumadin), DMII, HTN, HLD, and GERD who presented to [facility] on 11/20/2011 with complaints of chest pain.  1. Chest Pain: Patient has a h/o CAD s/p DES to LAD '05 and subsequent ISR requiring cutting balloon in '06. Last cath in 08/2010 showed patent stent and otherwise mild nonobstructive dz, normal LV systolic function. She now presents with sudden onset chest pain somewhat relieved with NTG. Cardiac enzymes negative and no acute EKG changes to suggest ACS. Echo without acute changes, EF 55%, no WMAs, no significant valvular abnls. DDimer normal. CT Abd/pelvis without acute findings. CXR with mild bibasilar opacities, WBC normal, afebrile without clinical symptoms to suggest PNA. Do not suspect chest pain is cardiac in etiology and do not recommend further cardiac work up at this time.   2. Atrial Fibrillation: H/o asymptomatic PAF on event monitor at the end of 2012, placed on coumadin. She is currently in a.fib, rate controlled. Has had some bursts in the 110-120s, but has remained asymptomatic and I don't suspect her chest pain is related. INR supratherapeutic. May be due to lack of normal leafy green intake. No active signs of bleeding, H&H stable. Coumadin on hold. Will need close f/u with coumadin clinic.  Signed, HOPE, JESSICA PA-C 11/21/2011, 1:29 PM  Patient seen and examined.  I agree with the physical findings and plan as outlined above.  She has had no further symptoms since admission.  She is tolerating ambulation in the room.  Her protimes have generally been well controlled but on this admission she is supratherapeutc which she ascribes to eating less green vegetables than usual. Her physical exam is positive for left carotid bruit which has already been evaluated and is not significant at this time. No further inpatient cardiac studies anticipated.

## 2011-11-22 DIAGNOSIS — I2510 Atherosclerotic heart disease of native coronary artery without angina pectoris: Secondary | ICD-10-CM

## 2011-11-22 DIAGNOSIS — I10 Essential (primary) hypertension: Secondary | ICD-10-CM

## 2011-11-22 DIAGNOSIS — E1165 Type 2 diabetes mellitus with hyperglycemia: Secondary | ICD-10-CM

## 2011-11-22 DIAGNOSIS — R079 Chest pain, unspecified: Secondary | ICD-10-CM

## 2011-11-22 LAB — PROTIME-INR
INR: 3.44 — ABNORMAL HIGH (ref 0.00–1.49)
Prothrombin Time: 35.2 seconds — ABNORMAL HIGH (ref 11.6–15.2)

## 2011-11-22 LAB — GLUCOSE, CAPILLARY

## 2011-11-22 MED ORDER — WARFARIN SODIUM 5 MG PO TABS: 7.5000 mg | ORAL_TABLET | Freq: Every day | ORAL | Status: DC

## 2011-11-22 NOTE — Progress Notes (Signed)
DC IV, DC Home, DC Tele. Discharge instructions and home medications discussed with patient. Patient denies any questions or concerns at this time. Patient leaving unit via wheelchair and appears in no acute distress.

## 2011-11-22 NOTE — Progress Notes (Signed)
ANTICOAGULATION CONSULT NOTE - Follow Up Consult  Pharmacy Consult for coumadin Indication: atrial fibrillation  Allergies  Allergen Reactions  . Amoxicillin Other (See Comments)    UNKNOWN    Patient Measurements: Height: 5\' 2"  (157.5 cm) Weight: 193 lb 8 oz (87.771 kg) (scale B) IBW/kg (Calculated) : 50.1    Vital Signs: Temp: 97.8 F (36.6 C) (05/02 0500) Temp src: Oral (05/02 0500) BP: 118/60 mmHg (05/02 0500) Pulse Rate: 67  (05/02 0500)  Labs:  [facility] Philips 11/22/11 0527 11/21/11 0606 11/20/11 2041 11/20/11 1344 11/20/11 0459 11/19/11 2316 11/19/11 2302  HGB -- -- -- -- 12.6 12.2 --  HCT -- -- -- -- 35.7* 36.0 35.7*  PLT -- -- -- -- 190 -- 195  APTT -- -- -- -- -- -- --  LABPROT 35.2* 50.0* -- -- 38.3* -- --  INR 3.44* 5.39* -- -- 3.84* -- --  HEPARINUNFRC -- -- -- -- -- -- --  CREATININE -- 0.93 -- -- 0.88 1.00 --  CKTOTAL -- -- 190* 119 112 -- --  CKMB -- -- 3.8 2.7 2.5 -- --  TROPONINI -- -- <0.30 <0.30 <0.30 -- --   Estimated Creatinine Clearance: 45.5 ml/min (by C-G formula based on Cr of 0.93).   Medications:  Scheduled:     . amLODipine  2.5 mg Oral Daily  . aspirin EC  81 mg Oral Daily  . atorvastatin  40 mg Oral q1800  . gabapentin  600 mg Oral BID  . glipiZIDE  10 mg Oral Q breakfast  . insulin aspart  0-9 Units Subcutaneous TID WC  . omega-3 acid ethyl esters  2 g Oral Q breakfast  . sodium chloride  3 mL Intravenous Q12H  . traMADol  50 mg Oral Daily  . venlafaxine  37.5 mg Oral Daily  . Warfarin - Pharmacist Dosing Inpatient   Does not apply q1800  . DISCONTD: aspirin EC  325 mg Oral Daily  . DISCONTD: feeding supplement  237 mL Oral BID BM    Assessment: 78yo female c/o non-radiating chest pressure, admitted for cardiac workup, to continue Coumadin for Afib; admitted with supratherapeutic INR. INR is currently 3.44 (down from 5.39) . No major drug interactions noted with home medications. Noted for possible d/c soon.  Goal of Therapy:    INR 2-3   Plan:  -Continue to hold coumadin -Will follow progress  Benny Lennert 11/22/2011,9:11 AM

## 2011-11-22 NOTE — Progress Notes (Signed)
Subjective: No CP  NO SOB> Objective: Filed Vitals:   11/21/11 1016 11/21/11 1400 11/21/11 2139 11/22/11 0500  BP: 111/58 115/71 115/52 118/60  Pulse:  90 70 67  Temp:  97.1 F (36.2 C) 98.3 F (36.8 C) 97.8 F (36.6 C)  TempSrc:  Oral Oral Oral  Resp:  20 18 18   Height:      Weight:    193 lb 8 oz (87.771 kg)  SpO2:  95% 98% 97%   Weight change: 2 lb 3.2 oz (0.998 kg)  Intake/Output Summary (Last 24 hours) at 11/22/11 0826 Last data filed at 11/21/11 2140  Gross per 24 hour  Intake 1516.5 ml  Output    700 ml  Net  816.5 ml    General: Alert, awake, oriented x3, in no acute distress Neck:  JVP is normal Heart: Irregular rate and rhythm, without murmurs, rubs, gallops.  Lungs: Clear to auscultation.  No rales or wheezes. Exemities:  No edema.   Neuro: Grossly intact, nonfocal.  Tele:  Afib  Rates 70 to 80s  Lab Results: Results for orders placed during the hospital encounter of 11/19/11 (from the past 24 hour(s))  GLUCOSE, CAPILLARY     Status: Abnormal   Collection Time   11/21/11 11:23 AM      Component Value Range   Glucose-Capillary 161 (*) 70 - 99 (mg/dL)   Comment 1 Documented in Chart     Comment 2 Notify RN    GLUCOSE, CAPILLARY     Status: Normal   Collection Time   11/21/11  4:24 PM      Component Value Range   Glucose-Capillary 93  70 - 99 (mg/dL)   Comment 1 Documented in Chart     Comment 2 Notify RN    GLUCOSE, CAPILLARY     Status: Normal   Collection Time   11/21/11  9:37 PM      Component Value Range   Glucose-Capillary 94  70 - 99 (mg/dL)  PROTIME-INR     Status: Abnormal   Collection Time   11/22/11  5:27 AM      Component Value Range   Prothrombin Time 35.2 (*) 11.6 - 15.2 (seconds)   INR 3.44 (*) 0.00 - 1.49   GLUCOSE, CAPILLARY     Status: Abnormal   Collection Time   11/22/11  6:25 AM      Component Value Range   Glucose-Capillary 101 (*) 70 - 99 (mg/dL)   Comment 1 Notify RN     Comment 2 Documented in Chart       Studies/Results: Ct Abdomen Pelvis W Contrast  11/20/2011  *RADIOLOGY REPORT*  Clinical Data: Abdominal pain.  Chest pain.  Elevated lipase.  CT ABDOMEN AND PELVIS WITH CONTRAST  Technique:  Multidetector CT imaging of the abdomen and pelvis was performed following the standard protocol during bolus administration of intravenous contrast.  Contrast: OMNIPAQUE IOHEXOL 300 MG/ML  SOLN  Comparison: 01/25/2009.  Findings: The lung bases are clear.  Minimal scarring changes. Coronary artery calcifications are noted.  Moderate aortic calcifications without aneurysm or dissection.  The liver is unremarkable.  No focal hepatic lesions or intrahepatic biliary dilatation.  A few tiny calcified granulomas are noted.  The gallbladder surgically absent.  No common bile duct dilatation.  The pancreas is unremarkable.  No findings for acute pancreatitis.  No pancreatic mass.  The spleen is normal in size. No focal lesions.  The adrenal glands and kidneys are unremarkable.  The stomach,  duodenum, small bowel and colon are unremarkable except for moderate diverticulosis of the sigmoid colon and associated muscular thickening.  The appendix is normal.  No mesenteric or retroperitoneal masses or lymphadenopathy.  The aorta demonstrates moderate atherosclerotic calcifications but no focal aneurysm or dissection.  The major branch vessels are patent. Calcifications are noted at the major branch vessel ostia.  The uterus is surgically absent.  The ovaries are not identified. The bladder is normal.  No pelvic mass, adenopathy or significant free pelvic fluid collections.  No inguinal mass or hernia.  The bony pelvis is intact.  Advanced facet degenerative changes are noted in the lower lumbar spine.  IMPRESSION: No acute abdominal/pelvic findings, mass lesions or adenopathy.  Original Report Authenticated By: P. Loralie Champagne, M.D.    Medications: I have reviewed the patient's current medications.   Patient Active  Hospital Problem List: Chest pain (11/20/2011)   Assessment: Does not appear to be cardiac.  Encouraged pt to ambulate.  Can go home from cardiac standpoint.  Will make sure she has continued f/u in cardiology. HYPERTENSION (02/12/2007)   Assessment: Good control  Coronary artery disease (04/05/2011)   Assessment: As noted above.   Atrial fibrillation (06/25/2011)   Assessment: Rates controlled  Continue coumadin.  HOld today.  Resume tomorrow.  WIll make sure she has f/u in clinic.   LOS: 3 days   Dietrich Pates 11/22/2011, 8:26 AM

## 2011-11-23 ENCOUNTER — Ambulatory Visit (INDEPENDENT_AMBULATORY_CARE_PROVIDER_SITE_OTHER): Payer: Medicare Other | Admitting: Pharmacist

## 2011-11-23 DIAGNOSIS — I4891 Unspecified atrial fibrillation: Secondary | ICD-10-CM

## 2011-11-23 DIAGNOSIS — Z7901 Long term (current) use of anticoagulants: Secondary | ICD-10-CM

## 2011-11-23 LAB — POCT INR: INR: 1.8

## 2011-11-26 NOTE — Discharge Summary (Signed)
DISCHARGE SUMMARY  EYVETTE CORDON  MR#: [id number]  DOB:05-16-1924  Date of Admission: 11/19/2011 Date of Discharge: 11/26/2011  Attending Physician:Rakisha Pincock  Patient's JYN:WGNFA, Babette Relic, MD, MD  Consults:Treatment Team:  Dory Peru [facility], MD  Discharge Diagnoses: Present on Admission:  .Chest pain .Coronary artery disease .Atrial fibrillation .DIABETES MELLITUS, TYPE II .HYPERTENSION   Initial presentation: 78 year-old female with history of CAD status post stenting, diabetes mellitus type 2, hypertension, atrial fibrillation on Coumadin had presented to the ER because of chest pain. Patient experienced chest pain last evening at 6 PM and pain persisted for around an hour or 2 and at that point patient decided to come to the ER. Patient's pain got relieved after she came here and received GI cocktail. Patient describes the pain as pressure like non-radiating. Denies any associated shortness of breath, dizziness, nausea vomiting or any diaphoresis. In addition patient's lipase was found to be high. Patient at this time is resting comfortably. Cardiologist on call for [place] Dr. Charm Barges was consulted by ER physician. As patient's EKG and cardiac enzymes does not show anything acute hospitalist admission has been requested.    Hospital Course: Atypical Chest pain (11/20/2011) Possibly non cardiac, Cardiac enzymes have been negative. EKG does not show signs of acute ischemia. 2d echo no significant change when compared to old one. Cardiology consult called and no further cardiology interventions recommended.  DIABETES MELLITUS, TYPE II (02/12/2007) On glipizide and SSI HYPERTENSION (02/12/2007) CONTROLLED Coronary artery disease (04/05/2011) stable.  Atrial fibrillation (06/25/2011) rate controlled. Supra therapeutic INR. Recommended to hold coumadin tonight and restart tomorrow. Check INR tomorrow.    Medication List  As of 11/26/2011 12:51 AM   STOP taking these medications          glipiZIDE 10 MG tablet         TAKE these medications         acetaminophen 500 MG tablet   Commonly known as: TYLENOL   Take 1,000 mg by mouth every 6 (six) hours as needed. FOR PAIN      alendronate 70 MG tablet   Commonly known as: FOSAMAX   Take 70 mg by mouth every 7 (seven) days. Take with a full glass of water on an empty stomach. TAKES ON SUNDAYS      amLODipine 2.5 MG tablet   Commonly known as: NORVASC   Take 2.5 mg by mouth daily.      ARTIFICIAL TEARS ophthalmic solution   Place 1 drop into both eyes 3 (three) times daily as needed. 1 drop both eyes two times a day as needed FOR DRY EYES      aspirin 81 MG EC tablet   Take 81 mg by mouth daily.      CALTRATE 600+D 600-400 MG-UNIT per chew tablet   Generic drug: Calcium Carbonate-Vitamin D   Chew 1 tablet by mouth 2 (two) times daily.      EFFEXOR 37.5 MG tablet   Generic drug: venlafaxine   Take 1 tablet (37.5 mg total) by mouth daily. Pt takes in the morning.      fish oil-omega-3 fatty acids 1000 MG capsule   Take 2 g by mouth daily.      gabapentin 300 MG capsule   Commonly known as: NEURONTIN   Take 600 mg by mouth 2 (two) times daily.      glipiZIDE 10 MG 24 hr tablet   Commonly known as: GLUCOTROL XL   Take 10 mg by mouth daily.  hydrochlorothiazide 25 MG tablet   Commonly known as: HYDRODIURIL   Take 25 mg by mouth daily.      MAGNESIUM PO   Take 1 tablet by mouth every morning.      mulitivitamin with minerals Tabs   Take 1 tablet by mouth daily.      nitroGLYCERIN 0.4 MG SL tablet   Commonly known as: NITROSTAT   Place 0.4 mg under the tongue every 5 (five) minutes as needed. FOR CHEST PAIN      PROAIR HFA 108 (90 BASE) MCG/ACT inhaler   Generic drug: albuterol   Inhale 2 puffs into the lungs every 4 (four) hours as needed. SHORTNESS OF BREATH      rosuvastatin 20 MG tablet   Commonly known as: CRESTOR   Take 20 mg by mouth daily.      traMADol 50 MG tablet   Commonly  known as: ULTRAM   Take 50 mg by mouth daily.      VITAMIN B-1 PO   Take 1 tablet by mouth every morning.      warfarin 5 MG tablet   Commonly known as: COUMADIN   Take 1.5-2 tablets (7.5-10 mg total) by mouth daily. Take 2 tablets on mondays then take 1 and 1/2 tablets all other days in the evening             Day of Discharge BP 115/70  Pulse 88  Temp(Src) 97.8 F (36.6 C) (Oral)  Resp 18  Ht 5\' 2"  (1.575 m)  Wt 87.771 kg (193 lb 8 oz)  BMI 35.39 kg/m2  SpO2 97%  Physical Exam: On exam  She is alert afebrile comfortable  CVS S1S2 heard  Lungs clear  Abdomen soft NT ND BS +  Extremities: no pedal edema   No results found for this or any previous visit (from the past 24 hour(s)).  Disposition: HOME   Follow-up Appts: Discharge Orders    Future Appointments: Provider: Department: Dept Phone: Center:   12/07/2011 9:30 AM [facility] [phone] None   01/07/2012 11:45 AM Pricilla Riffle, MD [facility] [phone] [id number]     Future Orders Please Complete By Expires   Diet - low sodium heart healthy      Discharge instructions      Comments:   Check INR in am. Please call coumadin clinic today and make the appt to check INR IN AM.   Activity as tolerated - No restrictions           Tests Needing Follow-up: INR TOMORROW  Time spent in discharge (includes decision making & examination of pt): 55 minutes  Signed: Season Astacio 11/26/2011, 12:51 AM

## 2011-11-29 ENCOUNTER — Telehealth: Payer: Self-pay | Admitting: Internal Medicine

## 2011-11-29 NOTE — Telephone Encounter (Signed)
Reviewed normal Ct Abd/pelvis with pt which was done on ED visit recently. She states no one had told her the results. Mylo Red RN

## 2011-11-29 NOTE — Telephone Encounter (Signed)
Patient seen in the ER @ [facility], and would like to know the results of her stomach CT scan.  She can be reached at [phone]

## 2011-12-07 ENCOUNTER — Ambulatory Visit (INDEPENDENT_AMBULATORY_CARE_PROVIDER_SITE_OTHER): Payer: Medicare Other | Admitting: *Deleted

## 2011-12-07 DIAGNOSIS — I4891 Unspecified atrial fibrillation: Secondary | ICD-10-CM

## 2011-12-07 DIAGNOSIS — Z7901 Long term (current) use of anticoagulants: Secondary | ICD-10-CM

## 2011-12-07 LAB — POCT INR: INR: 3.1

## 2011-12-09 ENCOUNTER — Other Ambulatory Visit: Payer: Self-pay | Admitting: Internal Medicine

## 2011-12-28 ENCOUNTER — Ambulatory Visit (INDEPENDENT_AMBULATORY_CARE_PROVIDER_SITE_OTHER): Payer: Medicare Other | Admitting: *Deleted

## 2011-12-28 DIAGNOSIS — Z7901 Long term (current) use of anticoagulants: Secondary | ICD-10-CM

## 2011-12-28 DIAGNOSIS — I4891 Unspecified atrial fibrillation: Secondary | ICD-10-CM

## 2011-12-28 LAB — POCT INR: INR: 2.2

## 2012-01-07 ENCOUNTER — Ambulatory Visit (INDEPENDENT_AMBULATORY_CARE_PROVIDER_SITE_OTHER): Payer: Medicare Other | Admitting: Internal Medicine

## 2012-01-07 ENCOUNTER — Encounter: Payer: Self-pay | Admitting: Internal Medicine

## 2012-01-07 VITALS — BP 124/78 | HR 82 | Wt 190.0 lb

## 2012-01-07 DIAGNOSIS — I2510 Atherosclerotic heart disease of native coronary artery without angina pectoris: Secondary | ICD-10-CM

## 2012-01-07 NOTE — Patient Instructions (Addendum)
Your physician recommends that you schedule a follow-up appointment in:  MD will let pt know. Your physician recommends that you continue on your current medications as directed. Please refer to the Current Medication list given to you today.

## 2012-01-07 NOTE — Progress Notes (Signed)
HPI Patient is an 78 year old with a history of CAD and PAF.  She was last in clinic in January. She was admitted to [facility] in April with CP  R/O for MI  Felt to be noncardiac. Since d/c she has done well  She deneis CP. Breathing is OK  No palpitations.  Allergies  Allergen Reactions  . Amoxicillin Other (See Comments)    UNKNOWN    Current Outpatient Prescriptions  Medication Sig Dispense Refill  . acetaminophen (TYLENOL) 500 MG tablet Take 1,000 mg by mouth every 6 (six) hours as needed. FOR PAIN      . albuterol (PROAIR HFA) 108 (90 BASE) MCG/ACT inhaler Inhale 2 puffs into the lungs every 4 (four) hours as needed. SHORTNESS OF BREATH      . alendronate (FOSAMAX) 70 MG tablet Take 70 mg by mouth every 7 (seven) days. Take with a full glass of water on an empty stomach. TAKES ON SUNDAYS      . amLODipine (NORVASC) 2.5 MG tablet Take 2.5 mg by mouth daily.        . ARTIFICIAL TEARS ophthalmic solution Place 1 drop into both eyes 3 (three) times daily as needed. 1 drop both eyes two times a day as needed FOR DRY EYES      . aspirin 81 MG EC tablet Take 81 mg by mouth daily.        . Calcium Carbonate-Vitamin D (CALTRATE 600+D) 600-400 MG-UNIT per chew tablet Chew 1 tablet by mouth 2 (two) times daily.        . fish oil-omega-3 fatty acids 1000 MG capsule Take 2 g by mouth daily.        Marland Kitchen gabapentin (NEURONTIN) 300 MG capsule Take 600 mg by mouth 2 (two) times daily.       Marland Kitchen glipiZIDE (GLUCOTROL XL) 10 MG 24 hr tablet Take 10 mg by mouth daily.      . hydrochlorothiazide 25 MG tablet Take 25 mg by mouth daily.        Marland Kitchen MAGNESIUM PO Take 1 tablet by mouth every morning.      . Multiple Vitamin (MULITIVITAMIN WITH MINERALS) TABS Take 1 tablet by mouth daily.      . nitroGLYCERIN (NITROSTAT) 0.4 MG SL tablet Place 0.4 mg under the tongue every 5 (five) minutes as needed. FOR CHEST PAIN      . rosuvastatin (CRESTOR) 20 MG tablet Take 20 mg by mouth daily.        . Thiamine HCl (VITAMIN  B-1 PO) Take 1 tablet by mouth every morning.      . traMADol (ULTRAM) 50 MG tablet Take 50 mg by mouth daily.       Marland Kitchen venlafaxine (EFFEXOR) 37.5 MG tablet Take 1 tablet (37.5 mg total) by mouth daily. Pt takes in the morning.      . warfarin (COUMADIN) 5 MG tablet TAKE AS DIRECTED BY THE ANTICOAGULATION CLINIC  50 tablet  3    Past Medical History  Diagnosis Date  . Allergic rhinitis   . Anemia     NOS chronic disease. Hgb 11.6gm% 04/14/2009  . CAD (coronary artery disease)   . Diabetes mellitus type II   . Diverticulitis of colon   . GERD (gastroesophageal reflux disease)   . Hyperlipidemia   . HTN (hypertension)   . DM neuropathy, type II diabetes mellitus   . IBS (irritable bowel syndrome)   . Spinal stenosis     djd lumbar  .  Multinodular goiter   . Orthostatic hypotension   . Obesity     BMI-37  . Acute renal failure     due to dehydration-Sept 2009; Normal creat 1.2 on fu 05/13/2009  . Hx of colonoscopy   . Angina   . Cancer     on nose and had it removed  . DEMENTIA     slight  . Osteoarthritis     lower back  . Dysrhythmia   . Pneumonia   . PAF (paroxysmal atrial fibrillation)     noted on event monitor. No correlation with episodes and symptoms  . Chronic anticoagulation   . Blood dyscrasia     chronic anticoagulant    Past Surgical History  Procedure Date  . Cholecystectomy   . Abdominal hysterectomy   . Nasal sinus surgery     x2  . Bilateral arthroscopic knee surgery   . Stent surgery   . Coronary angioplasty     stent  . Carpel tunnel wrist rt     Family History  Problem Relation Age of Onset  . Breast cancer Sister   . Heart disease Brother   . Colon cancer Neg Hx     History   Social History  . Marital Status: Married    Spouse Name: N/A    Number of Children: N/A  . Years of Education: N/A   Occupational History  . retired    Social History Main Topics  . Smoking status: Never Smoker   . Smokeless tobacco: Never Used  .  Alcohol Use: No  . Drug Use: No  . Sexually Active: No   Other Topics Concern  . Not on file   Social History Narrative  . No narrative on file    Review of Systems:  All systems reviewed.  They are negative to the above problem except as previously stated.  Vital Signs: BP 124/78  Pulse 82  Wt 190 lb (86.183 kg)  Physical Exam  Patient in NAD   Examined in chair because of knee problems HEENT:  Normocephalic, atraumatic. EOMI, PERRLA.  Neck: JVP is normal. No thyromegaly. No bruits.  Lungs: clear to auscultation. No rales no wheezes.  Heart: Regular rate and rhythm. Normal S1, S2. No S3.   No significant murmurs. PMI not displaced.  Abdomen:  Supple, nontender. Normal bowel sounds. No masses. No hepatomegaly.  Extremities:   Good distal pulses throughout. No lower extremity edema.  Musculoskeletal :moving all extremities.  Neuro:   alert and oriented x3.  CN II-XII grossly intact.   Assessment and Plan:  1.  CAD.  No symptoms to sugg angina.  No changes in regimen planned. 2.  PAF.  Exam sugg that she is in SR>   Discussed anticoagulation with her.  She would like to have more freedom in diet.   Will try to find out copays for her for Xarelto and consider switching.  Needs anticoag 3.  HL.  Continue meds.

## 2012-01-07 NOTE — Progress Notes (Signed)
Patient ID: [name], female   DOB: 08/10/1923, 78 y.o.   MRN: [id number]  

## 2012-01-18 ENCOUNTER — Telehealth: Payer: Self-pay | Admitting: Internal Medicine

## 2012-01-18 NOTE — Telephone Encounter (Signed)
Called patient back. She reported that on Tuesday or Wednesday night she had a few minutes of racing and skipped beats not associated with any other symptoms. She is reporting this today but has not had any more symptoms since then. Advised that if this reoccurs she needs to call 911 to have the rhythm documented by EMS and transported to the hospital if needed. Advised will let Dr.Ross know and call her back again on Monday.

## 2012-01-18 NOTE — Telephone Encounter (Signed)
Pt calling re having palpitations, psl call, denies any other symptoms

## 2012-01-21 NOTE — Telephone Encounter (Signed)
Discussed above with Dr.Ross and she agreed with plan. I called patient back. She denies any episodes of "racing heart/skipped beats in the past few days. She knows what to do if they reoccur.

## 2012-01-25 ENCOUNTER — Ambulatory Visit (INDEPENDENT_AMBULATORY_CARE_PROVIDER_SITE_OTHER): Payer: Medicare Other | Admitting: *Deleted

## 2012-01-25 DIAGNOSIS — I4891 Unspecified atrial fibrillation: Secondary | ICD-10-CM

## 2012-01-25 DIAGNOSIS — Z7901 Long term (current) use of anticoagulants: Secondary | ICD-10-CM

## 2012-02-22 ENCOUNTER — Ambulatory Visit (INDEPENDENT_AMBULATORY_CARE_PROVIDER_SITE_OTHER): Payer: Medicare Other

## 2012-02-22 DIAGNOSIS — I4891 Unspecified atrial fibrillation: Secondary | ICD-10-CM

## 2012-02-22 DIAGNOSIS — Z7901 Long term (current) use of anticoagulants: Secondary | ICD-10-CM

## 2012-02-22 LAB — POCT INR: INR: 2.4

## 2012-03-17 ENCOUNTER — Telehealth: Payer: Self-pay | Admitting: Internal Medicine

## 2012-03-17 NOTE — Telephone Encounter (Signed)
LMOM for call back. 

## 2012-03-17 NOTE — Telephone Encounter (Signed)
Pt calling [phone] , bp 142/77 pulse rate 57 at 900a, pls call

## 2012-03-31 NOTE — Telephone Encounter (Signed)
Called patient to check on status. She feels well. States that BP yesterday was 125/66 with heart rate of 88. In call back to see Dr.Ross January 2014.

## 2012-04-04 ENCOUNTER — Ambulatory Visit (INDEPENDENT_AMBULATORY_CARE_PROVIDER_SITE_OTHER): Payer: Medicare Other | Admitting: *Deleted

## 2012-04-04 DIAGNOSIS — Z7901 Long term (current) use of anticoagulants: Secondary | ICD-10-CM

## 2012-04-04 DIAGNOSIS — I4891 Unspecified atrial fibrillation: Secondary | ICD-10-CM

## 2012-04-04 LAB — POCT INR: INR: 2.5

## 2012-04-30 ENCOUNTER — Other Ambulatory Visit: Payer: Self-pay | Admitting: Internal Medicine

## 2012-04-30 NOTE — Telephone Encounter (Signed)
Please refilled 

## 2012-05-06 ENCOUNTER — Other Ambulatory Visit: Payer: Self-pay | Admitting: Family Medicine

## 2012-05-06 DIAGNOSIS — Z1231 Encounter for screening mammogram for malignant neoplasm of breast: Secondary | ICD-10-CM

## 2012-05-07 ENCOUNTER — Other Ambulatory Visit: Payer: Self-pay | Admitting: Family Medicine

## 2012-05-07 DIAGNOSIS — Z1231 Encounter for screening mammogram for malignant neoplasm of breast: Secondary | ICD-10-CM

## 2012-05-16 ENCOUNTER — Ambulatory Visit (INDEPENDENT_AMBULATORY_CARE_PROVIDER_SITE_OTHER): Payer: Medicare Other | Admitting: *Deleted

## 2012-05-16 DIAGNOSIS — Z7901 Long term (current) use of anticoagulants: Secondary | ICD-10-CM

## 2012-05-16 DIAGNOSIS — I4891 Unspecified atrial fibrillation: Secondary | ICD-10-CM

## 2012-05-30 ENCOUNTER — Ambulatory Visit (INDEPENDENT_AMBULATORY_CARE_PROVIDER_SITE_OTHER): Payer: Medicare Other | Admitting: *Deleted

## 2012-05-30 DIAGNOSIS — I4891 Unspecified atrial fibrillation: Secondary | ICD-10-CM

## 2012-05-30 DIAGNOSIS — Z7901 Long term (current) use of anticoagulants: Secondary | ICD-10-CM

## 2012-06-06 ENCOUNTER — Ambulatory Visit (INDEPENDENT_AMBULATORY_CARE_PROVIDER_SITE_OTHER): Payer: Medicare Other | Admitting: *Deleted

## 2012-06-06 DIAGNOSIS — I4891 Unspecified atrial fibrillation: Secondary | ICD-10-CM

## 2012-06-06 DIAGNOSIS — Z7901 Long term (current) use of anticoagulants: Secondary | ICD-10-CM

## 2012-06-06 LAB — POCT INR: INR: 3.3

## 2012-06-11 ENCOUNTER — Ambulatory Visit
Admission: RE | Admit: 2012-06-11 | Discharge: 2012-06-11 | Disposition: A | Discharge status: Home / Self Care | Payer: Medicare Other | Source: Ambulatory Visit | Attending: Family Medicine | Admitting: Family Medicine

## 2012-06-11 DIAGNOSIS — Z1231 Encounter for screening mammogram for malignant neoplasm of breast: Secondary | ICD-10-CM

## 2012-06-20 ENCOUNTER — Ambulatory Visit (INDEPENDENT_AMBULATORY_CARE_PROVIDER_SITE_OTHER): Payer: Medicare Other | Admitting: *Deleted

## 2012-06-20 DIAGNOSIS — I4891 Unspecified atrial fibrillation: Secondary | ICD-10-CM

## 2012-06-20 DIAGNOSIS — Z7901 Long term (current) use of anticoagulants: Secondary | ICD-10-CM

## 2012-07-11 ENCOUNTER — Ambulatory Visit (INDEPENDENT_AMBULATORY_CARE_PROVIDER_SITE_OTHER): Payer: Medicare Other | Admitting: *Deleted

## 2012-07-11 DIAGNOSIS — Z7901 Long term (current) use of anticoagulants: Secondary | ICD-10-CM

## 2012-07-11 DIAGNOSIS — I4891 Unspecified atrial fibrillation: Secondary | ICD-10-CM

## 2012-07-16 ENCOUNTER — Observation Stay (HOSPITAL_COMMUNITY)
Admission: EM | Admit: 2012-07-16 | Discharge: 2012-07-18 | Disposition: A | Discharge status: Home / Self Care | Payer: Medicare Other | Attending: Internal Medicine | Admitting: Internal Medicine

## 2012-07-16 ENCOUNTER — Emergency Department (HOSPITAL_COMMUNITY): Payer: Medicare Other

## 2012-07-16 DIAGNOSIS — R1013 Epigastric pain: Secondary | ICD-10-CM | POA: Insufficient documentation

## 2012-07-16 DIAGNOSIS — I2510 Atherosclerotic heart disease of native coronary artery without angina pectoris: Secondary | ICD-10-CM

## 2012-07-16 DIAGNOSIS — K219 Gastro-esophageal reflux disease without esophagitis: Secondary | ICD-10-CM | POA: Insufficient documentation

## 2012-07-16 DIAGNOSIS — I4510 Unspecified right bundle-branch block: Secondary | ICD-10-CM | POA: Insufficient documentation

## 2012-07-16 DIAGNOSIS — R079 Chest pain, unspecified: Principal | ICD-10-CM | POA: Insufficient documentation

## 2012-07-16 DIAGNOSIS — Z79899 Other long term (current) drug therapy: Secondary | ICD-10-CM | POA: Insufficient documentation

## 2012-07-16 DIAGNOSIS — Z7901 Long term (current) use of anticoagulants: Secondary | ICD-10-CM | POA: Insufficient documentation

## 2012-07-16 DIAGNOSIS — E785 Hyperlipidemia, unspecified: Secondary | ICD-10-CM | POA: Insufficient documentation

## 2012-07-16 DIAGNOSIS — E119 Type 2 diabetes mellitus without complications: Secondary | ICD-10-CM | POA: Insufficient documentation

## 2012-07-16 DIAGNOSIS — M549 Dorsalgia, unspecified: Secondary | ICD-10-CM | POA: Insufficient documentation

## 2012-07-16 DIAGNOSIS — I10 Essential (primary) hypertension: Secondary | ICD-10-CM | POA: Insufficient documentation

## 2012-07-16 DIAGNOSIS — I4891 Unspecified atrial fibrillation: Secondary | ICD-10-CM | POA: Insufficient documentation

## 2012-07-16 DIAGNOSIS — I440 Atrioventricular block, first degree: Secondary | ICD-10-CM | POA: Insufficient documentation

## 2012-07-16 HISTORY — DX: Unspecified right bundle-branch block: I45.10

## 2012-07-16 HISTORY — DX: Atrioventricular block, first degree: I44.0

## 2012-07-16 LAB — GLUCOSE, CAPILLARY: Glucose-Capillary: 146 mg/dL — ABNORMAL HIGH (ref 70–99)

## 2012-07-16 MED ORDER — MORPHINE SULFATE 4 MG/ML IJ SOLN
4.0000 mg | Freq: Once | INTRAMUSCULAR | Status: AC
  Administered 2012-07-17: 4 mg via INTRAVENOUS
  Filled 2012-07-16: qty 1

## 2012-07-16 MED ORDER — ONDANSETRON HCL 4 MG/2ML IJ SOLN
4.0000 mg | Freq: Once | INTRAMUSCULAR | Status: AC
  Administered 2012-07-17: 4 mg via INTRAVENOUS
  Filled 2012-07-16: qty 2

## 2012-07-16 NOTE — ED Notes (Signed)
Patient called EMS complaining of chest pain radiation to her back.  Upon arrival EMS gave her 0.4 of SL Nitroglycerin, but she also had three on her own Nitro and four baby Aspirin.  Patient is still having chest pain, denies SOB, Dizziness however she was slightly diaphoretic.  EKG that was done showed new onset of right BBB with Afib.  According to family she has never been diagnosed with BBB.  Patient complains of pain with movement and relief with laying on the right side.  Patient was stable during transport and EKG done showed same as first.

## 2012-07-16 NOTE — ED Provider Notes (Addendum)
History     CSN: [id number]  Arrival date & time 07/16/12  2332   First MD Initiated Contact with Patient 07/16/12 2336      Chief Complaint  Patient presents with  . Chest Pain    (Consider location/radiation/quality/duration/timing/severity/associated sxs/prior treatment) Patient is a 78 y.o. female presenting with chest pain. The history is provided by the patient.  Chest Pain The chest pain began yesterday. The chest pain is resolved. The pain is associated with exertion. At its most intense, the pain is at 9/10. The pain is currently at 9/10. The severity of the pain is severe. The quality of the pain is described as aching. The pain radiates to the upper back. Chest pain is worsened by certain positions.     Past Medical History  Diagnosis Date  . Allergic rhinitis   . Anemia     NOS chronic disease. Hgb 11.6gm% 04/14/2009  . CAD (coronary artery disease)   . Diabetes mellitus type II   . Diverticulitis of colon   . GERD (gastroesophageal reflux disease)   . Hyperlipidemia   . HTN (hypertension)   . DM neuropathy, type II diabetes mellitus   . IBS (irritable bowel syndrome)   . Spinal stenosis     djd lumbar  . Multinodular goiter   . Orthostatic hypotension   . Obesity     BMI-37  . Acute renal failure     due to dehydration-Sept 2009; Normal creat 1.2 on fu 05/13/2009  . Hx of colonoscopy   . Angina   . Cancer     on nose and had it removed  . DEMENTIA     slight  . Osteoarthritis     lower back  . Dysrhythmia   . Pneumonia   . PAF (paroxysmal atrial fibrillation)     noted on event monitor. No correlation with episodes and symptoms  . Chronic anticoagulation   . Blood dyscrasia     chronic anticoagulant    Past Surgical History  Procedure Date  . Cholecystectomy   . Abdominal hysterectomy   . Nasal sinus surgery     x2  . Bilateral arthroscopic knee surgery   . Stent surgery   . Coronary angioplasty     stent  . Carpel tunnel wrist rt      Family History  Problem Relation Age of Onset  . Breast cancer Sister   . Heart disease Brother   . Colon cancer Neg Hx     History  Substance Use Topics  . Smoking status: Never Smoker   . Smokeless tobacco: Never Used  . Alcohol Use: No    OB History    Grav Para Term Preterm Abortions TAB SAB Ect Mult Living                  Review of Systems  Cardiovascular: Positive for chest pain.  All other systems reviewed and are negative.    Allergies  Amoxicillin  Home Medications   Current Outpatient Rx  Name  Route  Sig  Dispense  Refill  . ACETAMINOPHEN 500 MG PO TABS   Oral   Take 1,000 mg by mouth every 6 (six) hours as needed. FOR PAIN         . ALBUTEROL SULFATE HFA 108 (90 BASE) MCG/ACT IN AERS   Inhalation   Inhale 2 puffs into the lungs every 4 (four) hours as needed. SHORTNESS OF BREATH         . ALENDRONATE  SODIUM 70 MG PO TABS   Oral   Take 70 mg by mouth every 7 (seven) days. Take with a full glass of water on an empty stomach. TAKES ON SUNDAYS         . AMLODIPINE BESYLATE 2.5 MG PO TABS   Oral   Take 2.5 mg by mouth daily.           . ARTIFICIAL TEARS OP SOLN   Both Eyes   Place 1 drop into both eyes 3 (three) times daily as needed. 1 drop both eyes two times a day as needed FOR DRY EYES         . ASPIRIN 81 MG PO TBEC   Oral   Take 81 mg by mouth daily.           Marland Kitchen CALCIUM CARBONATE-VITAMIN D 600-400 MG-UNIT PO CHEW   Oral   Chew 1 tablet by mouth 2 (two) times daily.           . OMEGA-3 FATTY ACIDS 1000 MG PO CAPS   Oral   Take 2 g by mouth daily.           Marland Kitchen GABAPENTIN 300 MG PO CAPS   Oral   Take 600 mg by mouth 2 (two) times daily.          Marland Kitchen GLIPIZIDE ER 10 MG PO TB24   Oral   Take 10 mg by mouth daily.         Marland Kitchen HYDROCHLOROTHIAZIDE 25 MG PO TABS   Oral   Take 25 mg by mouth daily.           Marland Kitchen MAGNESIUM PO   Oral   Take 1 tablet by mouth every morning.         . ADULT MULTIVITAMIN W/MINERALS  CH   Oral   Take 1 tablet by mouth daily.         Marland Kitchen NITROGLYCERIN 0.4 MG SL SUBL   Sublingual   Place 0.4 mg under the tongue every 5 (five) minutes as needed. FOR CHEST PAIN         . ROSUVASTATIN CALCIUM 20 MG PO TABS   Oral   Take 20 mg by mouth daily.           Marland Kitchen VITAMIN B-1 PO   Oral   Take 1 tablet by mouth every morning.         Marland Kitchen TRAMADOL HCL 50 MG PO TABS   Oral   Take 50 mg by mouth daily.          . VENLAFAXINE HCL 37.5 MG PO TABS   Oral   Take 1 tablet (37.5 mg total) by mouth daily. Pt takes in the morning.         . WARFARIN SODIUM 5 MG PO TABS      TAKE AS DIRECTED BY THE ANTICOAGULATION CLINIC   50 tablet   3     BP 130/68  Pulse 80  Temp 97.7 F (36.5 C) (Oral)  Resp 18  SpO2 99%  Physical Exam  Constitutional: She is oriented to person, place, and time. She appears well-developed and well-nourished.  HENT:  Head: Normocephalic and atraumatic.  Eyes: Conjunctivae normal and EOM are normal. Pupils are equal, round, and reactive to light.  Neck: Normal range of motion.  Cardiovascular: Normal rate, regular rhythm and normal heart sounds.   Pulmonary/Chest: Effort normal and breath sounds normal. She exhibits tenderness.  Tenderness to palpation of left breast  Abdominal: Soft. Bowel sounds are normal.  Musculoskeletal: Normal range of motion.  Neurological: She is alert and oriented to person, place, and time.  Skin: Skin is warm and dry.  Psychiatric: She has a normal mood and affect. Her behavior is normal.    ED Course  Procedures (including critical care time)  Labs Reviewed - No data to display No results found.   No diagnosis found.   Date: 07/16/2012  Rate: 93  Rhythm: normal sinus rhythm  QRS Axis: right  Intervals: normal  ST/T Wave abnormalities: nonspecific ST changes  Conduction Disutrbances:right bundle branch block  Narrative Interpretation:   Old EKG Reviewed: unchanged   MDM  + chest pain.   Will labs,  Analgesia,  reassess        Rosanne Ashing, MD 07/16/12 [id number]  Rosanne Ashing, MD 07/16/12 [phone]

## 2012-07-17 ENCOUNTER — Emergency Department (HOSPITAL_COMMUNITY): Payer: Medicare Other

## 2012-07-17 ENCOUNTER — Encounter (HOSPITAL_COMMUNITY): Payer: Medicare Other

## 2012-07-17 ENCOUNTER — Observation Stay (HOSPITAL_COMMUNITY): Payer: Medicare Other

## 2012-07-17 ENCOUNTER — Encounter (HOSPITAL_COMMUNITY): Payer: Self-pay | Admitting: Emergency Medicine

## 2012-07-17 DIAGNOSIS — R079 Chest pain, unspecified: Secondary | ICD-10-CM

## 2012-07-17 LAB — CBC
Hemoglobin: 12.9 g/dL (ref 12.0–15.0)
MCH: 31.2 pg (ref 26.0–34.0)
MCV: 91.3 fL (ref 78.0–100.0)
Platelets: 194 10*3/uL (ref 150–400)
RBC: 4.13 MIL/uL (ref 3.87–5.11)
WBC: 7.5 10*3/uL (ref 4.0–10.5)

## 2012-07-17 LAB — COMPREHENSIVE METABOLIC PANEL
AST: 24 U/L (ref 0–37)
Albumin: 3.3 g/dL — ABNORMAL LOW (ref 3.5–5.2)
CO2: 30 mEq/L (ref 19–32)
Calcium: 9.7 mg/dL (ref 8.4–10.5)
Creatinine, Ser: 1.02 mg/dL (ref 0.50–1.10)
GFR calc non Af Amer: 48 mL/min — ABNORMAL LOW (ref >90)

## 2012-07-17 LAB — TROPONIN I: Troponin I: <0.3 ng/mL (ref <0.30)

## 2012-07-17 LAB — D-DIMER, QUANTITATIVE: D-Dimer, Quant: 0.8 ug/mL-FEU — ABNORMAL HIGH (ref 0.00–0.48)

## 2012-07-17 LAB — POCT I-STAT TROPONIN I: Troponin i, poc: 0 ng/mL (ref 0.00–0.08)

## 2012-07-17 LAB — PROTIME-INR: INR: 1.15 (ref 0.00–1.49)

## 2012-07-17 LAB — LIPASE, BLOOD: Lipase: 37 U/L (ref 11–59)

## 2012-07-17 LAB — MRSA PCR SCREENING: MRSA by PCR: NEGATIVE

## 2012-07-17 LAB — GLUCOSE, CAPILLARY: Glucose-Capillary: 114 mg/dL — ABNORMAL HIGH (ref 70–99)

## 2012-07-17 MED ORDER — WARFARIN SODIUM 10 MG PO TABS: 10.0000 mg | ORAL_TABLET | ORAL | Status: DC

## 2012-07-17 MED ORDER — GABAPENTIN 600 MG PO TABS
600.0000 mg | ORAL_TABLET | Freq: Two times a day (BID) | ORAL | Status: DC
  Administered 2012-07-17 – 2012-07-18 (×3): 600 mg via ORAL
  Filled 2012-07-17 (×4): qty 1

## 2012-07-17 MED ORDER — NITROGLYCERIN 0.4 MG SL SUBL: 0.4000 mg | SUBLINGUAL_TABLET | SUBLINGUAL | Status: DC | PRN

## 2012-07-17 MED ORDER — ACETAMINOPHEN 325 MG PO TABS: 650.0000 mg | ORAL_TABLET | Freq: Four times a day (QID) | ORAL | Status: DC | PRN

## 2012-07-17 MED ORDER — ONDANSETRON HCL 4 MG/2ML IJ SOLN
4.0000 mg | Freq: Four times a day (QID) | INTRAMUSCULAR | Status: DC | PRN
  Administered 2012-07-17: 4 mg via INTRAVENOUS
  Filled 2012-07-17: qty 2

## 2012-07-17 MED ORDER — INSULIN ASPART 100 UNIT/ML ~~LOC~~ SOLN: 0.0000 [IU] | SUBCUTANEOUS | Status: DC

## 2012-07-17 MED ORDER — MORPHINE SULFATE 2 MG/ML IJ SOLN
2.0000 mg | INTRAMUSCULAR | Status: DC | PRN
  Administered 2012-07-17: 2 mg via INTRAVENOUS
  Filled 2012-07-17 (×2): qty 1

## 2012-07-17 MED ORDER — SIMETHICONE 80 MG PO CHEW
80.0000 mg | CHEWABLE_TABLET | Freq: Four times a day (QID) | ORAL | Status: DC | PRN
  Filled 2012-07-17: qty 1

## 2012-07-17 MED ORDER — ATORVASTATIN CALCIUM 80 MG PO TABS
80.0000 mg | ORAL_TABLET | Freq: Every day | ORAL | Status: DC
  Administered 2012-07-17: 80 mg via ORAL
  Filled 2012-07-17 (×2): qty 1

## 2012-07-17 MED ORDER — REGADENOSON 0.4 MG/5ML IV SOLN
INTRAVENOUS | Status: AC
  Filled 2012-07-17: qty 5

## 2012-07-17 MED ORDER — REGADENOSON 0.4 MG/5ML IV SOLN
0.4000 mg | Freq: Once | INTRAVENOUS | Status: AC
  Administered 2012-07-17: 0.4 mg via INTRAVENOUS
  Filled 2012-07-17: qty 5

## 2012-07-17 MED ORDER — HEPARIN SODIUM (PORCINE) 5000 UNIT/ML IJ SOLN
5000.0000 [IU] | Freq: Three times a day (TID) | INTRAMUSCULAR | Status: DC
  Administered 2012-07-17: 5000 [IU] via SUBCUTANEOUS
  Filled 2012-07-17 (×7): qty 1

## 2012-07-17 MED ORDER — ASPIRIN EC 81 MG PO TBEC
81.0000 mg | DELAYED_RELEASE_TABLET | Freq: Every day | ORAL | Status: DC
  Administered 2012-07-17 – 2012-07-18 (×2): 81 mg via ORAL
  Filled 2012-07-17 (×2): qty 1

## 2012-07-17 MED ORDER — ADULT MULTIVITAMIN W/MINERALS CH
1.0000 | ORAL_TABLET | Freq: Every day | ORAL | Status: DC
  Administered 2012-07-17 – 2012-07-18 (×2): 1 via ORAL
  Filled 2012-07-17 (×2): qty 1

## 2012-07-17 MED ORDER — ACETAMINOPHEN 325 MG PO TABS: 650.0000 mg | ORAL_TABLET | ORAL | Status: DC | PRN

## 2012-07-17 MED ORDER — WARFARIN - PHYSICIAN DOSING INPATIENT: Freq: Every day | Status: DC

## 2012-07-17 MED ORDER — PANTOPRAZOLE SODIUM 40 MG PO TBEC
40.0000 mg | DELAYED_RELEASE_TABLET | Freq: Every day | ORAL | Status: DC
  Administered 2012-07-17 – 2012-07-18 (×2): 40 mg via ORAL
  Filled 2012-07-17 (×2): qty 1

## 2012-07-17 MED ORDER — CALCIUM CARBONATE-VITAMIN D 500-200 MG-UNIT PO TABS
1.0000 | ORAL_TABLET | Freq: Two times a day (BID) | ORAL | Status: DC
  Administered 2012-07-17 – 2012-07-18 (×3): 1 via ORAL
  Filled 2012-07-17 (×4): qty 1

## 2012-07-17 MED ORDER — IOHEXOL 350 MG/ML SOLN
150.0000 mL | Freq: Once | INTRAVENOUS | Status: AC | PRN
  Administered 2012-07-17: 100 mL via INTRAVENOUS

## 2012-07-17 MED ORDER — WARFARIN SODIUM 7.5 MG PO TABS: 7.5000 mg | ORAL_TABLET | Freq: Every day | ORAL | Status: DC

## 2012-07-17 MED ORDER — TECHNETIUM TC 99M SESTAMIBI GENERIC - CARDIOLITE
10.0000 | Freq: Once | INTRAVENOUS | Status: AC | PRN
  Administered 2012-07-17: 10 via INTRAVENOUS

## 2012-07-17 MED ORDER — AMLODIPINE BESYLATE 2.5 MG PO TABS
2.5000 mg | ORAL_TABLET | Freq: Every day | ORAL | Status: DC
  Administered 2012-07-17 – 2012-07-18 (×2): 2.5 mg via ORAL
  Filled 2012-07-17 (×2): qty 1

## 2012-07-17 MED ORDER — MORPHINE SULFATE 2 MG/ML IJ SOLN
2.0000 mg | Freq: Once | INTRAMUSCULAR | Status: AC
  Administered 2012-07-17: 2 mg via INTRAVENOUS

## 2012-07-17 MED ORDER — WARFARIN SODIUM 7.5 MG PO TABS
7.5000 mg | ORAL_TABLET | ORAL | Status: DC
  Filled 2012-07-17: qty 1

## 2012-07-17 MED ORDER — TECHNETIUM TC 99M SESTAMIBI GENERIC - CARDIOLITE
30.0000 | Freq: Once | INTRAVENOUS | Status: AC | PRN
  Administered 2012-07-17: 30 via INTRAVENOUS

## 2012-07-17 MED ORDER — WARFARIN SODIUM 10 MG PO TABS
10.0000 mg | ORAL_TABLET | Freq: Once | ORAL | Status: AC
  Administered 2012-07-17: 10 mg via ORAL
  Filled 2012-07-17: qty 1

## 2012-07-17 MED ORDER — GI COCKTAIL ~~LOC~~
30.0000 mL | Freq: Three times a day (TID) | ORAL | Status: DC | PRN
  Administered 2012-07-17: 30 mL via ORAL
  Filled 2012-07-17: qty 30

## 2012-07-17 NOTE — Progress Notes (Signed)
Lexiscan Myoview performed.  [name] [name] 07/17/2012 1:38 PM

## 2012-07-17 NOTE — ED Notes (Signed)
Patient is back from CT.

## 2012-07-17 NOTE — Progress Notes (Addendum)
Patient: [name] / Admit Date: 07/16/2012 / Date of Encounter: 07/17/2012, 9:24 AM   Subjective  Continues to have ongoing chest pain that comes on like a squeezing sensation. Also has had some pain in her back reproducible with palpation. Chest pain does not come on with palpation. She sat up in bed and felt increased pain with this movement. No SOB, nausea, diaphoresis, or any other symptoms.   Objective   Telemetry: NSR with 1st degree AVB - has also had borderline tachycardia with HR ~100 ?atrial tach Physical Exam: Filed Vitals:   07/17/12 0636  BP: 135/83  Pulse: 83  Temp: 97.6 F (36.4 C)  Resp: 18   General: Well developed, well nourished WF.  She was asleep when I entered and comfortable but began having "severe pain" when I woke her.  At the end of the interview stopped and smiled comfortably. Head: Normocephalic, atraumatic, sclera non-icteric, no xanthomas, nares are without discharge. Neck: JVD not elevated. Lungs: Clear bilaterally to auscultation without wheezes, rales, or rhonchi. Breathing is unlabored. Heart: RRR S1 S2 without murmurs, rubs, or gallops.  Abdomen: Soft, non-distended with normoactive bowel sounds. No hepatomegaly. No rebound/guarding. No obvious abdominal masses.  + ttp over the L epigastric region which reproduces her pain Msk:  Strength and tone appear normal for age. Extremities: No clubbing or cyanosis. Tr sockline edema.  Distal pedal pulses are 2+ and equal bilaterally. Neuro: Alert and oriented X 3. Moves all extremities spontaneously. Psych:  Responds to questions appropriately with a normal affect.   No intake or output data in the 24 hours ending 07/17/12 0924  Inpatient Medications:    . amLODipine  2.5 mg Oral Daily  . aspirin EC  81 mg Oral Daily  . atorvastatin  80 mg Oral q1800  . calcium-vitamin D  1 tablet Oral BID  . gabapentin  600 mg Oral BID  . heparin  5,000 Units Subcutaneous Q8H  . insulin aspart  0-15 Units  Subcutaneous Q4H  . multivitamin with minerals  1 tablet Oral Daily  . pantoprazole  40 mg Oral Daily  . warfarin  10 mg Oral Q Mon-1800  . warfarin  7.5 mg Oral Custom  . Warfarin - Physician Dosing Inpatient   Does not apply q1800    Labs:  [facility] 07/17/12 0021  NA 141  K 4.1  CL 102  CO2 30  GLUCOSE 113*  BUN 21  CREATININE 1.02  CALCIUM 9.7  MG --  PHOS --    Basename 07/17/12 0021  AST 24  ALT 20  ALKPHOS 46  BILITOT 0.3  PROT 6.4  ALBUMIN 3.3*    Basename 07/17/12 0021  WBC 7.5  NEUTROABS --  HGB 12.9  HCT 37.7  MCV 91.3  PLT 194   Radiology/Studies:  Ct Angio Chest W/cm &/or Wo Cm 07/17/2012  *RADIOLOGY REPORT*  Clinical Data: Lambert Mody right-sided chest pain, worse with deep inspiration and movement.  CT ANGIOGRAPHY CHEST  Technique:  Multidetector CT imaging of the chest using the standard protocol during bolus administration of intravenous contrast. Multiplanar reconstructed images including MIPs were obtained and reviewed to evaluate the vascular anatomy.  Contrast: OMNIPAQUE IOHEXOL 350 MG/ML SOLN  Comparison: Chest radiograph performed earlier today at 12:19 a.m.  Findings: There is no evidence of pulmonary embolus.  Mild scattered bilateral atelectasis is noted.  The lungs are otherwise clear.  There is no evidence of significant focal consolidation, pleural effusion or pneumothorax.  No masses are identified;  no abnormal focal contrast enhancement is seen.  Scattered coronary artery calcifications are seen.  The mediastinum is otherwise unremarkable in appearance.  No mediastinal lymphadenopathy is seen.  No pericardial effusion is identified. The great vessels are grossly unremarkable.  Mild scattered calcification is noted along the aortic arch  No axillary lymphadenopathy is seen.  A small focal calcification is noted within the right thyroid lobe; the thyroid gland is otherwise unremarkable.  The visualized portions of the liver and spleen are  unremarkable. The patient is status post cholecystectomy, with clips noted at the gallbladder fossa.  The visualized portions of the pancreas, stomach, adrenal glands and left kidney are unremarkable.  No acute osseous abnormalities are seen.  Anterior and lateral osteophytes are noted along the lower thoracic spine.  IMPRESSION:  1.  No evidence of pulmonary embolus. 2.  Mild scattered bilateral atelectasis noted; lungs otherwise clear. 3.  Scattered coronary artery calcifications seen.   Original Report Authenticated By: Tonia Ghent, M.D.    Dg Chest Port 1 View  07/17/2012  *RADIOLOGY REPORT*  Clinical Data: Chest pain and back pain over the past 2 hours.  PORTABLE CHEST - 1 VIEW 07/17/2012 0019 hours:  Comparison: Portable chest x-ray 11/19/2011.  Two-view chest x-ray 06/07/2011, 09/26/2010.  Findings: Suboptimal inspiration which accounts for atelectasis at the bases and accentuates the cardiac silhouette.  Taking this into account, cardiac silhouette upper normal in size and stable. Thoracic aorta tortuous and atherosclerotic but unchanged in appearance.  Hilar and mediastinal contours otherwise unremarkable. Lungs otherwise clear.  IMPRESSION: Suboptimal inspiration accounts for bibasilar atelectasis.  No acute cardiopulmonary disease otherwise.   Original Report Authenticated By: Hulan Saas, M.D.      Assessment and Plan   1. Chest pain/abd pain/back pain - atypical features but history of CAD. Will repeat EKG and request enzymes to be done now. Give 2mg  IV morphine. Unclear if GI cocktail gave any relief. Will d/w further w/u with Dr. Johney Frame. 2. PAF - currently maintaing NSR. On Coumadin as OP but INR 1.15 on admission (2.2 on 12/20). Will repeat INR this morning to verify. Couamdin per pharm. MD to review tele given brief elevated HR with some irregularity but notable P waves. 3. H/o CAD s/p DES mLAD 2005, CB angioplasty due to ISR 2006, last cath 08/2010 with nonobstructive disease.  Continue ASA, statin. Not currently on any BB therapy, likely due to long 1st degree AV block/RBBB. See above. 4. Diabetes mellitus - SSI. 5. HTN - controlled.  6. Hyperlipidemia - continue statin. 7. H/o GERD/IBS  Signed, Dayna Dunn PA-C  I have seen, examined the patient, and reviewed the above assessment and plan.  Changes to above are made where necessary.  I agree that her pain is atypical.  Seems to be more localized over her abdomen and worse with manipulation.  EKG is without ischemic changes.  I agree that with her history and known CAD that myoview is prudent.  If low risk then we will manage medically and consider noncardiac causes for her symptoms.  CTA is reviewed.  Co Sign: Hillis Range, MD 07/17/2012 10:58 AM  Addendum:  Pt now doing very well.  Her symptoms have completely resolved (she says better after eating).  Denies CP or SOB and wants to go home soon. Myoview reveals scar but no ischemia.  EKGs are extensively reviewed.  She has sinus with first degree AV block and RBBB.  No ischemic changes when compared to old ekgs.  I dont appreciate any  new ST segment changes.  Will plan to discharge to home in am if no problems overnight.  Medical management long term with follow-up by Dr Tenny Craw.  Fayrene Fearing Pearl Bents,MD

## 2012-07-17 NOTE — ED Notes (Signed)
Family at bedside. 

## 2012-07-17 NOTE — Progress Notes (Signed)
Pharmacy - Coumadin  Ms. Macwilliams may go home today  Admit INR = 1.15  Plan: 1) Recommend 10 mg today and tomorrow then resume home dose of 7.5 mg daily with 10 mg on Monday.  2) Recommend follow up INR Friday if possible.  Thank you.  Okey Regal, PharmD [phone]

## 2012-07-17 NOTE — ED Notes (Signed)
Patient transported to X-ray 

## 2012-07-17 NOTE — Care Management Note (Unsigned)
    Page 1 of 1   07/17/2012     3:57:21 PM   CARE MANAGEMENT NOTE 07/17/2012  Patient:  [name], [name]   Account Number:  [id number]  Date Initiated:  07/17/2012  Documentation initiated by:  GRAVES-BIGELOW,Osman Calzadilla  Subjective/Objective Assessment:   Pt admitted with cp.     Action/Plan:   Will f/u for  any disposition needs.   Anticipated DC Date:  07/18/2012   Anticipated DC Plan:  HOME/SELF CARE      DC Planning Services  CM consult      Choice offered to / List presented to:             Status of service:  In process, will continue to follow Medicare Important Message given?   (If response is "NO", the following Medicare IM given date fields will be blank) Date Medicare IM given:   Date Additional Medicare IM given:    Discharge Disposition:    Per UR Regulation:  Reviewed for med. necessity/level of care/duration of stay  If discussed at Long Length of Stay Meetings, dates discussed:    Comments:

## 2012-07-17 NOTE — Progress Notes (Signed)
UR Completed Giavonna Pflum Graves-Bigelow, RN,BSN [phone]  

## 2012-07-17 NOTE — H&P (Signed)
Physician History and Physical    [name] MRN: [id number] DOB/AGE: May 29, 1924 78 y.o. Admit date: 07/16/2012   Primary Cardiologist:  Dr. Dietrich Pates  CC:  Chest pain/abd pain/back pain  HPI:  Pt is an 78 yo woman with CAD, DM, HTN, dementia, pAF who presents with chest pain/abd pain/back pain.  Pt has been having the same constellation of sx for 'a while' with prior admissions to [facility] for the same.  She ate dinner at 8 pm and then at 10 pm she couldn't get comfortable in bed, had the acute onset of sharp epigastric pain and chest pain that radiated to her back.  She burped.  She denies any SOB, n, v, diaphoresis.  The pain is intermittent.  She does not have any chest pain currently, but she does have back pain.  She did not do anything out of the ordinary yesterday to provoke this pain episode.  She has no other acute complaints.  She came to the hospital because she was worried she may have had a blood clot.   Review of systems: A review of 10 organ systems was done and is negative except as stated above in HPI  Past Medical History  Diagnosis Date  . Allergic rhinitis   . Anemia     NOS chronic disease. Hgb 11.6gm% 04/14/2009  . CAD (coronary artery disease)   . Diabetes mellitus type II   . Diverticulitis of colon   . GERD (gastroesophageal reflux disease)   . Hyperlipidemia   . HTN (hypertension)   . DM neuropathy, type II diabetes mellitus   . IBS (irritable bowel syndrome)   . Spinal stenosis     djd lumbar  . Multinodular goiter   . Orthostatic hypotension   . Obesity     BMI-37  . Acute renal failure     due to dehydration-Sept 2009; Normal creat 1.2 on fu 05/13/2009  . Hx of colonoscopy   . Angina   . Cancer     on nose and had it removed  . DEMENTIA     slight  . Osteoarthritis     lower back  . Dysrhythmia   . Pneumonia   . PAF (paroxysmal atrial fibrillation)     noted on event monitor. No correlation with episodes and symptoms  . Chronic  anticoagulation   . Blood dyscrasia     chronic anticoagulant   Past Surgical History  Procedure Date  . Cholecystectomy   . Abdominal hysterectomy   . Nasal sinus surgery     x2  . Bilateral arthroscopic knee surgery   . Stent surgery   . Coronary angioplasty     stent  . Carpel tunnel wrist rt    History   Social History  . Marital Status: Married    Spouse Name: N/A    Number of Children: N/A  . Years of Education: N/A   Occupational History  . retired    Social History Main Topics  . Smoking status: Never Smoker   . Smokeless tobacco: Never Used  . Alcohol Use: No  . Drug Use: No  . Sexually Active: No   Other Topics Concern  . Not on file   Social History Narrative  . No narrative on file    Family History  Problem Relation Age of Onset  . Breast cancer Sister   . Heart disease Brother   . Colon cancer Neg Hx  Allergies  Allergen Reactions  . Amoxicillin Other (See Comments)    UNKNOWN     (Not in a hospital admission)  No current facility-administered medications for this encounter. Current outpatient prescriptions:amLODipine (NORVASC) 2.5 MG tablet, Take 2.5 mg by mouth daily.  , Disp: , Rfl: ;  aspirin 81 MG chewable tablet, Chew 324 mg by mouth once., Disp: , Rfl: ;  aspirin 81 MG EC tablet, Take 81 mg by mouth daily.  , Disp: , Rfl: ;  Calcium Carbonate-Vitamin D (CALTRATE 600+D) 600-400 MG-UNIT per chew tablet, Chew 1 tablet by mouth 2 (two) times daily.  , Disp: , Rfl:  gabapentin (NEURONTIN) 300 MG capsule, Take 600 mg by mouth 2 (two) times daily. , Disp: , Rfl: ;  glipiZIDE (GLUCOTROL XL) 10 MG 24 hr tablet, Take 10 mg by mouth daily., Disp: , Rfl: ;  Multiple Vitamin (MULITIVITAMIN WITH MINERALS) TABS, Take 1 tablet by mouth daily., Disp: , Rfl: ;  rosuvastatin (CRESTOR) 20 MG tablet, Take 20 mg by mouth daily.  , Disp: , Rfl:  warfarin (COUMADIN) 5 MG tablet, Take 7.5-10 mg by mouth daily. 7.5 mg on all days except Monday. On Mondays,  patient takes 10 mg., Disp: , Rfl: ;  nitroGLYCERIN (NITROSTAT) 0.4 MG SL tablet, Place 0.4 mg under the tongue every 5 (five) minutes as needed. FOR CHEST PAIN, Disp: , Rfl:   Physical Exam: Blood pressure 134/74, pulse 86, temperature 97.7 F (36.5 C), temperature source Oral, resp. rate 18, SpO2 91.00%.; There is no height or weight on file to calculate BMI. Temp:  [97.7 F (36.5 C)] 97.7 F (36.5 C) (12/25 2338) Pulse Rate:  [38-100] 86  (12/26 0315) Resp:  [18-23] 18  (12/26 0315) BP: (94-142)/(68-110) 134/74 mmHg (12/26 0130) SpO2:  [86 %-99 %] 91 % (12/26 0315)  No intake or output data in the 24 hours ending 07/17/12 0437 General: NAD Heent: MMM Neck: No JVD  CV: RRR, no m, +chest wall tenderness to light palpation over sternum Lungs: Clear to auscultation bilaterally ant with normal respiratory effort Abdomen: Soft, mild tenderness to palpation diffusely Extremities: No clubbing or cyanosis.  Trace pedal edema Skin: Intact without lesions or rashes  Neurologic: Alert and oriented, grossly nonfocal  Psych: Normal mood and affect    Labs: No results found for this basename: CKTOTAL:4,CKMB:4,TROPONINI:4 in the last 72 hours Lab Results  Component Value Date   WBC 7.5 07/17/2012   HGB 12.9 07/17/2012   HCT 37.7 07/17/2012   MCV 91.3 07/17/2012   PLT 194 07/17/2012    Lab 07/17/12 0021  NA 141  K 4.1  CL 102  CO2 30  BUN 21  CREATININE 1.02  CALCIUM 9.7  PROT 6.4  BILITOT 0.3  ALKPHOS 46  ALT 20  AST 24  GLUCOSE 113*   Lab Results  Component Value Date   CHOL 123 06/08/2011   HDL 57 06/08/2011   LDLCALC 47 06/08/2011   TRIG 97 06/08/2011    D dimer 0.80 Trop 0.00  INR 1.14  ZOX:WRUEA, prolonged PR, RBBB  Echo: 5/13  Impressions:  - The patient was in atrial fibrillation. Normal LV size and systolic function with mild LV hypertrophy. EF 55%. Mild biatrial enlargement. Normal RV size and systolic function. Mild aortic  insufficiency.  Radiology:  Ct Angio Chest W/cm &/or Wo Cm  07/17/2012  *RADIOLOGY REPORT*  Clinical Data: Lambert Mody right-sided chest pain, worse with deep inspiration and movement.  CT ANGIOGRAPHY CHEST  Technique:  Multidetector  CT imaging of the chest using the standard protocol during bolus administration of intravenous contrast. Multiplanar reconstructed images including MIPs were obtained and reviewed to evaluate the vascular anatomy.  Contrast: OMNIPAQUE IOHEXOL 350 MG/ML SOLN  Comparison: Chest radiograph performed earlier today at 12:19 a.m.  Findings: There is no evidence of pulmonary embolus.  Mild scattered bilateral atelectasis is noted.  The lungs are otherwise clear.  There is no evidence of significant focal consolidation, pleural effusion or pneumothorax.  No masses are identified; no abnormal focal contrast enhancement is seen.  Scattered coronary artery calcifications are seen.  The mediastinum is otherwise unremarkable in appearance.  No mediastinal lymphadenopathy is seen.  No pericardial effusion is identified. The great vessels are grossly unremarkable.  Mild scattered calcification is noted along the aortic arch  No axillary lymphadenopathy is seen.  A small focal calcification is noted within the right thyroid lobe; the thyroid gland is otherwise unremarkable.  The visualized portions of the liver and spleen are unremarkable. The patient is status post cholecystectomy, with clips noted at the gallbladder fossa.  The visualized portions of the pancreas, stomach, adrenal glands and left kidney are unremarkable.  No acute osseous abnormalities are seen.  Anterior and lateral osteophytes are noted along the lower thoracic spine.  IMPRESSION:  1.  No evidence of pulmonary embolus. 2.  Mild scattered bilateral atelectasis noted; lungs otherwise clear. 3.  Scattered coronary artery calcifications seen.   Original Report Authenticated By: Tonia Ghent, M.D.    Dg Chest Port 1  View  07/17/2012  *RADIOLOGY REPORT*  Clinical Data: Chest pain and back pain over the past 2 hours.  PORTABLE CHEST - 1 VIEW 07/17/2012 0019 hours:  Comparison: Portable chest x-ray 11/19/2011.  Two-view chest x-ray 06/07/2011, 09/26/2010.  Findings: Suboptimal inspiration which accounts for atelectasis at the bases and accentuates the cardiac silhouette.  Taking this into account, cardiac silhouette upper normal in size and stable. Thoracic aorta tortuous and atherosclerotic but unchanged in appearance.  Hilar and mediastinal contours otherwise unremarkable. Lungs otherwise clear.  IMPRESSION: Suboptimal inspiration accounts for bibasilar atelectasis.  No acute cardiopulmonary disease otherwise.   Original Report Authenticated By: Hulan Saas, M.D.     ASSESSMENT:  Pt is an 78 yo woman with CAD, DM, HTN, dementia, pAF who presents with chest pain/abd pain/back pain  PLAN: Chest pain/abd pain/ back pain - pt with admission for similar sx in past.  ddx inlcudes ACS (atypical, EKG with no ischemia, enzymes negative), GI source (gas pain, GERD (she has hx of GERD and IBS, but not on any medications for either), dissection (not hypertensive), MSK pain (pt's chest wall is tender to palpation on exam), PE (INR subtherapeutic, but CTA negative in ED) - serial cardiac enzymes and EKGs - consider stress test in am to further w/u possible cardiac cause, though this is very atypical for cardiac pain, seems more GI or MSK in nature at this time - try GI cocktail, simethicone for pain.  If no relief, tylenol or morphine prn pain.  Start PPI  pAF - EKG sinus today.  INR subtherapeutic.  Continue coumadin - pt may need adjustment in her dosing, will d/w pharmacy in am.  Pt is currently in sinus, will not bridge with heparin.  Dr. Tenny Craw' last clinic note discusses possibly putting pt on xarelto if insurance is amenable - this could be considered this admission given obvious compliance issues with subtherapeutic INR  today  CAD by hx - pt reports she has not  had a recent eval.  +cor ca2++ on CTA today.  Continue aspirin, statin  DM - hold PO meds.  SSI  HTN  - controlled. continue home meds  HLD - check lipids in am.  Continue statin  Dispo - admit to [facility] for rule out MI.  Heparin for DVT ppx given subtherapeutic INR.  NPO for any possible testing in am  Signed: Hilary Hertz, MD Cardiology Fellow 07/17/2012, 4:37 AM

## 2012-07-17 NOTE — Progress Notes (Signed)
ANTICOAGULATION CONSULT NOTE - Initial Consult  Pharmacy Consult for Coumadin Indication: atrial fibrillation  Allergies  Allergen Reactions  . Amoxicillin Other (See Comments)    UNKNOWN   Labs:  Basename 07/17/12 0438 07/17/12 0021  HGB -- 12.9  HCT -- 37.7  PLT -- 194  APTT -- --  LABPROT 14.5 --  INR 1.15 --  HEPARINUNFRC -- --  CREATININE -- 1.02  CKTOTAL -- --  CKMB -- --  TROPONINI -- --    Estimated Creatinine Clearance: 39.4 ml/min (by C-G formula based on Cr of 1.02).   Medical History: Past Medical History  Diagnosis Date  . Allergic rhinitis   . Anemia     NOS chronic disease. Hgb 11.6gm% 04/14/2009  . CAD (coronary artery disease)   . Diabetes mellitus type II   . Diverticulitis of colon   . GERD (gastroesophageal reflux disease)   . Hyperlipidemia   . HTN (hypertension)   . DM neuropathy, type II diabetes mellitus   . IBS (irritable bowel syndrome)   . Spinal stenosis     djd lumbar  . Multinodular goiter   . Orthostatic hypotension   . Obesity     BMI-37  . Acute renal failure     due to dehydration-Sept 2009; Normal creat 1.2 on fu 05/13/2009  . Hx of colonoscopy   . Angina   . Cancer     on nose and had it removed  . DEMENTIA     slight  . Osteoarthritis     lower back  . Dysrhythmia   . Pneumonia   . PAF (paroxysmal atrial fibrillation)     noted on event monitor. No correlation with episodes and symptoms  . Chronic anticoagulation   . Blood dyscrasia     chronic anticoagulant   Assessment: 78 year old on Coumadin PTA.  INR=1.15 on admission (?compliance)  Dose PTA = 7.5 mg daily except 10 mg on Mondays  Goal of Therapy:  INR 2-3 Monitor platelets by anticoagulation protocol: Yes   Plan:  1) Coumadin 10 mg po x 1 dose today 2) Daily INR  Thank you. Okey Regal, PharmD [phone]  07/17/2012,10:02 AM

## 2012-07-18 ENCOUNTER — Encounter (HOSPITAL_COMMUNITY): Payer: Self-pay | Admitting: Physician Assistant

## 2012-07-18 DIAGNOSIS — I2510 Atherosclerotic heart disease of native coronary artery without angina pectoris: Secondary | ICD-10-CM

## 2012-07-18 DIAGNOSIS — I4891 Unspecified atrial fibrillation: Secondary | ICD-10-CM

## 2012-07-18 DIAGNOSIS — R079 Chest pain, unspecified: Secondary | ICD-10-CM

## 2012-07-18 LAB — BASIC METABOLIC PANEL
Calcium: 9 mg/dL (ref 8.4–10.5)
Chloride: 103 mEq/L (ref 96–112)
Creatinine, Ser: 1.09 mg/dL (ref 0.50–1.10)
GFR calc Af Amer: 52 mL/min — ABNORMAL LOW (ref >90)

## 2012-07-18 LAB — LIPID PANEL
Total CHOL/HDL Ratio: 1.9 RATIO
VLDL: 15 mg/dL (ref 0–40)

## 2012-07-18 LAB — GLUCOSE, CAPILLARY: Glucose-Capillary: 112 mg/dL — ABNORMAL HIGH (ref 70–99)

## 2012-07-18 LAB — CBC
MCV: 92.7 fL (ref 78.0–100.0)
Platelets: 176 10*3/uL (ref 150–400)
RDW: 13.7 % (ref 11.5–15.5)
WBC: 6.5 10*3/uL (ref 4.0–10.5)

## 2012-07-18 LAB — PROTIME-INR: Prothrombin Time: 27.9 seconds — ABNORMAL HIGH (ref 11.6–15.2)

## 2012-07-18 MED ORDER — OMEPRAZOLE 20 MG PO CPDR: 20.0000 mg | DELAYED_RELEASE_CAPSULE | Freq: Every day | ORAL | Status: DC

## 2012-07-18 MED ORDER — NITROGLYCERIN 0.4 MG SL SUBL: 0.4000 mg | SUBLINGUAL_TABLET | SUBLINGUAL | Status: DC | PRN

## 2012-07-18 NOTE — Discharge Summary (Signed)
Discharge Summary   Patient ID: [name] MRN: [id number], DOB/AGE: 1923-12-22 78 y.o. Admit date: 07/16/2012 D/C date:     07/18/2012  Primary Cardiologist: Burney Calzadilla  Primary Discharge Diagnoses:  1. Chest pain, question GI versus musculoskeletal  - ruled out for MI, CT angio negative for PE  - Myoview 07/17/12 with small anteroapical wall infarct and moderate inferolateral wall infarct, EF 75% - PPI added 2. CAD  - s/p DES mLAD in 2005  - s/p subsequent cutting balloon angioplasty due to ISR 2006  - last LHC in 2/12 demonstrated LAD 40%, patent stent, prox and mid RCA 30% and EF 60% 3. HTN  4. PAF, maintaining SR 5. GERD 6. RBBB 7. 1st degree AVB  Secondary Discharge Diagnoses:  1. DM II with neuropathy  2. Dementia, slight  3. H/o Anemia NOS / chronic disease  4. Allergic rhinitis  5. Diverticulitis of colon 6. IBS  7. Spinal stenosis  8. Multinodular goiter  9. Obesity  10. Orthostatic hypotension  11. Acute renal failure 2009 due to dehydration  12. Cancer on nose, s/p removal  13. Osteoarthritis  Hospital Course:[name] is an 78 y/o F with history of HTN, CAD, PAF, GERD, slight dementia who presented to [facility] with complaints of chest pain/abd pain/back pain. She has been having the same constellation of sx for 'a while' with prior admissions to [facility] for the same. Last [facility] 08/2010 demonstrated LAD 40%, patent stent, prox and mid RCA 30% and EF 60%. The evening prior to admission, she ate dinner at 8pm and around 10pm couldn't get comfortable in bed. She had sharp epigastric pain and chest pain that went to her back. She burped. She denied any SOB, nausea, vomiting or diaphoresis. Initial troponin was negative. D-dimer was mildly elevated (h/o chronically elevated d-dimer) - CT angio showed no PE or acute mediastinal abnormalities. It did show coronary artery calcifications. EKG was nonacute. She received a GI cocktail and morphine around the same  time overnight with some relief. She continued to have symptoms this morning again without acute EKG changes compared to prior. There was some question of ST elevation inferiorly but this was felt related to her RBBB morphology as this was seen on prior tracings as well. Troponins remained negative. She is not on BB in light of first degree AVB and RBBB. She did have what appeared to be a brief nonsustained atrial tach on telemetry rates ~100 but no clear association to symptoms as she was in NSR at the time of her recurrent pain. Her pain improved with morphine. She underwent nuclear stress testing that showed small anteroapical wall infarct and moderate inferolateral wall infarct at base but no ischemia. Her symptoms were felt atypical. 3rd troponin remained negative despite fairly prolonged period of pain. She was observed during the evening for recurrent pain. Post-Myoview yesterday, she had no further discomfort or symptoms. Today she is asymptomatic. She has ambulated without symptoms. Dr. Johney Frame has seen and examined her today and feels she is stable for discharge. PPI will be continued at discharge.  Of note, her INR yesterday was 1.15 but question spurious value given that it was recently 2.2 as an outpatient, and 2.77 today. Will continue home Coumadin dose but arrange closer follow-up to ensure stability.   Discharge Vitals: Blood pressure 123/63, pulse 78, temperature 97.9 F (36.6 C), temperature source Oral, resp. rate 18, height 5\' 1"  (1.549 m), weight 189 lb 3.2 oz (85.821 kg), SpO2 94.00%.  Labs:  Lab Results  Component Value Date   WBC 6.5 07/18/2012   HGB 12.8 07/18/2012   HCT 38.0 07/18/2012   MCV 92.7 07/18/2012   PLT 176 07/18/2012     Lab 07/18/12 0620 07/17/12 0021  NA 139 --  K 4.3 --  CL 103 --  CO2 29 --  BUN 18 --  CREATININE 1.09 --  CALCIUM 9.0 --  PROT -- 6.4  BILITOT -- 0.3  ALKPHOS -- 46  ALT -- 20  AST -- 24  GLUCOSE 117* --    Basename 07/17/12 1812  07/17/12 1122 07/17/12 0830  CKTOTAL -- -- --  CKMB -- -- --  TROPONINI <0.30 <0.30 <0.30   Lab Results  Component Value Date   CHOL 126 07/18/2012   HDL 66 07/18/2012   LDLCALC 45 07/18/2012   TRIG 77 07/18/2012   Lab Results  Component Value Date   DDIMER 0.80* 07/17/2012    Diagnostic Studies/Procedures   Ct Angio Chest W/cm &/or Wo Cm 07/17/2012  *RADIOLOGY REPORT*  Clinical Data: Sharp right-sided chest pain, worse with deep inspiration and movement.  CT ANGIOGRAPHY CHEST  Technique:  Multidetector CT imaging of the chest using the standard protocol during bolus administration of intravenous contrast. Multiplanar reconstructed images including MIPs were obtained and reviewed to evaluate the vascular anatomy.  Contrast: OMNIPAQUE IOHEXOL 350 MG/ML SOLN  Comparison: Chest radiograph performed earlier today at 12:19 a.m.  Findings: There is no evidence of pulmonary embolus.  Mild scattered bilateral atelectasis is noted.  The lungs are otherwise clear.  There is no evidence of significant focal consolidation, pleural effusion or pneumothorax.  No masses are identified; no abnormal focal contrast enhancement is seen.  Scattered coronary artery calcifications are seen.  The mediastinum is otherwise unremarkable in appearance.  No mediastinal lymphadenopathy is seen.  No pericardial effusion is identified. The great vessels are grossly unremarkable.  Mild scattered calcification is noted along the aortic arch  No axillary lymphadenopathy is seen.  A small focal calcification is noted within the right thyroid lobe; the thyroid gland is otherwise unremarkable.  The visualized portions of the liver and spleen are unremarkable. The patient is status post cholecystectomy, with clips noted at the gallbladder fossa.  The visualized portions of the pancreas, stomach, adrenal glands and left kidney are unremarkable.  No acute osseous abnormalities are seen.  Anterior and lateral osteophytes are noted  along the lower thoracic spine.  IMPRESSION:  1.  No evidence of pulmonary embolus. 2.  Mild scattered bilateral atelectasis noted; lungs otherwise clear. 3.  Scattered coronary artery calcifications seen.   Original Report Authenticated By: Tonia Ghent, M.D.    Nm Myocar Multi W/spect W/wall Motion / Ef 07/17/2012  This examination was dictated by the cardiologist who supervised the examination.  Please see that report for details.  Lexiscan Myovue:  Indication: Chest Pain  The patient received .4 mg of lexiscan as a bolus.  Baseline ECG showed ST elevation in lead 3 and RBBB HR increased to 102 with injection and patient felt"whoozy"  BP stable at 124/67 mmHg  The images had to be reprocessed as I was unhappy with the alignment and boxing  Reprocessed images better but still somewhat suboptimal there was a small anteroapical wall infarct.  There was a moderate inferolateral wall infarct at the base  Interestingly there was no RWMA on surfact images EF 75% and QPS was 0  Impression:  Abnormal myovue with small anteroapical wall infarct and moderate inferolateral wall  infarct at base. No ischemia Note ECG during scan with ST elevation in lead 3 and earlier in day on admission appears to be ST elevation in leads 2,3 and F  PA and Dr Johney Frame notified  Charlton Haws MD [facility]   Original Report Authenticated By: Marin Roberts, M.D.    Dg Chest Port 1 View  07/17/2012  *RADIOLOGY REPORT*  Clinical Data: Chest pain and back pain over the past 2 hours.  PORTABLE CHEST - 1 VIEW 07/17/2012 0019 hours:  Comparison: Portable chest x-ray 11/19/2011.  Two-view chest x-ray 06/07/2011, 09/26/2010.  Findings: Suboptimal inspiration which accounts for atelectasis at the bases and accentuates the cardiac silhouette.  Taking this into account, cardiac silhouette upper normal in size and stable. Thoracic aorta tortuous and atherosclerotic but unchanged in appearance.  Hilar and mediastinal contours otherwise unremarkable.  Lungs otherwise clear.  IMPRESSION: Suboptimal inspiration accounts for bibasilar atelectasis.  No acute cardiopulmonary disease otherwise.   Original Report Authenticated By: Hulan Saas, M.D.     Discharge Medications   Current Discharge Medication List    START taking these medications   Details  omeprazole (PRILOSEC) 20 MG capsule Take 1 capsule (20 mg total) by mouth daily. Qty: 30 capsule, Refills: 2      CONTINUE these medications which have CHANGED   Details  nitroGLYCERIN (NITROSTAT) 0.4 MG SL tablet Place 1 tablet (0.4 mg total) under the tongue every 5 (five) minutes as needed for chest pain (up to 3 doses). Qty: 25 tablet, Refills: 4      CONTINUE these medications which have NOT CHANGED   Details  amLODipine (NORVASC) 2.5 MG tablet Take 2.5 mg by mouth daily.      aspirin 81 MG EC tablet Take 81 mg by mouth daily.      Calcium Carbonate-Vitamin D (CALTRATE 600+D) 600-400 MG-UNIT per chew tablet Chew 1 tablet by mouth 2 (two) times daily.      gabapentin (NEURONTIN) 300 MG capsule Take 600 mg by mouth 2 (two) times daily.     glipiZIDE (GLUCOTROL XL) 10 MG 24 hr tablet Take 10 mg by mouth daily.    Multiple Vitamin (MULITIVITAMIN WITH MINERALS) TABS Take 1 tablet by mouth daily.    rosuvastatin (CRESTOR) 20 MG tablet Take 20 mg by mouth daily.      warfarin (COUMADIN) 5 MG tablet Take 7.5-10 mg by mouth daily. 7.5 mg on all days except Monday. On Mondays, patient takes 10 mg.      STOP taking these medications     aspirin 81 MG chewable tablet Comments:  Reason for Stopping: duplicate med entered by pharmacy. i discussed with nursing staff to make sure pt is aware to continue taking the dose of aspirin noted above         Disposition   The patient will be discharged in stable condition to home. Discharge Orders    Future Appointments: Provider: Department: Dept Phone: Center:   07/21/2012 3:45 PM [facility] [phone] None   08/04/2012 11:30 AM [facility] [phone] None   08/04/2012 11:45 AM Pricilla Riffle, MD [facility] Main Office [place]) [phone] [facility]     Future Orders Please Complete By Expires   Diet - low sodium heart healthy      Increase activity slowly        Follow-up Information    Follow up with [facility]. (Coumadin clinic check on 07/21/12 at 3:45pm)  Contact information:   [facility] [address] [place] [address] [phone]      Follow up with Dietrich Pates, MD. (Coumadin clinic check 08/04/12 at 11:30am, then appointment with Dr. Tenny Craw at 11:45am)    Contact information:   [facility] [address] [place] [address] [phone]           Duration of Discharge Encounter: Greater than 30 minutes including physician and PA time.  Signed, Ronie Spies PA-C 07/18/2012, 9:29 AM  Hillis Range, MD

## 2012-07-18 NOTE — Progress Notes (Signed)
Patient: [name] / Admit Date: 07/16/2012 / Date of Encounter: 07/18/2012, 8:30 AM   Subjective  Chest pain has resolved. No SOB, nausea, diaphoresis, or any other symptoms.   Objective   Telemetry: NSR with 1st degree AVB -   Physical Exam: Filed Vitals:   07/18/12 0500  BP: 123/63  Pulse: 78  Temp: 97.9 F (36.6 C)  Resp: 18   General: Well developed, well nourished WF.   Head: Normocephalic, atraumatic, sclera non-icteric, no xanthomas, nares are without discharge. Neck: JVD not elevated. Lungs: Clear bilaterally to auscultation without wheezes, rales, or rhonchi. Breathing is unlabored. Heart: RRR S1 S2 without murmurs, rubs, or gallops.  Abdomen: Soft, non-distended with normoactive bowel sounds. No hepatomegaly. No rebound/guarding. No obvious abdominal masses.  + ttp over the L epigastric region which reproduces her pain Msk:  Strength and tone appear normal for age. Extremities: No clubbing or cyanosis. Tr sockline edema.  Distal pedal pulses are 2+ and equal bilaterally. Neuro: Alert and oriented X 3. Moves all extremities spontaneously. Psych:  Responds to questions appropriately with a normal affect.   No intake or output data in the 24 hours ending 07/18/12 0830  Inpatient Medications:     . amLODipine  2.5 mg Oral Daily  . aspirin EC  81 mg Oral Daily  . atorvastatin  80 mg Oral q1800  . calcium-vitamin D  1 tablet Oral BID  . gabapentin  600 mg Oral BID  . heparin  5,000 Units Subcutaneous Q8H  . insulin aspart  0-15 Units Subcutaneous Q4H  . multivitamin with minerals  1 tablet Oral Daily  . pantoprazole  40 mg Oral Daily    Labs:  [facility] 07/18/12 0620 07/17/12 0021  NA 139 141  K 4.3 4.1  CL 103 102  CO2 29 30  GLUCOSE 117* 113*  BUN 18 21  CREATININE 1.09 1.02  CALCIUM 9.0 9.7  MG -- --  PHOS -- --    Basename 07/17/12 0021  AST 24  ALT 20  ALKPHOS 46  BILITOT 0.3  PROT 6.4  ALBUMIN 3.3*    Basename 07/18/12 0620  07/17/12 0021  WBC 6.5 7.5  NEUTROABS -- --  HGB 12.8 12.9  HCT 38.0 37.7  MCV 92.7 91.3  PLT 176 194   Radiology/Studies:  Ct Angio Chest W/cm &/or Wo Cm 07/17/2012  *RADIOLOGY REPORT*  Clinical Data: Lambert Mody right-sided chest pain, worse with deep inspiration and movement.  CT ANGIOGRAPHY CHEST  Technique:  Multidetector CT imaging of the chest using the standard protocol during bolus administration of intravenous contrast. Multiplanar reconstructed images including MIPs were obtained and reviewed to evaluate the vascular anatomy.  Contrast: OMNIPAQUE IOHEXOL 350 MG/ML SOLN  Comparison: Chest radiograph performed earlier today at 12:19 a.m.  Findings: There is no evidence of pulmonary embolus.  Mild scattered bilateral atelectasis is noted.  The lungs are otherwise clear.  There is no evidence of significant focal consolidation, pleural effusion or pneumothorax.  No masses are identified; no abnormal focal contrast enhancement is seen.  Scattered coronary artery calcifications are seen.  The mediastinum is otherwise unremarkable in appearance.  No mediastinal lymphadenopathy is seen.  No pericardial effusion is identified. The great vessels are grossly unremarkable.  Mild scattered calcification is noted along the aortic arch  No axillary lymphadenopathy is seen.  A small focal calcification is noted within the right thyroid lobe; the thyroid gland is otherwise unremarkable.  The visualized portions of the liver and spleen are  unremarkable. The patient is status post cholecystectomy, with clips noted at the gallbladder fossa.  The visualized portions of the pancreas, stomach, adrenal glands and left kidney are unremarkable.  No acute osseous abnormalities are seen.  Anterior and lateral osteophytes are noted along the lower thoracic spine.  IMPRESSION:  1.  No evidence of pulmonary embolus. 2.  Mild scattered bilateral atelectasis noted; lungs otherwise clear. 3.  Scattered coronary artery  calcifications seen.   Original Report Authenticated By: Tonia Ghent, M.D.    Dg Chest Port 1 View  07/17/2012  *RADIOLOGY REPORT*  Clinical Data: Chest pain and back pain over the past 2 hours.  PORTABLE CHEST - 1 VIEW 07/17/2012 0019 hours:  Comparison: Portable chest x-ray 11/19/2011.  Two-view chest x-ray 06/07/2011, 09/26/2010.  Findings: Suboptimal inspiration which accounts for atelectasis at the bases and accentuates the cardiac silhouette.  Taking this into account, cardiac silhouette upper normal in size and stable. Thoracic aorta tortuous and atherosclerotic but unchanged in appearance.  Hilar and mediastinal contours otherwise unremarkable. Lungs otherwise clear.  IMPRESSION: Suboptimal inspiration accounts for bibasilar atelectasis.  No acute cardiopulmonary disease otherwise.   Original Report Authenticated By: Hulan Saas, M.D.      Assessment and Plan   1. Chest pain/abd pain/back pain - atypical features and CMs negative Low risk myoview, no further inpatient workup 2. PAF - resume home coumadin regimen 3. H/o CAD s/p DES mLAD 2005, CB angioplasty due to ISR 2006, last cath 08/2010 with nonobstructive disease. Continue ASA, statin. Not currently on any BB therapy, likely due to long 1st degree AV block/RBBB. See above. 4. Diabetes mellitus - SSI. 5. HTN - controlled.  6. Hyperlipidemia - continue statin. 7. H/o GERD/IBS  Dc to home Follow-up with Dr Wylene Men, MD 07/18/2012 8:30 AM

## 2012-07-21 ENCOUNTER — Ambulatory Visit (INDEPENDENT_AMBULATORY_CARE_PROVIDER_SITE_OTHER): Payer: Medicare Other | Admitting: Pharmacist

## 2012-07-21 DIAGNOSIS — I4891 Unspecified atrial fibrillation: Secondary | ICD-10-CM

## 2012-07-21 DIAGNOSIS — Z7901 Long term (current) use of anticoagulants: Secondary | ICD-10-CM

## 2012-07-28 ENCOUNTER — Ambulatory Visit: Payer: Medicare Other | Admitting: Gynecology

## 2012-08-04 ENCOUNTER — Ambulatory Visit (INDEPENDENT_AMBULATORY_CARE_PROVIDER_SITE_OTHER): Payer: Medicare Other | Admitting: Internal Medicine

## 2012-08-04 ENCOUNTER — Encounter: Payer: Self-pay | Admitting: Internal Medicine

## 2012-08-04 ENCOUNTER — Ambulatory Visit (INDEPENDENT_AMBULATORY_CARE_PROVIDER_SITE_OTHER): Payer: Medicare Other | Admitting: *Deleted

## 2012-08-04 VITALS — BP 113/67 | HR 71 | Ht 62.5 in | Wt 192.2 lb

## 2012-08-04 DIAGNOSIS — Z7901 Long term (current) use of anticoagulants: Secondary | ICD-10-CM

## 2012-08-04 DIAGNOSIS — I4891 Unspecified atrial fibrillation: Secondary | ICD-10-CM

## 2012-08-04 DIAGNOSIS — I25810 Atherosclerosis of coronary artery bypass graft(s) without angina pectoris: Secondary | ICD-10-CM

## 2012-08-04 DIAGNOSIS — E785 Hyperlipidemia, unspecified: Secondary | ICD-10-CM

## 2012-08-04 NOTE — Patient Instructions (Addendum)
Your physician wants you to follow-up in: September 2014.  You will receive a reminder letter in the mail two months in advance. If you don't receive a letter, please call our office to schedule the follow-up appointment.  

## 2012-08-04 NOTE — Progress Notes (Signed)
HPI  Patient is an 79 yo with a history of CAD and PAF  She is s/p DES to mid LAD in 2005.  PTCA 2006.   She was last in clnic in June In December she was admitted with CP  Last myoview in Dec 2013 with small anteroapical infarct, moderate inferolateral infarct.  No ischemia.  LVEF 75%.   She says she improved after an injection by Dr Jillyn Hidden Since injection she has  Done well.  NO CP  No SOB  No dizziness Allergies  Allergen Reactions  . Amoxicillin Other (See Comments)    UNKNOWN    Current Outpatient Prescriptions  Medication Sig Dispense Refill  . amLODipine (NORVASC) 2.5 MG tablet Take 2.5 mg by mouth daily.        Marland Kitchen aspirin 81 MG EC tablet Take 81 mg by mouth daily.        . Calcium Carbonate-Vitamin D (CALTRATE 600+D) 600-400 MG-UNIT per chew tablet Chew 1 tablet by mouth 2 (two) times daily.        Marland Kitchen gabapentin (NEURONTIN) 300 MG capsule Take 600 mg by mouth 2 (two) times daily.       Marland Kitchen glipiZIDE (GLUCOTROL XL) 10 MG 24 hr tablet Take 10 mg by mouth daily.      . Multiple Vitamin (MULITIVITAMIN WITH MINERALS) TABS Take 1 tablet by mouth daily.      . nitroGLYCERIN (NITROSTAT) 0.4 MG SL tablet Place 1 tablet (0.4 mg total) under the tongue every 5 (five) minutes as needed for chest pain (up to 3 doses).  25 tablet  4  . rosuvastatin (CRESTOR) 20 MG tablet Take 20 mg by mouth daily.        Marland Kitchen warfarin (COUMADIN) 5 MG tablet Take 7.5-10 mg by mouth daily. 7.5 mg on all days except Monday. On Mondays, patient takes 10 mg.        Past Medical History  Diagnosis Date  . Allergic rhinitis   . Anemia     NOS chronic disease. Hgb 11.6gm% 04/14/2009  . CAD (coronary artery disease)     a. s/p DES mLAD 2005. b. Cutting balloon angioplasty for ISR 2006. c. LHC 08/2010 nonobstructive. d. Myoview 06/2012: small anteroapical infarct/mod inferolat wall infarct but no ischemia, EF 75%.  . Diabetes mellitus type II   . Diverticulitis of colon   . GERD (gastroesophageal reflux disease)   .  Hyperlipidemia   . HTN (hypertension)   . DM neuropathy, type II diabetes mellitus   . IBS (irritable bowel syndrome)   . Spinal stenosis     djd lumbar  . Multinodular goiter   . Orthostatic hypotension   . Obesity     BMI-37  . Acute renal failure     due to dehydration-Sept 2009; Normal creat 1.2 on fu 05/13/2009  . Hx of colonoscopy   . Cancer     on nose and had it removed  . DEMENTIA     slight  . Osteoarthritis     lower back  . Pneumonia   . PAF (paroxysmal atrial fibrillation)     noted on event monitor. No correlation with episodes and symptoms  . Chronic anticoagulation   . RBBB   . 1St degree AV block     Past Surgical History  Procedure Date  . Cholecystectomy   . Abdominal hysterectomy   . Nasal sinus surgery     x2  . Bilateral arthroscopic knee surgery   . Stent surgery   .  Coronary angioplasty     stent  . Carpel tunnel wrist rt     Family History  Problem Relation Age of Onset  . Breast cancer Sister   . Heart disease Brother   . Colon cancer Neg Hx     History   Social History  . Marital Status: Married    Spouse Name: N/A    Number of Children: N/A  . Years of Education: N/A   Occupational History  . retired    Social History Main Topics  . Smoking status: Never Smoker   . Smokeless tobacco: Never Used  . Alcohol Use: No  . Drug Use: No  . Sexually Active: No   Other Topics Concern  . Not on file   Social History Narrative  . No narrative on file    Review of Systems:  All systems reviewed.  They are negative to the above problem except as previously stated.  Vital Signs: BP 113/67  Pulse 71  Ht 5' 2.5" (1.588 m)  Wt 192 lb 3.2 oz (87.181 kg)  BMI 34.59 kg/m2  Physical Exam Patient is in NAD HEENT:  Normocephalic, atraumatic. EOMI, PERRLA.  Neck: JVP is normal.  No bruits.  Lungs: clear to auscultation. No rales no wheezes.  Heart: Regular rate and rhythm. Normal S1, S2. No S3.   No significant murmurs. PMI not  displaced.  Abdomen:  Supple, nontender. Normal bowel sounds. No masses. No hepatomegaly.  Extremities:   Good distal pulses throughout. No lower extremity edema.  Musculoskeletal :moving all extremities.  Neuro:   alert and oriented x3.  CN II-XII grossly intact.   Assessment and Plan:  1.  CAD  No symptoms to sugg angina. Patient said last episode of pain was Dec 2102  Helped by injection that was done by Dr Jillyn Hidden.  Will get records.  2. PAF  Continue coumadin.  3.  HL Will get labs from Dr Yehuda Budd office.  4.  Dizziness.  Denies  Encouraged her to stay active.  Given info for [employer].  Will f/u in Sept.

## 2012-08-21 ENCOUNTER — Telehealth: Payer: Self-pay | Admitting: Internal Medicine

## 2012-08-21 NOTE — Telephone Encounter (Signed)
Pt had a real sharpe pain yesterday from her waist on the right side all the way down to her right arm and she is wanting talk to someone

## 2012-08-21 NOTE — Telephone Encounter (Signed)
Pt had a tingle iin her r shoulder and hand yesterday for a few seconds.  Pt is fine now.  Denies chest pain, sob, n/v.  Advised to follow up with Herb Grays, MD, PCP.  Pt agreed with plan.

## 2012-09-01 ENCOUNTER — Ambulatory Visit (INDEPENDENT_AMBULATORY_CARE_PROVIDER_SITE_OTHER): Payer: Medicare Other | Admitting: *Deleted

## 2012-09-01 DIAGNOSIS — Z7901 Long term (current) use of anticoagulants: Secondary | ICD-10-CM

## 2012-09-01 DIAGNOSIS — I4891 Unspecified atrial fibrillation: Secondary | ICD-10-CM

## 2012-09-11 ENCOUNTER — Other Ambulatory Visit: Payer: Self-pay | Admitting: *Deleted

## 2012-09-11 ENCOUNTER — Other Ambulatory Visit: Payer: Self-pay | Admitting: Internal Medicine

## 2012-09-21 ENCOUNTER — Emergency Department (HOSPITAL_COMMUNITY): Payer: Medicare Other

## 2012-09-21 ENCOUNTER — Encounter (HOSPITAL_COMMUNITY): Payer: Self-pay | Admitting: *Deleted

## 2012-09-21 ENCOUNTER — Emergency Department (HOSPITAL_COMMUNITY)
Admission: EM | Admit: 2012-09-21 | Discharge: 2012-09-21 | Disposition: A | Discharge status: Home / Self Care | Payer: Medicare Other | Attending: Emergency Medicine | Admitting: Emergency Medicine

## 2012-09-21 DIAGNOSIS — I10 Essential (primary) hypertension: Secondary | ICD-10-CM | POA: Insufficient documentation

## 2012-09-21 DIAGNOSIS — S0003XA Contusion of scalp, initial encounter: Secondary | ICD-10-CM | POA: Insufficient documentation

## 2012-09-21 DIAGNOSIS — Z8522 Personal history of malignant neoplasm of nasal cavities, middle ear, and accessory sinuses: Secondary | ICD-10-CM | POA: Insufficient documentation

## 2012-09-21 DIAGNOSIS — Z8679 Personal history of other diseases of the circulatory system: Secondary | ICD-10-CM | POA: Insufficient documentation

## 2012-09-21 DIAGNOSIS — I2510 Atherosclerotic heart disease of native coronary artery without angina pectoris: Secondary | ICD-10-CM | POA: Insufficient documentation

## 2012-09-21 DIAGNOSIS — N390 Urinary tract infection, site not specified: Secondary | ICD-10-CM

## 2012-09-21 DIAGNOSIS — Z8739 Personal history of other diseases of the musculoskeletal system and connective tissue: Secondary | ICD-10-CM | POA: Insufficient documentation

## 2012-09-21 DIAGNOSIS — Z8701 Personal history of pneumonia (recurrent): Secondary | ICD-10-CM | POA: Insufficient documentation

## 2012-09-21 DIAGNOSIS — Z8742 Personal history of other diseases of the female genital tract: Secondary | ICD-10-CM | POA: Insufficient documentation

## 2012-09-21 DIAGNOSIS — I951 Orthostatic hypotension: Secondary | ICD-10-CM | POA: Insufficient documentation

## 2012-09-21 DIAGNOSIS — Z7901 Long term (current) use of anticoagulants: Secondary | ICD-10-CM | POA: Insufficient documentation

## 2012-09-21 DIAGNOSIS — Z862 Personal history of diseases of the blood and blood-forming organs and certain disorders involving the immune mechanism: Secondary | ICD-10-CM | POA: Insufficient documentation

## 2012-09-21 DIAGNOSIS — Z79899 Other long term (current) drug therapy: Secondary | ICD-10-CM | POA: Insufficient documentation

## 2012-09-21 DIAGNOSIS — Y9389 Activity, other specified: Secondary | ICD-10-CM | POA: Insufficient documentation

## 2012-09-21 DIAGNOSIS — W1809XA Striking against other object with subsequent fall, initial encounter: Secondary | ICD-10-CM | POA: Insufficient documentation

## 2012-09-21 DIAGNOSIS — M549 Dorsalgia, unspecified: Secondary | ICD-10-CM

## 2012-09-21 DIAGNOSIS — W19XXXA Unspecified fall, initial encounter: Secondary | ICD-10-CM

## 2012-09-21 DIAGNOSIS — Z8719 Personal history of other diseases of the digestive system: Secondary | ICD-10-CM | POA: Insufficient documentation

## 2012-09-21 DIAGNOSIS — IMO0002 Reserved for concepts with insufficient information to code with codable children: Secondary | ICD-10-CM | POA: Insufficient documentation

## 2012-09-21 DIAGNOSIS — R5381 Other malaise: Secondary | ICD-10-CM | POA: Insufficient documentation

## 2012-09-21 DIAGNOSIS — E785 Hyperlipidemia, unspecified: Secondary | ICD-10-CM | POA: Insufficient documentation

## 2012-09-21 DIAGNOSIS — Z7982 Long term (current) use of aspirin: Secondary | ICD-10-CM | POA: Insufficient documentation

## 2012-09-21 DIAGNOSIS — Z8659 Personal history of other mental and behavioral disorders: Secondary | ICD-10-CM | POA: Insufficient documentation

## 2012-09-21 DIAGNOSIS — Y9289 Other specified places as the place of occurrence of the external cause: Secondary | ICD-10-CM | POA: Insufficient documentation

## 2012-09-21 DIAGNOSIS — S0993XA Unspecified injury of face, initial encounter: Secondary | ICD-10-CM | POA: Insufficient documentation

## 2012-09-21 DIAGNOSIS — Z8639 Personal history of other endocrine, nutritional and metabolic disease: Secondary | ICD-10-CM | POA: Insufficient documentation

## 2012-09-21 DIAGNOSIS — E669 Obesity, unspecified: Secondary | ICD-10-CM | POA: Insufficient documentation

## 2012-09-21 DIAGNOSIS — E119 Type 2 diabetes mellitus without complications: Secondary | ICD-10-CM | POA: Insufficient documentation

## 2012-09-21 LAB — URINALYSIS, ROUTINE W REFLEX MICROSCOPIC
Bilirubin Urine: NEGATIVE
Glucose, UA: NEGATIVE mg/dL
Hgb urine dipstick: NEGATIVE
Ketones, ur: NEGATIVE mg/dL
Nitrite: NEGATIVE
Protein, ur: NEGATIVE mg/dL
Specific Gravity, Urine: 1.015 (ref 1.005–1.030)
Urobilinogen, UA: 0.2 mg/dL (ref 0.0–1.0)
pH: 5 (ref 5.0–8.0)

## 2012-09-21 LAB — CBC WITH DIFFERENTIAL/PLATELET
Basophils Absolute: 0.1 K/uL (ref 0.0–0.1)
Basophils Relative: 1 % (ref 0–1)
Eosinophils Absolute: 0.2 10*3/uL (ref 0.0–0.7)
Eosinophils Relative: 2 % (ref 0–5)
HCT: 35.4 % — ABNORMAL LOW (ref 36.0–46.0)
Hemoglobin: 12.5 g/dL (ref 12.0–15.0)
Lymphocytes Relative: 23 % (ref 12–46)
Lymphs Abs: 1.7 10*3/uL (ref 0.7–4.0)
MCH: 32.6 pg (ref 26.0–34.0)
MCHC: 35.3 g/dL (ref 30.0–36.0)
MCV: 92.2 fL (ref 78.0–100.0)
Monocytes Absolute: 1.1 K/uL — ABNORMAL HIGH (ref 0.1–1.0)
Monocytes Relative: 15 % — ABNORMAL HIGH (ref 3–12)
Neutro Abs: 4.3 10*3/uL (ref 1.7–7.7)
Neutrophils Relative %: 59 % (ref 43–77)
Platelets: 186 10*3/uL (ref 150–400)
RBC: 3.84 MIL/uL — ABNORMAL LOW (ref 3.87–5.11)
RDW: 13.8 % (ref 11.5–15.5)
WBC: 7.3 K/uL (ref 4.0–10.5)

## 2012-09-21 LAB — BASIC METABOLIC PANEL WITH GFR
BUN: 14 mg/dL (ref 6–23)
CO2: 31 meq/L (ref 19–32)
Calcium: 9.2 mg/dL (ref 8.4–10.5)
Creatinine, Ser: 1.13 mg/dL — ABNORMAL HIGH (ref 0.50–1.10)

## 2012-09-21 LAB — BASIC METABOLIC PANEL
Chloride: 100 mEq/L (ref 96–112)
GFR calc Af Amer: 50 mL/min — ABNORMAL LOW (ref >90)
GFR calc non Af Amer: 43 mL/min — ABNORMAL LOW (ref >90)
Glucose, Bld: 135 mg/dL — ABNORMAL HIGH (ref 70–99)
Potassium: 4.2 mEq/L (ref 3.5–5.1)
Sodium: 137 mEq/L (ref 135–145)

## 2012-09-21 LAB — POCT I-STAT TROPONIN I: Troponin i, poc: 0.01 ng/mL (ref 0.00–0.08)

## 2012-09-21 LAB — URINE MICROSCOPIC-ADD ON

## 2012-09-21 MED ORDER — TRAMADOL HCL 50 MG PO TABS: 50.0000 mg | ORAL_TABLET | Freq: Four times a day (QID) | ORAL | Status: DC | PRN

## 2012-09-21 MED ORDER — SULFAMETHOXAZOLE-TRIMETHOPRIM 800-160 MG PO TABS: 1.0000 | ORAL_TABLET | Freq: Two times a day (BID) | ORAL | Status: DC

## 2012-09-21 MED ORDER — HYDROCODONE-ACETAMINOPHEN 5-325 MG PO TABS
1.0000 | ORAL_TABLET | Freq: Once | ORAL | Status: AC
  Administered 2012-09-21: 1 via ORAL
  Filled 2012-09-21: qty 1

## 2012-09-21 NOTE — ED Provider Notes (Signed)
Medical screening examination/treatment/procedure(s) were conducted as a shared visit with non-physician practitioner(s) and myself.  I personally evaluated the patient during the encounter  Pt with generalized weakness, failed twice.  Plain films of low back is neg.  Pt with low grade fever, evidence of UTI, will be discharged with Rx for antibiotics and recommend close outpt follow up.  Pt is alert, oriented, no focal neuro deficits, lungs clear, no rash or stiff neck.  Abd is soft, no guard or rebound tenderness. Low back has mild B symmetric muscular spasms.    Not septic appearing.  Pt has help at home.    Filed Vitals:   09/21/12 0730  BP: 139/50  Pulse: 81  Temp:   Resp:      Gavin Pound. Oletta Lamas, MD 09/21/12 [phone]

## 2012-09-21 NOTE — ED Provider Notes (Signed)
History     CSN: [id number]  Arrival date & time 09/21/12  4098   First MD Initiated Contact with Patient 09/21/12 [id number]      Chief Complaint  Patient presents with  . Fall  . Weakness    (Consider location/radiation/quality/duration/timing/severity/associated sxs/prior treatment) Patient is a 79 y.o. female presenting with fall and weakness. The history is provided by the patient. No language interpreter was used.  Fall The accident occurred 6 to 12 hours ago. Fall occurred: She was getting back into bed after going to the bathroom around 1:30 a.m. and fell after lifting her leg to get back into bed.  Point of impact: She states she fell to a sitting position then against a piece of furniture causing bruising to forehead. She was not ambulatory at the scene. There was no entrapment after the fall. There was no drug use involved in the accident. There was no alcohol use involved in the accident. Associated symptoms include headaches. Pertinent negatives include no fever, no abdominal pain and no nausea. Associated symptoms comments: Her back pain is the worst pain since fall. .  Weakness Associated symptoms include headaches, neck pain and weakness. Pertinent negatives include no abdominal pain, chest pain, chills, fever or nausea.    Past Medical History  Diagnosis Date  . Allergic rhinitis   . Anemia     NOS chronic disease. Hgb 11.6gm% 04/14/2009  . CAD (coronary artery disease)     a. s/p DES mLAD 2005. b. Cutting balloon angioplasty for ISR 2006. c. LHC 08/2010 nonobstructive. d. Myoview 06/2012: small anteroapical infarct/mod inferolat wall infarct but no ischemia, EF 75%.  . Diabetes mellitus type II   . Diverticulitis of colon   . GERD (gastroesophageal reflux disease)   . Hyperlipidemia   . HTN (hypertension)   . DM neuropathy, type II diabetes mellitus   . IBS (irritable bowel syndrome)   . Spinal stenosis     djd lumbar  . Multinodular goiter   . Orthostatic hypotension    . Obesity     BMI-37  . Acute renal failure     due to dehydration-Sept 2009; Normal creat 1.2 on fu 05/13/2009  . Hx of colonoscopy   . Cancer     on nose and had it removed  . DEMENTIA     slight  . Osteoarthritis     lower back  . Pneumonia   . PAF (paroxysmal atrial fibrillation)     noted on event monitor. No correlation with episodes and symptoms  . Chronic anticoagulation   . RBBB   . 1St degree AV block     Past Surgical History  Procedure Laterality Date  . Cholecystectomy    . Abdominal hysterectomy    . Nasal sinus surgery      x2  . Bilateral arthroscopic knee surgery    . Stent surgery    . Coronary angioplasty      stent  . Carpel tunnel wrist rt      Family History  Problem Relation Age of Onset  . Breast cancer Sister   . Heart disease Brother   . Colon cancer Neg Hx     History  Substance Use Topics  . Smoking status: Never Smoker   . Smokeless tobacco: Never Used  . Alcohol Use: No    OB History   Grav Para Term Preterm Abortions TAB SAB Ect Mult Living  Review of Systems  Constitutional: Negative for fever and chills.  HENT: Positive for neck pain.   Eyes: Negative for visual disturbance.  Respiratory: Negative.  Negative for shortness of breath.   Cardiovascular: Negative.  Negative for chest pain.  Gastrointestinal: Negative.  Negative for nausea and abdominal pain.  Genitourinary: Negative.  Negative for dysuria.  Musculoskeletal:       See HPI.  Skin: Negative.   Neurological: Positive for weakness and headaches.  Psychiatric/Behavioral: Negative for confusion.    Allergies  Amoxicillin  Home Medications   Current Outpatient Rx  Name  Route  Sig  Dispense  Refill  . amLODipine (NORVASC) 2.5 MG tablet   Oral   Take 2.5 mg by mouth daily.           Marland Kitchen aspirin 81 MG EC tablet   Oral   Take 81 mg by mouth daily.           . Calcium Carbonate-Vit D-Min 1200-1000 MG-UNIT CHEW   Oral   Chew 1  tablet by mouth daily.         Marland Kitchen gabapentin (NEURONTIN) 300 MG capsule   Oral   Take 600 mg by mouth 2 (two) times daily.          Marland Kitchen glipiZIDE (GLUCOTROL XL) 10 MG 24 hr tablet   Oral   Take 10 mg by mouth daily.         . Multiple Vitamin (MULITIVITAMIN WITH MINERALS) TABS   Oral   Take 1 tablet by mouth daily.         . rosuvastatin (CRESTOR) 20 MG tablet   Oral   Take 20 mg by mouth daily.           . traMADol (ULTRAM) 50 MG tablet   Oral   Take 50 mg by mouth every 6 (six) hours as needed for pain.         Marland Kitchen warfarin (COUMADIN) 5 MG tablet   Oral   Take 7.5-10 mg by mouth See admin instructions. Takes 1.5 tablets (7.5mg ) Tues-Sunday. Takes 2 tablets (10mg ) on Monday.         . nitroGLYCERIN (NITROSTAT) 0.4 MG SL tablet   Sublingual   Place 1 tablet (0.4 mg total) under the tongue every 5 (five) minutes as needed for chest pain (up to 3 doses).   25 tablet   4     BP 120/63  Pulse 77  Temp(Src) 100.3 F (37.9 C) (Rectal)  Resp 19  SpO2 97%  Physical Exam  Constitutional: She is oriented to person, place, and time. She appears well-developed and well-nourished.  HENT:  Head: Normocephalic.  Bruise to right upper forehead with minimal swelling. No other facial bruising or tenderness.   Eyes: Conjunctivae are normal. Pupils are equal, round, and reactive to light.  Neck: Normal range of motion. Neck supple.  Cardiovascular: Normal rate and regular rhythm.   Pulmonary/Chest: Effort normal and breath sounds normal. She exhibits no tenderness.  Abdominal: Soft. Bowel sounds are normal. There is no tenderness. There is no rebound and no guarding.  Musculoskeletal: Normal range of motion.  Midline and paralumbar tenderness without swelling. FROM LE's and UE's. Mild midline cervical tenderness with FROM.  Neurological: She is alert and oriented to person, place, and time. Coordination normal.  Skin: Skin is warm and dry. No rash noted.  Psychiatric: She  has a normal mood and affect.    ED Course  Procedures (including critical care time)  Labs Reviewed  CBC WITH DIFFERENTIAL - Abnormal; Notable for the following:    RBC 3.84 (*)    HCT 35.4 (*)    Monocytes Relative 15 (*)    Monocytes Absolute 1.1 (*)    All other components within normal limits  BASIC METABOLIC PANEL - Abnormal; Notable for the following:    Glucose, Bld 135 (*)    Creatinine, Ser 1.13 (*)    GFR calc non Af Amer 43 (*)    GFR calc Af Amer 50 (*)    All other components within normal limits  URINE CULTURE  URINALYSIS, ROUTINE W REFLEX MICROSCOPIC  APTT  PROTIME-INR  POCT I-STAT TROPONIN I   No results found.   No diagnosis found.  1. Fall 2. uti 3. Back pain  MDM  X-rays negative, labs show UTI, culture pending. She is feeling better with pain medication - more comfortable. Able to ambulate without difficulty and is fully weight bearing. Feel she is stable for discharge.         Arnoldo Hooker, PA-C 09/21/12 [phone]

## 2012-09-21 NOTE — ED Notes (Signed)
Pt has had increased falls secondary to increased weakness. Fell on Friday transferring to the toilet and fell again around 0130 this am trying to get on toilet. Complaining of back and sacral pain 10/10. Pt neuro intact, alert and oriented x 4.

## 2012-09-22 LAB — URINE CULTURE: Colony Count: >=100000

## 2012-09-24 ENCOUNTER — Emergency Department (HOSPITAL_COMMUNITY)
Admission: EM | Admit: 2012-09-24 | Discharge: 2012-09-24 | Disposition: A | Discharge status: Home / Self Care | Payer: Medicare Other | Attending: Emergency Medicine | Admitting: Emergency Medicine

## 2012-09-24 ENCOUNTER — Encounter (HOSPITAL_COMMUNITY): Payer: Self-pay | Admitting: Emergency Medicine

## 2012-09-24 ENCOUNTER — Emergency Department (HOSPITAL_COMMUNITY): Payer: Medicare Other

## 2012-09-24 DIAGNOSIS — Y92009 Unspecified place in unspecified non-institutional (private) residence as the place of occurrence of the external cause: Secondary | ICD-10-CM | POA: Insufficient documentation

## 2012-09-24 DIAGNOSIS — S0993XA Unspecified injury of face, initial encounter: Secondary | ICD-10-CM | POA: Insufficient documentation

## 2012-09-24 DIAGNOSIS — I10 Essential (primary) hypertension: Secondary | ICD-10-CM | POA: Insufficient documentation

## 2012-09-24 DIAGNOSIS — E669 Obesity, unspecified: Secondary | ICD-10-CM | POA: Insufficient documentation

## 2012-09-24 DIAGNOSIS — I2510 Atherosclerotic heart disease of native coronary artery without angina pectoris: Secondary | ICD-10-CM | POA: Insufficient documentation

## 2012-09-24 DIAGNOSIS — E1142 Type 2 diabetes mellitus with diabetic polyneuropathy: Secondary | ICD-10-CM | POA: Insufficient documentation

## 2012-09-24 DIAGNOSIS — Y9389 Activity, other specified: Secondary | ICD-10-CM | POA: Insufficient documentation

## 2012-09-24 DIAGNOSIS — Z8659 Personal history of other mental and behavioral disorders: Secondary | ICD-10-CM | POA: Insufficient documentation

## 2012-09-24 DIAGNOSIS — W010XXA Fall on same level from slipping, tripping and stumbling without subsequent striking against object, initial encounter: Secondary | ICD-10-CM | POA: Insufficient documentation

## 2012-09-24 DIAGNOSIS — Z792 Long term (current) use of antibiotics: Secondary | ICD-10-CM | POA: Insufficient documentation

## 2012-09-24 DIAGNOSIS — Z8679 Personal history of other diseases of the circulatory system: Secondary | ICD-10-CM | POA: Insufficient documentation

## 2012-09-24 DIAGNOSIS — E1149 Type 2 diabetes mellitus with other diabetic neurological complication: Secondary | ICD-10-CM | POA: Insufficient documentation

## 2012-09-24 DIAGNOSIS — E785 Hyperlipidemia, unspecified: Secondary | ICD-10-CM | POA: Insufficient documentation

## 2012-09-24 DIAGNOSIS — Z8701 Personal history of pneumonia (recurrent): Secondary | ICD-10-CM | POA: Insufficient documentation

## 2012-09-24 DIAGNOSIS — Z7901 Long term (current) use of anticoagulants: Secondary | ICD-10-CM | POA: Insufficient documentation

## 2012-09-24 DIAGNOSIS — Z8522 Personal history of malignant neoplasm of nasal cavities, middle ear, and accessory sinuses: Secondary | ICD-10-CM | POA: Insufficient documentation

## 2012-09-24 DIAGNOSIS — Z8639 Personal history of other endocrine, nutritional and metabolic disease: Secondary | ICD-10-CM | POA: Insufficient documentation

## 2012-09-24 DIAGNOSIS — Z79899 Other long term (current) drug therapy: Secondary | ICD-10-CM | POA: Insufficient documentation

## 2012-09-24 DIAGNOSIS — Z8719 Personal history of other diseases of the digestive system: Secondary | ICD-10-CM | POA: Insufficient documentation

## 2012-09-24 DIAGNOSIS — Z8742 Personal history of other diseases of the female genital tract: Secondary | ICD-10-CM | POA: Insufficient documentation

## 2012-09-24 DIAGNOSIS — Z7982 Long term (current) use of aspirin: Secondary | ICD-10-CM | POA: Insufficient documentation

## 2012-09-24 DIAGNOSIS — Z862 Personal history of diseases of the blood and blood-forming organs and certain disorders involving the immune mechanism: Secondary | ICD-10-CM | POA: Insufficient documentation

## 2012-09-24 DIAGNOSIS — Z8739 Personal history of other diseases of the musculoskeletal system and connective tissue: Secondary | ICD-10-CM | POA: Insufficient documentation

## 2012-09-24 DIAGNOSIS — Z9889 Other specified postprocedural states: Secondary | ICD-10-CM | POA: Insufficient documentation

## 2012-09-24 LAB — CBC WITH DIFFERENTIAL/PLATELET
Basophils Absolute: 0 10*3/uL (ref 0.0–0.1)
Eosinophils Absolute: 0.1 10*3/uL (ref 0.0–0.7)
Eosinophils Relative: 1 % (ref 0–5)
Lymphs Abs: 1.1 10*3/uL (ref 0.7–4.0)
MCH: 32.5 pg (ref 26.0–34.0)
MCV: 89.9 fL (ref 78.0–100.0)
Platelets: 218 10*3/uL (ref 150–400)
RDW: 13.6 % (ref 11.5–15.5)

## 2012-09-24 LAB — BASIC METABOLIC PANEL
Calcium: 9 mg/dL (ref 8.4–10.5)
GFR calc non Af Amer: 39 mL/min — ABNORMAL LOW (ref >90)
Glucose, Bld: 107 mg/dL — ABNORMAL HIGH (ref 70–99)
Sodium: 131 mEq/L — ABNORMAL LOW (ref 135–145)

## 2012-09-24 LAB — PROTIME-INR: INR: 4.98 — ABNORMAL HIGH (ref 0.00–1.49)

## 2012-09-24 MED ORDER — OXYCODONE-ACETAMINOPHEN 5-325 MG PO TABS
1.0000 | ORAL_TABLET | Freq: Once | ORAL | Status: AC
  Administered 2012-09-24: 1 via ORAL
  Filled 2012-09-24: qty 1

## 2012-09-24 NOTE — ED Notes (Signed)
Pt transported to radiology.

## 2012-09-24 NOTE — ED Notes (Addendum)
Per EMS pt came from home where she fell while getting out of her car onto gravel.  Pt has had increased falls at home from losing balance and family insisted she come to ED to be evaluated. Denies dizziness or weakness. Pt has superficial scratch to left temple from fall today and bruise to right forehead that pt states is from previous fall. Pt is on blood thinners at home. Pt is AAOx4 and was ambulatory on scene and denies pain. VSS.

## 2012-09-24 NOTE — ED Provider Notes (Signed)
History     CSN: [id number]  Arrival date & time 09/24/12  1719   First MD Initiated Contact with Patient 09/24/12 1748      Chief Complaint  Patient presents with  . Fall   HPI 79 year old female presents to the emergency department after a fall. Reports that she got out of her vehicle and slipped on some gravel. The left side of the scalp on ground. No loss of consciousness. No amnesia. No neck pain. No chest pain. No abdominal pain. No other extremity pain. Does have a history of 2 previous falls earlier this week. Both are described as mechanical as well. Was told to use walker. Was not using walker at the time of the injury today. No other symptoms.  Past Medical History  Diagnosis Date  . Allergic rhinitis   . Anemia     NOS chronic disease. Hgb 11.6gm% 04/14/2009  . CAD (coronary artery disease)     a. s/p DES mLAD 2005. b. Cutting balloon angioplasty for ISR 2006. c. LHC 08/2010 nonobstructive. d. Myoview 06/2012: small anteroapical infarct/mod inferolat wall infarct but no ischemia, EF 75%.  . Diabetes mellitus type II   . Diverticulitis of colon   . GERD (gastroesophageal reflux disease)   . Hyperlipidemia   . HTN (hypertension)   . DM neuropathy, type II diabetes mellitus   . IBS (irritable bowel syndrome)   . Spinal stenosis     djd lumbar  . Multinodular goiter   . Orthostatic hypotension   . Obesity     BMI-37  . Acute renal failure     due to dehydration-Sept 2009; Normal creat 1.2 on fu 05/13/2009  . Hx of colonoscopy   . Cancer     on nose and had it removed  . DEMENTIA     slight  . Osteoarthritis     lower back  . Pneumonia   . PAF (paroxysmal atrial fibrillation)     noted on event monitor. No correlation with episodes and symptoms  . Chronic anticoagulation   . RBBB   . 1St degree AV block     Past Surgical History  Procedure Laterality Date  . Cholecystectomy    . Abdominal hysterectomy    . Nasal sinus surgery      x2  . Bilateral  arthroscopic knee surgery    . Stent surgery    . Coronary angioplasty      stent  . Carpel tunnel wrist rt      Family History  Problem Relation Age of Onset  . Breast cancer Sister   . Heart disease Brother   . Colon cancer Neg Hx     History  Substance Use Topics  . Smoking status: Never Smoker   . Smokeless tobacco: Never Used  . Alcohol Use: No    Review of Systems  Constitutional: Negative for fever and chills.  HENT: Negative for congestion, rhinorrhea, neck pain and neck stiffness.   Respiratory: Negative for cough and shortness of breath.   Cardiovascular: Negative for chest pain.  Gastrointestinal: Negative for nausea, vomiting, abdominal pain, diarrhea and abdominal distention.  Endocrine: Negative for polyuria.  Genitourinary: Negative for dysuria.  Skin: Negative for rash.  Neurological: Negative for headaches.  Psychiatric/Behavioral: Negative.   All other systems reviewed and are negative.    Allergies  Amoxicillin  Home Medications   Current Outpatient Rx  Name  Route  Sig  Dispense  Refill  . amLODipine (NORVASC) 2.5 MG tablet  Oral   Take 2.5 mg by mouth daily.           Marland Kitchen aspirin 81 MG EC tablet   Oral   Take 81 mg by mouth daily.           . Calcium Carbonate-Vit D-Min 1200-1000 MG-UNIT CHEW   Oral   Chew 1 tablet by mouth daily.         Marland Kitchen gabapentin (NEURONTIN) 300 MG capsule   Oral   Take 600 mg by mouth 2 (two) times daily.          Marland Kitchen glipiZIDE (GLUCOTROL XL) 10 MG 24 hr tablet   Oral   Take 10 mg by mouth daily.         . Multiple Vitamin (MULITIVITAMIN WITH MINERALS) TABS   Oral   Take 1 tablet by mouth daily.         . nitroGLYCERIN (NITROSTAT) 0.4 MG SL tablet   Sublingual   Place 1 tablet (0.4 mg total) under the tongue every 5 (five) minutes as needed for chest pain (up to 3 doses).   25 tablet   4   . rosuvastatin (CRESTOR) 20 MG tablet   Oral   Take 20 mg by mouth daily.           Marland Kitchen  sulfamethoxazole-trimethoprim (SEPTRA DS) 800-160 MG per tablet   Oral   Take 1 tablet by mouth every 12 (twelve) hours.   10 tablet   0   . traMADol (ULTRAM) 50 MG tablet   Oral   Take 1 tablet (50 mg total) by mouth every 6 (six) hours as needed for pain.   20 tablet   0   . warfarin (COUMADIN) 5 MG tablet   Oral   Take 7.5-10 mg by mouth See admin instructions. Takes 1.5 tablets (7.5mg ) Tues-Sunday. Takes 2 tablets (10mg ) on Monday.           BP 131/54  Pulse 77  Temp(Src) 98.3 F (36.8 C) (Oral)  Resp 20  SpO2 97%  Physical Exam  Nursing note and vitals reviewed. Constitutional: She is oriented to person, place, and time. She appears well-developed and well-nourished. No distress.  HENT:  Head: Normocephalic and atraumatic.  Right Ear: External ear normal.  Left Ear: External ear normal.  Nose: Nose normal.  Mouth/Throat: Oropharynx is clear and moist. No oropharyngeal exudate.  Eyes: EOM are normal. Pupils are equal, round, and reactive to light.  Neck: Normal range of motion. Neck supple. No tracheal deviation present.  Cardiovascular: Normal rate.   Pulmonary/Chest: Effort normal and breath sounds normal. No stridor. No respiratory distress. She has no wheezes. She has no rales.  Abdominal: Soft. She exhibits no distension. There is no tenderness. There is no rebound.  Musculoskeletal: Normal range of motion.       Cervical back: She exhibits no bony tenderness.       Thoracic back: She exhibits no bony tenderness.       Lumbar back: She exhibits no bony tenderness.  No evidence of extremity trauma.  Neurological: She is alert and oriented to person, place, and time. She has normal strength and normal reflexes. No cranial nerve deficit or sensory deficit. She displays a negative Romberg sign. Coordination normal. GCS eye subscore is 4. GCS verbal subscore is 5. GCS motor subscore is 6.  Skin: Skin is warm and dry. She is not diaphoretic.    ED Course   Procedures (including critical care time)  Labs  Reviewed  CBC WITH DIFFERENTIAL - Abnormal; Notable for the following:    RBC 3.66 (*)    Hemoglobin 11.9 (*)    HCT 32.9 (*)    MCHC 36.2 (*)    Monocytes Relative 14 (*)    All other components within normal limits  BASIC METABOLIC PANEL - Abnormal; Notable for the following:    Sodium 131 (*)    Glucose, Bld 107 (*)    Creatinine, Ser 1.21 (*)    GFR calc non Af Amer 39 (*)    GFR calc Af Amer 46 (*)    All other components within normal limits  PROTIME-INR - Abnormal; Notable for the following:    Prothrombin Time 43.0 (*)    INR 4.98 (*)    All other components within normal limits  APTT - Abnormal; Notable for the following:    aPTT 71 (*)    All other components within normal limits   Ct Head Wo Contrast  09/24/2012  *RADIOLOGY REPORT*  Clinical Data: Larey Seat  CT HEAD WITHOUT CONTRAST  Technique:  Contiguous axial images were obtained from the base of the skull through the vertex without contrast.  Comparison: 09/21/2012  Findings: Atherosclerotic and physiologic intracranial calcifications.  Chronic opacification of the right maxillary sinus. Diffuse parenchymal atrophy. Patchy areas of hypoattenuation in deep and periventricular white matter bilaterally. Negative for acute intracranial hemorrhage, mass lesion, acute infarction, midline shift, or mass-effect. Acute infarct may be inapparent on noncontrast CT. Ventricles and sulci symmetric. Bone windows demonstrate no focal lesion.  IMPRESSION:  1. Negative for bleed or other acute intracranial process.  2. Atrophy and nonspecific white matter changes.  3.  Chronic right maxillary sinus disease.   Original Report Authenticated By: D. Andria Rhein, MD      1. Fall, initial encounter       MDM   79 year old female with history of atrial fibrillation on Coumadin who presented to the emergency department after a mechanical ground-level fall. Only other malady on exam with small  abrasion over left eyebrow. Otherwise patient asymptomatic without signs of trauma. Head CT performed given the patient is on Coumadin. Head CT negative for acute intracranial injury. Patient reassessed and at baseline. Basic labs with INR came back showing an INR mildly elevated in the 4 range. Recommend holding Coumadin tonight and tomorrow and following up with primary care physician in 2 days time for INR repeat. Otherwise patient safe for discharge. Patient will ambulate with walker. Encourage continued use of her walker while at home anytime not sitting or lying. Patient in the company of family including power of attorney. Patient and family updated on plan. Patient discharged.       Arloa Koh, MD 09/24/12 2316

## 2012-09-24 NOTE — ED Notes (Signed)
Per patient she was recently seen at [facility] ED for kidney infection. She has had frequent falls at home but denies feeling dizzy or weak during the falls. Pt states she just looses her balance. Denies visual changes. Denies SOB. NAD noted at this time.

## 2012-09-30 ENCOUNTER — Ambulatory Visit (INDEPENDENT_AMBULATORY_CARE_PROVIDER_SITE_OTHER): Payer: Medicare Other | Admitting: Pharmacist

## 2012-09-30 DIAGNOSIS — Z7901 Long term (current) use of anticoagulants: Secondary | ICD-10-CM

## 2012-09-30 DIAGNOSIS — I4891 Unspecified atrial fibrillation: Secondary | ICD-10-CM

## 2012-10-02 NOTE — ED Provider Notes (Signed)
I saw and evaluated the patient, reviewed the resident's note and I agree with the findings and plan.  88yF presenting after fall. No midline spinal tenderness. Neuro imaging reassuring.  Raeford Razor, MD 10/02/12 [phone]

## 2012-10-28 ENCOUNTER — Ambulatory Visit (INDEPENDENT_AMBULATORY_CARE_PROVIDER_SITE_OTHER): Payer: Medicare Other | Admitting: *Deleted

## 2012-10-28 DIAGNOSIS — I4891 Unspecified atrial fibrillation: Secondary | ICD-10-CM

## 2012-10-28 DIAGNOSIS — Z7901 Long term (current) use of anticoagulants: Secondary | ICD-10-CM

## 2012-11-19 ENCOUNTER — Telehealth: Payer: Self-pay | Admitting: Internal Medicine

## 2012-11-19 NOTE — Telephone Encounter (Signed)
Patient is interested in trying an anti-coag other than coumadin.  She states that the co-pay of $49 everytime she comes into the office for an INR is getting to be too much.  Please advise on alternative.

## 2012-11-19 NOTE — Telephone Encounter (Signed)
New Prob     Pt is requesting an alternative to warfarin (something cheaper). Would like to speak to nurse regarding this.

## 2012-11-23 NOTE — Telephone Encounter (Signed)
Will send to coumadin clinic but would consider Xarelto 15 mg

## 2012-11-24 NOTE — Telephone Encounter (Signed)
Spoke with pt.  She is going to check on the cost of Xarelto to see if it would be better for her.  She is also going to talk to her PCP to see if they are able to manage INRs so she will not have a specialty copay each visit.

## 2012-12-02 ENCOUNTER — Ambulatory Visit (INDEPENDENT_AMBULATORY_CARE_PROVIDER_SITE_OTHER): Payer: Medicare Other | Admitting: *Deleted

## 2012-12-02 DIAGNOSIS — Z7901 Long term (current) use of anticoagulants: Secondary | ICD-10-CM

## 2012-12-02 DIAGNOSIS — I4891 Unspecified atrial fibrillation: Secondary | ICD-10-CM

## 2012-12-02 LAB — POCT INR: INR: 2.8

## 2012-12-05 ENCOUNTER — Encounter (HOSPITAL_COMMUNITY): Payer: Self-pay | Admitting: Cardiology

## 2012-12-05 ENCOUNTER — Emergency Department (HOSPITAL_COMMUNITY): Payer: Medicare Other

## 2012-12-05 ENCOUNTER — Emergency Department (HOSPITAL_COMMUNITY)
Admission: EM | Admit: 2012-12-05 | Discharge: 2012-12-05 | Disposition: A | Discharge status: Home / Self Care | Payer: Medicare Other | Attending: Emergency Medicine | Admitting: Emergency Medicine

## 2012-12-05 DIAGNOSIS — Z9889 Other specified postprocedural states: Secondary | ICD-10-CM | POA: Insufficient documentation

## 2012-12-05 DIAGNOSIS — E1142 Type 2 diabetes mellitus with diabetic polyneuropathy: Secondary | ICD-10-CM | POA: Insufficient documentation

## 2012-12-05 DIAGNOSIS — I10 Essential (primary) hypertension: Secondary | ICD-10-CM | POA: Insufficient documentation

## 2012-12-05 DIAGNOSIS — K219 Gastro-esophageal reflux disease without esophagitis: Secondary | ICD-10-CM | POA: Insufficient documentation

## 2012-12-05 DIAGNOSIS — E042 Nontoxic multinodular goiter: Secondary | ICD-10-CM | POA: Insufficient documentation

## 2012-12-05 DIAGNOSIS — I4510 Unspecified right bundle-branch block: Secondary | ICD-10-CM | POA: Insufficient documentation

## 2012-12-05 DIAGNOSIS — E119 Type 2 diabetes mellitus without complications: Secondary | ICD-10-CM | POA: Insufficient documentation

## 2012-12-05 DIAGNOSIS — J309 Allergic rhinitis, unspecified: Secondary | ICD-10-CM | POA: Insufficient documentation

## 2012-12-05 DIAGNOSIS — Z7901 Long term (current) use of anticoagulants: Secondary | ICD-10-CM | POA: Insufficient documentation

## 2012-12-05 DIAGNOSIS — D649 Anemia, unspecified: Secondary | ICD-10-CM | POA: Insufficient documentation

## 2012-12-05 DIAGNOSIS — M1990 Unspecified osteoarthritis, unspecified site: Secondary | ICD-10-CM | POA: Insufficient documentation

## 2012-12-05 DIAGNOSIS — Z85820 Personal history of malignant melanoma of skin: Secondary | ICD-10-CM | POA: Insufficient documentation

## 2012-12-05 DIAGNOSIS — Z79899 Other long term (current) drug therapy: Secondary | ICD-10-CM | POA: Insufficient documentation

## 2012-12-05 DIAGNOSIS — Z8719 Personal history of other diseases of the digestive system: Secondary | ICD-10-CM | POA: Insufficient documentation

## 2012-12-05 DIAGNOSIS — Z8701 Personal history of pneumonia (recurrent): Secondary | ICD-10-CM | POA: Insufficient documentation

## 2012-12-05 DIAGNOSIS — M4800 Spinal stenosis, site unspecified: Secondary | ICD-10-CM | POA: Insufficient documentation

## 2012-12-05 DIAGNOSIS — I440 Atrioventricular block, first degree: Secondary | ICD-10-CM | POA: Insufficient documentation

## 2012-12-05 DIAGNOSIS — E669 Obesity, unspecified: Secondary | ICD-10-CM | POA: Insufficient documentation

## 2012-12-05 DIAGNOSIS — R0602 Shortness of breath: Secondary | ICD-10-CM | POA: Insufficient documentation

## 2012-12-05 DIAGNOSIS — I4891 Unspecified atrial fibrillation: Secondary | ICD-10-CM | POA: Insufficient documentation

## 2012-12-05 DIAGNOSIS — R079 Chest pain, unspecified: Secondary | ICD-10-CM

## 2012-12-05 DIAGNOSIS — F068 Other specified mental disorders due to known physiological condition: Secondary | ICD-10-CM | POA: Insufficient documentation

## 2012-12-05 DIAGNOSIS — Z881 Allergy status to other antibiotic agents status: Secondary | ICD-10-CM | POA: Insufficient documentation

## 2012-12-05 DIAGNOSIS — Z7982 Long term (current) use of aspirin: Secondary | ICD-10-CM | POA: Insufficient documentation

## 2012-12-05 DIAGNOSIS — E785 Hyperlipidemia, unspecified: Secondary | ICD-10-CM | POA: Insufficient documentation

## 2012-12-05 DIAGNOSIS — I2510 Atherosclerotic heart disease of native coronary artery without angina pectoris: Secondary | ICD-10-CM | POA: Insufficient documentation

## 2012-12-05 LAB — BASIC METABOLIC PANEL
BUN: 16 mg/dL (ref 6–23)
Chloride: 101 mEq/L (ref 96–112)
GFR calc non Af Amer: 50 mL/min — ABNORMAL LOW (ref >90)
Glucose, Bld: 108 mg/dL — ABNORMAL HIGH (ref 70–99)
Potassium: 4.1 mEq/L (ref 3.5–5.1)

## 2012-12-05 LAB — CBC
HCT: 41.6 % (ref 36.0–46.0)
Hemoglobin: 14.3 g/dL (ref 12.0–15.0)
MCHC: 34.4 g/dL (ref 30.0–36.0)

## 2012-12-05 LAB — POCT I-STAT TROPONIN I: Troponin i, poc: 0 ng/mL (ref 0.00–0.08)

## 2012-12-05 NOTE — ED Notes (Signed)
Pt reports left sided chest pain that started about 2 days ago. Reports that the pain radiates down her left side and into her back. Denies any n/v or SOB with the pain. Pain is worse with movement. Reports Dr. Tenny Craw is her cardiologist.

## 2012-12-05 NOTE — ED Provider Notes (Signed)
History     CSN: [id number]  Arrival date & time 12/05/12  1259   First MD Initiated Contact with Patient 12/05/12 1456      Chief Complaint  Patient presents with  . Chest Pain    (Consider location/radiation/quality/duration/timing/severity/associated sxs/prior treatment) HPI Comments: Patient presents via EMS with 2 day history of left-sided chest pain that comes and goes. It radiates to her left arm and back. Last a few minutes to a few seconds at a time. She denies having had this before. No nausea, vomiting, shortness of breath, cough or fever. She reports a history of CAD with one stent in her LAD. Stress test in December with inferior infarct. On coumadin for history of Afib. Denies any leg pain or swelling. Denies abdominal pain.  The history is provided by the patient and the EMS personnel.    Past Medical History  Diagnosis Date  . Allergic rhinitis   . Anemia     NOS chronic disease. Hgb 11.6gm% 04/14/2009  . CAD (coronary artery disease)     a. s/p DES mLAD 2005. b. Cutting balloon angioplasty for ISR 2006. c. LHC 08/2010 nonobstructive. d. Myoview 06/2012: small anteroapical infarct/mod inferolat wall infarct but no ischemia, EF 75%.  . Diabetes mellitus type II   . Diverticulitis of colon   . GERD (gastroesophageal reflux disease)   . Hyperlipidemia   . HTN (hypertension)   . DM neuropathy, type II diabetes mellitus   . IBS (irritable bowel syndrome)   . Spinal stenosis     djd lumbar  . Multinodular goiter   . Orthostatic hypotension   . Obesity     BMI-37  . Acute renal failure     due to dehydration-Sept 2009; Normal creat 1.2 on fu 05/13/2009  . Hx of colonoscopy   . Cancer     on nose and had it removed  . DEMENTIA     slight  . Osteoarthritis     lower back  . Pneumonia   . PAF (paroxysmal atrial fibrillation)     noted on event monitor. No correlation with episodes and symptoms  . Chronic anticoagulation   . RBBB   . 1St degree AV block      Past Surgical History  Procedure Laterality Date  . Cholecystectomy    . Abdominal hysterectomy    . Nasal sinus surgery      x2  . Bilateral arthroscopic knee surgery    . Stent surgery    . Coronary angioplasty      stent  . Carpel tunnel wrist rt      Family History  Problem Relation Age of Onset  . Breast cancer Sister   . Heart disease Brother   . Colon cancer Neg Hx     History  Substance Use Topics  . Smoking status: Never Smoker   . Smokeless tobacco: Never Used  . Alcohol Use: No    OB History   Grav Para Term Preterm Abortions TAB SAB Ect Mult Living                  Review of Systems  Constitutional: Negative for fever and activity change.  HENT: Negative for congestion and rhinorrhea.   Eyes: Negative for visual disturbance.  Respiratory: Positive for chest tightness and shortness of breath. Negative for cough.   Cardiovascular: Positive for chest pain.  Gastrointestinal: Negative for nausea, vomiting and abdominal pain.  Genitourinary: Negative for dysuria and hematuria.  Musculoskeletal: Negative for  back pain.  Skin: Negative for rash.  Neurological: Negative for dizziness, weakness and headaches.  A complete 10 system review of systems was obtained and all systems are negative except as noted in the HPI and PMH.    Allergies  Amoxicillin  Home Medications   Current Outpatient Rx  Name  Route  Sig  Dispense  Refill  . amLODipine (NORVASC) 2.5 MG tablet   Oral   Take 2.5 mg by mouth at bedtime.          Marland Kitchen aspirin 81 MG EC tablet   Oral   Take 81 mg by mouth daily.           . Calcium Carbonate-Vit D-Min 1200-1000 MG-UNIT CHEW   Oral   Chew 1 tablet by mouth daily.         Marland Kitchen gabapentin (NEURONTIN) 300 MG capsule   Oral   Take 600 mg by mouth 2 (two) times daily.          Marland Kitchen glipiZIDE (GLUCOTROL XL) 10 MG 24 hr tablet   Oral   Take 10 mg by mouth daily.         . Multiple Vitamin (MULITIVITAMIN WITH MINERALS) TABS    Oral   Take 1 tablet by mouth daily.         . nitroGLYCERIN (NITROSTAT) 0.4 MG SL tablet   Sublingual   Place 1 tablet (0.4 mg total) under the tongue every 5 (five) minutes as needed for chest pain (up to 3 doses).   25 tablet   4   . polyvinyl alcohol (LIQUIFILM TEARS) 1.4 % ophthalmic solution   Both Eyes   Place 2 drops into both eyes as needed (dry eyes.).         Marland Kitchen rosuvastatin (CRESTOR) 20 MG tablet   Oral   Take 20 mg by mouth daily.           . traMADol (ULTRAM) 50 MG tablet   Oral   Take 1 tablet (50 mg total) by mouth every 6 (six) hours as needed for pain.   20 tablet   0   . warfarin (COUMADIN) 5 MG tablet   Oral   Take 7.5-10 mg by mouth daily at 6 PM. Takes 1.5 tablets (7.5mg ) Tues-Sunday. Takes 2 tablets (10mg ) on Monday.           BP 128/64  Pulse 64  Temp(Src) 97.1 F (36.2 C) (Oral)  Resp 22  SpO2 97%  Physical Exam  Constitutional: She is oriented to person, place, and time. She appears well-developed and well-nourished. No distress.  HENT:  Head: Normocephalic and atraumatic.  Mouth/Throat: Oropharynx is clear and moist. No oropharyngeal exudate.  Eyes: Conjunctivae and EOM are normal. Pupils are equal, round, and reactive to light.  Neck: Normal range of motion. Neck supple.  Cardiovascular: Normal rate, regular rhythm and normal heart sounds.   No murmur heard. Equal radial, femoral, DP and PT pulses  Pulmonary/Chest: Effort normal and breath sounds normal. No respiratory distress. She exhibits no tenderness.  Chest pain not reproducible  Abdominal: Soft. Bowel sounds are normal. There is no tenderness. There is no rebound and no guarding.  Musculoskeletal: Normal range of motion. She exhibits no edema and no tenderness.  Neurological: She is alert and oriented to person, place, and time. No cranial nerve deficit. Coordination normal.  Equal grip strength bilaterally, cranial nerves 2-12 intact, 5 out of 5 strength throughout    ED  Course  Procedures (including  critical care time)  Labs Reviewed  BASIC METABOLIC PANEL - Abnormal; Notable for the following:    Glucose, Bld 108 (*)    GFR calc non Af Amer 50 (*)    GFR calc Af Amer 57 (*)    All other components within normal limits  PROTIME-INR - Abnormal; Notable for the following:    Prothrombin Time 28.6 (*)    INR 2.87 (*)    All other components within normal limits  CBC  POCT I-STAT TROPONIN I   Dg Chest 2 View  12/05/2012   *RADIOLOGY REPORT*  Clinical Data: Chest pain  CHEST - 2 VIEW  Comparison: 07/17/2012  Findings: Heart size and vascularity are normal.  Lungs are free of infiltrate or effusion.  Negative for mass or adenopathy.  IMPRESSION: No active cardiopulmonary abnormality.   Original Report Authenticated By: Janeece Riggers, M.D.     No diagnosis found.    MDM  Intermittent left-sided chest pain for the past 2 days, atypical for ACS. History of CAD with stents.  Minor changes on EKG previous. First degree AV block with RBBB. Troponin negative. INR therapeutic. Doubt PE. Equal upper extremity BP, no neuro deficits.  Doubt dissection.  Patient has been seen by cardiology. Admission was offered but patient refused. Cardiology will send repeat troponin the plan at discharge are negative.  Delta troponin negative. Patient remains chest pain-free. She'll be discharged per cardiology recommendations with scheduling for a stress test next week.   Date: 12/05/2012  Rate: 109   Rhythm: sinus tachycardia  QRS Axis: normal  Intervals: PR prolonged  ST/T Wave abnormalities: nonspecific ST/T changes  Conduction Disutrbances:first-degree A-V block   Narrative Interpretation: Septal T wave inversions  Old EKG Reviewed: changes noted         Glynn Octave, MD 12/05/12 1826

## 2012-12-05 NOTE — Consult Note (Signed)
History and Physical  Patient ID: [name] MRN: [id number], SOB: April 20, 1924 79 y.o. Date of Encounter: 12/05/2012, 4:03 PM  Primary Physician: Herb Grays, MD Primary Cardiologist:Paula Tenny Craw, MD  Chief Complaint: chest pain  HPI: 79 y.o. female w/ PMHx significant for CAD, hypertension, paroxsymal atrial fibrillation on chronic Warfarin therapy, h/o RBBB and 1st degree AV Block, GERD, and Diabetes Mellitus Type 2 who presented to [facility] on 12/05/2012 with complaints of chest pain over the past 2 days which radiates down her left chest and to her back.  She has had hospital admissions in the past for similar symptoms including left chest pain and back pain. She currently denies chest pain and is requesting to go home.  She is s/p cardiac catheterization originally in 2005 with DES to mid LAD (2005) and subsequent cutting balloon angioplasty due to ISR (2006).  Most recent catheterization was Feb 2012 demonstrating LAD 40%, patent stent, proximal and mid RCA 30% with EF 60%. She had a Myoview 07/17/12 with small anteroapical wall infarct and moderate inferolateral wall infarct, EF 75%.     Past Medical History  Diagnosis Date  . Allergic rhinitis   . Anemia     NOS chronic disease. Hgb 11.6gm% 04/14/2009  . CAD (coronary artery disease)     a. s/p DES mLAD 2005. b. Cutting balloon angioplasty for ISR 2006. c. LHC 08/2010 nonobstructive. d. Myoview 06/2012: small anteroapical infarct/mod inferolat wall infarct but no ischemia, EF 75%.  . Diabetes mellitus type II   . Diverticulitis of colon   . GERD (gastroesophageal reflux disease)   . Hyperlipidemia   . HTN (hypertension)   . DM neuropathy, type II diabetes mellitus   . IBS (irritable bowel syndrome)   . Spinal stenosis     djd lumbar  . Multinodular goiter   . Orthostatic hypotension   . Obesity     BMI-37  . Acute renal failure     due to dehydration-Sept 2009; Normal creat 1.2 on fu 05/13/2009  . Hx of  colonoscopy   . Cancer     on nose and had it removed  . DEMENTIA     slight  . Osteoarthritis     lower back  . Pneumonia   . PAF (paroxysmal atrial fibrillation)     noted on event monitor. No correlation with episodes and symptoms  . Chronic anticoagulation   . RBBB   . 1St degree AV block      Surgical History:  Past Surgical History  Procedure Laterality Date  . Cholecystectomy    . Abdominal hysterectomy    . Nasal sinus surgery      x2  . Bilateral arthroscopic knee surgery    . Stent surgery    . Coronary angioplasty      stent  . Carpel tunnel wrist rt       Home Meds: Prior to Admission medications   Medication Sig Start Date End Date Taking? Authorizing Provider  amLODipine (NORVASC) 2.5 MG tablet Take 2.5 mg by mouth at bedtime.    Yes Historical Provider, MD  aspirin 81 MG EC tablet Take 81 mg by mouth daily.     Yes Historical Provider, MD  Calcium Carbonate-Vit D-Min 1200-1000 MG-UNIT CHEW Chew 1 tablet by mouth daily.   Yes Historical Provider, MD  gabapentin (NEURONTIN) 300 MG capsule Take 600 mg by mouth 2 (two) times daily.    Yes Historical Provider, MD  glipiZIDE (GLUCOTROL XL)  10 MG 24 hr tablet Take 10 mg by mouth daily.   Yes Historical Provider, MD  Multiple Vitamin (MULITIVITAMIN WITH MINERALS) TABS Take 1 tablet by mouth daily.   Yes Historical Provider, MD  nitroGLYCERIN (NITROSTAT) 0.4 MG SL tablet Place 1 tablet (0.4 mg total) under the tongue every 5 (five) minutes as needed for chest pain (up to 3 doses). 07/18/12  Yes Dayna N Dunn, PA-C  polyvinyl alcohol (LIQUIFILM TEARS) 1.4 % ophthalmic solution Place 2 drops into both eyes as needed (dry eyes.).   Yes Historical Provider, MD  rosuvastatin (CRESTOR) 20 MG tablet Take 20 mg by mouth daily.     Yes Historical Provider, MD  traMADol (ULTRAM) 50 MG tablet Take 1 tablet (50 mg total) by mouth every 6 (six) hours as needed for pain. 09/21/12  Yes Shari A Upstill, PA-C  warfarin (COUMADIN) 5 MG  tablet Take 7.5-10 mg by mouth daily at 6 PM. Takes 1.5 tablets (7.5mg ) Tues-Sunday. Takes 2 tablets (10mg ) on Monday.   Yes Historical Provider, MD    Allergies:  Allergies  Allergen Reactions  . Amoxicillin Other (See Comments)    UNKNOWN    History   Social History  . Marital Status: Married    Spouse Name: N/A    Number of Children: N/A  . Years of Education: N/A   Occupational History  . retired    Social History Main Topics  . Smoking status: Never Smoker   . Smokeless tobacco: Never Used  . Alcohol Use: No  . Drug Use: No  . Sexually Active: No   Other Topics Concern  . Not on file   Social History Narrative  . No narrative on file     Family History  Problem Relation Age of Onset  . Breast cancer Sister   . Heart disease Brother   . Colon cancer Neg Hx     Review of Systems: General: negative for chills, fever, night sweats or weight changes.  ENT: negative for rhinorrhea or epistaxis Cardiovascular: ++chest pain, denies shortness of breath, dyspnea on exertion, edema, orthopnea, palpitations, or paroxysmal nocturnal dyspnea Respiratory: negative for cough or wheezing GI: negative for nausea, vomiting, diarrhea, bright red blood per rectum, melena, or hematemesis GU: no hematuria, urgency, or frequency Musculoskeletal: negative for joint pain or swelling, negative for myalgias All other systems reviewed and are otherwise negative except as noted above.  Physical Exam: Blood pressure 128/64, pulse 64, temperature 97.1 F (36.2 C), temperature source Oral, resp. rate 22, SpO2 97.00%. General: Well developed, well nourished, White female, alert and oriented, in no acute distress, stating that she is "ready to go home" HEENT: Normocephalic, atraumatic, sclera non-icteric  Neck: Supple. Carotids 2+ without bruits. JVP normal Chest: + mild tenderness to palpation  Lungs: Clear bilaterally to auscultation without wheezes, rales, or rhonchi. Breathing is  unlabored. Heart: RRR with normal S1 and S2. No murmurs, rubs, or gallops appreciated. Abdomen: Soft, obese, non-tender with normoactive bowel sounds.  Back: No CVA tenderness Msk:  Strength and tone appear normal for age. Extremities: No clubbing, cyanosis, or edema.  Distal pedal pulses are 2+ and equal bilaterally. Neuro: CNII-XII intact, moves all extremities spontaneously. Psych:  Responds to questions appropriately with a normal affect.   Labs:   Lab Results  Component Value Date   WBC 7.8 12/05/2012   HGB 14.3 12/05/2012   HCT 41.6 12/05/2012   MCV 90.4 12/05/2012   PLT 210 12/05/2012    Recent Labs Lab 12/05/12  1308  NA 140  K 4.1  CL 101  CO2 30  BUN 16  CREATININE 1.00  CALCIUM 10.0  GLUCOSE 108*   No results found for this basename: CKTOTAL, CKMB, TROPONINI,  in the last 72 hours Lab Results  Component Value Date   CHOL 126 07/18/2012   HDL 66 07/18/2012   LDLCALC 45 07/18/2012   TRIG 77 07/18/2012   Lab Results  Component Value Date   DDIMER 0.80* 07/17/2012    Radiology/Studies:  Dg Chest 2 View  12/05/2012   *RADIOLOGY REPORT*  Clinical Data: Chest pain  CHEST - 2 VIEW  Comparison: 07/17/2012  Findings: Heart size and vascularity are normal.  Lungs are free of infiltrate or effusion.  Negative for mass or adenopathy.  IMPRESSION: No active cardiopulmonary abnormality.   Original Report Authenticated By: Janeece Riggers, M.D.     EKG: 109 bpm, wide QRS, RBBB, QTc 492, c/w prior tracing 09/24/2012  ASSESSMENT AND PLAN:  See attending note.  Victorino Dike, KAREN 12/05/2012, 4:03 PM  Patient seen with resident, agree with the above note.   79 yo with history of CAD presents with atypical chest pain.  Episodes last for about 2 minutes, occur at random, and then resolve.  She has been having chest pain on and off for 2-3 days.  It does not feel like prior ischemic pain to her.  She is insistent upon going home.  Initial troponin was normal and ECG was  unchanged (NSR, RBBB, 1st degree AV block).   - Repeat cardiac enzymes at 4 hours.  If negative, she can go home.  - If she goes home, she will need an outpatient stress test.  Will arrange for early next week.  - Continue her home cardiac regimen.   Marca Ancona 12/05/2012 4:39 PM

## 2012-12-08 ENCOUNTER — Encounter: Payer: Self-pay | Admitting: Physician Assistant

## 2012-12-08 ENCOUNTER — Ambulatory Visit (INDEPENDENT_AMBULATORY_CARE_PROVIDER_SITE_OTHER): Payer: Medicare Other | Admitting: Physician Assistant

## 2012-12-08 VITALS — BP 114/70 | HR 84 | Temp 97.6°F | Resp 18 | Ht 61.5 in | Wt 182.0 lb

## 2012-12-08 DIAGNOSIS — E119 Type 2 diabetes mellitus without complications: Secondary | ICD-10-CM | POA: Insufficient documentation

## 2012-12-08 DIAGNOSIS — E1149 Type 2 diabetes mellitus with other diabetic neurological complication: Secondary | ICD-10-CM

## 2012-12-08 DIAGNOSIS — R202 Paresthesia of skin: Secondary | ICD-10-CM

## 2012-12-08 DIAGNOSIS — M810 Age-related osteoporosis without current pathological fracture: Secondary | ICD-10-CM

## 2012-12-08 DIAGNOSIS — I2510 Atherosclerotic heart disease of native coronary artery without angina pectoris: Secondary | ICD-10-CM

## 2012-12-08 DIAGNOSIS — N393 Stress incontinence (female) (male): Secondary | ICD-10-CM

## 2012-12-08 DIAGNOSIS — R209 Unspecified disturbances of skin sensation: Secondary | ICD-10-CM

## 2012-12-08 DIAGNOSIS — E785 Hyperlipidemia, unspecified: Secondary | ICD-10-CM | POA: Insufficient documentation

## 2012-12-08 DIAGNOSIS — I10 Essential (primary) hypertension: Secondary | ICD-10-CM

## 2012-12-08 DIAGNOSIS — D649 Anemia, unspecified: Secondary | ICD-10-CM

## 2012-12-08 LAB — LIPID PANEL
HDL: 61 mg/dL (ref >39)
LDL Cholesterol: 60 mg/dL (ref 0–99)
Triglycerides: 59 mg/dL (ref <150)
VLDL: 12 mg/dL (ref 0–40)

## 2012-12-08 LAB — COMPLETE METABOLIC PANEL WITH GFR
ALT: 22 U/L (ref 0–35)
AST: 26 U/L (ref 0–37)
Alkaline Phosphatase: 50 U/L (ref 39–117)
Creat: 0.82 mg/dL (ref 0.50–1.10)
Total Bilirubin: 0.5 mg/dL (ref 0.3–1.2)

## 2012-12-08 LAB — CBC WITH DIFFERENTIAL/PLATELET
Basophils Absolute: 0 10*3/uL (ref 0.0–0.1)
Basophils Relative: 1 % (ref 0–1)
Eosinophils Absolute: 0.1 10*3/uL (ref 0.0–0.7)
MCHC: 34.9 g/dL (ref 30.0–36.0)
Monocytes Absolute: 0.7 10*3/uL (ref 0.1–1.0)
Neutro Abs: 4.5 10*3/uL (ref 1.7–7.7)
Neutrophils Relative %: 69 % (ref 43–77)
RDW: 14 % (ref 11.5–15.5)

## 2012-12-08 LAB — HEMOGLOBIN A1C
Hgb A1c MFr Bld: 5.8 % — ABNORMAL HIGH (ref <5.7)
Mean Plasma Glucose: 120 mg/dL — ABNORMAL HIGH (ref <117)

## 2012-12-08 MED ORDER — OXYBUTYNIN CHLORIDE ER 10 MG PO TB24: 10.0000 mg | ORAL_TABLET | Freq: Every day | ORAL | Status: DC

## 2012-12-08 MED ORDER — GABAPENTIN 600 MG PO TABS: 600.0000 mg | ORAL_TABLET | Freq: Three times a day (TID) | ORAL | Status: DC

## 2012-12-08 MED ORDER — ALENDRONATE SODIUM 70 MG PO TABS: 70.0000 mg | ORAL_TABLET | ORAL | Status: DC

## 2012-12-08 NOTE — Progress Notes (Signed)
Patient ID: KYNZLI REASE MRN: [id number], DOB: 12/09/1923, 79 y.o. Date of Encounter: @DATE @  Chief Complaint:  Chief Complaint  Patient presents with  . new pt est care    c/o crawling feeling on neck    HPI: 79 y.o. year old female  presents to transfer her care to this clinic. She had been going to [facility]. LOV there was one month ago per pt. But she says it has "been a while since had any lab work done." I asked if there were any other doctors that she sees routinely. She sees her Cardiologist and also sees Dr.Beane "for her neck and her low back."   Today she reports that for about 2 months she has had this sensation of "something crawling on her left neck." Saw Dr. Yehuda Budd about this one month ago. Treated with Diclofenac Gel (On Coumadin) and Baclofen. Pt says this really did not help. She has had no rash or skin lesions in the area.    Also, she needs refill on Fosamax. Says she has been on this <5 years-proably 2-3 years per pt.   Also, she says she "needs something to tak e for her bladder." Says she can be sitting down and have no sensation that she needs to urinate. When she stands up, urine comes out. She wears a pad all the time. Also leaks urine when she coughs, sneezes.   HLD: On Crestor. No myalgias or other adv effects.    Past Medical History  Diagnosis Date  . Allergic rhinitis   . Anemia     NOS chronic disease. Hgb 11.6gm% 04/14/2009  . CAD (coronary artery disease)     a. s/p DES mLAD 2005. b. Cutting balloon angioplasty for ISR 2006. c. LHC 08/2010 nonobstructive. d. Myoview 06/2012: small anteroapical infarct/mod inferolat wall infarct but no ischemia, EF 75%.  . Diverticulitis of colon   . GERD (gastroesophageal reflux disease)   . IBS (irritable bowel syndrome)   . Spinal stenosis     djd lumbar  . Multinodular goiter   . Orthostatic hypotension   . Obesity     BMI-37  . Acute renal failure     due to dehydration-Sept 2009; Normal creat 1.2  on fu 05/13/2009  . Hx of colonoscopy   . Cancer     on nose and had it removed  . DEMENTIA     slight  . Osteoarthritis     lower back  . Pneumonia   . PAF (paroxysmal atrial fibrillation)     noted on event monitor. No correlation with episodes and symptoms  . Chronic anticoagulation   . RBBB   . 1St degree AV block   . Diabetes mellitus type II   . DM neuropathy, type II diabetes mellitus   . Hyperlipidemia   . HTN (hypertension)      Home Meds: See attached medication section for current medication list. Any medications entered into computer today will not appear on this note's list. The medications listed below were entered prior to today. Current Outpatient Prescriptions on File Prior to Visit  Medication Sig Dispense Refill  . amLODipine (NORVASC) 2.5 MG tablet Take 2.5 mg by mouth at bedtime.       Marland Kitchen aspirin 81 MG EC tablet Take 81 mg by mouth daily.        Marland Kitchen glipiZIDE (GLUCOTROL XL) 10 MG 24 hr tablet Take 10 mg by mouth daily.      . nitroGLYCERIN (NITROSTAT) 0.4  MG SL tablet Place 1 tablet (0.4 mg total) under the tongue every 5 (five) minutes as needed for chest pain (up to 3 doses).  25 tablet  4  . polyvinyl alcohol (LIQUIFILM TEARS) 1.4 % ophthalmic solution Place 2 drops into both eyes as needed (dry eyes.).      Marland Kitchen rosuvastatin (CRESTOR) 20 MG tablet Take 20 mg by mouth daily.        . traMADol (ULTRAM) 50 MG tablet Take 1 tablet (50 mg total) by mouth every 6 (six) hours as needed for pain.  20 tablet  0  . warfarin (COUMADIN) 5 MG tablet Take 7.5-10 mg by mouth daily at 6 PM. Takes 1.5 tablets (7.5mg ) Tues-Sunday. Takes 2 tablets (10mg ) on Monday.       No current facility-administered medications on file prior to visit.    Allergies:  Allergies  Allergen Reactions  . Amoxicillin Other (See Comments)    UNKNOWN    History   Social History  . Marital Status: Widowed. [facility] Son stays with her at night.    Spouse Name: N/A    Number of Children: N/A   . Years of Education: N/A   Occupational History  . retired    Social History Main Topics  . Smoking status: Never Smoker   . Smokeless tobacco: Never Used  . Alcohol Use: No  . Drug Use: No  . Sexually Active: No   Other Topics Concern  . Not on file   Social History Narrative  . No narrative on file    Family History  Problem Relation Age of Onset  . Breast cancer Sister   . Heart disease Brother   . Colon cancer Neg Hx      Review of Systems:  See HPI for pertinent ROS. All other ROS negative.    Physical Exam: Blood pressure 114/70, pulse 84, temperature 97.6 F (36.4 C), temperature source Oral, resp. rate 18, height 5' 1.5" (1.562 m), weight 182 lb (82.555 kg)., Body mass index is 33.84 kg/(m^2). General:WNWD WF. Appears in no acute distress. Neck: Supple. No thyromegaly. No lymphadenopathy. Neck ROM is decreased (sees Ortho for problem with cervical spine). No rash, skin lesion.  Lungs: Clear bilaterally to auscultation without wheezes, rales, or rhonchi. Breathing is unlabored. Heart: RRR with S1 S2. No murmurs, rubs, or gallops. Abdomen: Soft, non-tender, non-distended with normoactive bowel sounds. No hepatomegaly. No rebound/guarding. No obvious abdominal masses. Musculoskeletal:  Strength and tone normal for age. Extremities/Skin: Warm and dry. No clubbing or cyanosis. No edema. No rashes or suspicious lesions. Neuro: Alert and oriented X 3. Moves all extremities spontaneously. Gait is normal. CNII-XII grossly in tact. Psych:  Responds to questions appropriately with a normal affect.     ASSESSMENT AND PLAN:  79 y.o. year old female with  1. Stress incontinence - oxybutynin (DITROPAN-XL) 10 MG 24 hr tablet; Take 1 tablet (10 mg total) by mouth daily. May increase to 2 daily after one week if symptoms not controlled. For urine incontinence.  Dispense: 60 tablet; Refill: 2 Will try this first since it is on formulary for her insurance. After on 20 mg, if  symptoms still not controlled, call me and we can cont to titrate up dose.   2. Osteoporosis, unspecified - alendronate (FOSAMAX) 70 MG tablet; Take 1 tablet (70 mg total) by mouth every 7 (seven) days. Take with a full glass of water on an empty stomach.  Dispense: 4 tablet; Refill: 11  3. Paresthesias Will  try increased dose of gabapentin for paresthesias in left neck.  - gabapentin (NEURONTIN) 600 MG tablet; Take 1 tablet (600 mg total) by mouth 3 (three) times daily.  Dispense: 90 tablet; Refill: 11  4. Type II or unspecified type diabetes mellitus with neurological manifestations, not stated as uncontrolled(250.60) - Hemoglobin A1c - COMPLETE METABOLIC PANEL WITH GFR - Microalbumin, urine - gabapentin (NEURONTIN) 600 MG tablet; Take 1 tablet (600 mg total) by mouth 3 (three) times daily.  Dispense: 90 tablet; Refill: 11  5. DIABETIC PERIPHERAL NEUROPATHY - Hemoglobin A1c - COMPLETE METABOLIC PANEL WITH GFR - Microalbumin, urine - gabapentin (NEURONTIN) 600 MG tablet; Take 1 tablet (600 mg total) by mouth 3 (three) times daily.  Dispense: 90 tablet; Refill: 11  6. HYPERTENSION BP at goal. Will review records to see why she is not on ACE Inh or ARB. She is on Norvasc. - COMPLETE METABOLIC PANEL WITH GFR  7. Hyperlipidemia On Crestor 20mg . Check Labs. She IS fasting. - COMPLETE METABOLIC PANEL WITH GFR - Lipid panel  8. ANEMIA-NOS - CBC with Differential  9. Coronary artery disease Per Cardiology.  ROV 3 months , sooner if needed.   [address] [place], [place], [facility] 12/08/2012 12:52 PM

## 2012-12-10 ENCOUNTER — Telehealth: Payer: Self-pay | Admitting: Cardiology

## 2012-12-10 NOTE — Telephone Encounter (Signed)
New problem    Need an order put into the system for nuclear stress test.   Attaching message from Cory Roughen wants pt to have a stress test on Tuesday. Can you please arrange and call the pt on Monday with the details?

## 2012-12-11 MED ORDER — GLIPIZIDE ER 5 MG PO TB24: 5.0000 mg | ORAL_TABLET | Freq: Every day | ORAL | Status: DC

## 2012-12-11 NOTE — Addendum Note (Signed)
Addended by: Elvina Mattes T on: 12/11/2012 11:54 AM   Modules accepted: Orders

## 2012-12-11 NOTE — Progress Notes (Signed)
Pt is aware of results and new medicatiion ordered and in pharmacy

## 2012-12-16 ENCOUNTER — Ambulatory Visit (HOSPITAL_COMMUNITY): Payer: Medicare Other | Attending: Cardiology | Admitting: Radiology

## 2012-12-16 VITALS — BP 130/53 | Ht 61.0 in | Wt 181.0 lb

## 2012-12-16 DIAGNOSIS — R0989 Other specified symptoms and signs involving the circulatory and respiratory systems: Secondary | ICD-10-CM | POA: Insufficient documentation

## 2012-12-16 DIAGNOSIS — R5383 Other fatigue: Secondary | ICD-10-CM | POA: Insufficient documentation

## 2012-12-16 DIAGNOSIS — R0602 Shortness of breath: Secondary | ICD-10-CM | POA: Insufficient documentation

## 2012-12-16 DIAGNOSIS — I10 Essential (primary) hypertension: Secondary | ICD-10-CM | POA: Insufficient documentation

## 2012-12-16 DIAGNOSIS — I491 Atrial premature depolarization: Secondary | ICD-10-CM

## 2012-12-16 DIAGNOSIS — E119 Type 2 diabetes mellitus without complications: Secondary | ICD-10-CM | POA: Insufficient documentation

## 2012-12-16 DIAGNOSIS — R079 Chest pain, unspecified: Secondary | ICD-10-CM

## 2012-12-16 DIAGNOSIS — I4510 Unspecified right bundle-branch block: Secondary | ICD-10-CM | POA: Insufficient documentation

## 2012-12-16 DIAGNOSIS — R002 Palpitations: Secondary | ICD-10-CM | POA: Insufficient documentation

## 2012-12-16 DIAGNOSIS — Z9861 Coronary angioplasty status: Secondary | ICD-10-CM | POA: Insufficient documentation

## 2012-12-16 DIAGNOSIS — R0609 Other forms of dyspnea: Secondary | ICD-10-CM | POA: Insufficient documentation

## 2012-12-16 DIAGNOSIS — R5381 Other malaise: Secondary | ICD-10-CM | POA: Insufficient documentation

## 2012-12-16 DIAGNOSIS — E785 Hyperlipidemia, unspecified: Secondary | ICD-10-CM | POA: Insufficient documentation

## 2012-12-16 DIAGNOSIS — I2510 Atherosclerotic heart disease of native coronary artery without angina pectoris: Secondary | ICD-10-CM

## 2012-12-16 MED ORDER — TECHNETIUM TC 99M SESTAMIBI GENERIC - CARDIOLITE
30.0000 | Freq: Once | INTRAVENOUS | Status: AC | PRN
  Administered 2012-12-16: 30 via INTRAVENOUS

## 2012-12-16 MED ORDER — REGADENOSON 0.4 MG/5ML IV SOLN
0.4000 mg | Freq: Once | INTRAVENOUS | Status: AC
  Administered 2012-12-16: 0.4 mg via INTRAVENOUS

## 2012-12-16 MED ORDER — TECHNETIUM TC 99M SESTAMIBI GENERIC - CARDIOLITE
10.0000 | Freq: Once | INTRAVENOUS | Status: AC | PRN
  Administered 2012-12-16: 10 via INTRAVENOUS

## 2012-12-16 NOTE — Progress Notes (Addendum)
[facility] SITE 3 NUCLEAR MED [address] [place], [place] [address] [phone]    Cardiology Nuclear Med Study  [name] is a 79 y.o. female     MRN : [id number]     DOB: 12/21/1923  Procedure Date: 12/16/2012  Nuclear Med Background Indication for Stress Test:  Evaluation for Ischemia and PTCA/Stent Patency History:  "05 Stents M LAD '06 Angioplasty HSRSA for 1 SR, PAF 2/12, Heart Cath: N/O CAD, 12/13 small anterior apical infarct, mod inferior lateral infarct (-) ischemia EF: 75% Cardiac Risk Factors: Hypertension, Lipids, NIDDM and RBBB  Symptoms:  DOE, Fatigue, Palpitations and SOB   Nuclear Pre-Procedure Caffeine/Decaff Intake:  None> 12 hrs NPO After: 8:30am   Lungs:  clear O2 Sat: 97% on room air. IV 0.9% NS with Angio Cath:  20g  IV Site: R Antecubital x 1, tolerated well IV Started by:  Irean Hong, RN  Chest Size (in):  40 Cup Size: DD  Height: 5\' 1"  (1.549 m)  Weight:  181 lb (82.101 kg)  BMI:  Body mass index is 34.22 kg/(m^2). Tech Comments: Took Glipizide this am; Norvasc last night    Nuclear Med Study 1 or 2 day study: 1 day  Stress Test Type:  Lexiscan  Reading MD: Olga Millers, MD  Order Authorizing Provider:  Marca Ancona, MD  Resting Radionuclide: Technetium 73m Sestamibi  Resting Radionuclide Dose: 11.0 mCi   Stress Radionuclide:  Technetium 10m Sestamibi  Stress Radionuclide Dose: 33.0 mCi           Stress Protocol Rest HR: 75 Stress HR: 105  Rest BP: 130/53 Stress BP: 144/76  Exercise Time (min): n/a METS: n/a   Predicted Max HR: 134 bpm % Max HR: 78.36 bpm Rate Pressure Product: 46962   Dose of Adenosine (mg):  n/a Dose of Lexiscan: 0.4 mg  Dose of Atropine (mg): n/a Dose of Dobutamine: n/a mcg/kg/min (at max HR)  Stress Test Technologist: Milana Na, EMT-P  Nuclear Technologist:  Domenic Polite, CNMT     Rest Procedure:  Myocardial perfusion imaging was performed at rest 45 minutes following the  intravenous administration of Technetium 38m Sestamibi. Rest ECG: Sinus with PACS and mobitz 1 second degree AV block; RBBB  Stress Procedure:  The patient received IV Lexiscan 0.4 mg over 15-seconds.  Technetium 72m Sestamibi injected at 30-seconds. This patient was warm and her head felt funny with the Lexiscan injection. This patient's head felt funny and was warm with the Lexiscan injection. Quantitative spect images were obtained after a 45 minute delay. Stress ECG: No significant ST segment change suggestive of ischemia.  QPS Raw Data Images:  Acquisition technically good; normal left ventricular size. Stress Images:  Normal homogeneous uptake in all areas of the myocardium. Rest Images:  Normal homogeneous uptake in all areas of the myocardium. Subtraction (SDS):  No evidence of ischemia. Transient Ischemic Dilatation (Normal <1.22):  0.95 Lung/Heart Ratio (Normal <0.45):  0.34  Quantitative Gated Spect Images QGS EDV:  66 ml QGS ESV:  19 ml  Impression Exercise Capacity:  Lexiscan with no exercise. BP Response:  Normal blood pressure response. Clinical Symptoms:  No chest pain or dyspnea ECG Impression:  No significant ST segment change suggestive of ischemia. Comparison with Prior Nuclear Study: No images to compare  Overall Impression:  Normal stress nuclear study.  LV Ejection Fraction: 71%.  LV Wall Motion:  NL LV Function; NL Wall Motion  [name]  Normal study.  Please inform patient.  Marca Ancona 12/17/2012 12:18 PM

## 2012-12-18 ENCOUNTER — Telehealth: Payer: Self-pay | Admitting: Internal Medicine

## 2012-12-18 NOTE — Telephone Encounter (Signed)
New problem    Test result  

## 2012-12-18 NOTE — Progress Notes (Signed)
Left message on machine for pt to contact the office.   

## 2012-12-18 NOTE — Progress Notes (Signed)
Pt.notified

## 2012-12-19 ENCOUNTER — Ambulatory Visit: Payer: Self-pay | Admitting: Physician Assistant

## 2012-12-22 ENCOUNTER — Encounter: Payer: Self-pay | Admitting: Physician Assistant

## 2012-12-22 ENCOUNTER — Ambulatory Visit (INDEPENDENT_AMBULATORY_CARE_PROVIDER_SITE_OTHER): Payer: Medicare Other | Admitting: Physician Assistant

## 2012-12-22 VITALS — BP 132/80 | HR 60 | Temp 97.8°F | Resp 18 | Ht 62.5 in | Wt 183.0 lb

## 2012-12-22 DIAGNOSIS — G5602 Carpal tunnel syndrome, left upper limb: Secondary | ICD-10-CM

## 2012-12-22 DIAGNOSIS — G5600 Carpal tunnel syndrome, unspecified upper limb: Secondary | ICD-10-CM

## 2012-12-22 NOTE — Progress Notes (Signed)
Patient ID: [name] MRN: [id number], DOB: 20-Jul-1924, 79 y.o. Date of Encounter: 12/22/2012, 10:54 AM    Chief Complaint:  Chief Complaint  Patient presents with  . c/o numbness in left hand  x long time    ? carpal tunnel  has same prob other hand     HPI: 79 y.o. year old female presents with c/o "asleep feeling" in left hand at times. Says she had same symtoms in right hand in past prior to having carpal tunnel surgery.  States she experiences this "asleep" sensation first thing in the morning and when she is driving/gripping steering wheel. Also, when in recliner doing crossward puzzles. It does not wake her from sleep. No other pain or tingling or true numbness. Feels the "asleep" sensation in all fingers of left hand. Radiates up into wrist.  Has a splint that she wears when driving and occasionally at other times but has had no relief with its use.   Home Meds: See attached medication section for any medications that were entered at today's visit. The computer does not put those onto this list.The following list is a list of meds entered prior to today's visit.   Current Outpatient Prescriptions on File Prior to Visit  Medication Sig Dispense Refill  . alendronate (FOSAMAX) 70 MG tablet Take 1 tablet (70 mg total) by mouth every 7 (seven) days. Take with a full glass of water on an empty stomach.  4 tablet  11  . amLODipine (NORVASC) 2.5 MG tablet Take 2.5 mg by mouth at bedtime.       Marland Kitchen aspirin 81 MG EC tablet Take 81 mg by mouth daily.        Marland Kitchen b complex vitamins tablet Take 1 tablet by mouth daily.      . baclofen (LIORESAL) 10 MG tablet Take 1 tablet by mouth as needed.      . Diclofenac Sodium 3 % GEL Apply topically at bedtime as needed.      . gabapentin (NEURONTIN) 600 MG tablet Take 1 tablet (600 mg total) by mouth 3 (three) times daily.  90 tablet  11  . glipiZIDE (GLUCOTROL XL) 5 MG 24 hr tablet Take 1 tablet (5 mg total) by mouth daily.  30 tablet  2  .  naproxen sodium (ANAPROX) 220 MG tablet Take 220 mg by mouth as needed.      . nitroGLYCERIN (NITROSTAT) 0.4 MG SL tablet Place 1 tablet (0.4 mg total) under the tongue every 5 (five) minutes as needed for chest pain (up to 3 doses).  25 tablet  4  . oxybutynin (DITROPAN-XL) 10 MG 24 hr tablet Take 1 tablet (10 mg total) by mouth daily. May increase to 2 daily after one week if symptoms not controlled. For urine incontinence.  60 tablet  2  . polyethylene glycol powder (GLYCOLAX/MIRALAX) powder Take 17 g by mouth daily as needed.      . polyvinyl alcohol (LIQUIFILM TEARS) 1.4 % ophthalmic solution Place 2 drops into both eyes as needed (dry eyes.).      Marland Kitchen rosuvastatin (CRESTOR) 20 MG tablet Take 20 mg by mouth daily.        . traMADol (ULTRAM) 50 MG tablet Take 1 tablet (50 mg total) by mouth every 6 (six) hours as needed for pain.  20 tablet  0  . TRUETEST TEST test strip 2 (two) times daily.      Marland Kitchen warfarin (COUMADIN) 5 MG tablet Take 7.5-10 mg by mouth  daily at 6 PM. Takes 1.5 tablets (7.5mg ) Tues-Sunday. Takes 2 tablets (10mg ) on Monday.       No current facility-administered medications on file prior to visit.    Allergies:  Allergies  Allergen Reactions  . Amoxicillin Other (See Comments)    UNKNOWN      Review of Systems: See HPI for pertinent ROS. All other ROS negative.    Physical Exam: Blood pressure 132/80, pulse 60, temperature 97.8 F (36.6 C), temperature source Oral, resp. rate 18, height 5' 2.5" (1.588 m), weight 183 lb (83.008 kg)., Body mass index is 32.92 kg/(m^2). General: WNWD WF. Appears in no acute distress. Lungs: Clear bilaterally to auscultation without wheezes, rales, or rhonchi. Breathing is unlabored. Heart: Regular rhythm. No murmurs, rubs, or gallops. Msk:  Strength and tone normal for age. Left Wrist: Inspection, ROM nml. Tinels is negative. Phalens is Positive. After just 20 seconds, develops "asleep " sensation. Extremities/Skin: Warm and dry. No  clubbing or cyanosis. No edema. No rashes or suspicious lesions. Neuro: Alert and oriented X 3. Moves all extremities spontaneously. Gait is normal. CNII-XII grossly in tact. Psych:  Responds to questions appropriately with a normal affect.     ASSESSMENT AND PLAN:  79 y.o. year old female with  1. Left carpal tunnel syndrome Cont wearing the splint. Will refer for evaluation of possible surgical release. Pt agreeable with this approach.  - Ambulatory referral to Orthopedic Surgery   Signed, Shon Hale [place], [place], [facility] 12/22/2012 10:54 AM

## 2012-12-23 ENCOUNTER — Other Ambulatory Visit: Payer: Self-pay | Admitting: Family Medicine

## 2012-12-23 DIAGNOSIS — E0590 Thyrotoxicosis, unspecified without thyrotoxic crisis or storm: Secondary | ICD-10-CM

## 2013-01-06 ENCOUNTER — Ambulatory Visit (INDEPENDENT_AMBULATORY_CARE_PROVIDER_SITE_OTHER): Payer: Medicare Other

## 2013-01-06 DIAGNOSIS — Z7901 Long term (current) use of anticoagulants: Secondary | ICD-10-CM

## 2013-01-06 DIAGNOSIS — I4891 Unspecified atrial fibrillation: Secondary | ICD-10-CM

## 2013-01-06 LAB — POCT INR: INR: 3

## 2013-01-12 ENCOUNTER — Telehealth: Payer: Self-pay | Admitting: Family Medicine

## 2013-01-12 NOTE — Telephone Encounter (Signed)
Pt wants something for her nerves.  I asked her if she was already taking something or had she been treated by provider for this before.  Her response was NO.  I asked her what was wrong with her nerves.  Her response was "What do you mean, what is wrong with my nerves?  I need something for my nerves!!"  I told patient as this was new problem she would have to make appt to see provider.  She then said I will see her Wednesday and abruptly hung up the phone.

## 2013-01-14 ENCOUNTER — Encounter: Payer: Self-pay | Admitting: Physician Assistant

## 2013-01-14 ENCOUNTER — Ambulatory Visit (INDEPENDENT_AMBULATORY_CARE_PROVIDER_SITE_OTHER): Payer: Medicare Other | Admitting: Physician Assistant

## 2013-01-14 VITALS — BP 140/72 | HR 76 | Temp 98.0°F | Resp 18 | Wt 184.0 lb

## 2013-01-14 DIAGNOSIS — R202 Paresthesia of skin: Secondary | ICD-10-CM

## 2013-01-14 DIAGNOSIS — R209 Unspecified disturbances of skin sensation: Secondary | ICD-10-CM

## 2013-01-14 NOTE — Progress Notes (Signed)
Patient ID: [name] MRN: [id number], DOB: Sep 27, 1923, 79 y.o. Date of Encounter: 01/14/2013, 2:47 PM    Chief Complaint:  Chief Complaint  Patient presents with  . c/o of pain in right wrist  and has crawling sensation in ne     HPI: 79 y.o. year old female is here for f/u of crawling sensation on left side of neck. I saw her for initial evaluation at this office on 12/08/12. At that visit she told me she had been experiencing sensation "of something crawling on her neck" for about 2 months. Saw Dr. Yehuda Budd about this about one month prior to the visit with me. She treated her with diclofenac gel and baclofen. Said those meds really didn't help. At the OV with me 5/14 I increased the dose of her neurontin.  Today she says she has been taking it as listed-600 mg TID. Says she has not noticed any improvement. Still feels crawling sensation left neck.   Also has noticed some soreness in right wrist. No fall. No trauma or injury.   Home Meds: See attached medication section for any medications that were entered at today's visit. The computer does not put those onto this list.The following list is a list of meds entered prior to today's visit.   Current Outpatient Prescriptions on File Prior to Visit  Medication Sig Dispense Refill  . alendronate (FOSAMAX) 70 MG tablet Take 1 tablet (70 mg total) by mouth every 7 (seven) days. Take with a full glass of water on an empty stomach.  4 tablet  11  . amLODipine (NORVASC) 2.5 MG tablet Take 2.5 mg by mouth at bedtime.       Marland Kitchen aspirin 81 MG EC tablet Take 81 mg by mouth daily.        Marland Kitchen b complex vitamins tablet Take 1 tablet by mouth daily.      . baclofen (LIORESAL) 10 MG tablet Take 1 tablet by mouth as needed.      . Diclofenac Sodium 3 % GEL Apply topically at bedtime as needed.      . gabapentin (NEURONTIN) 600 MG tablet Take 1 tablet (600 mg total) by mouth 3 (three) times daily.  90 tablet  11  . glipiZIDE (GLUCOTROL XL) 5 MG 24 hr  tablet Take 1 tablet (5 mg total) by mouth daily.  30 tablet  2  . naproxen sodium (ANAPROX) 220 MG tablet Take 220 mg by mouth as needed.      . nitroGLYCERIN (NITROSTAT) 0.4 MG SL tablet Place 1 tablet (0.4 mg total) under the tongue every 5 (five) minutes as needed for chest pain (up to 3 doses).  25 tablet  4  . oxybutynin (DITROPAN-XL) 10 MG 24 hr tablet Take 1 tablet (10 mg total) by mouth daily. May increase to 2 daily after one week if symptoms not controlled. For urine incontinence.  60 tablet  2  . polyethylene glycol powder (GLYCOLAX/MIRALAX) powder Take 17 g by mouth daily as needed.      . polyvinyl alcohol (LIQUIFILM TEARS) 1.4 % ophthalmic solution Place 2 drops into both eyes as needed (dry eyes.).      Marland Kitchen rosuvastatin (CRESTOR) 20 MG tablet Take 20 mg by mouth daily.        . traMADol (ULTRAM) 50 MG tablet Take 1 tablet (50 mg total) by mouth every 6 (six) hours as needed for pain.  20 tablet  0  . TRUETEST TEST test strip 2 (two) times daily.      Marland Kitchen  warfarin (COUMADIN) 5 MG tablet Take 7.5-10 mg by mouth daily at 6 PM. Takes 1.5 tablets (7.5mg ) Tues-Sunday. Takes 2 tablets (10mg ) on Monday.       No current facility-administered medications on file prior to visit.    Allergies:  Allergies  Allergen Reactions  . Amoxicillin Other (See Comments)    UNKNOWN      Review of Systems: See HPI for pertinent ROS. All other ROS negative.    Physical Exam: Blood pressure 140/72, pulse 76, temperature 98 F (36.7 C), temperature source Oral, resp. rate 18, weight 184 lb (83.462 kg)., Body mass index is 33.1 kg/(m^2). General:  Overweight WF. Appears in no acute distress. Neck: Supple. No thyromegaly. No lymphadenopathy. No carotid bruit. No tenderness with palopation of neck. No rash or skin lesions.  Lungs: Clear bilaterally to auscultation without wheezes, rales, or rhonchi. Breathing is unlabored. Heart: Regular rhythm. No murmurs, rubs, or gallops. Msk:  Strength and tone  normal for age. Extremities/Skin: Warm and dry.  No edema. No rashes or suspicious lesions. Right forearm, wrist; Normal on ispection. No echymosis. No edema. No apin with palpation. FROM of elbow, wrist.  Neuro: Alert and oriented X 3. Moves all extremities spontaneously. Gait is normal. CNII-XII grossly in tact. Psych:  Responds to questions appropriately with a normal affect.     ASSESSMENT AND PLAN:  79 y.o. year old female with  1. Paresthesia of left neck.  She is on max dose of neurontin. She has used voltaren gel and baclofen in past with no relief. I do not know of any other medications to offer her any symptom relief. Cont current neurontin.     Murray Hodgkins [place], [place], [facility] 01/14/2013 2:47 PM

## 2013-01-15 ENCOUNTER — Telehealth: Payer: Self-pay | Admitting: Internal Medicine

## 2013-01-15 NOTE — Telephone Encounter (Signed)
New Problem:    Patient called in because she is experiencing a tingling sensation in her neck that travels to the end of her ear and would like to know what that could be.  Please call back.

## 2013-01-15 NOTE — Telephone Encounter (Signed)
Advised pt to follow up with her ENT.  Pt agreed and said she would call them today.

## 2013-01-22 ENCOUNTER — Telehealth: Payer: Self-pay | Admitting: Physician Assistant

## 2013-01-22 MED ORDER — ROSUVASTATIN CALCIUM 20 MG PO TABS: 20.0000 mg | ORAL_TABLET | Freq: Every day | ORAL | Status: DC

## 2013-01-22 NOTE — Telephone Encounter (Signed)
Med refilled.

## 2013-01-26 ENCOUNTER — Other Ambulatory Visit: Payer: Self-pay | Admitting: Internal Medicine

## 2013-02-05 ENCOUNTER — Ambulatory Visit (INDEPENDENT_AMBULATORY_CARE_PROVIDER_SITE_OTHER): Payer: Medicare Other | Admitting: Family Medicine

## 2013-02-05 ENCOUNTER — Encounter: Payer: Self-pay | Admitting: Family Medicine

## 2013-02-05 VITALS — BP 110/56 | HR 86 | Temp 98.0°F | Resp 20 | Wt 181.0 lb

## 2013-02-05 DIAGNOSIS — R202 Paresthesia of skin: Secondary | ICD-10-CM

## 2013-02-05 DIAGNOSIS — E1149 Type 2 diabetes mellitus with other diabetic neurological complication: Secondary | ICD-10-CM

## 2013-02-05 DIAGNOSIS — R209 Unspecified disturbances of skin sensation: Secondary | ICD-10-CM

## 2013-02-05 LAB — COMPLETE METABOLIC PANEL WITH GFR
BUN: 16 mg/dL (ref 6–23)
CO2: 29 mEq/L (ref 19–32)
Calcium: 9.7 mg/dL (ref 8.4–10.5)
Chloride: 105 mEq/L (ref 96–112)
Creat: 1.08 mg/dL (ref 0.50–1.10)
GFR, Est African American: 54 mL/min — ABNORMAL LOW

## 2013-02-05 LAB — HEMOGLOBIN A1C
Hgb A1c MFr Bld: 5.8 % — ABNORMAL HIGH (ref <5.7)
Mean Plasma Glucose: 120 mg/dL — ABNORMAL HIGH (ref <117)

## 2013-02-05 NOTE — Progress Notes (Signed)
Subjective:    Patient ID: [name], female    DOB: 1924-03-25, 79 y.o.   MRN: [id number]  HPI Patient presents today with 2 major concerns. #1 she complains of a crawling, numbness, itchy feeling that radiates from her left neck to her left ear.  Her description matches paresthesia.  She is already taking gabapentin 600 mg 3 times a day for diabetic neuropathy.  She also reports some mild neck pain. She denies any paresthesias, dysesthesias, or weakness radiating down either arm she has no history of shingles in this area.  She also reports fasting blood sugars between 140 and 160. She is not regularly checking two-hour postprandial sugars. Her last hemoglobin A1c was 5.8 in May and well controlled. She denies any polyuria, polydipsia, or blurred vision. Past Medical History  Diagnosis Date  . Allergic rhinitis   . Anemia     NOS chronic disease. Hgb 11.6gm% 04/14/2009  . CAD (coronary artery disease)     a. s/p DES mLAD 2005. b. Cutting balloon angioplasty for ISR 2006. c. LHC 08/2010 nonobstructive. d. Myoview 06/2012: small anteroapical infarct/mod inferolat wall infarct but no ischemia, EF 75%.  . Diverticulitis of colon   . GERD (gastroesophageal reflux disease)   . IBS (irritable bowel syndrome)   . Spinal stenosis     djd lumbar  . Multinodular goiter   . Orthostatic hypotension   . Obesity     BMI-37  . Acute renal failure     due to dehydration-Sept 2009; Normal creat 1.2 on fu 05/13/2009  . Hx of colonoscopy   . Cancer     on nose and had it removed  . DEMENTIA     slight  . Osteoarthritis     lower back  . Pneumonia   . PAF (paroxysmal atrial fibrillation)     noted on event monitor. No correlation with episodes and symptoms  . Chronic anticoagulation   . RBBB   . 1St degree AV block   . Diabetes mellitus type II   . DM neuropathy, type II diabetes mellitus   . Hyperlipidemia   . HTN (hypertension)     Current Outpatient Prescriptions on File Prior to  Visit  Medication Sig Dispense Refill  . alendronate (FOSAMAX) 70 MG tablet Take 1 tablet (70 mg total) by mouth every 7 (seven) days. Take with a full glass of water on an empty stomach.  4 tablet  11  . amLODipine (NORVASC) 2.5 MG tablet Take 2.5 mg by mouth at bedtime.       Marland Kitchen aspirin 81 MG EC tablet Take 81 mg by mouth daily.        Marland Kitchen b complex vitamins tablet Take 1 tablet by mouth daily.      . baclofen (LIORESAL) 10 MG tablet Take 1 tablet by mouth as needed.      . Diclofenac Sodium 3 % GEL Apply topically at bedtime as needed.      . gabapentin (NEURONTIN) 600 MG tablet Take 1 tablet (600 mg total) by mouth 3 (three) times daily.  90 tablet  11  . glipiZIDE (GLUCOTROL XL) 5 MG 24 hr tablet Take 1 tablet (5 mg total) by mouth daily.  30 tablet  2  . naproxen sodium (ANAPROX) 220 MG tablet Take 220 mg by mouth as needed.      . nitroGLYCERIN (NITROSTAT) 0.4 MG SL tablet Place 1 tablet (0.4 mg total) under the tongue every 5 (five) minutes as needed for chest  pain (up to 3 doses).  25 tablet  4  . oxybutynin (DITROPAN-XL) 10 MG 24 hr tablet Take 1 tablet (10 mg total) by mouth daily. May increase to 2 daily after one week if symptoms not controlled. For urine incontinence.  60 tablet  2  . polyethylene glycol powder (GLYCOLAX/MIRALAX) powder Take 17 g by mouth daily as needed.      . polyvinyl alcohol (LIQUIFILM TEARS) 1.4 % ophthalmic solution Place 2 drops into both eyes as needed (dry eyes.).      Marland Kitchen rosuvastatin (CRESTOR) 20 MG tablet Take 1 tablet (20 mg total) by mouth daily.  30 tablet  2  . traMADol (ULTRAM) 50 MG tablet Take 1 tablet (50 mg total) by mouth every 6 (six) hours as needed for pain.  20 tablet  0  . TRUETEST TEST test strip 2 (two) times daily.      Marland Kitchen warfarin (COUMADIN) 5 MG tablet Take 7.5-10 mg by mouth daily at 6 PM. Takes 1.5 tablets (7.5mg ) Tues-Sunday. Takes 2 tablets (10mg ) on Monday.       No current facility-administered medications on file prior to visit.    Allergies  Allergen Reactions  . Amoxicillin Other (See Comments)    UNKNOWN   History   Social History  . Marital Status: Married    Spouse Name: N/A    Number of Children: N/A  . Years of Education: N/A   Occupational History  . retired    Social History Main Topics  . Smoking status: Never Smoker   . Smokeless tobacco: Never Used  . Alcohol Use: No  . Drug Use: No  . Sexually Active: No   Other Topics Concern  . Not on file   Social History Narrative  . No narrative on file    Review of Systems  All other systems reviewed and are negative.       Objective:   Physical Exam  Vitals reviewed. Constitutional: She is oriented to person, place, and time. She appears well-developed and well-nourished.  Neck: Neck supple. No JVD present. No thyromegaly present.  Cardiovascular: Normal rate and normal heart sounds.   Pulmonary/Chest: Effort normal and breath sounds normal.  Abdominal: Soft. Bowel sounds are normal.  Lymphadenopathy:    She has no cervical adenopathy.  Neurological: She is alert and oriented to person, place, and time. She has normal reflexes. She displays normal reflexes. No cranial nerve deficit. She exhibits normal muscle tone. Coordination normal.  Skin: Skin is warm. No rash noted. No erythema. No pallor.          Assessment & Plan:  1. Paresthesia Her description sounds like notalgia paresthetica.  I will rule out thyroid, B12 deficiency, and uremia as a cause of her paresthesia.  I reassured the patient that this coming from I nerve irritation. At the present time there is no evidence of a cervical radiculopathy. Therefore I recommended clinical monitoring. - TSH - Vitamin B12  2. Type II or unspecified type diabetes mellitus with neurological manifestations, not stated as uncontrolled(250.60) Check hemoglobin A1c. If less than 7, I would not treat with more medication given her age and multiple medical comorbidities. - COMPLETE  METABOLIC PANEL WITH GFR - Hemoglobin A1c

## 2013-02-17 ENCOUNTER — Ambulatory Visit (INDEPENDENT_AMBULATORY_CARE_PROVIDER_SITE_OTHER): Payer: Medicare Other | Admitting: *Deleted

## 2013-02-17 DIAGNOSIS — I4891 Unspecified atrial fibrillation: Secondary | ICD-10-CM

## 2013-02-17 DIAGNOSIS — Z7901 Long term (current) use of anticoagulants: Secondary | ICD-10-CM

## 2013-02-26 ENCOUNTER — Ambulatory Visit (INDEPENDENT_AMBULATORY_CARE_PROVIDER_SITE_OTHER): Payer: Medicare Other | Admitting: Family Medicine

## 2013-02-26 ENCOUNTER — Encounter: Payer: Self-pay | Admitting: Family Medicine

## 2013-02-26 VITALS — BP 138/74 | HR 80 | Temp 98.8°F | Resp 18 | Wt 183.0 lb

## 2013-02-26 DIAGNOSIS — L309 Dermatitis, unspecified: Secondary | ICD-10-CM

## 2013-02-26 DIAGNOSIS — R209 Unspecified disturbances of skin sensation: Secondary | ICD-10-CM

## 2013-02-26 DIAGNOSIS — R202 Paresthesia of skin: Secondary | ICD-10-CM

## 2013-02-26 DIAGNOSIS — L259 Unspecified contact dermatitis, unspecified cause: Secondary | ICD-10-CM

## 2013-02-26 MED ORDER — PREGABALIN 75 MG PO CAPS: 75.0000 mg | ORAL_CAPSULE | Freq: Three times a day (TID) | ORAL | Status: DC

## 2013-02-26 MED ORDER — TRIAMCINOLONE ACETONIDE 0.1 % EX CREA: TOPICAL_CREAM | Freq: Two times a day (BID) | CUTANEOUS | Status: DC

## 2013-02-26 NOTE — Progress Notes (Signed)
Subjective:    Patient ID: [name], female    DOB: 03-16-1924, 79 y.o.   MRN: [id number]  HPI Patient presents with pain around her left ear. She continues to complain of paresthesias and dysesthesias radiating from the lateral left side of her neck up to her jaw and on to her face. It has been that way for years. However now it seems to be progressing to the surface of the ear. She describes it as an itch or burning sensation. Gabapentin does not seem to be helping. She was taking gabapentin for her diabetic neuropathy. She also complains of pain on the surface of the earlobe. There is a red scaly papular rash on the surface of the earlobe. There are 2 discrete papules or possibly 4 mm in diameter. They did not appear to be shingles. There are no lesions inside the ear canal. There is no evidence of otitis externa or an ear infection in the tympanic membrane Past Medical History  Diagnosis Date  . Allergic rhinitis   . Anemia     NOS chronic disease. Hgb 11.6gm% 04/14/2009  . CAD (coronary artery disease)     a. s/p DES mLAD 2005. b. Cutting balloon angioplasty for ISR 2006. c. LHC 08/2010 nonobstructive. d. Myoview 06/2012: small anteroapical infarct/mod inferolat wall infarct but no ischemia, EF 75%.  . Diverticulitis of colon   . GERD (gastroesophageal reflux disease)   . IBS (irritable bowel syndrome)   . Spinal stenosis     djd lumbar  . Multinodular goiter   . Orthostatic hypotension   . Obesity     BMI-37  . Acute renal failure     due to dehydration-Sept 2009; Normal creat 1.2 on fu 05/13/2009  . Hx of colonoscopy   . Cancer     on nose and had it removed  . DEMENTIA     slight  . Osteoarthritis     lower back  . Pneumonia   . PAF (paroxysmal atrial fibrillation)     noted on event monitor. No correlation with episodes and symptoms  . Chronic anticoagulation   . RBBB   . 1St degree AV block   . Diabetes mellitus type II   . DM neuropathy, type II diabetes  mellitus   . Hyperlipidemia   . HTN (hypertension)    Current Outpatient Prescriptions on File Prior to Visit  Medication Sig Dispense Refill  . alendronate (FOSAMAX) 70 MG tablet Take 1 tablet (70 mg total) by mouth every 7 (seven) days. Take with a full glass of water on an empty stomach.  4 tablet  11  . amLODipine (NORVASC) 2.5 MG tablet Take 2.5 mg by mouth at bedtime.       Marland Kitchen aspirin 81 MG EC tablet Take 81 mg by mouth daily.        Marland Kitchen b complex vitamins tablet Take 1 tablet by mouth daily.      . baclofen (LIORESAL) 10 MG tablet Take 1 tablet by mouth as needed.      . Diclofenac Sodium 3 % GEL Apply topically at bedtime as needed.      . gabapentin (NEURONTIN) 600 MG tablet Take 1 tablet (600 mg total) by mouth 3 (three) times daily.  90 tablet  11  . glipiZIDE (GLUCOTROL XL) 5 MG 24 hr tablet Take 1 tablet (5 mg total) by mouth daily.  30 tablet  2  . naproxen sodium (ANAPROX) 220 MG tablet Take 220 mg by mouth as  needed.      . nitroGLYCERIN (NITROSTAT) 0.4 MG SL tablet Place 1 tablet (0.4 mg total) under the tongue every 5 (five) minutes as needed for chest pain (up to 3 doses).  25 tablet  4  . oxybutynin (DITROPAN-XL) 10 MG 24 hr tablet Take 1 tablet (10 mg total) by mouth daily. May increase to 2 daily after one week if symptoms not controlled. For urine incontinence.  60 tablet  2  . polyethylene glycol powder (GLYCOLAX/MIRALAX) powder Take 17 g by mouth daily as needed.      . polyvinyl alcohol (LIQUIFILM TEARS) 1.4 % ophthalmic solution Place 2 drops into both eyes as needed (dry eyes.).      Marland Kitchen rosuvastatin (CRESTOR) 20 MG tablet Take 1 tablet (20 mg total) by mouth daily.  30 tablet  2  . traMADol (ULTRAM) 50 MG tablet Take 1 tablet (50 mg total) by mouth every 6 (six) hours as needed for pain.  20 tablet  0  . TRUETEST TEST test strip 2 (two) times daily.      Marland Kitchen warfarin (COUMADIN) 5 MG tablet Take 7.5-10 mg by mouth daily at 6 PM. Takes 1.5 tablets (7.5mg ) Tues-Sunday. Takes  2 tablets (10mg ) on Monday.       No current facility-administered medications on file prior to visit.   Allergies  Allergen Reactions  . Amoxicillin Other (See Comments)    UNKNOWN   History   Social History  . Marital Status: Married    Spouse Name: N/A    Number of Children: N/A  . Years of Education: N/A   Occupational History  . retired    Social History Main Topics  . Smoking status: Never Smoker   . Smokeless tobacco: Never Used  . Alcohol Use: No  . Drug Use: No  . Sexually Active: No   Other Topics Concern  . Not on file   Social History Narrative  . No narrative on file      Review of Systems  All other systems reviewed and are negative.       Objective:   Physical Exam  Constitutional: She appears well-developed and well-nourished.  HENT:  Right Ear: External ear normal.  Left Ear: There is tenderness.  Nose: Nose normal.  Mouth/Throat: Oropharynx is clear and moist.  Neck: Normal range of motion. Neck supple. No JVD present. No thyromegaly present.  Cardiovascular: Normal rate, regular rhythm and normal heart sounds.   No murmur heard. Pulmonary/Chest: Effort normal and breath sounds normal. No respiratory distress. She has no wheezes.  Lymphadenopathy:    She has no cervical adenopathy.   2 discrete red papules 4 mm in size with white scale on the surface of the left earlobe. The left external auditory canal is normal. The left tympanic membrane is normal. There is no rashes or lesions on the neck        Assessment & Plan:  1. Dermatitis I prescribed triamcinolone 0.1% cream to be applied twice a day to the rash on her ear. It has an atopic dermatitis appearance. She is to apply the cream for 7 days.  She thinks the rash comes where she has been constantly scratching at the paresthesias on the surface of the ear and as a sign of an irritant dermatitis.  2. Notalgia paresthetica Discontinue gabapentin. Begin Lyrica 75 mg by mouth 3 times  a day. Recheck in 2 weeks.

## 2013-03-04 ENCOUNTER — Telehealth: Payer: Self-pay | Admitting: Family Medicine

## 2013-03-05 NOTE — Telephone Encounter (Signed)
lyrica was substituted for neurontin for neuropathy.

## 2013-03-08 ENCOUNTER — Other Ambulatory Visit: Payer: Self-pay | Admitting: Physician Assistant

## 2013-03-09 NOTE — Telephone Encounter (Signed)
Medication refilled per protocol. 

## 2013-03-16 ENCOUNTER — Ambulatory Visit (INDEPENDENT_AMBULATORY_CARE_PROVIDER_SITE_OTHER): Payer: Medicare Other | Admitting: Family Medicine

## 2013-03-16 ENCOUNTER — Encounter: Payer: Self-pay | Admitting: Family Medicine

## 2013-03-16 VITALS — BP 122/70 | HR 88 | Temp 98.3°F | Resp 18 | Wt 183.0 lb

## 2013-03-16 DIAGNOSIS — J029 Acute pharyngitis, unspecified: Secondary | ICD-10-CM

## 2013-03-16 MED ORDER — AZITHROMYCIN 250 MG PO TABS: ORAL_TABLET | ORAL | Status: DC

## 2013-03-16 NOTE — Progress Notes (Signed)
Subjective:    Patient ID: [name], female    DOB: February 04, 1924, 79 y.o.   MRN: [id number]  HPI Patient reports a four-day history of sore throat. She denies any fevers, chills, cough, rhinorrhea, sneezing, sinus pressure, nausea, vomiting, or diarrhea.  The sore throat is slowly getting better. She denies any dysphasia/odynophagia. Past Medical History  Diagnosis Date  . Allergic rhinitis   . Anemia     NOS chronic disease. Hgb 11.6gm% 04/14/2009  . CAD (coronary artery disease)     a. s/p DES mLAD 2005. b. Cutting balloon angioplasty for ISR 2006. c. LHC 08/2010 nonobstructive. d. Myoview 06/2012: small anteroapical infarct/mod inferolat wall infarct but no ischemia, EF 75%.  . Diverticulitis of colon   . GERD (gastroesophageal reflux disease)   . IBS (irritable bowel syndrome)   . Spinal stenosis     djd lumbar  . Multinodular goiter   . Orthostatic hypotension   . Obesity     BMI-37  . Acute renal failure     due to dehydration-Sept 2009; Normal creat 1.2 on fu 05/13/2009  . Hx of colonoscopy   . Cancer     on nose and had it removed  . DEMENTIA     slight  . Osteoarthritis     lower back  . Pneumonia   . PAF (paroxysmal atrial fibrillation)     noted on event monitor. No correlation with episodes and symptoms  . Chronic anticoagulation   . RBBB   . 1St degree AV block   . Diabetes mellitus type II   . DM neuropathy, type II diabetes mellitus   . Hyperlipidemia   . HTN (hypertension)    Past Surgical History  Procedure Laterality Date  . Cholecystectomy    . Abdominal hysterectomy    . Nasal sinus surgery      x2  . Bilateral arthroscopic knee surgery    . Stent surgery    . Coronary angioplasty      stent  . Carpel tunnel wrist rt     Current Outpatient Prescriptions on File Prior to Visit  Medication Sig Dispense Refill  . alendronate (FOSAMAX) 70 MG tablet Take 1 tablet (70 mg total) by mouth every 7 (seven) days. Take with a full glass of water on  an empty stomach.  4 tablet  11  . amLODipine (NORVASC) 2.5 MG tablet Take 2.5 mg by mouth at bedtime.       Marland Kitchen aspirin 81 MG EC tablet Take 81 mg by mouth daily.        Marland Kitchen b complex vitamins tablet Take 1 tablet by mouth daily.      . baclofen (LIORESAL) 10 MG tablet Take 1 tablet by mouth as needed.      . Diclofenac Sodium 3 % GEL Apply topically at bedtime as needed.      . gabapentin (NEURONTIN) 600 MG tablet Take 1 tablet (600 mg total) by mouth 3 (three) times daily.  90 tablet  11  . glipiZIDE (GLUCOTROL XL) 5 MG 24 hr tablet TAKE 1 TABLET BY MOUTH EVERY DAY  30 tablet  4  . naproxen sodium (ANAPROX) 220 MG tablet Take 220 mg by mouth as needed.      . nitroGLYCERIN (NITROSTAT) 0.4 MG SL tablet Place 1 tablet (0.4 mg total) under the tongue every 5 (five) minutes as needed for chest pain (up to 3 doses).  25 tablet  4  . oxybutynin (DITROPAN-XL) 10 MG 24 hr  tablet Take 1 tablet (10 mg total) by mouth daily. May increase to 2 daily after one week if symptoms not controlled. For urine incontinence.  60 tablet  2  . polyethylene glycol powder (GLYCOLAX/MIRALAX) powder Take 17 g by mouth daily as needed.      . polyvinyl alcohol (LIQUIFILM TEARS) 1.4 % ophthalmic solution Place 2 drops into both eyes as needed (dry eyes.).      Marland Kitchen pregabalin (LYRICA) 75 MG capsule Take 1 capsule (75 mg total) by mouth 3 (three) times daily.  90 capsule  1  . rosuvastatin (CRESTOR) 20 MG tablet Take 1 tablet (20 mg total) by mouth daily.  30 tablet  2  . traMADol (ULTRAM) 50 MG tablet Take 1 tablet (50 mg total) by mouth every 6 (six) hours as needed for pain.  20 tablet  0  . triamcinolone cream (KENALOG) 0.1 % Apply topically 2 (two) times daily.  30 g  0  . TRUETEST TEST test strip 2 (two) times daily.      Marland Kitchen warfarin (COUMADIN) 5 MG tablet Take 7.5-10 mg by mouth daily at 6 PM. Takes 1.5 tablets (7.5mg ) Tues-Sunday. Takes 2 tablets (10mg ) on Monday.       No current facility-administered medications on file  prior to visit.   Allergies  Allergen Reactions  . Amoxicillin Other (See Comments)    UNKNOWN   History   Social History  . Marital Status: Married    Spouse Name: N/A    Number of Children: N/A  . Years of Education: N/A   Occupational History  . retired    Social History Main Topics  . Smoking status: Never Smoker   . Smokeless tobacco: Never Used  . Alcohol Use: No  . Drug Use: No  . Sexual Activity: No   Other Topics Concern  . Not on file   Social History Narrative  . No narrative on file      Review of Systems  All other systems reviewed and are negative.       Objective:   Physical Exam  Vitals reviewed. Constitutional: She appears well-developed and well-nourished.  HENT:  Right Ear: External ear normal.  Left Ear: External ear normal.  Nose: Nose normal.  Mouth/Throat: Oropharynx is clear and moist. No oropharyngeal exudate.  Eyes: Conjunctivae and EOM are normal. Pupils are equal, round, and reactive to light.  Neck: Neck supple. No thyromegaly present.  Cardiovascular: Normal rate and normal heart sounds.   Pulmonary/Chest: Effort normal and breath sounds normal.  Abdominal: Soft. Bowel sounds are normal. She exhibits no distension. There is no tenderness. There is no rebound and no guarding.  Lymphadenopathy:    She has no cervical adenopathy.          Assessment & Plan:  1. Acute pharyngitis I explained to the patient that this is likely a viral pharyngitis. Her exam is completely benign. I recommended tincture of time. I recommended over-the-counter Chloraseptic Spray as needed for sore throat. I anticipate self limited resolution in 3-4 days. If symptoms worsen she can get a Z-Pak but she will need her Coumadin checked 1 week afterwards. I recommended she not fill the prescription unless symptoms worsen dramatically.

## 2013-03-17 ENCOUNTER — Ambulatory Visit (INDEPENDENT_AMBULATORY_CARE_PROVIDER_SITE_OTHER): Payer: Medicare Other | Admitting: *Deleted

## 2013-03-17 ENCOUNTER — Telehealth: Payer: Self-pay | Admitting: Family Medicine

## 2013-03-17 DIAGNOSIS — I4891 Unspecified atrial fibrillation: Secondary | ICD-10-CM

## 2013-03-17 DIAGNOSIS — Z7901 Long term (current) use of anticoagulants: Secondary | ICD-10-CM

## 2013-03-17 LAB — POCT INR: INR: 3.1

## 2013-03-17 NOTE — Telephone Encounter (Signed)
Patient aware.

## 2013-03-17 NOTE — Telephone Encounter (Signed)
Stop gabapentin.

## 2013-04-02 ENCOUNTER — Ambulatory Visit (INDEPENDENT_AMBULATORY_CARE_PROVIDER_SITE_OTHER): Payer: Medicare Other | Admitting: Internal Medicine

## 2013-04-02 ENCOUNTER — Encounter: Payer: Self-pay | Admitting: Internal Medicine

## 2013-04-02 VITALS — BP 144/81 | HR 80 | Ht 62.5 in | Wt 160.0 lb

## 2013-04-02 DIAGNOSIS — I2510 Atherosclerotic heart disease of native coronary artery without angina pectoris: Secondary | ICD-10-CM

## 2013-04-02 NOTE — Progress Notes (Signed)
HPI Patient is an 79 yo with a history of CAD and PAF She is s/p DES to mid LAD in 2005. PTCA 2006.  In December she was admitted with CP Last myoview in Dec 2013 with small anteroapical infarct, moderate inferolateral infarct. No ischemia. LVEF 75%.   I saw her in Jan   She had some CP in May  Nuc scan was normal with  no ischemia  No dizziness  No SOB NO CP  Doing good   Labs in May LDL was 60, HDL 61  Allergies     Allergies  Allergen Reactions  . Amoxicillin Other (See Comments)    UNKNOWN    Current Outpatient Prescriptions  Medication Sig Dispense Refill  . alendronate (FOSAMAX) 70 MG tablet Take 1 tablet (70 mg total) by mouth every 7 (seven) days. Take with a full glass of water on an empty stomach.  4 tablet  11  . amLODipine (NORVASC) 2.5 MG tablet Take 2.5 mg by mouth at bedtime.       Marland Kitchen aspirin 81 MG EC tablet Take 81 mg by mouth daily.        . baclofen (LIORESAL) 10 MG tablet Take 1 tablet by mouth as needed.      . Calcium Carbonate-Vit D-Min (CALCIUM 1200 PO) Take by mouth daily.      Marland Kitchen glipiZIDE (GLUCOTROL XL) 5 MG 24 hr tablet TAKE 1 TABLET BY MOUTH EVERY DAY  30 tablet  4  . Multiple Vitamins-Minerals (CENTRUM SILVER PO) Take by mouth daily.      . naproxen sodium (ANAPROX) 220 MG tablet Take 220 mg by mouth as needed.      . nitroGLYCERIN (NITROSTAT) 0.4 MG SL tablet Place 1 tablet (0.4 mg total) under the tongue every 5 (five) minutes as needed for chest pain (up to 3 doses).  25 tablet  4  . oxybutynin (DITROPAN-XL) 10 MG 24 hr tablet Take 1 tablet (10 mg total) by mouth daily. May increase to 2 daily after one week if symptoms not controlled. For urine incontinence.  60 tablet  2  . polyethylene glycol powder (GLYCOLAX/MIRALAX) powder Take 17 g by mouth daily as needed.      . polyvinyl alcohol (LIQUIFILM TEARS) 1.4 % ophthalmic solution Place 2 drops into both eyes as needed (dry eyes.).      Marland Kitchen pregabalin (LYRICA) 75 MG capsule Take 1 capsule (75 mg total) by  mouth 3 (three) times daily.  90 capsule  1  . rosuvastatin (CRESTOR) 20 MG tablet Take 1 tablet (20 mg total) by mouth daily.  30 tablet  2  . traMADol (ULTRAM) 50 MG tablet Take 1 tablet (50 mg total) by mouth every 6 (six) hours as needed for pain.  20 tablet  0  . triamcinolone cream (KENALOG) 0.1 % Apply topically 2 (two) times daily.  30 g  0  . TRUETEST TEST test strip 2 (two) times daily.      Marland Kitchen warfarin (COUMADIN) 5 MG tablet Take 7.5-10 mg by mouth daily at 6 PM. Takes 1.5 tablets (7.5mg ) Tues-Sunday. Takes 2 tablets (10mg ) on Monday.       No current facility-administered medications for this visit.    Past Medical History  Diagnosis Date  . Allergic rhinitis   . Anemia     NOS chronic disease. Hgb 11.6gm% 04/14/2009  . CAD (coronary artery disease)     a. s/p DES mLAD 2005. b. Cutting balloon angioplasty for ISR 2006. c. LHC  08/2010 nonobstructive. d. Myoview 06/2012: small anteroapical infarct/mod inferolat wall infarct but no ischemia, EF 75%.  . Diverticulitis of colon   . GERD (gastroesophageal reflux disease)   . IBS (irritable bowel syndrome)   . Spinal stenosis     djd lumbar  . Multinodular goiter   . Orthostatic hypotension   . Obesity     BMI-37  . Acute renal failure     due to dehydration-Sept 2009; Normal creat 1.2 on fu 05/13/2009  . Hx of colonoscopy   . Cancer     on nose and had it removed  . DEMENTIA     slight  . Osteoarthritis     lower back  . Pneumonia   . PAF (paroxysmal atrial fibrillation)     noted on event monitor. No correlation with episodes and symptoms  . Chronic anticoagulation   . RBBB   . 1St degree AV block   . Diabetes mellitus type II   . DM neuropathy, type II diabetes mellitus   . Hyperlipidemia   . HTN (hypertension)     Past Surgical History  Procedure Laterality Date  . Cholecystectomy    . Abdominal hysterectomy    . Nasal sinus surgery      x2  . Bilateral arthroscopic knee surgery    . Stent surgery    .  Coronary angioplasty      stent  . Carpel tunnel wrist rt      Family History  Problem Relation Age of Onset  . Breast cancer Sister   . Heart disease Brother   . Colon cancer Neg Hx     History   Social History  . Marital Status: Married    Spouse Name: N/A    Number of Children: N/A  . Years of Education: N/A   Occupational History  . retired    Social History Main Topics  . Smoking status: Never Smoker   . Smokeless tobacco: Never Used  . Alcohol Use: No  . Drug Use: No  . Sexual Activity: No   Other Topics Concern  . Not on file   Social History Narrative  . No narrative on file    Review of Systems:  All systems reviewed.  They are negative to the above problem except as previously stated.  Vital Signs: BP 144/81  Pulse 80  Ht 5' 2.5" (1.588 m)  Wt 160 lb (72.576 kg)  BMI 28.78 kg/m2  Physical Exam Patient is in NAD HEENT:  Normocephalic, atraumatic. EOMI, PERRLA.  Neck: JVP is normal.  No bruits.  Lungs: clear to auscultation. No rales no wheezes.  Heart: Regular rate and rhythm. Normal S1, S2. No S3.   No significant murmurs. PMI not displaced.  Abdomen:  Supple, nontender. Normal bowel sounds. No masses. No hepatomegaly.  Extremities:   Good distal pulses throughout. No lower extremity edema.  Musculoskeletal :moving all extremities.  Neuro:   alert and oriented x3.  CN II-XII grossly intact.  EKG  Aflutter/afibi  80 bpm.  RBBB.   Assessment and Plan:  1.  CAD  No symptoms of angina  2.  Atrial fib/fultter.  Keep on warfarin  Check on other meds   3.  HL  Keep on crestor.    F/U in 8 months.

## 2013-04-02 NOTE — Patient Instructions (Addendum)
Your physician recommends that you continue on your current medications as directed. Please refer to the Current Medication list given to you today.  Your physician wants you to follow-up in: 8  Months with Dr. Tenny Craw.  You will receive a reminder letter in the mail two months in advance. If you don't receive a letter, please call our office to schedule the follow-up appointment.

## 2013-04-03 ENCOUNTER — Other Ambulatory Visit: Payer: Self-pay | Admitting: Family Medicine

## 2013-04-03 ENCOUNTER — Telehealth: Payer: Self-pay | Admitting: Family Medicine

## 2013-04-03 MED ORDER — ATORVASTATIN CALCIUM 40 MG PO TABS: 40.0000 mg | ORAL_TABLET | Freq: Every day | ORAL | Status: DC

## 2013-04-03 NOTE — Telephone Encounter (Signed)
Patient aware.

## 2013-04-03 NOTE — Telephone Encounter (Signed)
Pt is in the donut hole and cannot afford her lyrica or crestor. Is there something cheaper that we can switch her to?

## 2013-04-03 NOTE — Telephone Encounter (Signed)
There is nothing cheaper for lyrica other than the gabapentin she was on.  She could switch crestor to generic atorvastatin.  I will escribe the med.

## 2013-04-14 ENCOUNTER — Ambulatory Visit (INDEPENDENT_AMBULATORY_CARE_PROVIDER_SITE_OTHER): Payer: Medicare Other | Admitting: Pharmacist

## 2013-04-14 DIAGNOSIS — Z7901 Long term (current) use of anticoagulants: Secondary | ICD-10-CM

## 2013-04-14 DIAGNOSIS — I4891 Unspecified atrial fibrillation: Secondary | ICD-10-CM

## 2013-04-14 LAB — POCT INR: INR: 2.1

## 2013-04-15 ENCOUNTER — Telehealth: Payer: Self-pay | Admitting: Internal Medicine

## 2013-04-15 NOTE — Telephone Encounter (Signed)
Follow UP:  Pt states she is returning Debra's call.

## 2013-04-15 NOTE — Telephone Encounter (Signed)
Spoke with pt, she is going to call her insurance and find out if there is a preferred lab that she will not have to make a co-pay. She will let us know what she finds out.

## 2013-04-15 NOTE — Telephone Encounter (Signed)
Left message for pt to call.

## 2013-04-15 NOTE — Telephone Encounter (Signed)
New Problem:  Pt states  she has to pay $45 everytime she gets labs drawn and she cannot afford that. She wants to know if there are any other options. Please advise

## 2013-04-16 ENCOUNTER — Telehealth: Payer: Self-pay | Admitting: *Deleted

## 2013-04-16 MED ORDER — APIXABAN 5 MG PO TABS: 5.0000 mg | ORAL_TABLET | Freq: Two times a day (BID) | ORAL | Status: DC

## 2013-04-16 NOTE — Telephone Encounter (Signed)
[employer] RX approval for eliquis 5 mg tab bid PA #[id number], notified [facility] [place], [place]

## 2013-04-16 NOTE — Telephone Encounter (Signed)
Message     I contacted patient's insurance. Eliquis appears to be covered. $45 / month. She could switch from Coumadin.    Would check INR as stopping to confirm when to start.    Spoke with pt, she has an appt 05-12-13 with [facility], she would like to change to eliquis. She would like to get a sooner appt. Will forwarded to [facility] to call and schedule the pt.

## 2013-04-16 NOTE — Telephone Encounter (Signed)
Follow Up:  Pt states she is returning Debra's call. Pt states she wants [facility] Kidney to send her Rx to [facility] not [facility]

## 2013-04-16 NOTE — Telephone Encounter (Signed)
Spoke with pt, aware dosage will be decided when she comes for the [facility] visit and script will be provided at that time. Pt agreed with this plan.

## 2013-04-16 NOTE — Telephone Encounter (Signed)
Spoke with pt and made her aware to take coumadin today and Friday, hold Saturday and Sundays dose. Come in on Monday for INR check and if INR is <2.0 then she will be able to start Eliquis 5mg s BID. Rx E-Rx to [facility] per her request.

## 2013-04-17 ENCOUNTER — Telehealth: Payer: Self-pay | Admitting: Internal Medicine

## 2013-04-17 NOTE — Telephone Encounter (Signed)
Spoke with pt, samples and discount cards placed at the front desk for pt pick up.

## 2013-04-17 NOTE — Telephone Encounter (Signed)
Error

## 2013-04-17 NOTE — Telephone Encounter (Signed)
New Problem  Medication was expensive ($142) at [facility], request generic brand or an alternative.

## 2013-04-20 ENCOUNTER — Ambulatory Visit (INDEPENDENT_AMBULATORY_CARE_PROVIDER_SITE_OTHER): Payer: Medicare Other | Admitting: *Deleted

## 2013-04-20 DIAGNOSIS — Z7901 Long term (current) use of anticoagulants: Secondary | ICD-10-CM

## 2013-04-20 DIAGNOSIS — I4891 Unspecified atrial fibrillation: Secondary | ICD-10-CM

## 2013-04-20 LAB — CBC
MCHC: 34.1 g/dL (ref 30.0–36.0)
MCV: 90.6 fl (ref 78.0–100.0)
RBC: 4.63 Mil/uL (ref 3.87–5.11)

## 2013-04-20 LAB — BASIC METABOLIC PANEL
GFR: 51.54 mL/min — ABNORMAL LOW (ref >60.00)
Potassium: 4.5 mEq/L (ref 3.5–5.1)
Sodium: 139 mEq/L (ref 135–145)

## 2013-04-20 LAB — POCT INR: INR: 1.5

## 2013-04-20 NOTE — Patient Instructions (Addendum)
Pt was started on Eliquis 5mg  bid  for Atrial Fib on April 20 2013.    Reviewed patients medication list.  Pt is not  currently on any combined P-gp and strong CYP3A4 inhibitors/inducers (ketoconazole, traconazole, ritonavir, carbamazepine, phenytoin, rifampin, St. John's wort).  Reviewed labs.  SCr 1.1, Weight  82.18kg,  .  Dose  Is  appropriate based on lab findings   Hgb 14.3 and HCT42  A full discussion of the nature of anticoagulants has been carried out.  A benefit/risk analysis has been presented to the patient, so that they understand the justification for choosing anticoagulation with Eliquis at this time.  The need for compliance is stressed.  Pt is aware to take the medication twice daily.  Side effects of potential bleeding are discussed, including unusual colored urine or stools, coughing up blood or coffee ground emesis, nose bleeds or serious fall or head trauma.  Discussed signs and symptoms of stroke. The patient should avoid any OTC items containing aspirin or ibuprofen.  Avoid alcohol consumption.   Call if any signs of abnormal bleeding.  Discussed financial obligations and is able to obtain medication.  Next lab test test in 1 month.  04/22/2013 Talked with pt and instructed regarding Hgb and HCT and SrCr and that she is on the correct dose of Eliquis and to continue as she has been on and reviewed with her the time of her appt in one month and she stated understanding.

## 2013-05-18 ENCOUNTER — Ambulatory Visit (INDEPENDENT_AMBULATORY_CARE_PROVIDER_SITE_OTHER): Payer: Medicare Other | Admitting: *Deleted

## 2013-05-18 DIAGNOSIS — I4891 Unspecified atrial fibrillation: Secondary | ICD-10-CM

## 2013-05-18 DIAGNOSIS — Z7901 Long term (current) use of anticoagulants: Secondary | ICD-10-CM

## 2013-05-18 LAB — BASIC METABOLIC PANEL
CO2: 29 mEq/L (ref 19–32)
Calcium: 8.9 mg/dL (ref 8.4–10.5)
Creatinine, Ser: 1 mg/dL (ref 0.4–1.2)
GFR: 55.08 mL/min — ABNORMAL LOW (ref >60.00)
Sodium: 138 mEq/L (ref 135–145)

## 2013-05-18 LAB — CBC
Hemoglobin: 12.9 g/dL (ref 12.0–15.0)
Platelets: 182 10*3/uL (ref 150.0–400.0)
WBC: 4.9 10*3/uL (ref 4.5–10.5)

## 2013-05-18 NOTE — Progress Notes (Signed)
Pt was started on Eliquis 5mg  bid for Atrial Fib on September 29th 2014.    Reviewed patients medication list.  Pt is not  currently on any combined P-gp and strong CYP3A4 inhibitors/inducers (ketoconazole, traconazole, ritonavir, carbamazepine, phenytoin, rifampin, St. John's wort).  Reviewed labs.  SCr 1.0 , Weight 84.145kg,   Dose is appropriate  based on lab findings.   Hgb 12.9 and HCT 37.8  A full discussion of the nature of anticoagulants has been carried out.  A benefit/risk analysis has been presented to the patient, so that they understand the justification for choosing anticoagulation with Eliquis at this time.  The need for compliance is stressed.  Pt is aware to take the medication twice daily.  Side effects of potential bleeding are discussed, including unusual colored urine or stools, coughing up blood or coffee ground emesis, nose bleeds or serious fall or head trauma.  Discussed signs and symptoms of stroke. The patient should avoid any OTC items containing aspirin or ibuprofen.  Avoid alcohol consumption.   Call if any signs of abnormal bleeding.  Discussed financial obligations and instructed pt to call insurance company to be sure able to afford .  Next lab test test in 6 months.  Pt states she has had no problems in taking Eliquis no bleeding no new medications. States she is thinking about having surgery on left wrist and instructed to talk with her surgeon regarding being on Eliquis and to notify Dr Tenny Craw as well and she states understanding Appt made for 6 months  Called pt and informed of labs and that she is on correct dose of Eliquis and pt states understanding

## 2013-05-20 ENCOUNTER — Telehealth: Payer: Self-pay | Admitting: Internal Medicine

## 2013-05-20 ENCOUNTER — Other Ambulatory Visit: Payer: Self-pay

## 2013-05-20 DIAGNOSIS — Z1231 Encounter for screening mammogram for malignant neoplasm of breast: Secondary | ICD-10-CM

## 2013-05-20 NOTE — Telephone Encounter (Signed)
Spoke with pt, aware of normal labs 

## 2013-05-20 NOTE — Telephone Encounter (Signed)
Follow up    Pt is calling for her lab resulls.

## 2013-06-10 ENCOUNTER — Other Ambulatory Visit: Payer: Self-pay | Admitting: Physician Assistant

## 2013-06-12 ENCOUNTER — Encounter: Payer: Self-pay | Admitting: Cardiology

## 2013-06-12 ENCOUNTER — Ambulatory Visit (HOSPITAL_COMMUNITY): Payer: Medicare Other | Attending: Cardiology

## 2013-06-12 DIAGNOSIS — I10 Essential (primary) hypertension: Secondary | ICD-10-CM | POA: Insufficient documentation

## 2013-06-12 DIAGNOSIS — E119 Type 2 diabetes mellitus without complications: Secondary | ICD-10-CM | POA: Insufficient documentation

## 2013-06-12 DIAGNOSIS — I2510 Atherosclerotic heart disease of native coronary artery without angina pectoris: Secondary | ICD-10-CM | POA: Insufficient documentation

## 2013-06-12 DIAGNOSIS — E785 Hyperlipidemia, unspecified: Secondary | ICD-10-CM | POA: Insufficient documentation

## 2013-06-12 DIAGNOSIS — I6529 Occlusion and stenosis of unspecified carotid artery: Secondary | ICD-10-CM

## 2013-06-12 DIAGNOSIS — I658 Occlusion and stenosis of other precerebral arteries: Secondary | ICD-10-CM | POA: Insufficient documentation

## 2013-06-16 ENCOUNTER — Ambulatory Visit: Payer: Medicare Other | Admitting: Family Medicine

## 2013-06-22 ENCOUNTER — Ambulatory Visit
Admission: RE | Admit: 2013-06-22 | Discharge: 2013-06-22 | Disposition: A | Discharge status: Home / Self Care | Payer: Medicare Other | Source: Ambulatory Visit

## 2013-06-22 DIAGNOSIS — Z1231 Encounter for screening mammogram for malignant neoplasm of breast: Secondary | ICD-10-CM

## 2013-07-01 ENCOUNTER — Telehealth: Payer: Self-pay | Admitting: Internal Medicine

## 2013-07-01 NOTE — Telephone Encounter (Signed)
New Message  Pt called-- having surgery on her hand and asks will she have to come off of coumadin for the procedure// the date of surgery is not scheduled//

## 2013-07-01 NOTE — Telephone Encounter (Signed)
Called stating she is having surgery on her wrist sometime in January- carpel tunnel.  Wants to know if she will have to stop Coumadin.  She doesn't have an app until Jan 5 for consult.  She doesn't know the surgeons name.  Spoke w/Jeremy (Pharm) who states she will have to stop 3-4 days prior to procedure. Advised pt that she will have to stop so she will have to make sure the surgeon knows that she is on coumadin and they will schedule surgery appropriately.

## 2013-08-01 ENCOUNTER — Other Ambulatory Visit: Payer: Self-pay | Admitting: Physician Assistant

## 2013-10-12 ENCOUNTER — Encounter: Payer: Self-pay | Admitting: Interventional Cardiology

## 2013-11-16 ENCOUNTER — Ambulatory Visit (INDEPENDENT_AMBULATORY_CARE_PROVIDER_SITE_OTHER): Payer: Medicare HMO | Admitting: *Deleted

## 2013-11-16 DIAGNOSIS — Z7901 Long term (current) use of anticoagulants: Secondary | ICD-10-CM

## 2013-11-16 DIAGNOSIS — I4891 Unspecified atrial fibrillation: Secondary | ICD-10-CM

## 2013-11-16 LAB — BASIC METABOLIC PANEL
BUN: 18 mg/dL (ref 6–23)
CALCIUM: 9 mg/dL (ref 8.4–10.5)
CO2: 28 meq/L (ref 19–32)
CREATININE: 1 mg/dL (ref 0.4–1.2)
Chloride: 101 mEq/L (ref 96–112)
GFR: 56.3 mL/min — ABNORMAL LOW (ref >60.00)
Glucose, Bld: 115 mg/dL — ABNORMAL HIGH (ref 70–99)
Potassium: 3.8 mEq/L (ref 3.5–5.1)
SODIUM: 139 meq/L (ref 135–145)

## 2013-11-16 LAB — CBC
HEMATOCRIT: 42.3 % (ref 36.0–46.0)
Hemoglobin: 14.3 g/dL (ref 12.0–15.0)
MCHC: 33.9 g/dL (ref 30.0–36.0)
MCV: 94.9 fl (ref 78.0–100.0)
Platelets: 229 10*3/uL (ref 150.0–400.0)
RBC: 4.45 Mil/uL (ref 3.87–5.11)
RDW: 12.8 % (ref 11.5–14.6)
WBC: 7.8 10*3/uL (ref 4.5–10.5)

## 2013-11-16 NOTE — Progress Notes (Signed)
Pt was started on Eliquis for Afib on April 20, 2013.    Reviewed patients medication list.  Pt is not  currently on any combined P-gp and strong CYP3A4 inhibitors/inducers (ketoconazole, traconazole, ritonavir, carbamazepine, phenytoin, rifampin, St. John's wort).  Reviewed labs.  SCr 1.0, Weight 82 Kg. Age 50yrs.  Dose appropriate based on specified criteria.    Hgb and HCT Within Normal Limits  A full discussion of the nature of anticoagulants has been carried out.  A benefit/risk analysis has been presented to the patient, so that they understand the justification for choosing anticoagulation with Eliquis at this time.  The need for compliance is stressed.  Pt is aware to take the medication twice daily.  Side effects of potential bleeding are discussed, including unusual colored urine or stools, coughing up blood or coffee ground emesis, nose bleeds or serious fall or head trauma.  Discussed signs and symptoms of stroke. The patient should avoid any OTC items containing aspirin or ibuprofen.  Avoid alcohol consumption.   Call if any signs of abnormal bleeding.  Discussed financial obligations and resolved any difficulty in obtaining medication.  Next lab test test in 6 months.

## 2013-11-16 NOTE — Patient Instructions (Signed)
A full discussion of the nature of anticoagulants has been carried out.  A benefit/risk analysis has been presented to the patient, so that they understand the justification for choosing anticoagulation with Eliquis at this time.  The need for compliance is stressed.  Pt is aware to take the medication twice daily.  Side effects of potential bleeding are discussed, including unusual colored urine or stools, coughing up blood or coffee ground emesis, nose bleeds or serious fall or head trauma.  Discussed signs and symptoms of stroke. The patient should avoid any OTC items containing aspirin or ibuprofen.  Avoid alcohol consumption.   Call if any signs of abnormal bleeding.  Discussed financial obligations and resolved any difficulty in obtaining medication. # [id number].

## 2013-11-17 ENCOUNTER — Encounter: Payer: Self-pay | Admitting: Internal Medicine

## 2013-11-17 NOTE — Telephone Encounter (Signed)
This encounter was created in error - please disregard.

## 2013-11-17 NOTE — Telephone Encounter (Signed)
Follow up  ° ° ° °Returning call back to nurse  °

## 2013-12-07 ENCOUNTER — Ambulatory Visit
Admission: RE | Admit: 2013-12-07 | Discharge: 2013-12-07 | Disposition: A | Discharge status: Home / Self Care | Payer: Medicare HMO | Source: Ambulatory Visit | Attending: Internal Medicine | Admitting: Internal Medicine

## 2013-12-07 ENCOUNTER — Ambulatory Visit (INDEPENDENT_AMBULATORY_CARE_PROVIDER_SITE_OTHER): Payer: Medicare HMO | Admitting: Internal Medicine

## 2013-12-07 ENCOUNTER — Encounter: Payer: Self-pay | Admitting: Internal Medicine

## 2013-12-07 VITALS — BP 112/54 | HR 68 | Ht 63.0 in | Wt 180.0 lb

## 2013-12-07 DIAGNOSIS — R0781 Pleurodynia: Secondary | ICD-10-CM

## 2013-12-07 DIAGNOSIS — R071 Chest pain on breathing: Secondary | ICD-10-CM

## 2013-12-07 NOTE — Patient Instructions (Signed)
A chest x-ray takes a picture of the organs and structures inside the chest, including the heart, lungs, and blood vessels. This test can show several things, including, whether the heart is enlarges; whether fluid is building up in the lungs; and whether pacemaker / defibrillator leads are still in place.  Your physician wants you to follow-up in: [place]. You will receive a reminder letter in the mail two months in advance. If you don't receive a letter, please call our office to schedule the follow-up appointment.

## 2013-12-07 NOTE — Progress Notes (Signed)
HPI Patient is an 80 yo with a history of CAD and PAF She is s/p DES to mid LAD in 2005. PTCA 2006.  Patient was last in clinic in the fall  .  Patient notes occasional chest pains  More in evening  N0t assocated with acitivty Breating is OK   Myoview in may 2014 was normal  patinet does have pleuritic back pain. NO cough or fever.  Denies injury      Allergies     Allergies  Allergen Reactions  . Amoxicillin Other (See Comments)    UNKNOWN    Current Outpatient Prescriptions  Medication Sig Dispense Refill  . alendronate (FOSAMAX) 70 MG tablet Take 1 tablet (70 mg total) by mouth every 7 (seven) days. Take with a full glass of water on an empty stomach.  4 tablet  11  . amLODipine (NORVASC) 2.5 MG tablet Take 2.5 mg by mouth at bedtime.       Marland Kitchen apixaban (ELIQUIS) 5 MG TABS tablet Take 1 tablet (5 mg total) by mouth 2 (two) times daily.  60 tablet  6  . aspirin 81 MG EC tablet Take 81 mg by mouth daily.        . baclofen (LIORESAL) 10 MG tablet Take 1 tablet by mouth as needed.      . Calcium Carbonate-Vit D-Min (CALCIUM 1200 PO) Take by mouth daily.      Marland Kitchen gabapentin (NEURONTIN) 600 MG tablet 2 (two) times daily.       Marland Kitchen glipiZIDE (GLUCOTROL XL) 5 MG 24 hr tablet TAKE 1 TABLET BY MOUTH EVERY DAY  30 tablet  2  . Multiple Vitamins-Minerals (CENTRUM SILVER PO) Take by mouth daily.      . nitroGLYCERIN (NITROSTAT) 0.4 MG SL tablet Place 1 tablet (0.4 mg total) under the tongue every 5 (five) minutes as needed for chest pain (up to 3 doses).  25 tablet  4  . oxybutynin (DITROPAN-XL) 10 MG 24 hr tablet Take 1 tablet (10 mg total) by mouth daily. May increase to 2 daily after one week if symptoms not controlled. For urine incontinence.  60 tablet  2  . polyethylene glycol powder (GLYCOLAX/MIRALAX) powder Take 17 g by mouth daily as needed.      . polyvinyl alcohol (LIQUIFILM TEARS) 1.4 % ophthalmic solution Place 2 drops into both eyes as needed (dry eyes.).      Marland Kitchen pregabalin (LYRICA)  75 MG capsule Take 1 capsule (75 mg total) by mouth 3 (three) times daily.  90 capsule  1  . rosuvastatin (CRESTOR) 20 MG tablet Take 1 tablet (20 mg total) by mouth daily.  30 tablet  2  . tiZANidine (ZANAFLEX) 4 MG tablet       . traMADol (ULTRAM) 50 MG tablet Take 1 tablet (50 mg total) by mouth every 6 (six) hours as needed for pain.  20 tablet  0  . TRUETEST TEST test strip 2 (two) times daily.       No current facility-administered medications for this visit.    Past Medical History  Diagnosis Date  . Allergic rhinitis   . Anemia     NOS chronic disease. Hgb 11.6gm% 04/14/2009  . CAD (coronary artery disease)     a. s/p DES mLAD 2005. b. Cutting balloon angioplasty for ISR 2006. c. LHC 08/2010 nonobstructive. d. Myoview 06/2012: small anteroapical infarct/mod inferolat wall infarct but no ischemia, EF 75%.  . Diverticulitis of colon   . GERD (gastroesophageal reflux disease)   .  IBS (irritable bowel syndrome)   . Spinal stenosis     djd lumbar  . Multinodular goiter   . Orthostatic hypotension   . Obesity     BMI-37  . Acute renal failure     due to dehydration-Sept 2009; Normal creat 1.2 on fu 05/13/2009  . Hx of colonoscopy   . Cancer     on nose and had it removed  . DEMENTIA     slight  . Osteoarthritis     lower back  . Pneumonia   . PAF (paroxysmal atrial fibrillation)     noted on event monitor. No correlation with episodes and symptoms  . Chronic anticoagulation   . RBBB   . 1St degree AV block   . Diabetes mellitus type II   . DM neuropathy, type II diabetes mellitus   . Hyperlipidemia   . HTN (hypertension)     Past Surgical History  Procedure Laterality Date  . Cholecystectomy    . Abdominal hysterectomy    . Nasal sinus surgery      x2  . Bilateral arthroscopic knee surgery    . Stent surgery    . Coronary angioplasty      stent  . Carpel tunnel wrist rt      Family History  Problem Relation Age of Onset  . Breast cancer Sister   . Heart  disease Brother   . Colon cancer Neg Hx     History   Social History  . Marital Status: Married    Spouse Name: N/A    Number of Children: N/A  . Years of Education: N/A   Occupational History  . retired    Social History Main Topics  . Smoking status: Never Smoker   . Smokeless tobacco: Never Used  . Alcohol Use: No  . Drug Use: No  . Sexual Activity: No   Other Topics Concern  . Not on file   Social History Narrative  . No narrative on file    Review of Systems:  All systems reviewed.  They are negative to the above problem except as previously stated.  Vital Signs: BP 112/54  Pulse 68  Ht 5\' 3"  (1.6 m)  Wt 180 lb (81.647 kg)  BMI 31.89 kg/m2  Physical Exam Patient is in NAD HEENT:  Normocephalic, atraumatic. EOMI, PERRLA.  Neck: JVP is normal.  No bruits.  Lungs: clear to auscultation. No rales no wheezes.  Heart: Irregular rate and rhythm. Normal S1, S2. No S3.   No significant murmurs. PMI not displaced.  Abdomen:  Supple, nontender. Normal bowel sounds. No masses. No hepatomegaly.  Extremities:   Good distal pulses throughout. No lower extremity edema.  Musculoskeletal :moving all extremities. Back  nonntender Neuro:   alert and oriented x3.  CN II-XII grossly intact.  Assessment and Plan:  1.  CAD  I am not convinced of any active symptoms  She has a history of CP and spells she is having are atypical   Follow  2.  Atrial fib/fultter.  Keep on warfarin.  Rate control  3.  HL  Keep on crestor. Periodic lipids    4.  Back pain  Will get CXR  If negative consider referral to PT  F/U in 8 months.

## 2013-12-09 ENCOUNTER — Telehealth: Payer: Self-pay | Admitting: Internal Medicine

## 2013-12-09 NOTE — Telephone Encounter (Signed)
Spoke with pt, aware everything with the cxr was normal. She questioned why she is hurting. Explained not real sure and the pt replied I think I will be looking for another doctor and hung up the phone.

## 2013-12-09 NOTE — Telephone Encounter (Signed)
Patient would like results of her chest xray. Please call and advise.

## 2013-12-10 ENCOUNTER — Encounter: Payer: Self-pay | Admitting: Internal Medicine

## 2013-12-10 NOTE — Telephone Encounter (Signed)
This encounter was created in error - please disregard.

## 2013-12-10 NOTE — Telephone Encounter (Signed)
New message     Pt want to talk to the nurse she spoke to yesterday---she want to apologize.

## 2013-12-10 NOTE — Telephone Encounter (Signed)
Unable to reach pt or leave a message  

## 2013-12-31 ENCOUNTER — Telehealth: Payer: Self-pay | Admitting: Internal Medicine

## 2013-12-31 NOTE — Telephone Encounter (Signed)
New problem   Pt need to know if she can take Meloxicam 7.5mg  since she is taking Eliquis. Please advise.

## 2013-12-31 NOTE — Telephone Encounter (Signed)
Pt is aware that Meloxicam should not be used in conjunction with Eliquis. She received the prescription yesterday & has not filled it. She states that extra strength tylenol does work & will continue taking it. Horton Chin RN

## 2014-01-18 ENCOUNTER — Telehealth: Payer: Self-pay

## 2014-01-18 NOTE — Telephone Encounter (Signed)
Patient call for samples eliquis samples pleased samples up front

## 2014-03-03 ENCOUNTER — Telehealth: Payer: Self-pay | Admitting: *Deleted

## 2014-03-03 NOTE — Telephone Encounter (Signed)
Eliquis samples placed at the front desk for patient. 

## 2014-03-31 ENCOUNTER — Other Ambulatory Visit: Payer: Self-pay | Admitting: Obstetrics and Gynecology

## 2014-03-31 DIAGNOSIS — N644 Mastodynia: Secondary | ICD-10-CM

## 2014-04-07 ENCOUNTER — Other Ambulatory Visit: Payer: Commercial Managed Care - HMO

## 2014-04-08 ENCOUNTER — Ambulatory Visit
Admission: RE | Admit: 2014-04-08 | Discharge: 2014-04-08 | Disposition: A | Discharge status: Home / Self Care | Payer: Medicare HMO | Source: Ambulatory Visit | Attending: Obstetrics and Gynecology | Admitting: Obstetrics and Gynecology

## 2014-04-08 DIAGNOSIS — N644 Mastodynia: Secondary | ICD-10-CM

## 2014-05-05 ENCOUNTER — Emergency Department (HOSPITAL_COMMUNITY): Payer: Medicare HMO

## 2014-05-05 ENCOUNTER — Emergency Department (HOSPITAL_COMMUNITY)
Admission: EM | Admit: 2014-05-05 | Discharge: 2014-05-05 | Disposition: A | Discharge status: Home / Self Care | Payer: Medicare HMO | Attending: Emergency Medicine | Admitting: Emergency Medicine

## 2014-05-05 ENCOUNTER — Encounter (HOSPITAL_COMMUNITY): Payer: Self-pay | Admitting: Emergency Medicine

## 2014-05-05 DIAGNOSIS — Z79899 Other long term (current) drug therapy: Secondary | ICD-10-CM | POA: Insufficient documentation

## 2014-05-05 DIAGNOSIS — Z8701 Personal history of pneumonia (recurrent): Secondary | ICD-10-CM | POA: Insufficient documentation

## 2014-05-05 DIAGNOSIS — E119 Type 2 diabetes mellitus without complications: Secondary | ICD-10-CM | POA: Diagnosis not present

## 2014-05-05 DIAGNOSIS — Z8719 Personal history of other diseases of the digestive system: Secondary | ICD-10-CM | POA: Diagnosis not present

## 2014-05-05 DIAGNOSIS — I10 Essential (primary) hypertension: Secondary | ICD-10-CM | POA: Diagnosis not present

## 2014-05-05 DIAGNOSIS — E785 Hyperlipidemia, unspecified: Secondary | ICD-10-CM | POA: Insufficient documentation

## 2014-05-05 DIAGNOSIS — Z7902 Long term (current) use of antithrombotics/antiplatelets: Secondary | ICD-10-CM | POA: Diagnosis not present

## 2014-05-05 DIAGNOSIS — W1830XA Fall on same level, unspecified, initial encounter: Secondary | ICD-10-CM | POA: Diagnosis not present

## 2014-05-05 DIAGNOSIS — F0390 Unspecified dementia without behavioral disturbance: Secondary | ICD-10-CM | POA: Insufficient documentation

## 2014-05-05 DIAGNOSIS — Z859 Personal history of malignant neoplasm, unspecified: Secondary | ICD-10-CM | POA: Insufficient documentation

## 2014-05-05 DIAGNOSIS — Z862 Personal history of diseases of the blood and blood-forming organs and certain disorders involving the immune mechanism: Secondary | ICD-10-CM | POA: Diagnosis not present

## 2014-05-05 DIAGNOSIS — E669 Obesity, unspecified: Secondary | ICD-10-CM | POA: Diagnosis not present

## 2014-05-05 DIAGNOSIS — I2510 Atherosclerotic heart disease of native coronary artery without angina pectoris: Secondary | ICD-10-CM | POA: Diagnosis not present

## 2014-05-05 DIAGNOSIS — M1711 Unilateral primary osteoarthritis, right knee: Secondary | ICD-10-CM | POA: Insufficient documentation

## 2014-05-05 DIAGNOSIS — Z9861 Coronary angioplasty status: Secondary | ICD-10-CM | POA: Insufficient documentation

## 2014-05-05 DIAGNOSIS — Z8742 Personal history of other diseases of the female genital tract: Secondary | ICD-10-CM | POA: Insufficient documentation

## 2014-05-05 DIAGNOSIS — S8991XA Unspecified injury of right lower leg, initial encounter: Secondary | ICD-10-CM | POA: Diagnosis present

## 2014-05-05 DIAGNOSIS — R531 Weakness: Secondary | ICD-10-CM

## 2014-05-05 DIAGNOSIS — Z7982 Long term (current) use of aspirin: Secondary | ICD-10-CM | POA: Insufficient documentation

## 2014-05-05 DIAGNOSIS — Z880 Allergy status to penicillin: Secondary | ICD-10-CM | POA: Insufficient documentation

## 2014-05-05 DIAGNOSIS — M1990 Unspecified osteoarthritis, unspecified site: Secondary | ICD-10-CM | POA: Insufficient documentation

## 2014-05-05 DIAGNOSIS — Y9389 Activity, other specified: Secondary | ICD-10-CM | POA: Diagnosis not present

## 2014-05-05 DIAGNOSIS — Z7901 Long term (current) use of anticoagulants: Secondary | ICD-10-CM | POA: Diagnosis not present

## 2014-05-05 DIAGNOSIS — Z9989 Dependence on other enabling machines and devices: Secondary | ICD-10-CM | POA: Diagnosis not present

## 2014-05-05 DIAGNOSIS — Y92190 Kitchen in other specified residential institution as the place of occurrence of the external cause: Secondary | ICD-10-CM | POA: Insufficient documentation

## 2014-05-05 LAB — CBC
HCT: 41.4 % (ref 36.0–46.0)
Hemoglobin: 14.1 g/dL (ref 12.0–15.0)
MCH: 31.5 pg (ref 26.0–34.0)
MCHC: 34.1 g/dL (ref 30.0–36.0)
MCV: 92.4 fL (ref 78.0–100.0)
PLATELETS: 218 10*3/uL (ref 150–400)
RBC: 4.48 MIL/uL (ref 3.87–5.11)
RDW: 12.9 % (ref 11.5–15.5)
WBC: 6.4 10*3/uL (ref 4.0–10.5)

## 2014-05-05 LAB — COMPREHENSIVE METABOLIC PANEL
ALT: 19 U/L (ref 0–35)
AST: 25 U/L (ref 0–37)
Albumin: 3.9 g/dL (ref 3.5–5.2)
Alkaline Phosphatase: 51 U/L (ref 39–117)
Anion gap: 14 (ref 5–15)
BUN: 17 mg/dL (ref 6–23)
CHLORIDE: 100 meq/L (ref 96–112)
CO2: 27 meq/L (ref 19–32)
Calcium: 9.1 mg/dL (ref 8.4–10.5)
Creatinine, Ser: 1.02 mg/dL (ref 0.50–1.10)
GFR calc non Af Amer: 48 mL/min — ABNORMAL LOW (ref >90)
GFR, EST AFRICAN AMERICAN: 55 mL/min — AB (ref >90)
GLUCOSE: 104 mg/dL — AB (ref 70–99)
Potassium: 4 mEq/L (ref 3.7–5.3)
SODIUM: 141 meq/L (ref 137–147)
Total Bilirubin: 0.4 mg/dL (ref 0.3–1.2)
Total Protein: 7.3 g/dL (ref 6.0–8.3)

## 2014-05-05 LAB — URINALYSIS, ROUTINE W REFLEX MICROSCOPIC
BILIRUBIN URINE: NEGATIVE
Glucose, UA: NEGATIVE mg/dL
Hgb urine dipstick: NEGATIVE
Ketones, ur: NEGATIVE mg/dL
LEUKOCYTES UA: NEGATIVE
Nitrite: NEGATIVE
PH: 5.5 (ref 5.0–8.0)
Protein, ur: NEGATIVE mg/dL
SPECIFIC GRAVITY, URINE: 1.011 (ref 1.005–1.030)
Urobilinogen, UA: 0.2 mg/dL (ref 0.0–1.0)

## 2014-05-05 LAB — PROTIME-INR
INR: 1.13 (ref 0.00–1.49)
PROTHROMBIN TIME: 14.7 s (ref 11.6–15.2)

## 2014-05-05 LAB — DIFFERENTIAL
Basophils Absolute: 0.1 10*3/uL (ref 0.0–0.1)
Basophils Relative: 1 % (ref 0–1)
Eosinophils Absolute: 0.2 10*3/uL (ref 0.0–0.7)
Eosinophils Relative: 3 % (ref 0–5)
LYMPHS ABS: 1.5 10*3/uL (ref 0.7–4.0)
LYMPHS PCT: 23 % (ref 12–46)
Monocytes Absolute: 0.6 10*3/uL (ref 0.1–1.0)
Monocytes Relative: 9 % (ref 3–12)
NEUTROS ABS: 4.1 10*3/uL (ref 1.7–7.7)
NEUTROS PCT: 64 % (ref 43–77)

## 2014-05-05 LAB — APTT: APTT: 32 s (ref 24–37)

## 2014-05-05 LAB — I-STAT TROPONIN, ED: Troponin i, poc: 0 ng/mL (ref 0.00–0.08)

## 2014-05-05 NOTE — ED Notes (Signed)
Per EMS, patient had a fall while in the kitchen and complaining of right knee pain. Furthermore, patient has not been feeling good all day.

## 2014-05-05 NOTE — ED Notes (Signed)
Pt reports she was in the kitchen without her cane, fixing her supper. Pt reports she fell on her right knee. Denies any other injuries or pain anywhere else.

## 2014-05-05 NOTE — ED Notes (Signed)
PT in xray 

## 2014-05-05 NOTE — ED Provider Notes (Signed)
CSN: [id number]     Arrival date & time 05/05/14  2016 History   First MD Initiated Contact with Patient 05/05/14 2025     Chief Complaint  Patient presents with  . Fall  . Knee Pain    Right   HPI Patient presents to the emergency room after an episode of weakness at home. Patient states she's had some generalized malaise will stay. She was standing up in the kitchen return to make herself something to eat when she suddenly felt weak in both legs and fell to the ground. The patient landed on her right knee. She is now having pain in her knee. She was not able to get up do to her weakness and not the pain. She denies any trouble with any chest pain or shortness of breath. She is not having any trouble with abdominal pain. She has no unilateral weakness or numbness. She denies any trouble with her speech. She denies blood in her stool Past Medical History  Diagnosis Date  . Allergic rhinitis   . Anemia     NOS chronic disease. Hgb 11.6gm% 04/14/2009  . CAD (coronary artery disease)     a. s/p DES mLAD 2005. b. Cutting balloon angioplasty for ISR 2006. c. LHC 08/2010 nonobstructive. d. Myoview 06/2012: small anteroapical infarct/mod inferolat wall infarct but no ischemia, EF 75%.  . Diverticulitis of colon   . GERD (gastroesophageal reflux disease)   . IBS (irritable bowel syndrome)   . Spinal stenosis     djd lumbar  . Multinodular goiter   . Orthostatic hypotension   . Obesity     BMI-37  . Acute renal failure     due to dehydration-Sept 2009; Normal creat 1.2 on fu 05/13/2009  . Hx of colonoscopy   . Cancer     on nose and had it removed  . DEMENTIA     slight  . Osteoarthritis     lower back  . Pneumonia   . PAF (paroxysmal atrial fibrillation)     noted on event monitor. No correlation with episodes and symptoms  . Chronic anticoagulation   . RBBB   . 1St degree AV block   . Diabetes mellitus type II   . DM neuropathy, type II diabetes mellitus   . Hyperlipidemia   . HTN  (hypertension)    Past Surgical History  Procedure Laterality Date  . Cholecystectomy    . Abdominal hysterectomy    . Nasal sinus surgery      x2  . Bilateral arthroscopic knee surgery    . Stent surgery    . Coronary angioplasty      stent  . Carpel tunnel wrist rt     Family History  Problem Relation Age of Onset  . Breast cancer Sister   . Heart disease Brother   . Colon cancer Neg Hx    History  Substance Use Topics  . Smoking status: Never Smoker   . Smokeless tobacco: Never Used  . Alcohol Use: No   OB History   Grav Para Term Preterm Abortions TAB SAB Ect Mult Living                 Review of Systems  All other systems reviewed and are negative.     Allergies  Amoxicillin  Home Medications   Prior to Admission medications   Medication Sig Start Date End Date Taking? Authorizing Provider  alendronate (FOSAMAX) 70 MG tablet Take 1 tablet (70 mg total)  by mouth every 7 (seven) days. Take with a full glass of water on an empty stomach. 12/08/12  Yes Mary B Dixon, PA-C  amLODipine (NORVASC) 2.5 MG tablet Take 2.5 mg by mouth at bedtime.    Yes Historical Provider, MD  apixaban (ELIQUIS) 5 MG TABS tablet Take 1 tablet (5 mg total) by mouth 2 (two) times daily. 04/16/13  Yes Fay Records, MD  aspirin 81 MG EC tablet Take 81 mg by mouth daily.     Yes Historical Provider, MD  Calcium Carbonate-Vit D-Min (CALCIUM 1200 PO) Take 1 tablet by mouth 2 (two) times daily.    Yes Historical Provider, MD  gabapentin (NEURONTIN) 600 MG tablet Take 600 mg by mouth 3 (three) times daily.  10/25/13  Yes Historical Provider, MD  glipiZIDE (GLUCOTROL XL) 5 MG 24 hr tablet Take 5 mg by mouth daily with breakfast.   Yes Historical Provider, MD  Liniments (SALONPAS) PADS Apply 1 each topically daily.   Yes Historical Provider, MD  Multiple Vitamins-Minerals (CENTRUM SILVER PO) Take 1 tablet by mouth daily.    Yes Historical Provider, MD  nitroGLYCERIN (NITROSTAT) 0.4 MG SL tablet Place  1 tablet (0.4 mg total) under the tongue every 5 (five) minutes as needed for chest pain (up to 3 doses). 07/18/12  Yes Dayna N Dunn, PA-C  polyvinyl alcohol (LIQUIFILM TEARS) 1.4 % ophthalmic solution Place 2 drops into both eyes as needed (dry eyes.).   Yes Historical Provider, MD  PRESCRIPTION MEDICATION Take 1 tablet by mouth daily.   Yes Historical Provider, MD  rosuvastatin (CRESTOR) 10 MG tablet Take 10 mg by mouth daily.   Yes Historical Provider, MD  traMADol (ULTRAM) 50 MG tablet Take 1 tablet (50 mg total) by mouth every 6 (six) hours as needed for pain. 09/21/12  Yes Shari A Upstill, PA-C  TRUETEST TEST test strip 2 (two) times daily. 12/01/12   Historical Provider, MD   BP 106/75  Pulse 74  Temp(Src) 97.6 F (36.4 C) (Oral)  Resp 18  Ht 5\' 5"  (1.651 m)  Wt 180 lb (81.647 kg)  BMI 29.95 kg/m2  SpO2 95% Physical Exam  Nursing note and vitals reviewed. Constitutional: No distress.  HENT:  Head: Normocephalic and atraumatic.  Right Ear: External ear normal.  Left Ear: External ear normal.  Eyes: Conjunctivae are normal. Right eye exhibits no discharge. Left eye exhibits no discharge. No scleral icterus.  Neck: Neck supple. No tracheal deviation present.  Cardiovascular: Normal rate, regular rhythm and intact distal pulses.   Pulmonary/Chest: Effort normal and breath sounds normal. No stridor. No respiratory distress. She has no wheezes. She has no rales.  Abdominal: Soft. Bowel sounds are normal. She exhibits no distension. There is no tenderness. There is no rebound and no guarding.  Musculoskeletal: She exhibits no edema.       Right knee: She exhibits bony tenderness. She exhibits normal range of motion, no swelling, no deformity and normal alignment. Tenderness found. Medial joint line tenderness noted.  Neurological: She is alert. She has normal strength. No cranial nerve deficit (no facial droop, extraocular movements intact, no slurred speech) or sensory deficit. She  exhibits normal muscle tone. She displays no seizure activity. Coordination normal.  Skin: Skin is warm and dry. No rash noted.  Psychiatric: She has a normal mood and affect.    ED Course  Procedures (including critical care time) Labs Review Labs Reviewed  COMPREHENSIVE METABOLIC PANEL - Abnormal; Notable for the following:  Glucose, Bld 104 (*)    GFR calc non Af Amer 48 (*)    GFR calc Af Amer 55 (*)    All other components within normal limits  PROTIME-INR  APTT  CBC  DIFFERENTIAL  URINALYSIS, ROUTINE W REFLEX MICROSCOPIC  I-STAT TROPOININ, ED    Imaging Review Dg Chest 2 View  05/05/2014   CLINICAL DATA:  Diabetes and hypertension.  EXAM: CHEST  2 VIEW  COMPARISON:  12/07/2013.  FINDINGS: Mediastinum and hilar structures normal. Subsegmental atelectasis noted in the lung bases. No pleural effusion or pneumothorax. Cardiomegaly with normal pulmonary vascularity. No acute bony abnormality. Old left posterior rib fractures.  IMPRESSION: 1.  Mild bibasilar subsegmental atelectasis and/or scarring.  2.  Mild cardiomegaly.   Electronically Signed   By: Marcello Moores  Register   On: 05/05/2014 21:44   Dg Knee Complete 4 Views Right  05/05/2014   CLINICAL DATA:  Post fall in kitchen, now with right knee pain.  EXAM: RIGHT KNEE - COMPLETE 4+ VIEW  COMPARISON:  None.  FINDINGS: No acute fracture or dislocation. Severe tricompartmental degenerative change of the knee, worse within the medial compartment and patellofemoral joint with near complete joint space loss, articular surface irregularity, subchondral sclerosis and osteophytosis. Note is made of a small joint effusion. There are displaced ossicles about the superior pole of the patella as well as the medial femoral condyle which are favored to be the sequela of remote avulsive injury and are without discrete donor sites. Adjacent vascular calcifications. No radiopaque foreign body.  IMPRESSION: 1. No acute findings. 2. Severe  tricompartmental degenerative change of the knee, worse within the medial compartments and patellofemoral joints.   Electronically Signed   By: Sandi Mariscal M.D.   On: 05/05/2014 21:46     EKG Interpretation   Date/Time:  Wednesday May 05 2014 20:45:03 EDT Ventricular Rate:  73 PR Interval:    QRS Duration: 164 QT Interval:  432 QTC Calculation: 476 R Axis:   56 Text Interpretation:  Sinus rhythm with first degree AV block and sinus  arrhythmia Right bundle branch block similar appearance to prior ECG  Reconfirmed by Raheem Kolbe  MD-J, Kaseem Vastine ([id number]) on 05/05/2014 10:50:37 PM      MDM   Final diagnoses:  Weakness  Osteoarthritis of right knee, unspecified osteoarthritis type    Pt was able to walk around the ED without dizziness or other complaints.  She has history of arthritis.  She is supposed to use a walker but was not doing so tonight.  Suspect her knee arthritis may have caused her some difficulties.  She has not weakness now.  Doubt tia or stroke with her generalized symptoms earlier.  Pt feels well enough to go home.  Follow up with her PCP   Dorie Rank, MD 05/05/14 [phone]

## 2014-05-05 NOTE — Discharge Instructions (Signed)
Arthritis, Nonspecific °Arthritis is inflammation of a joint. This usually means pain, redness, warmth or swelling are present. One or more joints may be involved. There are a number of types of arthritis. Your caregiver may not be able to tell what type of arthritis you have right away. °CAUSES  °The most common cause of arthritis is the wear and tear on the joint (osteoarthritis). This causes damage to the cartilage, which can break down over time. The knees, hips, back and neck are most often affected by this type of arthritis. °Other types of arthritis and common causes of joint pain include: °· Sprains and other injuries near the joint. Sometimes minor sprains and injuries cause pain and swelling that develop hours later. °· Rheumatoid arthritis. This affects hands, feet and knees. It usually affects both sides of your body at the same time. It is often associated with chronic ailments, fever, weight loss and general weakness. °· Crystal arthritis. Gout and pseudo gout can cause occasional acute severe pain, redness and swelling in the foot, ankle, or knee. °· Infectious arthritis. Bacteria can get into a joint through a break in overlying skin. This can cause infection of the joint. Bacteria and viruses can also spread through the blood and affect your joints. °· Drug, infectious and allergy reactions. Sometimes joints can become mildly painful and slightly swollen with these types of illnesses. °SYMPTOMS  °· Pain is the main symptom. °· Your joint or joints can also be red, swollen and warm or hot to the touch. °· You may have a fever with certain types of arthritis, or even feel overall ill. °· The joint with arthritis will hurt with movement. Stiffness is present with some types of arthritis. °DIAGNOSIS  °Your caregiver will suspect arthritis based on your description of your symptoms and on your exam. Testing may be needed to find the type of arthritis: °· Blood and sometimes urine tests. °· X-ray tests  and sometimes CT or MRI scans. °· Removal of fluid from the joint (arthrocentesis) is done to check for bacteria, crystals or other causes. Your caregiver (or a specialist) will numb the area over the joint with a local anesthetic, and use a needle to remove joint fluid for examination. This procedure is only minimally uncomfortable. °· Even with these tests, your caregiver may not be able to tell what kind of arthritis you have. Consultation with a specialist (rheumatologist) may be helpful. °TREATMENT  °Your caregiver will discuss with you treatment specific to your type of arthritis. If the specific type cannot be determined, then the following general recommendations may apply. °Treatment of severe joint pain includes: °· Rest. °· Elevation. °· Anti-inflammatory medication (for example, ibuprofen) may be prescribed. Avoiding activities that cause increased pain. °· Only take over-the-counter or prescription medicines for pain and discomfort as recommended by your caregiver. °· Cold packs over an inflamed joint may be used for 10 to 15 minutes every hour. Hot packs sometimes feel better, but do not use overnight. Do not use hot packs if you are diabetic without your caregiver's permission. °· A cortisone shot into arthritic joints may help reduce pain and swelling. °· Any acute arthritis that gets worse over the next 1 to 2 days needs to be looked at to be sure there is no joint infection. °Long-term arthritis treatment involves modifying activities and lifestyle to reduce joint stress jarring. This can include weight loss. Also, exercise is needed to nourish the joint cartilage and remove waste. This helps keep the muscles   around the joint strong. °HOME CARE INSTRUCTIONS  °· Do not take aspirin to relieve pain if gout is suspected. This elevates uric acid levels. °· Only take over-the-counter or prescription medicines for pain, discomfort or fever as directed by your caregiver. °· Rest the joint as much as  possible. °· If your joint is swollen, keep it elevated. °· Use crutches if the painful joint is in your leg. °· Drinking plenty of fluids may help for certain types of arthritis. °· Follow your caregiver's dietary instructions. °· Try low-impact exercise such as: °¨ Swimming. °¨ Water aerobics. °¨ Biking. °¨ Walking. °· Morning stiffness is often relieved by a warm shower. °· Put your joints through regular range-of-motion. °SEEK MEDICAL CARE IF:  °· You do not feel better in 24 hours or are getting worse. °· You have side effects to medications, or are not getting better with treatment. °SEEK IMMEDIATE MEDICAL CARE IF:  °· You have a fever. °· You develop severe joint pain, swelling or redness. °· Many joints are involved and become painful and swollen. °· There is severe back pain and/or leg weakness. °· You have loss of bowel or bladder control. °Document Released: 08/16/2004 Document Revised: 10/01/2011 Document Reviewed: 09/01/2008 °Exit[employer]® Patient Information ©2015 [employer]. This information is not intended to replace advice given to you by your health care provider. Make sure you discuss any questions you have with your health care provider. ° °

## 2014-05-05 NOTE — ED Notes (Signed)
CBG 120 

## 2014-05-18 ENCOUNTER — Ambulatory Visit (INDEPENDENT_AMBULATORY_CARE_PROVIDER_SITE_OTHER): Payer: Medicare HMO | Admitting: *Deleted

## 2014-05-18 DIAGNOSIS — I4891 Unspecified atrial fibrillation: Secondary | ICD-10-CM

## 2014-05-18 NOTE — Progress Notes (Signed)
Pt was started on Eliquis for Afib on 04/20/2013.    Reviewed patients medication list.  Pt is not  currently on any combined P-gp and strong CYP3A4 inhibitors/inducers (ketoconazole, traconazole, ritonavir, carbamazepine, phenytoin, rifampin, St. John's wort).  Reviewed labs from 05/05/14 hospital admission.  SCr 1.02, Weight 81Kg,  Age 42Yrs. Dose appropriate based on specified criteria.  Pt take Eliquis 5mg s BID. Hgb and HCT WNLs.  A full discussion of the nature of anticoagulants has been carried out.  A benefit/risk analysis has been presented to the patient, so that they understand the justification for choosing anticoagulation with Eliquis at this time.  The need for compliance is stressed.  Pt is aware to take the medication twice daily.  Side effects of potential bleeding are discussed, including unusual colored urine or stools, coughing up blood or coffee ground emesis, nose bleeds or serious fall or head trauma.  Discussed signs and symptoms of stroke. The patient should avoid any OTC items containing aspirin or ibuprofen.  Avoid alcohol consumption.   Call if any signs of abnormal bleeding.  Discussed financial obligations and resolved any difficulty in obtaining medication.  Next lab test test in 6 months.

## 2014-05-21 ENCOUNTER — Other Ambulatory Visit (HOSPITAL_COMMUNITY): Payer: Self-pay | Admitting: *Deleted

## 2014-05-21 DIAGNOSIS — I6523 Occlusion and stenosis of bilateral carotid arteries: Secondary | ICD-10-CM

## 2014-05-25 ENCOUNTER — Ambulatory Visit (HOSPITAL_COMMUNITY): Payer: Medicare HMO | Attending: Internal Medicine | Admitting: *Deleted

## 2014-05-25 DIAGNOSIS — E119 Type 2 diabetes mellitus without complications: Secondary | ICD-10-CM | POA: Insufficient documentation

## 2014-05-25 DIAGNOSIS — I2510 Atherosclerotic heart disease of native coronary artery without angina pectoris: Secondary | ICD-10-CM | POA: Diagnosis present

## 2014-05-25 DIAGNOSIS — I10 Essential (primary) hypertension: Secondary | ICD-10-CM | POA: Diagnosis not present

## 2014-05-25 DIAGNOSIS — I6523 Occlusion and stenosis of bilateral carotid arteries: Secondary | ICD-10-CM

## 2014-05-25 DIAGNOSIS — E785 Hyperlipidemia, unspecified: Secondary | ICD-10-CM | POA: Diagnosis not present

## 2014-05-25 NOTE — Progress Notes (Signed)
Carotid Duplex Performed 

## 2014-06-07 ENCOUNTER — Encounter: Payer: Self-pay | Admitting: Internal Medicine

## 2014-06-07 ENCOUNTER — Ambulatory Visit (INDEPENDENT_AMBULATORY_CARE_PROVIDER_SITE_OTHER): Payer: Medicare HMO | Admitting: Internal Medicine

## 2014-06-07 VITALS — BP 135/60 | HR 91 | Ht 65.0 in | Wt 177.0 lb

## 2014-06-07 DIAGNOSIS — I10 Essential (primary) hypertension: Secondary | ICD-10-CM

## 2014-06-07 DIAGNOSIS — E119 Type 2 diabetes mellitus without complications: Secondary | ICD-10-CM

## 2014-06-07 LAB — TSH: TSH: 0.6 u[IU]/mL (ref 0.35–4.50)

## 2014-06-07 LAB — LIPID PANEL
Cholesterol: 148 mg/dL (ref 0–200)
HDL: 61.9 mg/dL (ref >39.00)
LDL Cholesterol: 69 mg/dL (ref 0–99)
NonHDL: 86.1
TRIGLYCERIDES: 84 mg/dL (ref 0.0–149.0)
Total CHOL/HDL Ratio: 2
VLDL: 16.8 mg/dL (ref 0.0–40.0)

## 2014-06-07 LAB — HEMOGLOBIN A1C: Hgb A1c MFr Bld: 6 % (ref 4.6–6.5)

## 2014-06-07 LAB — T4, FREE: FREE T4: 1.07 ng/dL (ref 0.60–1.60)

## 2014-06-07 LAB — T3, FREE: T3 FREE: 2.1 pg/mL — AB (ref 2.3–4.2)

## 2014-06-07 LAB — AST: AST: 23 U/L (ref 0–37)

## 2014-06-07 MED ORDER — METOPROLOL TARTRATE 25 MG PO TABS: 25.0000 mg | ORAL_TABLET | Freq: Two times a day (BID) | ORAL | Status: DC

## 2014-06-07 MED ORDER — METOPROLOL TARTRATE 25 MG PO TABS: 25.0000 mg | ORAL_TABLET | Freq: Every day | ORAL | Status: DC

## 2014-06-07 NOTE — Patient Instructions (Signed)
Your physician recommends that you return for lab work in: today (AST, LIPIDS, TSH, FREE T4, FREE T3 HGA1C Your physician has recommended you make the following change in your medication:  1.) START METOPROLOL 25 MG DAILY AT [place] wants you to follow-up in: April 2016 [place] will receive a reminder letter in the mail two months in advance. If you don't receive a letter, please call our office to schedule the follow-up appointment.

## 2014-06-07 NOTE — Progress Notes (Signed)
HPI Patient is an 80 yo with a history of CAD and PAF She is s/p DES to mid LAD in 2005. PTCA 2006.  Patient was last in clinic in the fall  .  Patient notes occasional chest pains  More in evening  N0t assocated with acitivty Breating is OK   Myoview in may 2014 was normal  Patient was last in clinic in May  She is on Eliquis for anticoag for afib / flutter     She denies CP  Breahting is stable  She does note some palpitations, mainly in afternoon  No dizziness      Allergies     Allergies  Allergen Reactions  . Amoxicillin Other (See Comments)    UNKNOWN    Current Outpatient Prescriptions  Medication Sig Dispense Refill  . alendronate (FOSAMAX) 70 MG tablet Take 1 tablet (70 mg total) by mouth every 7 (seven) days. Take with a full glass of water on an empty stomach. 4 tablet 11  . amLODipine (NORVASC) 2.5 MG tablet Take 2.5 mg by mouth at bedtime.     Marland Kitchen apixaban (ELIQUIS) 5 MG TABS tablet Take 1 tablet (5 mg total) by mouth 2 (two) times daily. 60 tablet 6  . aspirin 81 MG EC tablet Take 81 mg by mouth daily.      . Calcium Carbonate-Vit D-Min (CALCIUM 1200 PO) Take 1 tablet by mouth 2 (two) times daily.     Marland Kitchen gabapentin (NEURONTIN) 600 MG tablet Take 600 mg by mouth 3 (three) times daily.     Marland Kitchen glipiZIDE (GLUCOTROL XL) 5 MG 24 hr tablet Take 5 mg by mouth daily with breakfast.    . Liniments (SALONPAS) PADS Apply 1 each topically daily.    . Multiple Vitamins-Minerals (CENTRUM SILVER PO) Take 1 tablet by mouth daily.     . nitroGLYCERIN (NITROSTAT) 0.4 MG SL tablet Place 1 tablet (0.4 mg total) under the tongue every 5 (five) minutes as needed for chest pain (up to 3 doses). 25 tablet 4  . polyvinyl alcohol (LIQUIFILM TEARS) 1.4 % ophthalmic solution Place 2 drops into both eyes as needed (dry eyes.).    Marland Kitchen PRESCRIPTION MEDICATION Take 1 tablet by mouth daily.    . rosuvastatin (CRESTOR) 10 MG tablet Take 10 mg by mouth daily.    . traMADol (ULTRAM) 50 MG tablet Take 1  tablet (50 mg total) by mouth every 6 (six) hours as needed for pain. 20 tablet 0  . TRUETEST TEST test strip 2 (two) times daily.     No current facility-administered medications for this visit.    Past Medical History  Diagnosis Date  . Allergic rhinitis   . Anemia     NOS chronic disease. Hgb 11.6gm% 04/14/2009  . CAD (coronary artery disease)     a. s/p DES mLAD 2005. b. Cutting balloon angioplasty for ISR 2006. c. LHC 08/2010 nonobstructive. d. Myoview 06/2012: small anteroapical infarct/mod inferolat wall infarct but no ischemia, EF 75%.  . Diverticulitis of colon   . GERD (gastroesophageal reflux disease)   . IBS (irritable bowel syndrome)   . Spinal stenosis     djd lumbar  . Multinodular goiter   . Orthostatic hypotension   . Obesity     BMI-37  . Acute renal failure     due to dehydration-Sept 2009; Normal creat 1.2 on fu 05/13/2009  . Hx of colonoscopy   . Cancer     on nose and had it removed  .  DEMENTIA     slight  . Osteoarthritis     lower back  . Pneumonia   . PAF (paroxysmal atrial fibrillation)     noted on event monitor. No correlation with episodes and symptoms  . Chronic anticoagulation   . RBBB   . 1St degree AV block   . Diabetes mellitus type II   . DM neuropathy, type II diabetes mellitus   . Hyperlipidemia   . HTN (hypertension)     Past Surgical History  Procedure Laterality Date  . Cholecystectomy    . Abdominal hysterectomy    . Nasal sinus surgery      x2  . Bilateral arthroscopic knee surgery    . Stent surgery    . Coronary angioplasty      stent  . Carpel tunnel wrist rt      Family History  Problem Relation Age of Onset  . Breast cancer Sister   . Heart disease Brother   . Colon cancer Neg Hx   . Heart attack Son   . Heart attack Daughter   . Heart attack Father   . Stroke Neg Hx     History   Social History  . Marital Status: Married    Spouse Name: N/A    Number of Children: N/A  . Years of Education: N/A    Occupational History  . retired    Social History Main Topics  . Smoking status: Never Smoker   . Smokeless tobacco: Never Used  . Alcohol Use: No  . Drug Use: No  . Sexual Activity: No   Other Topics Concern  . Not on file   Social History Narrative    Review of Systems:  All systems reviewed.  They are negative to the above problem except as previously stated.  Vital Signs: BP 135/60 mmHg  Pulse 91  Ht 5\' 5"  (1.651 m)  Wt 177 lb (80.287 kg)  BMI 29.45 kg/m2  SpO2 98%  Physical Exam Patient is in NAD HEENT:  Normocephalic, atraumatic. EOMI, PERRLA.  Neck: JVP is normal.  No bruits.  Lungs: clear to auscultation. No rales no wheezes.  Heart: Irregular rate and rhythm. Normal S1, S2. No S3.   No significant murmurs. PMI not displaced.  Abdomen:  Supple, nontender. Normal bowel sounds. No masses. No hepatomegaly.  Extremities:   Good distal pulses throughout. No lower extremity edema.  Musculoskeletal :moving all extremities. Back  nonntender Neuro:   alert and oriented x3.  CN II-XII grossly intact.  Assessment and Plan:  1.  CAD  No symptoms to sugg active ischemia  2.  Atrial fib/fultter.  Keep on eliquis  Does notice palpitations in afternoon  Will add metoprolol 25 1x epr day and follow  Take at noon   Check labs   3.  HL  Keep on crestor. Check lipids      .

## 2014-06-29 ENCOUNTER — Telehealth: Payer: Self-pay | Admitting: Internal Medicine

## 2014-06-29 NOTE — Telephone Encounter (Signed)
New Msg  Pt states she was called today and is returning the call. Please contact at [phone].

## 2014-06-29 NOTE — Telephone Encounter (Signed)
Pt given results of lab done Nov 2015.

## 2014-07-29 ENCOUNTER — Other Ambulatory Visit: Payer: Self-pay

## 2014-07-29 DIAGNOSIS — Z1231 Encounter for screening mammogram for malignant neoplasm of breast: Secondary | ICD-10-CM

## 2014-08-09 ENCOUNTER — Telehealth: Payer: Self-pay | Admitting: Internal Medicine

## 2014-08-09 NOTE — Telephone Encounter (Signed)
PT REQUESTING SAMPLES OF ELIQUIS, PLS CALL

## 2014-08-10 NOTE — Telephone Encounter (Signed)
No eliquis in sample closet. Patient has enough to get through the week.  She is aware I will discuss with Elberta Leatherwood, PharmD to see if we can assist.

## 2014-08-11 MED ORDER — APIXABAN 5 MG PO TABS: 5.0000 mg | ORAL_TABLET | Freq: Two times a day (BID) | ORAL | Status: DC

## 2014-08-11 NOTE — Telephone Encounter (Signed)
Informed patient. She said she got new insurance recently. She asked for a prescription and will go to [facility] and see how much it will be. Reordered medication. She is appreciative for the call back.

## 2014-08-11 NOTE — Telephone Encounter (Signed)
Unfortunately we have no control over when we get samples so would not suggest that patient rely only on samples for her to stay on the medication.  If she has not used the 30-day free card, she can try that.  Otherwise we should send in a prescription for her insurance to cover the medication.  If cost and/or donut hole are an issue, she can see if she qualifies for patient assistance.  I believe for Medicare patients, the coverage depends on how much they spend on their medications each year so she would have to get that information from her pharmacy.  Only other option would be changing to another agent (i.e. Warfarin).

## 2014-08-12 ENCOUNTER — Ambulatory Visit: Payer: Medicare HMO

## 2014-08-19 ENCOUNTER — Telehealth: Payer: Self-pay | Admitting: *Deleted

## 2014-08-19 NOTE — Telephone Encounter (Signed)
Eliquis samples placed at the front desk for patient. 

## 2014-09-03 ENCOUNTER — Telehealth: Payer: Self-pay

## 2014-09-03 NOTE — Telephone Encounter (Signed)
Called [employer] for prior authorization of Eliquis 5 mg twice daily by mouth. Patient was approved with Medicare part D for Eliquis 5 mg twice daily by mouth until 09/04/2015. Confirmation number of approval is [id number].

## 2014-09-07 ENCOUNTER — Ambulatory Visit: Payer: Medicare HMO

## 2014-09-20 ENCOUNTER — Encounter: Payer: Self-pay | Admitting: Internal Medicine

## 2014-11-05 ENCOUNTER — Ambulatory Visit (INDEPENDENT_AMBULATORY_CARE_PROVIDER_SITE_OTHER): Payer: Medicare Other | Admitting: Internal Medicine

## 2014-11-05 ENCOUNTER — Encounter: Payer: Self-pay | Admitting: Internal Medicine

## 2014-11-05 VITALS — BP 110/56 | HR 81 | Ht 65.0 in | Wt 183.0 lb

## 2014-11-05 DIAGNOSIS — E785 Hyperlipidemia, unspecified: Secondary | ICD-10-CM | POA: Diagnosis not present

## 2014-11-05 NOTE — Patient Instructions (Signed)
Your physician recommends that you continue on your current medications as directed. Please refer to the Current Medication list given to you today. Your physician wants you to follow-up in: December 2016 WITH DR. Harrington Challenger.   You will receive a reminder letter in the mail two months in advance. If you don't receive a letter, please call our office to schedule the follow-up appointment.

## 2014-11-05 NOTE — Progress Notes (Signed)
Cardiology Office Note   Date:  11/05/2014   ID:  [name], DOB October 03, 1923, MRN [id number]  PCP:  Chesley Noon, MD  Cardiologist:   Dorris Carnes, MD   No chief complaint on file.     History of Present Illness: [name] is a 81 y.o. female with a history of  CAD and PAF  S/p DES to mid LAD in 2005  PTCA 2006.  I saw hier in clinic in 2015.    Labs November LDL was 69  HDL 62.    SInce seen she has been doing OK from cardiac standpoint.  Denies CP  Breathing is stable  No palpitations.  Had lifescreen whech showed mild plaquing of carotids      Current Outpatient Prescriptions  Medication Sig Dispense Refill  . acetaminophen (TYLENOL) 650 MG CR tablet Take 650 mg by mouth as directed.    Marland Kitchen alendronate (FOSAMAX) 70 MG tablet Take 1 tablet (70 mg total) by mouth every 7 (seven) days. Take with a full glass of water on an empty stomach. 4 tablet 11  . amLODipine (NORVASC) 2.5 MG tablet Take 2.5 mg by mouth at bedtime.     Marland Kitchen apixaban (ELIQUIS) 5 MG TABS tablet Take 1 tablet (5 mg total) by mouth 2 (two) times daily. 60 tablet 11  . aspirin 81 MG EC tablet Take 81 mg by mouth daily.      . Calcium Carbonate-Vit D-Min (CALCIUM 1200 PO) Take 1 tablet by mouth 2 (two) times daily.     Marland Kitchen gabapentin (NEURONTIN) 600 MG tablet Take 600 mg by mouth 3 (three) times daily.     Marland Kitchen glipiZIDE (GLUCOTROL XL) 5 MG 24 hr tablet Take 5 mg by mouth daily with breakfast.    . Multiple Vitamins-Minerals (CENTRUM SILVER PO) Take 1 tablet by mouth daily.     . Multiple Vitamins-Minerals (PRESERVISION AREDS 2 PO) Take 2 tablets by mouth daily.    . nitroGLYCERIN (NITROSTAT) 0.4 MG SL tablet Place 1 tablet (0.4 mg total) under the tongue every 5 (five) minutes as needed for chest pain (up to 3 doses). 25 tablet 4  . polyvinyl alcohol (LIQUIFILM TEARS) 1.4 % ophthalmic solution Place 2 drops into both eyes as needed (dry eyes.).    Marland Kitchen rosuvastatin (CRESTOR) 10 MG tablet Take 10 mg by mouth  daily.    . traMADol (ULTRAM) 50 MG tablet Take 1 tablet (50 mg total) by mouth every 6 (six) hours as needed for pain. 20 tablet 0  . TRUETEST TEST test strip 2 (two) times daily.     No current facility-administered medications for this visit.    Allergies:   Amoxicillin   Past Medical History  Diagnosis Date  . Allergic rhinitis   . Anemia     NOS chronic disease. Hgb 11.6gm% 04/14/2009  . CAD (coronary artery disease)     a. s/p DES mLAD 2005. b. Cutting balloon angioplasty for ISR 2006. c. LHC 08/2010 nonobstructive. d. Myoview 06/2012: small anteroapical infarct/mod inferolat wall infarct but no ischemia, EF 75%.  . Diverticulitis of colon   . GERD (gastroesophageal reflux disease)   . IBS (irritable bowel syndrome)   . Spinal stenosis     djd lumbar  . Multinodular goiter   . Orthostatic hypotension   . Obesity     BMI-37  . Acute renal failure     due to dehydration-Sept 2009; Normal creat 1.2 on fu 05/13/2009  . Hx of colonoscopy   .  Cancer     on nose and had it removed  . DEMENTIA     slight  . Osteoarthritis     lower back  . Pneumonia   . PAF (paroxysmal atrial fibrillation)     noted on event monitor. No correlation with episodes and symptoms  . Chronic anticoagulation   . RBBB   . 1St degree AV block   . Diabetes mellitus type II   . DM neuropathy, type II diabetes mellitus   . Hyperlipidemia   . HTN (hypertension)     Past Surgical History  Procedure Laterality Date  . Cholecystectomy    . Abdominal hysterectomy    . Nasal sinus surgery      x2  . Bilateral arthroscopic knee surgery    . Stent surgery    . Coronary angioplasty      stent  . Carpel tunnel wrist rt       Social History:  The patient  reports that she has never smoked. She has never used smokeless tobacco. She reports that she does not drink alcohol or use illicit drugs.   Family History:  The patient's family history includes Breast cancer in her sister; Heart attack in her  daughter, father, and son; Heart disease in her brother. There is no history of Colon cancer or Stroke.    ROS:  Please see the history of present illness. All other systems are reviewed and  Negative to the above problem except as noted.    PHYSICAL EXAM: VS:  BP 110/56 mmHg  Pulse 81  Ht 5\' 5"  (1.651 m)  Wt 183 lb (83.008 kg)  BMI 30.45 kg/m2  SpO2 95%  GEN: Well nourished, well developed, in no acute distress HEENT: normal Neck: no JVD, carotid bruits, or masses Cardiac: RRR; no murmurs, rubs, or gallops,no edema  Respiratory:  clear to auscultation bilaterally, normal work of breathing GI: soft, nontender, nondistended, + BS  No hepatomegaly  MS: no deformity Moving all extremities   Skin: warm and dry, no rash Neuro:  Strength and sensation are intact Psych: euthymic mood, full affect   EKG:  EKG is ordered today.   Lipid Panel    Component Value Date/Time   CHOL 148 06/07/2014 1143   TRIG 84.0 06/07/2014 1143   HDL 61.90 06/07/2014 1143   CHOLHDL 2 06/07/2014 1143   VLDL 16.8 06/07/2014 1143   LDLCALC 69 06/07/2014 1143      Wt Readings from Last 3 Encounters:  11/05/14 183 lb (83.008 kg)  06/07/14 177 lb (80.287 kg)  05/05/14 180 lb (81.647 kg)      ASSESSMENT AND PLAN:  1.  CAD  Doing well  Keep on same regimen    2  HL  Will get labs from Dr Vanetta Mulders office     Current medicines are reviewed at length with the patient today.  The patient does not have concerns regarding medicines.  The following changes have been made:   Labs/ tests ordered today include: No orders of the defined types were placed in this encounter.     Disposition:   FU with  in   Signed, Dorris Carnes, MD  11/05/2014 2:17 PM    [facility] [address], [place], [place]  [address] Phone: [phone]; Fax: [phone]

## 2014-11-17 ENCOUNTER — Ambulatory Visit (INDEPENDENT_AMBULATORY_CARE_PROVIDER_SITE_OTHER): Payer: Medicare Other | Admitting: *Deleted

## 2014-11-17 DIAGNOSIS — Z5181 Encounter for therapeutic drug level monitoring: Secondary | ICD-10-CM | POA: Diagnosis not present

## 2014-11-17 DIAGNOSIS — I4891 Unspecified atrial fibrillation: Secondary | ICD-10-CM | POA: Diagnosis not present

## 2014-11-17 LAB — BASIC METABOLIC PANEL
BUN: 22 mg/dL (ref 6–23)
CO2: 32 meq/L (ref 19–32)
CREATININE: 1.51 mg/dL — AB (ref 0.40–1.20)
Calcium: 10 mg/dL (ref 8.4–10.5)
Chloride: 101 mEq/L (ref 96–112)
GFR: 34.51 mL/min — ABNORMAL LOW (ref >60.00)
Glucose, Bld: 98 mg/dL (ref 70–99)
Potassium: 4.2 mEq/L (ref 3.5–5.1)
Sodium: 137 mEq/L (ref 135–145)

## 2014-11-17 LAB — CBC
HCT: 40.7 % (ref 36.0–46.0)
Hemoglobin: 13.8 g/dL (ref 12.0–15.0)
MCHC: 33.8 g/dL (ref 30.0–36.0)
MCV: 92.3 fl (ref 78.0–100.0)
Platelets: 207 10*3/uL (ref 150.0–400.0)
RBC: 4.41 Mil/uL (ref 3.87–5.11)
RDW: 13.9 % (ref 11.5–15.5)
WBC: 6.3 10*3/uL (ref 4.0–10.5)

## 2014-11-17 NOTE — Progress Notes (Signed)
Pt was started on Eliquis 5 mg twice a day for AFIB on 04/20/2013 by Dr. Harrington Challenger.    Reviewed patients medication list.  Pt is not currently on any combined P-gp and strong CYP3A4 inhibitors/inducers (ketoconazole, traconazole, ritonavir, carbamazepine, phenytoin, rifampin, St. John's wort).  Reviewed labs-SCr 1.51, Hgb 13.8,  Hct 40.7, Weight 82.3 kg.  Dose needs to be adjusted based on dosing criteria.  Spoke with patient and advised her to start taking Eliquis 2.5 mg twice a day.   A full discussion of the nature of anticoagulants has been carried out.  A benefit/risk analysis has been presented to the patient, so that they understand the justification for choosing anticoagulation with Eliquis at this time.  The need for compliance is stressed.  Pt is aware to take the medication twice daily.  Side effects of potential bleeding are discussed, including unusual colored urine or stools, coughing up blood or coffee ground emesis, nose bleeds or serious fall or head trauma.  Discussed signs and symptoms of stroke. The patient should avoid any OTC items containing aspirin or ibuprofen.  Avoid alcohol consumption.   Call if any signs of abnormal bleeding.  Discussed financial obligations and resolved any difficulty in obtaining medication.    Sent to lab for BMET and CBC today.  Spoke with patient and advised her to start taking Eliquis 2.5 mg twice a day.  Advised that we would notify Dr. Harrington Challenger and call her back with any additional information.  Patient verbalized understanding over the phone.

## 2014-11-19 ENCOUNTER — Other Ambulatory Visit: Payer: Self-pay | Admitting: *Deleted

## 2014-11-19 ENCOUNTER — Telehealth: Payer: Self-pay | Admitting: *Deleted

## 2014-11-19 DIAGNOSIS — E785 Hyperlipidemia, unspecified: Secondary | ICD-10-CM

## 2014-11-19 DIAGNOSIS — I10 Essential (primary) hypertension: Secondary | ICD-10-CM

## 2014-11-19 MED ORDER — APIXABAN 5 MG PO TABS: 2.5000 mg | ORAL_TABLET | Freq: Two times a day (BID) | ORAL | Status: DC

## 2014-11-19 NOTE — Telephone Encounter (Signed)
Informed patient. Orders placed. She will come in on Tuesday. Appointment made.

## 2014-11-19 NOTE — Telephone Encounter (Signed)
-----   Message from Fay Records, MD sent at 11/18/2014  9:19 AM EDT ----- CBC is normla.  Kidney function is a little different   Would set up for repeat BMET Also needs fasting lipids and AST to be done.

## 2014-11-23 ENCOUNTER — Other Ambulatory Visit: Payer: Medicare Other

## 2014-11-30 ENCOUNTER — Other Ambulatory Visit (INDEPENDENT_AMBULATORY_CARE_PROVIDER_SITE_OTHER): Payer: Medicare Other | Admitting: *Deleted

## 2014-11-30 DIAGNOSIS — I10 Essential (primary) hypertension: Secondary | ICD-10-CM

## 2014-11-30 DIAGNOSIS — E785 Hyperlipidemia, unspecified: Secondary | ICD-10-CM | POA: Diagnosis not present

## 2014-11-30 LAB — LIPID PANEL
CHOL/HDL RATIO: 2
Cholesterol: 117 mg/dL (ref 0–200)
HDL: 56.1 mg/dL (ref >39.00)
LDL Cholesterol: 45 mg/dL (ref 0–99)
NonHDL: 60.9
Triglycerides: 80 mg/dL (ref 0.0–149.0)
VLDL: 16 mg/dL (ref 0.0–40.0)

## 2014-11-30 LAB — BASIC METABOLIC PANEL
BUN: 18 mg/dL (ref 6–23)
CALCIUM: 9.3 mg/dL (ref 8.4–10.5)
CO2: 32 mEq/L (ref 19–32)
CREATININE: 1.07 mg/dL (ref 0.40–1.20)
Chloride: 102 mEq/L (ref 96–112)
GFR: 51.35 mL/min — ABNORMAL LOW (ref >60.00)
Glucose, Bld: 161 mg/dL — ABNORMAL HIGH (ref 70–99)
Potassium: 4.4 mEq/L (ref 3.5–5.1)
Sodium: 137 mEq/L (ref 135–145)

## 2014-11-30 LAB — AST: AST: 19 U/L (ref 0–37)

## 2014-12-02 ENCOUNTER — Other Ambulatory Visit: Payer: Medicare Other

## 2014-12-08 ENCOUNTER — Encounter: Payer: Self-pay | Admitting: Internal Medicine

## 2014-12-17 ENCOUNTER — Emergency Department (HOSPITAL_COMMUNITY): Payer: Medicare Other

## 2014-12-17 ENCOUNTER — Encounter (HOSPITAL_COMMUNITY): Payer: Self-pay | Admitting: *Deleted

## 2014-12-17 ENCOUNTER — Observation Stay (HOSPITAL_COMMUNITY)
Admission: EM | Admit: 2014-12-17 | Discharge: 2014-12-18 | Disposition: A | Discharge status: Home / Self Care | Payer: Medicare Other | Attending: Internal Medicine | Admitting: Internal Medicine

## 2014-12-17 DIAGNOSIS — Z880 Allergy status to penicillin: Secondary | ICD-10-CM | POA: Insufficient documentation

## 2014-12-17 DIAGNOSIS — R079 Chest pain, unspecified: Secondary | ICD-10-CM | POA: Diagnosis not present

## 2014-12-17 DIAGNOSIS — Z85828 Personal history of other malignant neoplasm of skin: Secondary | ICD-10-CM | POA: Diagnosis not present

## 2014-12-17 DIAGNOSIS — K219 Gastro-esophageal reflux disease without esophagitis: Secondary | ICD-10-CM | POA: Diagnosis not present

## 2014-12-17 DIAGNOSIS — E669 Obesity, unspecified: Secondary | ICD-10-CM | POA: Diagnosis not present

## 2014-12-17 DIAGNOSIS — K589 Irritable bowel syndrome without diarrhea: Secondary | ICD-10-CM | POA: Diagnosis not present

## 2014-12-17 DIAGNOSIS — Z9861 Coronary angioplasty status: Secondary | ICD-10-CM | POA: Diagnosis not present

## 2014-12-17 DIAGNOSIS — E119 Type 2 diabetes mellitus without complications: Secondary | ICD-10-CM

## 2014-12-17 DIAGNOSIS — E785 Hyperlipidemia, unspecified: Secondary | ICD-10-CM | POA: Diagnosis not present

## 2014-12-17 DIAGNOSIS — F0390 Unspecified dementia without behavioral disturbance: Secondary | ICD-10-CM | POA: Diagnosis not present

## 2014-12-17 DIAGNOSIS — E1140 Type 2 diabetes mellitus with diabetic neuropathy, unspecified: Secondary | ICD-10-CM | POA: Insufficient documentation

## 2014-12-17 DIAGNOSIS — Z7901 Long term (current) use of anticoagulants: Secondary | ICD-10-CM

## 2014-12-17 DIAGNOSIS — I4891 Unspecified atrial fibrillation: Secondary | ICD-10-CM | POA: Diagnosis present

## 2014-12-17 DIAGNOSIS — Z7982 Long term (current) use of aspirin: Secondary | ICD-10-CM | POA: Insufficient documentation

## 2014-12-17 DIAGNOSIS — I2510 Atherosclerotic heart disease of native coronary artery without angina pectoris: Secondary | ICD-10-CM | POA: Diagnosis not present

## 2014-12-17 DIAGNOSIS — N179 Acute kidney failure, unspecified: Secondary | ICD-10-CM | POA: Insufficient documentation

## 2014-12-17 DIAGNOSIS — I480 Paroxysmal atrial fibrillation: Secondary | ICD-10-CM | POA: Diagnosis not present

## 2014-12-17 DIAGNOSIS — I951 Orthostatic hypotension: Secondary | ICD-10-CM | POA: Diagnosis not present

## 2014-12-17 DIAGNOSIS — Z8701 Personal history of pneumonia (recurrent): Secondary | ICD-10-CM | POA: Diagnosis not present

## 2014-12-17 DIAGNOSIS — I482 Chronic atrial fibrillation: Secondary | ICD-10-CM | POA: Diagnosis not present

## 2014-12-17 DIAGNOSIS — R0789 Other chest pain: Secondary | ICD-10-CM | POA: Diagnosis present

## 2014-12-17 DIAGNOSIS — D649 Anemia, unspecified: Secondary | ICD-10-CM | POA: Insufficient documentation

## 2014-12-17 DIAGNOSIS — M1990 Unspecified osteoarthritis, unspecified site: Secondary | ICD-10-CM | POA: Diagnosis not present

## 2014-12-17 DIAGNOSIS — R110 Nausea: Secondary | ICD-10-CM | POA: Insufficient documentation

## 2014-12-17 DIAGNOSIS — Z79899 Other long term (current) drug therapy: Secondary | ICD-10-CM | POA: Diagnosis not present

## 2014-12-17 DIAGNOSIS — I10 Essential (primary) hypertension: Secondary | ICD-10-CM | POA: Diagnosis not present

## 2014-12-17 DIAGNOSIS — R0602 Shortness of breath: Secondary | ICD-10-CM | POA: Diagnosis not present

## 2014-12-17 LAB — BASIC METABOLIC PANEL
Anion gap: 11 (ref 5–15)
BUN: 13 mg/dL (ref 6–20)
CALCIUM: 9.9 mg/dL (ref 8.9–10.3)
CHLORIDE: 101 mmol/L (ref 101–111)
CO2: 27 mmol/L (ref 22–32)
Creatinine, Ser: 1.05 mg/dL — ABNORMAL HIGH (ref 0.44–1.00)
GFR calc non Af Amer: 46 mL/min — ABNORMAL LOW (ref >60)
GFR, EST AFRICAN AMERICAN: 53 mL/min — AB (ref >60)
GLUCOSE: 115 mg/dL — AB (ref 65–99)
Potassium: 4.5 mmol/L (ref 3.5–5.1)
SODIUM: 139 mmol/L (ref 135–145)

## 2014-12-17 LAB — CBC
HCT: 41.3 % (ref 36.0–46.0)
HEMOGLOBIN: 14 g/dL (ref 12.0–15.0)
MCH: 31.2 pg (ref 26.0–34.0)
MCHC: 33.9 g/dL (ref 30.0–36.0)
MCV: 92 fL (ref 78.0–100.0)
PLATELETS: 175 10*3/uL (ref 150–400)
RBC: 4.49 MIL/uL (ref 3.87–5.11)
RDW: 13.3 % (ref 11.5–15.5)
WBC: 5.2 10*3/uL (ref 4.0–10.5)

## 2014-12-17 LAB — I-STAT TROPONIN, ED: Troponin i, poc: 0 ng/mL (ref 0.00–0.08)

## 2014-12-17 LAB — BRAIN NATRIURETIC PEPTIDE: B Natriuretic Peptide: 234.4 pg/mL — ABNORMAL HIGH (ref 0.0–100.0)

## 2014-12-17 LAB — GLUCOSE, CAPILLARY: Glucose-Capillary: 156 mg/dL — ABNORMAL HIGH (ref 65–99)

## 2014-12-17 LAB — TROPONIN I

## 2014-12-17 MED ORDER — PANTOPRAZOLE SODIUM 40 MG PO TBEC
40.0000 mg | DELAYED_RELEASE_TABLET | Freq: Every day | ORAL | Status: DC
  Administered 2014-12-17 – 2014-12-18 (×2): 40 mg via ORAL
  Filled 2014-12-17 (×2): qty 1

## 2014-12-17 MED ORDER — ONDANSETRON HCL 4 MG/2ML IJ SOLN: 4.0000 mg | Freq: Four times a day (QID) | INTRAMUSCULAR | Status: DC | PRN

## 2014-12-17 MED ORDER — GLIPIZIDE ER 5 MG PO TB24
5.0000 mg | ORAL_TABLET | Freq: Every day | ORAL | Status: DC
  Filled 2014-12-17 (×2): qty 1

## 2014-12-17 MED ORDER — ASPIRIN 81 MG PO CHEW: 324.0000 mg | CHEWABLE_TABLET | ORAL | Status: DC

## 2014-12-17 MED ORDER — REGADENOSON 0.4 MG/5ML IV SOLN
0.4000 mg | Freq: Once | INTRAVENOUS | Status: AC
  Administered 2014-12-18: 0.4 mg via INTRAVENOUS
  Filled 2014-12-17: qty 5

## 2014-12-17 MED ORDER — GABAPENTIN 600 MG PO TABS
600.0000 mg | ORAL_TABLET | Freq: Three times a day (TID) | ORAL | Status: DC
  Administered 2014-12-17 – 2014-12-18 (×3): 600 mg via ORAL
  Filled 2014-12-17 (×3): qty 1

## 2014-12-17 MED ORDER — ROSUVASTATIN CALCIUM 10 MG PO TABS
10.0000 mg | ORAL_TABLET | Freq: Every day | ORAL | Status: DC
  Administered 2014-12-17 – 2014-12-18 (×2): 10 mg via ORAL
  Filled 2014-12-17 (×2): qty 1

## 2014-12-17 MED ORDER — ASPIRIN 325 MG PO TABS
325.0000 mg | ORAL_TABLET | Freq: Every day | ORAL | Status: DC
  Administered 2014-12-17: 325 mg via ORAL
  Filled 2014-12-17: qty 1

## 2014-12-17 MED ORDER — NITROGLYCERIN 0.4 MG SL SUBL
0.4000 mg | SUBLINGUAL_TABLET | SUBLINGUAL | Status: DC | PRN
  Administered 2014-12-17: 0.4 mg via SUBLINGUAL
  Filled 2014-12-17: qty 1

## 2014-12-17 MED ORDER — NITROGLYCERIN 0.4 MG SL SUBL: 0.4000 mg | SUBLINGUAL_TABLET | SUBLINGUAL | Status: DC | PRN

## 2014-12-17 MED ORDER — ASPIRIN 81 MG PO TBEC: 81.0000 mg | DELAYED_RELEASE_TABLET | Freq: Every day | ORAL | Status: DC

## 2014-12-17 MED ORDER — ASPIRIN 300 MG RE SUPP: 300.0000 mg | RECTAL | Status: DC

## 2014-12-17 MED ORDER — AMLODIPINE BESYLATE 2.5 MG PO TABS
2.5000 mg | ORAL_TABLET | Freq: Every day | ORAL | Status: DC
  Administered 2014-12-17: 2.5 mg via ORAL
  Filled 2014-12-17: qty 1

## 2014-12-17 MED ORDER — ACETAMINOPHEN 325 MG PO TABS
650.0000 mg | ORAL_TABLET | ORAL | Status: DC | PRN
  Administered 2014-12-17: 650 mg via ORAL
  Filled 2014-12-17: qty 2

## 2014-12-17 MED ORDER — ASPIRIN EC 81 MG PO TBEC
81.0000 mg | DELAYED_RELEASE_TABLET | Freq: Every day | ORAL | Status: DC
  Administered 2014-12-18: 81 mg via ORAL
  Filled 2014-12-17: qty 1

## 2014-12-17 MED ORDER — APIXABAN 2.5 MG PO TABS
2.5000 mg | ORAL_TABLET | Freq: Two times a day (BID) | ORAL | Status: DC
  Administered 2014-12-17 – 2014-12-18 (×2): 2.5 mg via ORAL
  Filled 2014-12-17 (×2): qty 1

## 2014-12-17 NOTE — ED Notes (Signed)
Pt. Offered second nitroglycerin tablet. Pt. States she "would like to wait a while."

## 2014-12-17 NOTE — H&P (Signed)
Patient ID: [name] MRN: [id number], DOB/AGE: [birth year]   Admit date: 12/17/2014   Primary Physician: Chesley Noon, MD Primary Cardiologist: Dr. Harrington Challenger  Pt. Profile:  81 y/o female with known CAD with prior LAD stenting in 2005 and repeat PCI in 2006 for ISR, atrial fibrillation on chronic Eliquis, HTN, HLD and DM, presenting with chest pain.   Problem List  Past Medical History  Diagnosis Date  . Allergic rhinitis   . Anemia     NOS chronic disease. Hgb 11.6gm% 04/14/2009  . CAD (coronary artery disease)     a. s/p DES mLAD 2005. b. Cutting balloon angioplasty for ISR 2006. c. LHC 08/2010 nonobstructive. d. Myoview 06/2012: small anteroapical infarct/mod inferolat wall infarct but no ischemia, EF 75%.  . Diverticulitis of colon   . GERD (gastroesophageal reflux disease)   . IBS (irritable bowel syndrome)   . Spinal stenosis     djd lumbar  . Multinodular goiter   . Orthostatic hypotension   . Obesity     BMI-37  . Acute renal failure     due to dehydration-Sept 2009; Normal creat 1.2 on fu 05/13/2009  . Hx of colonoscopy   . Cancer     on nose and had it removed  . DEMENTIA     slight  . Osteoarthritis     lower back  . Pneumonia   . PAF (paroxysmal atrial fibrillation)     noted on event monitor. No correlation with episodes and symptoms  . Chronic anticoagulation   . RBBB   . 1St degree AV block   . Diabetes mellitus type II   . DM neuropathy, type II diabetes mellitus   . Hyperlipidemia   . HTN (hypertension)     Past Surgical History  Procedure Laterality Date  . Cholecystectomy    . Abdominal hysterectomy    . Nasal sinus surgery      x2  . Bilateral arthroscopic knee surgery    . Stent surgery    . Coronary angioplasty      stent  . Carpel tunnel wrist rt       Allergies  Allergies  Allergen Reactions  . Amoxicillin Other (See Comments)    UNKNOWN    HPI  The patient is a 81 y/o female, followed by Dr. Harrington Challenger. Her PMH is  significant for CAD s/p DES to the mid LAD in 2005 and cutting balloon angioplasty in 2006 for ISR. Her last ischemic eval was a NST in 2013 which showed small anteroapical infarct/mod inferolat wall infarct but no ischemia, EF 75%.  She also has h/o atrial fibrillation, HTN, DM, HLD and chronic RBBB. She is on chronic anticoagulation with Eliquis. 2D echo 11/21/2011 showed normal EF at 55% with normal wall motion.   She presents to the [facility] ED today with a 1 day history of intermittent left arm and chest pain. She cannot recall the exact symptoms she had in 2005 and 2006 prior to undergoing PCI of her LAD. Her pain started last PM at rest. Initially started in left arm then progressed to her substernal area. Arm discomfort described as an achy pain and chest discomfort described as pressure like. No exacerbating factors. Non exertional. Has been relived with SL NTG. Episodes last ~5 minutes. No associated dyspnea, syncope/ near syncope. Has had associated nausea but no vomiting. Also notes bilateral LEE (currenly wearing compression stockings). Reports full medication compliance.   Currently in ED, she is CP free. CXR is  unremarkable. BNP slightly elevated at 234. BMP notable for mild renal insufficieny with SCr at 1.05. CBC unremarkable. Initial POC troponin is negative.  EKG shows atrial fibrillation with a CVR of 75 bpm and chronic RBBB. BP is moderately elevated in the 101B systolic.      Home Medications  Prior to Admission medications   Medication Sig Start Date End Date Taking? Authorizing Provider  acetaminophen (TYLENOL) 650 MG CR tablet Take 650 mg by mouth as directed.    Historical Provider, MD  alendronate (FOSAMAX) 70 MG tablet Take 1 tablet (70 mg total) by mouth every 7 (seven) days. Take with a full glass of water on an empty stomach. 12/08/12   Lonie Peak Dixon, PA-C  amLODipine (NORVASC) 2.5 MG tablet Take 2.5 mg by mouth at bedtime.     Historical Provider, MD  apixaban (ELIQUIS) 5 MG TABS  tablet Take 0.5 tablets (2.5 mg total) by mouth 2 (two) times daily. 11/19/14   Fay Records, MD  aspirin 81 MG EC tablet Take 81 mg by mouth daily.      Historical Provider, MD  Calcium Carbonate-Vit D-Min (CALCIUM 1200 PO) Take 1 tablet by mouth 2 (two) times daily.     Historical Provider, MD  gabapentin (NEURONTIN) 600 MG tablet Take 600 mg by mouth 3 (three) times daily.  10/25/13   Historical Provider, MD  glipiZIDE (GLUCOTROL XL) 5 MG 24 hr tablet Take 5 mg by mouth daily with breakfast.    Historical Provider, MD  Multiple Vitamins-Minerals (CENTRUM SILVER PO) Take 1 tablet by mouth daily.     Historical Provider, MD  Multiple Vitamins-Minerals (PRESERVISION AREDS 2 PO) Take 2 tablets by mouth daily.    Historical Provider, MD  nitroGLYCERIN (NITROSTAT) 0.4 MG SL tablet Place 1 tablet (0.4 mg total) under the tongue every 5 (five) minutes as needed for chest pain (up to 3 doses). 07/18/12   Dayna N Dunn, PA-C  polyvinyl alcohol (LIQUIFILM TEARS) 1.4 % ophthalmic solution Place 2 drops into both eyes as needed (dry eyes.).    Historical Provider, MD  rosuvastatin (CRESTOR) 10 MG tablet Take 10 mg by mouth daily.    Historical Provider, MD  traMADol (ULTRAM) 50 MG tablet Take 1 tablet (50 mg total) by mouth every 6 (six) hours as needed for pain. 09/21/12   Charlann Lange, PA-C  TRUETEST TEST test strip 2 (two) times daily. 12/01/12   Historical Provider, MD    Family History  Family History  Problem Relation Age of Onset  . Breast cancer Sister   . Heart disease Brother   . Colon cancer Neg Hx   . Heart attack Son   . Heart attack Daughter   . Heart attack Father   . Stroke Neg Hx     Social History  History   Social History  . Marital Status: Married    Spouse Name: N/A  . Number of Children: N/A  . Years of Education: N/A   Occupational History  . retired    Social History Main Topics  . Smoking status: Never Smoker   . Smokeless tobacco: Never Used  . Alcohol Use: No  .  Drug Use: No  . Sexual Activity: No   Other Topics Concern  . Not on file   Social History Narrative     Review of Systems General:  No chills, fever, night sweats or weight changes.  Cardiovascular:  No chest pain, dyspnea on exertion, edema, orthopnea, palpitations, paroxysmal nocturnal dyspnea.  Dermatological: No rash, lesions/masses Respiratory: No cough, dyspnea Urologic: No hematuria, dysuria Abdominal:   No nausea, vomiting, diarrhea, bright red blood per rectum, melena, or hematemesis Neurologic:  No visual changes, wkns, changes in mental status. All other systems reviewed and are otherwise negative except as noted above.  Physical Exam  Blood pressure 159/85, pulse 68, temperature 97.7 F (36.5 C), temperature source Oral, resp. rate 22, SpO2 96 %.  General: Pleasant, NAD Psych: Normal affect. Neuro: Alert and oriented X 3. Moves all extremities spontaneously. HEENT: Normal  Neck: Supple without bruits or JVD. Lungs:  Resp regular and unlabored, CTA. Heart: RRR no s3, s4, or murmurs. Abdomen: Soft, non-tender, non-distended, BS + x 4.  Extremities: No clubbing, cyanosis or edema. DP/PT/Radials 2+ and equal bilaterally.  Labs  Troponin [facility] Test)  Recent Labs  12/17/14 1325  TROPIPOC 0.00   No results for input(s): CKTOTAL, CKMB, TROPONINI in the last 72 hours. Lab Results  Component Value Date   WBC 5.2 12/17/2014   HGB 14.0 12/17/2014   HCT 41.3 12/17/2014   MCV 92.0 12/17/2014   PLT 175 12/17/2014    Recent Labs Lab 12/17/14 1320  NA 139  K 4.5  CL 101  CO2 27  BUN 13  CREATININE 1.05*  CALCIUM 9.9  GLUCOSE 115*   Lab Results  Component Value Date   CHOL 117 11/30/2014   HDL 56.10 11/30/2014   LDLCALC 45 11/30/2014   TRIG 80.0 11/30/2014   Lab Results  Component Value Date   DDIMER 0.80* 07/17/2012     Radiology/Studies  Dg Chest 2 View  12/17/2014   CLINICAL DATA:  All over chest pain, left arm pain, no SOB x 1  day.HX of HTN-on meds, DM- on meds, non smoker, Hx of having stents put in many years ago  EXAM: CHEST  2 VIEW  COMPARISON:  05/05/2014  FINDINGS: Cardiac silhouette is normal in size and configuration. Aorta is mildly uncoiled. No mediastinal or hilar masses or evidence of adenopathy.  Lungs are mildly hyperexpanded. There is mild prominence of the interstitial markings in the lung bases. No lung consolidation or edema. No pleural effusion or pneumothorax.  Bony thorax is demineralized but grossly intact.  IMPRESSION: No acute cardiopulmonary disease.   Electronically Signed   By: Lajean Manes M.D.   On: 12/17/2014 13:39    ECG  Atrial fibrillation with CVR. Known RBBB.    ASSESSMENT AND PLAN  1. Chest Pain/CAD: h/o PCI + DES to LAD in 2005 with repeat angioplasty in 2006 for ISR. Has multiple other CR including HTN, HLD and DM. Now with  intermittent left arm and chest discomfort. Relieved with SL NTG. Initial troponin is negative. EKG shows atrial fibrillation with CVR and chronic RBBB. Currently CP free. Doubt PE given reported full compliance with Eliquis. Recommend overnight observation. Cycle cardiac enzymes x 3. Will plan for NST in the am to r/o ischemia if normal enzymes. Continue home meds: ASA and Crestor. Consider low dose BB.   2. Atrial Fibrillation: CVR. Asymptomatic. Keep on telemetry. Continue Eliquis.   3. HTN: continue home meds  4. HLD: recent lipid panel 11/30/14 showed LDL well controlled at 45 mg/dL. Continue Crestor.  5. DM: continue Glipizide.   Janee Morn, PA-C 12/17/2014, 4:42 PM  Patient seen and examined  Agree with findings as noted above by [place] familiar to me in clinic  I just saw her several wks ago She says over the past  few wks she has had increased SOB with activity  Last night developed L arm pain then chest discomfort  Went to bed  WOke up with chest discomfort  This has persisted. Discomfort eased in ER after NTG given     Patient does not increased reflux.   Exam unremarkable  Tele:  Chronic afib  Rates good  Plan to admit  R/o for MI   If r/o would recomm lexiscan myoview to evaluate for ischemia    Continue Eliquis. Empiric protonix.    Dorris Carnes

## 2014-12-17 NOTE — ED Provider Notes (Signed)
CSN: [id number]     Arrival date & time 12/17/14  1255 History   None    Chief Complaint  Patient presents with  . Chest Pain  . Shortness of Breath   [name] is a 81 y.o. female with a past medical history significant for coronary artery disease status post stenting, diverticulitis, dementia, atrial fibrillation on the liquids, hypertension, diabetes, and hyperlipidemia who presents with chest pain. The patient reports that while sitting in her recliner last night, the patient had gradual onset of chest pain. The patient described the pain as pressure-like in her central chest. The patient described the pain radiating down her left arm. The patient says the pain has been coming and going and does not appear to be exertional. The patient denies pleurisy. The patient reports associated shortness of breath and nausea with no vomiting. The patient denies diaphoresis. The patient described the pain as as bad as a 9 out of 10 in severity however she describes the pain as a 5 out of 10 currently. The patient says that she has taken her aspirin this morning but has not taken anymore. The patient says that this is a new pain for her. The patient reports that she is anticoagulated with a liquids for her atrial fibrillation. The patient denies any pain to palpation of her chest, denies any fevers, chills, abdominal pain, constipation, diarrhea, dysuria. The patient denies any cough or recent change in medications.   (Consider location/radiation/quality/duration/timing/severity/associated sxs/prior Treatment) Patient is a 81 y.o. female presenting with chest pain. The history is provided by the patient and a relative (granddaughter). No language interpreter was used.  Chest Pain Pain location:  Substernal area Pain quality: crushing, pressure and radiating   Pain radiates to:  L arm Pain radiates to the back: no   Pain severity:  Severe Onset quality:  Gradual Duration:  1 day Timing:   Intermittent Progression:  Waxing and waning Chronicity:  New Context: at rest   Relieved by:  Nothing Worsened by:  Nothing tried Associated symptoms: nausea and shortness of breath   Associated symptoms: no back pain, no cough, no diaphoresis, no fatigue, no fever, no headache, no palpitations, not vomiting and no weakness     Past Medical History  Diagnosis Date  . Allergic rhinitis   . Anemia     NOS chronic disease. Hgb 11.6gm% 04/14/2009  . CAD (coronary artery disease)     a. s/p DES mLAD 2005. b. Cutting balloon angioplasty for ISR 2006. c. LHC 08/2010 nonobstructive. d. Myoview 06/2012: small anteroapical infarct/mod inferolat wall infarct but no ischemia, EF 75%.  . Diverticulitis of colon   . GERD (gastroesophageal reflux disease)   . IBS (irritable bowel syndrome)   . Spinal stenosis     djd lumbar  . Multinodular goiter   . Orthostatic hypotension   . Obesity     BMI-37  . Acute renal failure     due to dehydration-Sept 2009; Normal creat 1.2 on fu 05/13/2009  . Hx of colonoscopy   . Cancer     on nose and had it removed  . DEMENTIA     slight  . Osteoarthritis     lower back  . Pneumonia   . PAF (paroxysmal atrial fibrillation)     noted on event monitor. No correlation with episodes and symptoms  . Chronic anticoagulation   . RBBB   . 1St degree AV block   . Diabetes mellitus type II   .  DM neuropathy, type II diabetes mellitus   . Hyperlipidemia   . HTN (hypertension)    Past Surgical History  Procedure Laterality Date  . Cholecystectomy    . Abdominal hysterectomy    . Nasal sinus surgery      x2  . Bilateral arthroscopic knee surgery    . Stent surgery    . Coronary angioplasty      stent  . Carpel tunnel wrist rt     Family History  Problem Relation Age of Onset  . Breast cancer Sister   . Heart disease Brother   . Colon cancer Neg Hx   . Heart attack Son   . Heart attack Daughter   . Heart attack Father   . Stroke Neg Hx    History   Substance Use Topics  . Smoking status: Never Smoker   . Smokeless tobacco: Never Used  . Alcohol Use: No   OB History    No data available     Review of Systems  Constitutional: Negative for fever, chills, diaphoresis and fatigue.  HENT: Negative for congestion and rhinorrhea.   Eyes: Negative for photophobia.  Respiratory: Positive for shortness of breath. Negative for cough, chest tightness, wheezing and stridor.   Cardiovascular: Positive for chest pain. Negative for palpitations and leg swelling.  Gastrointestinal: Positive for nausea. Negative for vomiting, diarrhea and constipation.  Genitourinary: Negative for dysuria.  Musculoskeletal: Negative for back pain, neck pain and neck stiffness.  Skin: Negative for rash and wound.  Neurological: Negative for seizures, syncope, weakness, light-headedness and headaches.  Psychiatric/Behavioral: Negative for confusion and agitation.  All other systems reviewed and are negative.     Allergies  Amoxicillin  Home Medications   Prior to Admission medications   Medication Sig Start Date End Date Taking? Authorizing Provider  acetaminophen (TYLENOL) 650 MG CR tablet Take 650 mg by mouth as directed.    Historical Provider, MD  alendronate (FOSAMAX) 70 MG tablet Take 1 tablet (70 mg total) by mouth every 7 (seven) days. Take with a full glass of water on an empty stomach. 12/08/12   Lonie Peak Dixon, PA-C  amLODipine (NORVASC) 2.5 MG tablet Take 2.5 mg by mouth at bedtime.     Historical Provider, MD  apixaban (ELIQUIS) 5 MG TABS tablet Take 0.5 tablets (2.5 mg total) by mouth 2 (two) times daily. 11/19/14   Fay Records, MD  aspirin 81 MG EC tablet Take 81 mg by mouth daily.      Historical Provider, MD  Calcium Carbonate-Vit D-Min (CALCIUM 1200 PO) Take 1 tablet by mouth 2 (two) times daily.     Historical Provider, MD  gabapentin (NEURONTIN) 600 MG tablet Take 600 mg by mouth 3 (three) times daily.  10/25/13   Historical Provider, MD   glipiZIDE (GLUCOTROL XL) 5 MG 24 hr tablet Take 5 mg by mouth daily with breakfast.    Historical Provider, MD  Multiple Vitamins-Minerals (CENTRUM SILVER PO) Take 1 tablet by mouth daily.     Historical Provider, MD  Multiple Vitamins-Minerals (PRESERVISION AREDS 2 PO) Take 2 tablets by mouth daily.    Historical Provider, MD  nitroGLYCERIN (NITROSTAT) 0.4 MG SL tablet Place 1 tablet (0.4 mg total) under the tongue every 5 (five) minutes as needed for chest pain (up to 3 doses). 07/18/12   Dayna N Dunn, PA-C  polyvinyl alcohol (LIQUIFILM TEARS) 1.4 % ophthalmic solution Place 2 drops into both eyes as needed (dry eyes.).    Historical Provider,  MD  rosuvastatin (CRESTOR) 10 MG tablet Take 10 mg by mouth daily.    Historical Provider, MD  traMADol (ULTRAM) 50 MG tablet Take 1 tablet (50 mg total) by mouth every 6 (six) hours as needed for pain. 09/21/12   Charlann Lange, PA-C  TRUETEST TEST test strip 2 (two) times daily. 12/01/12   Historical Provider, MD   BP 145/64 mmHg  Pulse 70  Temp(Src) 97.7 F (36.5 C) (Oral)  Resp 15  SpO2 99% Physical Exam  Constitutional: She is oriented to person, place, and time. She appears well-developed and well-nourished. No distress.  HENT:  Head: Normocephalic.  Mouth/Throat: No oropharyngeal exudate.  Eyes: Conjunctivae and EOM are normal. Pupils are equal, round, and reactive to light. No scleral icterus.  Neck: Normal range of motion. Neck supple.  Cardiovascular: Normal rate, normal heart sounds and intact distal pulses.   No murmur heard. Pulmonary/Chest: Effort normal and breath sounds normal. No stridor. No respiratory distress. She has no wheezes. She exhibits no tenderness.  Abdominal: Soft. Bowel sounds are normal. She exhibits no distension. There is no tenderness. There is no rebound.  Musculoskeletal: She exhibits no tenderness.  Neurological: She is alert and oriented to person, place, and time. She exhibits normal muscle tone.  Skin: Skin  is warm. She is not diaphoretic. No pallor.  Psychiatric: She has a normal mood and affect.  Nursing note and vitals reviewed.   ED Course  Procedures (including critical care time) Labs Review Labs Reviewed  BASIC METABOLIC PANEL - Abnormal; Notable for the following:    Glucose, Bld 115 (*)    Creatinine, Ser 1.05 (*)    GFR calc non Af Amer 46 (*)    GFR calc Af Amer 53 (*)    All other components within normal limits  BRAIN NATRIURETIC PEPTIDE - Abnormal; Notable for the following:    B Natriuretic Peptide 234.4 (*)    All other components within normal limits  GLUCOSE, CAPILLARY - Abnormal; Notable for the following:    Glucose-Capillary 156 (*)    All other components within normal limits  MRSA PCR SCREENING  CBC  TROPONIN I  TROPONIN I  TROPONIN I  BASIC METABOLIC PANEL  I-STAT TROPOININ, ED    Imaging Review Dg Chest 2 View  12/17/2014   CLINICAL DATA:  All over chest pain, left arm pain, no SOB x 1 day.HX of HTN-on meds, DM- on meds, non smoker, Hx of having stents put in many years ago  EXAM: CHEST  2 VIEW  COMPARISON:  05/05/2014  FINDINGS: Cardiac silhouette is normal in size and configuration. Aorta is mildly uncoiled. No mediastinal or hilar masses or evidence of adenopathy.  Lungs are mildly hyperexpanded. There is mild prominence of the interstitial markings in the lung bases. No lung consolidation or edema. No pleural effusion or pneumothorax.  Bony thorax is demineralized but grossly intact.  IMPRESSION: No acute cardiopulmonary disease.   Electronically Signed   By: Lajean Manes M.D.   On: 12/17/2014 13:39     EKG Interpretation   Date/Time:  Friday Dec 17 2014 13:08:46 EDT Ventricular Rate:  75 PR Interval:    QRS Duration: 144 QT Interval:  416 QTC Calculation: 464 R Axis:   64 Text Interpretation:  Atrial fibrillation Right bundle branch block  Nonspecific T wave abnormality No significant change since last tracing  Confirmed by Ashok Cordia  MD, Lennette Bihari  ([id number]) on 12/17/2014 3:16:59 PM      MDM   Joaquim Lai  B Eppes is a 81 y.o. female with a past medical history significant for coronary artery disease status post stenting, diverticulitis, dementia, atrial fibrillation on the liquids, hypertension, diabetes, and hyperlipidemia who presents with chest pain. On arrival, the patient reported her chest pain was a 5 out of 10 in severity. The patient also denied taking extra aspirin prior to arrival. The patient was given aspirin and will be given sublingual nitroglycerin to try alleviate her pain. The patient's    Initial diagnostic laboratory testing results are seen above. The patient's initial troponin was negative. Her CBC was unremarkable, and her BMP was also unremarkable. The patient's BNP was slightly elevated at 234. The patient's chest x-ray showed no acute pulmonary abnormality.   On reassessment, the patient reports that the supplemental nitroglycerin greatly improved her chest pain now to a 2 out of 10 in severity. Plan to call the cardiology team for management recommendations.  The patient was admitted to the internal medicine service for further management and chest pain rule out. The patient had no other problems in the emergency department and was admitted in stable condition.  This patient was seen with Dr. Ashok Cordia, Emergency medicine attending.  Final diagnoses:  Chest pain     Antony Blackbird, MD 12/18/14 [place], MD 12/21/14 [phone]

## 2014-12-17 NOTE — ED Notes (Signed)
Pt states that she began having chest pain ans sob last night. Pt denies any worsening factors.

## 2014-12-17 NOTE — ED Notes (Signed)
MD at bedside. 

## 2014-12-18 ENCOUNTER — Observation Stay (HOSPITAL_COMMUNITY): Payer: Medicare Other

## 2014-12-18 DIAGNOSIS — R072 Precordial pain: Secondary | ICD-10-CM | POA: Diagnosis not present

## 2014-12-18 DIAGNOSIS — I10 Essential (primary) hypertension: Secondary | ICD-10-CM | POA: Diagnosis not present

## 2014-12-18 DIAGNOSIS — Z7901 Long term (current) use of anticoagulants: Secondary | ICD-10-CM | POA: Diagnosis not present

## 2014-12-18 DIAGNOSIS — R079 Chest pain, unspecified: Secondary | ICD-10-CM | POA: Diagnosis not present

## 2014-12-18 DIAGNOSIS — I481 Persistent atrial fibrillation: Secondary | ICD-10-CM | POA: Diagnosis not present

## 2014-12-18 LAB — GLUCOSE, CAPILLARY
GLUCOSE-CAPILLARY: 102 mg/dL — AB (ref 65–99)
Glucose-Capillary: 158 mg/dL — ABNORMAL HIGH (ref 65–99)

## 2014-12-18 LAB — BASIC METABOLIC PANEL
ANION GAP: 9 (ref 5–15)
BUN: 13 mg/dL (ref 6–20)
CHLORIDE: 101 mmol/L (ref 101–111)
CO2: 29 mmol/L (ref 22–32)
Calcium: 8.9 mg/dL (ref 8.9–10.3)
Creatinine, Ser: 1.15 mg/dL — ABNORMAL HIGH (ref 0.44–1.00)
GFR calc Af Amer: 48 mL/min — ABNORMAL LOW (ref >60)
GFR calc non Af Amer: 41 mL/min — ABNORMAL LOW (ref >60)
GLUCOSE: 122 mg/dL — AB (ref 65–99)
Potassium: 4.2 mmol/L (ref 3.5–5.1)
SODIUM: 139 mmol/L (ref 135–145)

## 2014-12-18 LAB — TROPONIN I: Troponin I: <0.03 ng/mL (ref <0.031)

## 2014-12-18 LAB — MRSA PCR SCREENING: MRSA BY PCR: NEGATIVE

## 2014-12-18 MED ORDER — ALENDRONATE SODIUM 70 MG PO TABS: 70.0000 mg | ORAL_TABLET | ORAL | Status: DC

## 2014-12-18 MED ORDER — PANTOPRAZOLE SODIUM 40 MG PO TBEC: 40.0000 mg | DELAYED_RELEASE_TABLET | Freq: Every day | ORAL | Status: DC

## 2014-12-18 MED ORDER — TECHNETIUM TC 99M SESTAMIBI - CARDIOLITE
30.0000 | Freq: Once | INTRAVENOUS | Status: AC | PRN
  Administered 2014-12-18: 12:00:00 30 via INTRAVENOUS

## 2014-12-18 MED ORDER — REGADENOSON 0.4 MG/5ML IV SOLN
INTRAVENOUS | Status: AC
  Administered 2014-12-18: 0.4 mg via INTRAVENOUS
  Filled 2014-12-18: qty 5

## 2014-12-18 MED ORDER — TECHNETIUM TC 99M SESTAMIBI GENERIC - CARDIOLITE
10.0000 | Freq: Once | INTRAVENOUS | Status: AC | PRN
  Administered 2014-12-18: 10 via INTRAVENOUS

## 2014-12-18 NOTE — Discharge Instructions (Signed)
Heart Healthy diabetic diet  Your stress test was normal but if continued problems please call  We added protonix 1 daily try for 1 week then decrease to if needed.  Your pain may have been from your stomach.  Continue eliquis.

## 2014-12-18 NOTE — Discharge Summary (Signed)
Physician Discharge Summary       Patient ID: [name] MRN: [id number] DOB/AGE: [birth year] 81 y.o.  Admit date: 12/17/2014 Discharge date: 12/18/2014 Primary Cardiologist:Dr. Harrington Challenger  Discharge Diagnoses:  Principal Problem:   Chest pain, negative MI, negative Nuc, may be GI Active Problems:   Atrial fibrillation, permanent   DM2 (diabetes mellitus, type 2)   Hyperlipidemia   Chronic anticoagulation for A fib, CHA2DS2VASc = 6   CAD in native artery   Discharged Condition: good  Procedures: none  Hospital Course:  81 y/o female, followed by Dr. Harrington Challenger. Her PMH is significant for CAD s/p DES to the mid LAD in 2005 and cutting balloon angioplasty in 2006 for ISR. Her last ischemic eval was a NST in 2013 which showed small anteroapical infarct/mod inferolat wall infarct but no ischemia, EF 75%. She also has h/o atrial fibrillation, HTN, DM, HLD and chronic RBBB. She is on chronic anticoagulation with Eliquis. 2D echo 11/21/2011 showed normal EF at 55% with normal wall motion.   She presents to the [facility] ED 12/17/14 with a 1 day history of intermittent left arm and chest pain. She cannot recall the exact symptoms she had in 2005 and 2006 prior to undergoing PCI of her LAD. Her pain started last PM at rest. Initially started in left arm then progressed to her substernal area. Arm discomfort described as an achy pain and chest discomfort described as pressure like. No exacerbating factors. Non exertional. Has been relived with SL NTG. Episodes last ~5 minutes. No associated dyspnea, syncope/ near syncope. Has had associated nausea but no vomiting. Also notes bilateral LEE (currenly wearing compression stockings). Reports full medication compliance.   In ED, she was CP free. CXR is unremarkable. BNP slightly elevated at 234. BMP notable for mild renal insufficieny with SCr at 1.05. CBC unremarkable. Initial POC troponin is negative. EKG showed atrial fibrillation with a CVR of 75 bpm and chronic  RBBB. BP is moderately elevated in the 947M systolic.   She was admitted for OBS with plan for nuc study.  Her Troponins were negative.  Lexiscan myoview was negative for ischemia.  She asked in the hall and ahd not had any further pain.  Was discharged hom after exam by Dr. Meda Coffee and found stable to go. We added protonix for 1 week then PRN for possible GI source.  She will follow up in the office.    Consults: None  Significant Diagnostic Studies:   BMP Latest Ref Rng 12/18/2014 12/17/2014 11/30/2014  Glucose 65 - 99 mg/dL 122(H) 115(H) 161(H)  BUN 6 - 20 mg/dL 13 13 18   Creatinine 0.44 - 1.00 mg/dL 1.15(H) 1.05(H) 1.07  Sodium 135 - 145 mmol/L 139 139 137  Potassium 3.5 - 5.1 mmol/L 4.2 4.5 4.4  Chloride 101 - 111 mmol/L 101 101 102  CO2 22 - 32 mmol/L 29 27 32  Calcium 8.9 - 10.3 mg/dL 8.9 9.9 9.3   CBC    Component Value Date/Time   WBC 5.2 12/17/2014 1320   RBC 4.49 12/17/2014 1320   HGB 14.0 12/17/2014 1320   HCT 41.3 12/17/2014 1320   PLT 175 12/17/2014 1320   MCV 92.0 12/17/2014 1320   MCH 31.2 12/17/2014 1320   MCHC 33.9 12/17/2014 1320   RDW 13.3 12/17/2014 1320   LYMPHSABS 1.5 05/05/2014 2117   MONOABS 0.6 05/05/2014 2117   EOSABS 0.2 05/05/2014 2117   BASOSABS 0.1 05/05/2014 2117    Troponin  <0.03 X 3  BNP 234  CHEST 2 VIEW COMPARISON: 05/05/2014 FINDINGS: Cardiac silhouette is normal in size and configuration. Aorta is mildly uncoiled. No mediastinal or hilar masses or evidence of adenopathy. Lungs are mildly hyperexpanded. There is mild prominence of the interstitial markings in the lung bases. No lung consolidation or edema. No pleural effusion or pneumothorax. Bony thorax is demineralized but grossly intact. IMPRESSION: No acute cardiopulmonary disease.   LEXISCAN MYOVIEW:  The study is normal.  This is a low risk study.  The left ventricular ejection fraction is hyperdynamic (>65%).   Discharge Exam: Blood pressure 117/68, pulse  75, temperature 98.1 F (36.7 C), temperature source Oral, resp. rate 20, height 5\' 2"  (1.575 m), weight 175 lb 3.2 oz (79.47 kg), SpO2 100 %.   Disposition: 01-Home or Self Care     Medication List    TAKE these medications        acetaminophen 650 MG CR tablet  Commonly known as:  TYLENOL  Take 650 mg by mouth every 8 (eight) hours as needed for pain.     alendronate 70 MG tablet  Commonly known as:  FOSAMAX  Take 1 tablet (70 mg total) by mouth every 7 (seven) days. Take on Tuesdays with a full glass of water on an empty stomach.     amLODipine 2.5 MG tablet  Commonly known as:  NORVASC  Take 2.5 mg by mouth at bedtime.     apixaban 5 MG Tabs tablet  Commonly known as:  ELIQUIS  Take 0.5 tablets (2.5 mg total) by mouth 2 (two) times daily.     aspirin 81 MG EC tablet  Take 81 mg by mouth daily.     CALCIUM 1200 PO  Take 1 tablet by mouth 2 (two) times daily.     PRESERVISION AREDS 2 PO  Take 2 tablets by mouth daily.     CENTRUM SILVER PO  Take 1 tablet by mouth daily.     diclofenac sodium 1 % Gel  Commonly known as:  VOLTAREN  Place 1 application onto the skin 3 (three) times daily as needed (hand pain).     gabapentin 600 MG tablet  Commonly known as:  NEURONTIN  Take 600 mg by mouth 3 (three) times daily.     glipiZIDE 5 MG 24 hr tablet  Commonly known as:  GLUCOTROL XL  Take 5 mg by mouth daily with breakfast.     nitroGLYCERIN 0.4 MG SL tablet  Commonly known as:  NITROSTAT  Place 1 tablet (0.4 mg total) under the tongue every 5 (five) minutes as needed for chest pain (up to 3 doses).     oxybutynin 10 MG 24 hr tablet  Commonly known as:  DITROPAN-XL  Take 10 mg by mouth daily.     pantoprazole 40 MG tablet  Commonly known as:  PROTONIX  Take 1 tablet (40 mg total) by mouth daily.     polyethylene glycol powder powder  Commonly known as:  GLYCOLAX/MIRALAX  Take 17 g by mouth daily.     polyvinyl alcohol 1.4 % ophthalmic solution  Commonly  known as:  LIQUIFILM TEARS  Place 2 drops into both eyes as needed (dry eyes.).     rosuvastatin 10 MG tablet  Commonly known as:  CRESTOR  Take 10 mg by mouth at bedtime.     sodium chloride 0.65 % Soln nasal spray  Commonly known as:  OCEAN  Place 1 spray into both nostrils as needed for congestion.     traMADol 50  MG tablet  Commonly known as:  ULTRAM  Take 1 tablet (50 mg total) by mouth every 6 (six) hours as needed for pain.     TRUETEST TEST test strip  Generic drug:  glucose blood  2 (two) times daily.       Follow-up Information    Follow up with Dorris Carnes, MD.   Specialty:  Cardiology   Why:  the office will call with date and time   Contact information:   [address] [address] [phone]        Discharge Instructions: Heart Healthy diabetic diet  Your stress test was normal but if continued problems please call  We added protonix 1 daily try for 1 week then decrease to if needed.  Your pain may have been from your stomach.  Continue eliquis.     Signed: Isaiah Serge Nurse Practitioner-Certified [facility]: [facility] 12/18/2014, 5:58 PM  Time spent on discharge : > 30 minutes.

## 2014-12-18 NOTE — Progress Notes (Signed)
Patient ambulated 600 ft in hallway and tolerated well. No complaints.

## 2014-12-18 NOTE — Progress Notes (Signed)
lexiscan myoview completed without complications.  Nuc results to follow. 

## 2014-12-18 NOTE — Progress Notes (Signed)
Subjective: No further chest pain no complaints  Objective: Vital signs in last 24 hours: Temp:  [97.7 F (36.5 C)-98.1 F (36.7 C)] 97.8 F (36.6 C) (05/28 0507) Pulse Rate:  [47-88] 73 (05/28 0507) Resp:  [12-22] 18 (05/27 1838) BP: (109-172)/(64-128) 139/67 mmHg (05/28 0916) SpO2:  [89 %-99 %] 97 % (05/28 0507) Weight:  [175 lb 3.2 oz (79.47 kg)-177 lb 0.5 oz (80.3 kg)] 175 lb 3.2 oz (79.47 kg) (05/28 0507) Weight change:  Last BM Date: 12/17/14 Intake/Output from previous day:   Intake/Output this shift:    PE: General:Pleasant affect, NAD Skin:Warm and dry, brisk capillary refill HEENT:normocephalic, sclera clear, mucus membranes moist Heart:irreg irreg without murmur, gallup, rub or click Lungs:clear without rales, rhonchi, or wheezes BSW:HQPR, non tender, + BS, do not palpate liver spleen or masses Ext:no lower ext edema, 2+ pedal pulses, 2+ radial pulses Neuro:alert and oriented X 3, MAE, follows commands, + facial symmetry tele a fib, rate controlled A fib RVR  Lab Results:  Recent Labs  12/17/14 1320  WBC 5.2  HGB 14.0  HCT 41.3  PLT 175   BMET  Recent Labs  12/17/14 1320 12/18/14 0733  NA 139 139  K 4.5 4.2  CL 101 101  CO2 27 29  GLUCOSE 115* 122*  BUN 13 13  CREATININE 1.05* 1.15*  CALCIUM 9.9 8.9    Recent Labs  12/18/14 0005 12/18/14 0733  TROPONINI <0.03 <0.03    Lab Results  Component Value Date   CHOL 117 11/30/2014   HDL 56.10 11/30/2014   LDLCALC 45 11/30/2014   TRIG 80.0 11/30/2014   CHOLHDL 2 11/30/2014   Lab Results  Component Value Date   HGBA1C 6.0 06/07/2014     Lab Results  Component Value Date   TSH 0.60 06/07/2014   Studies/Results: Dg Chest 2 View  12/17/2014   CLINICAL DATA:  All over chest pain, left arm pain, no SOB x 1 day.HX of HTN-on meds, DM- on meds, non smoker, Hx of having stents put in many years ago  EXAM: CHEST  2 VIEW  COMPARISON:  05/05/2014  FINDINGS: Cardiac silhouette is normal in  size and configuration. Aorta is mildly uncoiled. No mediastinal or hilar masses or evidence of adenopathy.  Lungs are mildly hyperexpanded. There is mild prominence of the interstitial markings in the lung bases. No lung consolidation or edema. No pleural effusion or pneumothorax.  Bony thorax is demineralized but grossly intact.  IMPRESSION: No acute cardiopulmonary disease.   Electronically Signed   By: Lajean Manes M.D.   On: 12/17/2014 13:39    Medications: I have reviewed the patient's current medications. Scheduled Meds: . amLODipine  2.5 mg Oral QHS  . apixaban  2.5 mg Oral BID  . aspirin EC  81 mg Oral Daily  . gabapentin  600 mg Oral TID  . glipiZIDE  5 mg Oral Q breakfast  . pantoprazole  40 mg Oral Daily  . rosuvastatin  10 mg Oral Daily   Continuous Infusions:  PRN Meds:.acetaminophen, nitroGLYCERIN, nitroGLYCERIN, ondansetron (ZOFRAN) IV, technetium sestamibi   Assessment/Plan:  81 y/o female with known CAD with prior LAD stenting in 2005 and repeat PCI in 2006 for ISR, atrial fibrillation on chronic Eliquis, HTN, HLD and DM, presenting with chest pain.   1. Chest Pain/CAD: h/o PCI + DES to LAD in 2005 with repeat angioplasty in 2006 for ISR. Has multiple other CR including HTN, HLD and DM. Now with intermittent left arm and  chest discomfort. Relieved with SL NTG.  troponins are negative. EKG shows atrial fibrillation with CVR and chronic RBBB. . Doubt PE given reported full compliance with Eliquis. Will plan for NST this am to r/o ischemia if normal enzymes. Continue home meds: ASA and Crestor. Consider low dose BB.   2. Atrial Fibrillation: CVR. Asymptomatic. Keep on telemetry. Continue Eliquis.   3. HTN: continue home meds  4. HLD: recent lipid panel 11/30/14 showed LDL well controlled at 45 mg/dL. Continue Crestor.  5. DM: continue Glipizide.  CBG 156-102   Time spent with pt. :15 minutes. [facility] R  Nurse Practitioner Certified Pager [phone] or after 5pm and  on weekends call [phone] 12/18/2014, 10:18 AM   The patient was seen, examined and discussed with Becky Kicks, NP and I agree with the above.   81 year old with known CAD, admitted with chest pain, currently asymptomatic, negative Lexiscan nuclear stress test.  Her BP is controlled most of the times. We will continue the same home medications, continue anticoagulation for chronic persistent a-fib that is rate controlled. We will discharge home.  [name] 12/18/2014

## 2014-12-29 LAB — NM MYOCAR MULTI W/SPECT W/WALL MOTION / EF
CHL CUP NUCLEAR SSS: 15
LHR: 0.84
LV dias vol: 43 mL
LV sys vol: 7 mL
NUC STRESS TID: 0.97
Nuc Stress EF: 84 %
SDS: 6
SRS: 9

## 2015-01-03 ENCOUNTER — Telehealth: Payer: Self-pay

## 2015-01-03 NOTE — Telephone Encounter (Signed)
LMTCO.

## 2015-01-30 NOTE — Progress Notes (Signed)
Cardiology Office Note   Date:  01/31/2015   ID:  SEAIRRA OTANI, DOB Jun 05, 1924, MRN [id number]  PCP:  Chesley Noon, MD  Cardiologist:  Dr. Dorris Carnes     Chief Complaint  Patient presents with  . Hospitalization Follow-up    admitted for chest pain  . Coronary Artery Disease     History of Present Illness: [name] is a 81 y.o. female with a hx of CAD, s/p prior Taxus DES to mLAD in 2005 and subsequent cutting balloon angioplasty due to ISR in 2006. Last LHC in 2/12 demonstrated LAD 40%, patent stent, prox and mid RCA 30% and EF 60%. Echo in 5/13: mild LVH, EF 55%, mild BAE, mild AI. She also has a h/o AFib, DM2, HTN, HLP, anemia, GERD, IBS, DJD and multinodular goiter.  She is on Eliquis for anticoagulation.    Admitted 5/27-5/28 with 1 day history of intermittent left arm and chest pain. She had relief with SL NTG. Episodes last ~5 minutes.  EKG showed atrial fibrillation with a CVR of 75 bpm and chronic RBBB.  Her Troponins were negative. Lexiscan myoview was negative for ischemia.   She returns for FU.  Since DC, she denies any further chest symptoms. She denies significant dyspnea. She is mainly limited by knee arthritis. She is NYHA 2b. She denies orthopnea, PND. She does note dependent edema that is worse at the end of the day. She denies significant worsening. She denies syncope. She denies bleeding problems. She is tolerating Eliquis. She does feel palpitations in her left arm mainly at night. This has been going on for several months without significant change.   Studies/Reports Reviewed Today:  Myoview 12/18/14 Normal stress nuclear study.  LV Ejection Fraction: 71%.  Carotid US 05/25/14 Bilat ICA 1-39% >> FU 2 years  Echo 11/21/11 Mild LVH, EF 55%, no RWMA Mild AI Mild LAE Trivial MR Mild RAE PASP 29-33 mmHg   Past Medical History  Diagnosis Date  . Allergic rhinitis   . Anemia     NOS chronic disease. Hgb 11.6gm% 04/14/2009  . CAD  (coronary artery disease)     a. s/p DES mLAD 2005. b. Cutting balloon angioplasty for ISR 2006. c. LHC 08/2010 nonobstructive. d. Myoview 06/2012: small anteroapical infarct/mod inferolat wall infarct but no ischemia, EF 75%.  . Diverticulitis of colon   . GERD (gastroesophageal reflux disease)   . IBS (irritable bowel syndrome)   . Spinal stenosis     djd lumbar  . Multinodular goiter   . Orthostatic hypotension   . Obesity     BMI-37  . Acute renal failure     due to dehydration-Sept 2009; Normal creat 1.2 on fu 05/13/2009  . Hx of colonoscopy   . Cancer     on nose and had it removed  . DEMENTIA     slight  . Osteoarthritis     lower back  . Pneumonia   . PAF (paroxysmal atrial fibrillation)     noted on event monitor. No correlation with episodes and symptoms  . Chronic anticoagulation   . RBBB   . 1St degree AV block   . Diabetes mellitus type II   . DM neuropathy, type II diabetes mellitus   . Hyperlipidemia   . HTN (hypertension)     Past Surgical History  Procedure Laterality Date  . Cholecystectomy    . Abdominal hysterectomy    . Nasal sinus surgery      x2  .  Bilateral arthroscopic knee surgery    . Stent surgery    . Coronary angioplasty      stent  . Carpel tunnel wrist rt       Current Outpatient Prescriptions  Medication Sig Dispense Refill  . acetaminophen (TYLENOL) 650 MG CR tablet Take 650 mg by mouth every 8 (eight) hours as needed for pain.     Marland Kitchen alendronate (FOSAMAX) 70 MG tablet Take 1 tablet (70 mg total) by mouth every 7 (seven) days. Take on Tuesdays with a full glass of water on an empty stomach. 4 tablet 11  . amLODipine (NORVASC) 2.5 MG tablet Take 2.5 mg by mouth 2 (two) times daily.     Marland Kitchen apixaban (ELIQUIS) 5 MG TABS tablet Take 1 tablet (5 mg total) by mouth 2 (two) times daily.    Marland Kitchen aspirin 81 MG EC tablet Take 81 mg by mouth daily.      . Calcium Carbonate-Vit D-Min (CALCIUM 1200 PO) Take 1 tablet by mouth 2 (two) times daily.       . diclofenac sodium (VOLTAREN) 1 % GEL Place 1 application onto the skin 3 (three) times daily as needed (hand pain).     Marland Kitchen gabapentin (NEURONTIN) 600 MG tablet Take 600 mg by mouth 3 (three) times daily.     Marland Kitchen glipiZIDE (GLUCOTROL XL) 5 MG 24 hr tablet Take 5 mg by mouth daily with breakfast.    . Multiple Vitamins-Minerals (CENTRUM SILVER PO) Take 1 tablet by mouth daily.     . Multiple Vitamins-Minerals (PRESERVISION AREDS 2 PO) Take 2 tablets by mouth daily.    . nitroGLYCERIN (NITROSTAT) 0.4 MG SL tablet Place 1 tablet (0.4 mg total) under the tongue every 5 (five) minutes as needed for chest pain (up to 3 doses). 25 tablet 4  . oxybutynin (DITROPAN-XL) 10 MG 24 hr tablet Take 10 mg by mouth daily.  3  . pantoprazole (PROTONIX) 40 MG tablet Take 1 tablet (40 mg total) by mouth daily. 30 tablet 0  . polyethylene glycol powder (GLYCOLAX/MIRALAX) powder Take 17 g by mouth daily.    . polyvinyl alcohol (LIQUIFILM TEARS) 1.4 % ophthalmic solution Place 2 drops into both eyes as needed (dry eyes.).    Marland Kitchen rosuvastatin (CRESTOR) 10 MG tablet Take 10 mg by mouth at bedtime.     . sodium chloride (OCEAN) 0.65 % SOLN nasal spray Place 1 spray into both nostrils as needed for congestion.    . traMADol (ULTRAM) 50 MG tablet Take 1 tablet (50 mg total) by mouth every 6 (six) hours as needed for pain. 20 tablet 0  . TRUETEST TEST test strip 2 (two) times daily.     No current facility-administered medications for this visit.    Allergies:   Amoxicillin    Social History:  The patient  reports that she has never smoked. She has never used smokeless tobacco. She reports that she does not drink alcohol or use illicit drugs.   Family History:  The patient's family history includes Breast cancer in her sister; Heart attack in her daughter, father, and son; Heart disease in her brother. There is no history of Colon cancer or Stroke.    ROS:   Please see the history of present illness.   Review of  Systems  Constitution: Positive for decreased appetite.  Cardiovascular: Positive for irregular heartbeat and leg swelling.  Hematologic/Lymphatic: Bruises/bleeds easily.  Musculoskeletal: Positive for joint pain.  Psychiatric/Behavioral: Positive for depression.  All other systems reviewed  and are negative.     PHYSICAL EXAM: VS:  BP 124/58 mmHg  Pulse 77  Ht 5\' 2"  (1.575 m)  Wt 177 lb (80.287 kg)  BMI 32.37 kg/m2    Wt Readings from Last 3 Encounters:  01/31/15 177 lb (80.287 kg)  12/18/14 175 lb 3.2 oz (79.47 kg)  11/05/14 183 lb (83.008 kg)     GEN: Well nourished, well developed, in no acute distress HEENT: normal Neck: no JVD  no masses Cardiac:  Normal S1/S2, irregularly irregular rhythm; no murmur, no rubs or gallops, no edema   Respiratory:  clear to auscultation bilaterally, no wheezing, rhonchi or rales. GI: soft, nontender, nondistended, + BS MS: no deformity or atrophy Skin: warm and dry  Neuro:  CNs II-XII intact, Strength and sensation are intact Psych: Normal affect   EKG:  EKG is ordered today.  It demonstrates:   Atrial fibrillation, RBBB, HR 77, no change from prior tracing   Recent Labs: 05/05/2014: ALT 19 06/07/2014: TSH 0.60 12/17/2014: B Natriuretic Peptide 234.4*; Hemoglobin 14.0; Platelets 175 12/18/2014: BUN 13; Creatinine, Ser 1.15*; Potassium 4.2; Sodium 139    Lipid Panel    Component Value Date/Time   CHOL 117 11/30/2014 1041   TRIG 80.0 11/30/2014 1041   HDL 56.10 11/30/2014 1041   CHOLHDL 2 11/30/2014 1041   VLDL 16.0 11/30/2014 1041   LDLCALC 45 11/30/2014 1041      ASSESSMENT AND PLAN:  Palpitations - Plan: Holter monitor - 48 hour  Atrial fibrillation, unspecified:  Rate is controlled. She is tolerating Eliquis.  As noted, I will obtain a Holter monitor to ensure that her heart rate remains controlled throughout the day.    Coronary artery disease involving native coronary artery of native heart without angina pectoris:   No angina. Recent Myoview low risk. Continue statin, aspirin, amlodipine.   Essential hypertension:  Controlled.   Hyperlipidemia:  Continue statin.     Medication Changes: Current medicines are reviewed at length with the patient today.  Concerns regarding medicines are as outlined above.  The following changes have been made:   Discontinued Medications   No medications on file   Modified Medications   Modified Medication Previous Medication   APIXABAN (ELIQUIS) 5 MG TABS TABLET apixaban (ELIQUIS) 5 MG TABS tablet      Take 1 tablet (5 mg total) by mouth 2 (two) times daily.    Take 0.5 tablets (2.5 mg total) by mouth 2 (two) times daily.   New Prescriptions   No medications on file     Labs/ tests ordered today include:   Orders Placed This Encounter  Procedures  . Holter monitor - 48 hour     Disposition:   FU with Dr. Dorris Carnes 07/2015.     Signed, [name], MHS 01/31/2015 4:18 PM    [facility] [place], [place], [place]  [address] Phone: [phone]; Fax: [phone]

## 2015-01-31 ENCOUNTER — Ambulatory Visit (INDEPENDENT_AMBULATORY_CARE_PROVIDER_SITE_OTHER): Payer: Medicare Other | Admitting: Physician Assistant

## 2015-01-31 ENCOUNTER — Encounter: Payer: Self-pay | Admitting: Physician Assistant

## 2015-01-31 VITALS — BP 124/58 | HR 77 | Ht 62.0 in | Wt 177.0 lb

## 2015-01-31 DIAGNOSIS — R002 Palpitations: Secondary | ICD-10-CM

## 2015-01-31 DIAGNOSIS — I2510 Atherosclerotic heart disease of native coronary artery without angina pectoris: Secondary | ICD-10-CM

## 2015-01-31 DIAGNOSIS — I10 Essential (primary) hypertension: Secondary | ICD-10-CM | POA: Diagnosis not present

## 2015-01-31 DIAGNOSIS — I4891 Unspecified atrial fibrillation: Secondary | ICD-10-CM | POA: Diagnosis not present

## 2015-01-31 DIAGNOSIS — E785 Hyperlipidemia, unspecified: Secondary | ICD-10-CM

## 2015-01-31 MED ORDER — APIXABAN 5 MG PO TABS: 5.0000 mg | ORAL_TABLET | Freq: Two times a day (BID) | ORAL | Status: AC

## 2015-01-31 NOTE — Patient Instructions (Signed)
Medication Instructions:  Your physician recommends that you continue on your current medications as directed. Please refer to the Current Medication list given to you today.   Labwork: NONE  Testing/Procedures: Your physician has recommended that you wear a 48 HOUR holter monitor. Holter monitors are medical devices that record the heart's electrical activity. Doctors most often use these monitors to diagnose arrhythmias. Arrhythmias are problems with the speed or rhythm of the heartbeat. The monitor is a small, portable device. You can wear one while you do your normal daily activities. This is usually used to diagnose what is causing palpitations/syncope (passing out).   Follow-Up: Your physician wants you to follow-up in: 07/2015 WITH DR. Harrington Challenger.  You will receive a reminder letter in the mail two months in advance. If you don't receive a letter, please call our office to schedule the follow-up appointment.   Any Other Special Instructions Will Be Listed Below (If Applicable).

## 2015-02-01 ENCOUNTER — Ambulatory Visit (INDEPENDENT_AMBULATORY_CARE_PROVIDER_SITE_OTHER): Payer: Medicare Other

## 2015-02-01 DIAGNOSIS — R002 Palpitations: Secondary | ICD-10-CM

## 2015-02-01 DIAGNOSIS — I4891 Unspecified atrial fibrillation: Secondary | ICD-10-CM | POA: Diagnosis not present

## 2015-02-04 ENCOUNTER — Telehealth: Payer: Self-pay | Admitting: *Deleted

## 2015-02-04 NOTE — Telephone Encounter (Signed)
Please give Holter results to [facility] for me to look at. Really want to see her avg HR is for 24 hour period. Richardson Dopp, PA-C   02/04/2015 1:19 PM

## 2015-02-04 NOTE — Telephone Encounter (Signed)
Call from [place] at [employer] re: 48 hr holter  Calling with Juluis Rainier that the entire recording was afib/flutter rates 24-162, She is emailing the results as they normally do.

## 2015-02-09 ENCOUNTER — Telehealth: Payer: Self-pay | Admitting: Internal Medicine

## 2015-02-09 NOTE — Telephone Encounter (Signed)
Will put in your mailbox.

## 2015-02-09 NOTE — Telephone Encounter (Signed)
New message     Pt returning call regarding results from monitor. Please call to discuss.

## 2015-02-09 NOTE — Telephone Encounter (Signed)
Patient calling back about monitor results.  Told patient results and there would not be any changes.  She asked, is that what could be wrong with her hand.  When she saw Richardson Dopp APP PA-C she told him that her hand has been hurting and uncomfortable from her wrist down to her finger tips.  She ask if the palpitations could be causing this.  I will route this to Dr. Harrington Challenger about patients question and will call her back.

## 2015-02-10 ENCOUNTER — Telehealth: Payer: Self-pay

## 2015-02-10 NOTE — Telephone Encounter (Signed)
I have placed monitor results in folder for [employer]. PA review.

## 2015-02-10 NOTE — Telephone Encounter (Signed)
I do not think palpitations are related to hand discomfort  I would recomm she discuss with primary MD

## 2015-02-10 NOTE — Telephone Encounter (Signed)
Told patient that Dr. Harrington Challenger doesn't feel the hand pain is not caused by the palpitations and told her that Dr. Harrington Challenger wants her to follow up with her PCP Dr. Melford Aase for her hand pain.

## 2015-02-10 NOTE — Telephone Encounter (Signed)
Follow Up  Pt following up on heart monitor results- pt wanted to know if Dr. Harrington Challenger had commented on the hand pain in relation to the heart monitor. Please call back and discuss.

## 2015-02-16 ENCOUNTER — Telehealth: Payer: Self-pay | Admitting: Internal Medicine

## 2015-02-16 NOTE — Telephone Encounter (Signed)
Atrial fib Good rate control Average HR 78 bpm Longest pasue 2.55 sectonds.     ----- Message -----     From: Jennefer Bravo     Sent: 02/08/2015  2:06 PM      To: Fay Records, MD    Subject: Import                            The document below was attached by Jennefer Bravo [[id number]] on 02/08/2015 at 2:06 PM to the following: Holter monitor - 48 hour on 02/01/2015.    Spoke with pt and informed her of monitor results. Pt verbalized understanding.

## 2015-05-18 ENCOUNTER — Telehealth: Payer: Self-pay | Admitting: *Deleted

## 2015-05-18 ENCOUNTER — Ambulatory Visit (INDEPENDENT_AMBULATORY_CARE_PROVIDER_SITE_OTHER): Payer: Medicare Other | Admitting: *Deleted

## 2015-05-18 DIAGNOSIS — I4891 Unspecified atrial fibrillation: Secondary | ICD-10-CM

## 2015-05-18 LAB — CBC
HEMATOCRIT: 39.1 % (ref 36.0–46.0)
Hemoglobin: 13.3 g/dL (ref 12.0–15.0)
MCH: 31.6 pg (ref 26.0–34.0)
MCHC: 34 g/dL (ref 30.0–36.0)
MCV: 92.9 fL (ref 78.0–100.0)
MPV: 10.2 fL (ref 8.6–12.4)
Platelets: 233 10*3/uL (ref 150–400)
RBC: 4.21 MIL/uL (ref 3.87–5.11)
RDW: 13.6 % (ref 11.5–15.5)
WBC: 7 10*3/uL (ref 4.0–10.5)

## 2015-05-18 LAB — BASIC METABOLIC PANEL
BUN: 15 mg/dL (ref 7–25)
CHLORIDE: 101 mmol/L (ref 98–110)
CO2: 29 mmol/L (ref 20–31)
CREATININE: 1.19 mg/dL — AB (ref 0.60–0.88)
Calcium: 9.5 mg/dL (ref 8.6–10.4)
Glucose, Bld: 82 mg/dL (ref 65–99)
Potassium: 4.5 mmol/L (ref 3.5–5.3)
Sodium: 141 mmol/L (ref 135–146)

## 2015-05-18 NOTE — Telephone Encounter (Signed)
Pt instructed to stay on ASA 81mg  daily

## 2015-05-18 NOTE — Telephone Encounter (Signed)
-----   Message from Fay Records, MD sent at 05/18/2015  1:26 PM EDT ----- Yes  She should stay on asa ----- Message -----    From: Margretta Sidle, RN    Sent: 05/18/2015  11:58 AM      To: Fay Records, MD  Dr Llana Aliment Mrs Elizarraraz in the coumadin clinic for 6 months follow up Eliquis and noted she is still on ASA 81 mg daily. Just wanted to be sure she is to remain on this medication Please advise  Thank You  Elbert Ewings RN

## 2015-05-18 NOTE — Progress Notes (Signed)
Pt was started on Eliquis 5mg  bid  for Atrial Fib  on 04/20/2013 .    Reviewed patients medication list.  Pt is not currently on any combined P-gp and strong CYP3A4 inhibitors/inducers (ketoconazole, traconazole, ritonavir, carbamazepine, phenytoin, rifampin, St. John's wort).  Reviewed labs.  SCr 1.19 , Weight 80 kg .  Dose is; appropriate based on age weight and SrCr.   Hgb 13.3 and HCT 39.1  A full discussion of the nature of anticoagulants has been carried out.  A benefit/risk analysis has been presented to the patient, so that they understand the justification for choosing anticoagulation with Eliquis at this time.  The need for compliance is stressed.  Pt is aware to take the medication twice daily.  Side effects of potential bleeding are discussed, including unusual colored urine or stools, coughing up blood or coffee ground emesis, nose bleeds or serious fall or head trauma.  Discussed signs and symptoms of stroke. The patient should avoid any OTC items containing aspirin or ibuprofen.  Avoid alcohol consumption.   Call if any signs of abnormal bleeding.  Discussed financial obligations and pt states she is able to obtain medication.  Next lab test test in 6 months.  Pt was changed to Eliquis 2.5mg  bid in April 27th 2016 but then changed back to Eliquis 5mg  bid July 10th .2016  Pt states has not missed any doses and has had no sign or symptom of bleeding or sign or symptom of stroke.Will obtain CBC and BMET today and will call with results 05/19/2015 Spoke with pt and instructed that she is on the correct dose of Eliquis 5mg  take every 12 hours and that Dr Harrington Challenger states she is to continue to take ASA 81 mg daily and she states understanding of these instructions Appt made for 6 months follow up

## 2015-05-19 ENCOUNTER — Telehealth: Payer: Self-pay | Admitting: *Deleted

## 2015-05-19 NOTE — Telephone Encounter (Signed)
Pt states she has an appt 06/08/2015 and is on Eliquis, does she need to stop.  I informed pt she is to take her medications as prescribed until seen in office.  Pt states understanding.

## 2015-06-08 ENCOUNTER — Ambulatory Visit: Payer: Medicare Other | Admitting: Podiatry

## 2015-08-05 ENCOUNTER — Encounter: Payer: Self-pay | Admitting: Internal Medicine

## 2015-08-05 ENCOUNTER — Ambulatory Visit (INDEPENDENT_AMBULATORY_CARE_PROVIDER_SITE_OTHER): Payer: Medicare Other | Admitting: Internal Medicine

## 2015-08-05 VITALS — BP 124/60 | HR 84 | Ht 62.0 in | Wt 175.0 lb

## 2015-08-05 DIAGNOSIS — I10 Essential (primary) hypertension: Secondary | ICD-10-CM

## 2015-08-05 LAB — BASIC METABOLIC PANEL
BUN: 18 mg/dL (ref 7–25)
CHLORIDE: 99 mmol/L (ref 98–110)
CO2: 30 mmol/L (ref 20–31)
Calcium: 10.3 mg/dL (ref 8.6–10.4)
Creat: 0.99 mg/dL — ABNORMAL HIGH (ref 0.60–0.88)
GLUCOSE: 90 mg/dL (ref 65–99)
POTASSIUM: 4.5 mmol/L (ref 3.5–5.3)
SODIUM: 139 mmol/L (ref 135–146)

## 2015-08-05 LAB — CBC
HEMATOCRIT: 39.8 % (ref 36.0–46.0)
HEMOGLOBIN: 13.5 g/dL (ref 12.0–15.0)
MCH: 31.4 pg (ref 26.0–34.0)
MCHC: 33.9 g/dL (ref 30.0–36.0)
MCV: 92.6 fL (ref 78.0–100.0)
MPV: 10.4 fL (ref 8.6–12.4)
Platelets: 231 10*3/uL (ref 150–400)
RBC: 4.3 MIL/uL (ref 3.87–5.11)
RDW: 14.3 % (ref 11.5–15.5)
WBC: 6.2 10*3/uL (ref 4.0–10.5)

## 2015-08-05 NOTE — Patient Instructions (Signed)
Your physician recommends that you continue on your current medications as directed. Please refer to the Current Medication list given to you today.  Your physician recommends that you return for lab work today (BMET, CBC)  Your physician wants you to follow-up in: 9 months with Dr. Harrington Challenger.  You will receive a reminder letter in the mail two months in advance. If you don't receive a letter, please call our office to schedule the follow-up appointment.

## 2015-08-05 NOTE — Progress Notes (Signed)
Cardiology Office Note   Date:  08/05/2015   ID:  [name], DOB 05-10-1924, MRN [id number]  PCP:  Chesley Noon, MD  Cardiologist:   Dorris Carnes, MD   F/U of CAD     History of Present Illness: [name] is a 82 y.o. female with a history of CAD, s/p prior Taxus DES to mLAD in 2005 and subsequent cutting balloon angioplasty due to ISR in 2006. Last LHC in 2/12 demonstrated LAD 40%, patent stent, prox and mid RCA 30% and EF 60%. Echo in 5/13: mild LVH, EF 55%, mild BAE, mild AI. She also has a h/o AFib, DM2, HTN, HLP, anemia, GERD, IBS, DJD and multinodular goiter. She is on Eliquis for anticoagulation.  Since I saw her she was admitted to [facility] for CP  Lexiscan myoview was done which showed no ischemia    Breathing is pretty good  No signif CP  Will get some heaviness if pushes self.     Current Outpatient Prescriptions  Medication Sig Dispense Refill  . amLODipine (NORVASC) 2.5 MG tablet Take 2.5 mg by mouth 2 (two) times daily.     Marland Kitchen apixaban (ELIQUIS) 5 MG TABS tablet Take 1 tablet (5 mg total) by mouth 2 (two) times daily.    Marland Kitchen aspirin 81 MG EC tablet Take 81 mg by mouth daily.      . baclofen (LIORESAL) 10 MG tablet Take 10 mg by mouth as directed.    . Calcium Carbonate-Vit D-Min (CALCIUM 1200 PO) Take 1 tablet by mouth 2 (two) times daily.     Marland Kitchen escitalopram (LEXAPRO) 10 MG tablet Take 10 mg by mouth daily.    Marland Kitchen gabapentin (NEURONTIN) 600 MG tablet Take 600 mg by mouth 3 (three) times daily.     Marland Kitchen glipiZIDE (GLUCOTROL XL) 5 MG 24 hr tablet Take 5 mg by mouth daily with breakfast.    . Multiple Vitamins-Minerals (CENTRUM SILVER PO) Take 1 tablet by mouth daily.     . Multiple Vitamins-Minerals (PRESERVISION AREDS 2 PO) Take 2 tablets by mouth daily.    Marland Kitchen oxybutynin (DITROPAN) 5 MG tablet Take 5 mg by mouth daily.    . polyethylene glycol powder (GLYCOLAX/MIRALAX) powder Take 17 g by mouth daily.    . rosuvastatin (CRESTOR) 5 MG tablet Take 5 mg  by mouth at bedtime.    . TRUETEST TEST test strip 2 (two) times daily.     No current facility-administered medications for this visit.    Allergies:   Amoxicillin   Past Medical History  Diagnosis Date  . Allergic rhinitis   . Anemia     NOS chronic disease. Hgb 11.6gm% 04/14/2009  . CAD (coronary artery disease)     a. s/p DES mLAD 2005. b. Cutting balloon angioplasty for ISR 2006. c. LHC 08/2010 nonobstructive. d. Myoview 06/2012: small anteroapical infarct/mod inferolat wall infarct but no ischemia, EF 75%.  . Diverticulitis of colon   . GERD (gastroesophageal reflux disease)   . IBS (irritable bowel syndrome)   . Spinal stenosis     djd lumbar  . Multinodular goiter   . Orthostatic hypotension   . Obesity     BMI-37  . Acute renal failure ([place])     due to dehydration-Sept 2009; Normal creat 1.2 on fu 05/13/2009  . Hx of colonoscopy   . Cancer ([place])     on nose and had it removed  . DEMENTIA     slight  . Osteoarthritis  lower back  . Pneumonia   . PAF (paroxysmal atrial fibrillation) (HCC)     noted on event monitor. No correlation with episodes and symptoms  . Chronic anticoagulation   . RBBB   . 1St degree AV block   . Diabetes mellitus type II   . DM neuropathy, type II diabetes mellitus ([place])   . Hyperlipidemia   . HTN (hypertension)     Past Surgical History  Procedure Laterality Date  . Cholecystectomy    . Abdominal hysterectomy    . Nasal sinus surgery      x2  . Bilateral arthroscopic knee surgery    . Stent surgery    . Coronary angioplasty      stent  . Carpel tunnel wrist rt       Social History:  The patient  reports that she has never smoked. She has never used smokeless tobacco. She reports that she does not drink alcohol or use illicit drugs.   Family History:  The patient's family history includes Breast cancer in her sister; Heart attack in her daughter, father, and son; Heart disease in her brother. There is no history of Colon  cancer or Stroke.    ROS:  Please see the history of present illness. All other systems are reviewed and  Negative to the above problem except as noted.    PHYSICAL EXAM: VS:  BP 124/60 mmHg  Pulse 84  Ht 5\' 2"  (1.575 m)  Wt 79.379 kg (175 lb)  BMI 32.00 kg/m2  SpO2 98%  GEN: Well nourished, well developed, in no acute distress HEENT: normal Neck: no JVD, carotid bruits, or masses Cardiac: RRR; no murmurs, rubs, or gallops,no edema  Respiratory:  clear to auscultation bilaterally, normal work of breathing GI: soft, nontender, nondistended, + BS  No hepatomegaly  MS: no deformity Moving all extremities   Skin: warm and dry, no rash Neuro:  Strength and sensation are intact Psych: euthymic mood, full affect   EKG:  EKG is not ordered today.   Lipid Panel    Component Value Date/Time   CHOL 117 11/30/2014 1041   TRIG 80.0 11/30/2014 1041   HDL 56.10 11/30/2014 1041   CHOLHDL 2 11/30/2014 1041   VLDL 16.0 11/30/2014 1041   LDLCALC 45 11/30/2014 1041      Wt Readings from Last 3 Encounters:  08/05/15 79.379 kg (175 lb)  01/31/15 80.287 kg (177 lb)  12/18/14 79.47 kg (175 lb 3.2 oz)      ASSESSMENT AND PLAN:  1  CAD  No symptoms  Follow    2  HL  Will need to get fasting labs     F/U in the fall       Signed, Dorris Carnes, MD  08/05/2015 11:31 AM    [facility] [place], [place], [place]  [address] Phone: [phone]; Fax: [phone]

## 2015-09-02 DIAGNOSIS — N907 Vulvar cyst: Secondary | ICD-10-CM | POA: Insufficient documentation

## 2015-09-07 DIAGNOSIS — M5136 Other intervertebral disc degeneration, lumbar region: Secondary | ICD-10-CM | POA: Insufficient documentation

## 2015-09-07 DIAGNOSIS — M1611 Unilateral primary osteoarthritis, right hip: Secondary | ICD-10-CM | POA: Insufficient documentation

## 2015-09-08 ENCOUNTER — Telehealth: Payer: Self-pay | Admitting: *Deleted

## 2015-09-08 NOTE — Telephone Encounter (Signed)
Pt states she has an appt with Dr. Amalia Hailey on 09/09/2015, is it okay for her to be on Eliquis?  I told pt she would be seeing Dr. Prudence Davidson, and to continue Eliquis, that if a procedure needed to be performed and if necessary to be off the medication we would contact the prescriber.

## 2015-09-09 ENCOUNTER — Ambulatory Visit (INDEPENDENT_AMBULATORY_CARE_PROVIDER_SITE_OTHER): Payer: Medicare Other | Admitting: Podiatry

## 2015-09-09 ENCOUNTER — Encounter: Payer: Self-pay | Admitting: Podiatry

## 2015-09-09 VITALS — BP 178/81 | HR 72 | Resp 14

## 2015-09-09 DIAGNOSIS — M79676 Pain in unspecified toe(s): Secondary | ICD-10-CM | POA: Diagnosis not present

## 2015-09-09 DIAGNOSIS — B351 Tinea unguium: Secondary | ICD-10-CM

## 2015-09-09 NOTE — Progress Notes (Signed)
   Subjective:    Patient ID: [name], female    DOB: 05-07-1924, 82 y.o.   MRN: [id number]  HPI this diabetic patient presents to the office stating that her right great toenail is causing pain and discomfort. She states she has had pain for over 1 year. She states that her nail has developed has become thick disfigured and discolored similar to the big toe on the foot. She was referred to this office by Dr. badger who thought  that removal of the nail would be necessary.  She denies any drainage or redness from these 2 nail sites. She presents the office for an evaluation and treatment.  The patient presents here today with left great toe pain that is thick and loose since 1 year.  Review of Systems  Cardiovascular: Positive for leg swelling.       Objective:   Physical Exam GENERAL APPEARANCE: Alert, conversant. Appropriately groomed. No acute distress.  VASCULAR: Pedal pulses palpable at  [facility] and PT bilateral.  Capillary refill time is immediate to all digits,  Normal temperature gradient.  Digital hair growth is present bilateral  NEUROLOGIC: sensation is normal to 5.07 monofilament at 5/5 sites bilateral.  Light touch is intact bilateral, Muscle strength normal.  MUSCULOSKELETAL: acceptable muscle strength, tone and stability bilateral.  Intrinsic muscluature intact bilateral.  Rectus appearance of foot and digits noted bilateral.   DERMATOLOGIC: skin color, texture, and turgor are within normal limits.  No preulcerative lesions or ulcers  are seen, no interdigital maceration noted.  No open lesions present.  . No drainage noted.  NAILS  Thick disfigured discolored nails both hallux nails.  No redness or drainage from the hallux toenail.         Assessment & Plan:  Onychomycosis hallux B/L  IE  Debridement of nails.  Discussed conservative vs. Surgical treatment.  Will be seen in 3 months and reevaluate at that time.   Gardiner Barefoot DPM

## 2015-10-24 ENCOUNTER — Encounter (HOSPITAL_COMMUNITY): Payer: Self-pay

## 2015-10-24 ENCOUNTER — Observation Stay (HOSPITAL_COMMUNITY)
Admission: EM | Admit: 2015-10-24 | Discharge: 2015-10-25 | Disposition: A | Discharge status: Home / Self Care | Payer: Medicare Other | Attending: Cardiology | Admitting: Cardiology

## 2015-10-24 ENCOUNTER — Observation Stay (HOSPITAL_COMMUNITY): Payer: Medicare Other

## 2015-10-24 ENCOUNTER — Emergency Department (HOSPITAL_COMMUNITY): Payer: Medicare Other

## 2015-10-24 DIAGNOSIS — E669 Obesity, unspecified: Secondary | ICD-10-CM | POA: Insufficient documentation

## 2015-10-24 DIAGNOSIS — I208 Other forms of angina pectoris: Secondary | ICD-10-CM

## 2015-10-24 DIAGNOSIS — Z9889 Other specified postprocedural states: Secondary | ICD-10-CM | POA: Diagnosis not present

## 2015-10-24 DIAGNOSIS — F0390 Unspecified dementia without behavioral disturbance: Secondary | ICD-10-CM | POA: Insufficient documentation

## 2015-10-24 DIAGNOSIS — E119 Type 2 diabetes mellitus without complications: Secondary | ICD-10-CM

## 2015-10-24 DIAGNOSIS — R531 Weakness: Secondary | ICD-10-CM | POA: Diagnosis not present

## 2015-10-24 DIAGNOSIS — I480 Paroxysmal atrial fibrillation: Secondary | ICD-10-CM | POA: Insufficient documentation

## 2015-10-24 DIAGNOSIS — I4891 Unspecified atrial fibrillation: Secondary | ICD-10-CM | POA: Diagnosis present

## 2015-10-24 DIAGNOSIS — Z7901 Long term (current) use of anticoagulants: Secondary | ICD-10-CM

## 2015-10-24 DIAGNOSIS — K5732 Diverticulitis of large intestine without perforation or abscess without bleeding: Secondary | ICD-10-CM | POA: Insufficient documentation

## 2015-10-24 DIAGNOSIS — Z7984 Long term (current) use of oral hypoglycemic drugs: Secondary | ICD-10-CM | POA: Diagnosis not present

## 2015-10-24 DIAGNOSIS — Z7982 Long term (current) use of aspirin: Secondary | ICD-10-CM | POA: Insufficient documentation

## 2015-10-24 DIAGNOSIS — E785 Hyperlipidemia, unspecified: Secondary | ICD-10-CM | POA: Diagnosis not present

## 2015-10-24 DIAGNOSIS — M810 Age-related osteoporosis without current pathological fracture: Secondary | ICD-10-CM | POA: Insufficient documentation

## 2015-10-24 DIAGNOSIS — I4510 Unspecified right bundle-branch block: Secondary | ICD-10-CM | POA: Insufficient documentation

## 2015-10-24 DIAGNOSIS — I951 Orthostatic hypotension: Secondary | ICD-10-CM | POA: Diagnosis not present

## 2015-10-24 DIAGNOSIS — Z859 Personal history of malignant neoplasm, unspecified: Secondary | ICD-10-CM | POA: Insufficient documentation

## 2015-10-24 DIAGNOSIS — K589 Irritable bowel syndrome without diarrhea: Secondary | ICD-10-CM | POA: Diagnosis not present

## 2015-10-24 DIAGNOSIS — M1990 Unspecified osteoarthritis, unspecified site: Secondary | ICD-10-CM | POA: Diagnosis not present

## 2015-10-24 DIAGNOSIS — K219 Gastro-esophageal reflux disease without esophagitis: Secondary | ICD-10-CM | POA: Diagnosis not present

## 2015-10-24 DIAGNOSIS — I2510 Atherosclerotic heart disease of native coronary artery without angina pectoris: Secondary | ICD-10-CM | POA: Diagnosis not present

## 2015-10-24 DIAGNOSIS — D649 Anemia, unspecified: Secondary | ICD-10-CM | POA: Insufficient documentation

## 2015-10-24 DIAGNOSIS — I440 Atrioventricular block, first degree: Secondary | ICD-10-CM | POA: Diagnosis not present

## 2015-10-24 DIAGNOSIS — Z880 Allergy status to penicillin: Secondary | ICD-10-CM | POA: Insufficient documentation

## 2015-10-24 DIAGNOSIS — E042 Nontoxic multinodular goiter: Secondary | ICD-10-CM | POA: Insufficient documentation

## 2015-10-24 DIAGNOSIS — N179 Acute kidney failure, unspecified: Secondary | ICD-10-CM | POA: Diagnosis not present

## 2015-10-24 DIAGNOSIS — Z8701 Personal history of pneumonia (recurrent): Secondary | ICD-10-CM | POA: Insufficient documentation

## 2015-10-24 DIAGNOSIS — R079 Chest pain, unspecified: Principal | ICD-10-CM | POA: Diagnosis present

## 2015-10-24 DIAGNOSIS — Z79899 Other long term (current) drug therapy: Secondary | ICD-10-CM | POA: Diagnosis not present

## 2015-10-24 DIAGNOSIS — I10 Essential (primary) hypertension: Secondary | ICD-10-CM | POA: Diagnosis not present

## 2015-10-24 DIAGNOSIS — I200 Unstable angina: Secondary | ICD-10-CM | POA: Diagnosis present

## 2015-10-24 DIAGNOSIS — E1140 Type 2 diabetes mellitus with diabetic neuropathy, unspecified: Secondary | ICD-10-CM | POA: Insufficient documentation

## 2015-10-24 DIAGNOSIS — M4800 Spinal stenosis, site unspecified: Secondary | ICD-10-CM | POA: Diagnosis not present

## 2015-10-24 LAB — PROTIME-INR
INR: 1.27 (ref 0.00–1.49)
PROTHROMBIN TIME: 16.1 s — AB (ref 11.6–15.2)

## 2015-10-24 LAB — NM MYOCAR MULTI W/SPECT W/WALL MOTION / EF
CHL CUP MPHR: 131 {beats}/min
CHL CUP RESTING HR STRESS: 81 {beats}/min
CHL RATE OF PERCEIVED EXERTION: 0
CSEPEDS: 0 s
CSEPEW: 1 METS
CSEPPHR: 92 {beats}/min
Exercise duration (min): 0 min
Percent HR: 70 %

## 2015-10-24 LAB — TROPONIN I: Troponin I: <0.03 ng/mL (ref <0.031)

## 2015-10-24 LAB — CBC
HEMATOCRIT: 40.5 % (ref 36.0–46.0)
HEMOGLOBIN: 13 g/dL (ref 12.0–15.0)
MCH: 29.7 pg (ref 26.0–34.0)
MCHC: 32.1 g/dL (ref 30.0–36.0)
MCV: 92.7 fL (ref 78.0–100.0)
Platelets: 194 10*3/uL (ref 150–400)
RBC: 4.37 MIL/uL (ref 3.87–5.11)
RDW: 14.1 % (ref 11.5–15.5)
WBC: 6 10*3/uL (ref 4.0–10.5)

## 2015-10-24 LAB — BASIC METABOLIC PANEL
ANION GAP: 9 (ref 5–15)
BUN: 13 mg/dL (ref 6–20)
CALCIUM: 9.5 mg/dL (ref 8.9–10.3)
CO2: 27 mmol/L (ref 22–32)
Chloride: 104 mmol/L (ref 101–111)
Creatinine, Ser: 1.04 mg/dL — ABNORMAL HIGH (ref 0.44–1.00)
GFR calc non Af Amer: 46 mL/min — ABNORMAL LOW (ref >60)
GFR, EST AFRICAN AMERICAN: 54 mL/min — AB (ref >60)
GLUCOSE: 113 mg/dL — AB (ref 65–99)
POTASSIUM: 4 mmol/L (ref 3.5–5.1)
Sodium: 140 mmol/L (ref 135–145)

## 2015-10-24 LAB — TSH: TSH: 1.094 u[IU]/mL (ref 0.350–4.500)

## 2015-10-24 LAB — MAGNESIUM: Magnesium: 1.9 mg/dL (ref 1.7–2.4)

## 2015-10-24 LAB — I-STAT TROPONIN, ED
Troponin i, poc: 0 ng/mL (ref 0.00–0.08)
Troponin i, poc: 0 ng/mL (ref 0.00–0.08)

## 2015-10-24 LAB — T4, FREE: Free T4: 0.93 ng/dL (ref 0.61–1.12)

## 2015-10-24 MED ORDER — ALPRAZOLAM 0.25 MG PO TABS: 0.2500 mg | ORAL_TABLET | Freq: Two times a day (BID) | ORAL | Status: DC | PRN

## 2015-10-24 MED ORDER — OXYBUTYNIN CHLORIDE 5 MG PO TABS
5.0000 mg | ORAL_TABLET | Freq: Every day | ORAL | Status: DC
  Administered 2015-10-24 – 2015-10-25 (×2): 5 mg via ORAL
  Filled 2015-10-24 (×2): qty 1

## 2015-10-24 MED ORDER — ROSUVASTATIN CALCIUM 10 MG PO TABS
5.0000 mg | ORAL_TABLET | Freq: Every day | ORAL | Status: DC
  Administered 2015-10-24: 5 mg via ORAL
  Filled 2015-10-24: qty 1

## 2015-10-24 MED ORDER — NITROGLYCERIN 2 % TD OINT
0.5000 [in_us] | TOPICAL_OINTMENT | Freq: Four times a day (QID) | TRANSDERMAL | Status: DC
  Administered 2015-10-25 (×3): 0.5 [in_us] via TOPICAL
  Filled 2015-10-24 (×3): qty 0.5

## 2015-10-24 MED ORDER — NITROGLYCERIN 0.4 MG SL SUBL: 0.4000 mg | SUBLINGUAL_TABLET | SUBLINGUAL | Status: DC | PRN

## 2015-10-24 MED ORDER — GABAPENTIN 600 MG PO TABS
600.0000 mg | ORAL_TABLET | Freq: Two times a day (BID) | ORAL | Status: DC
  Administered 2015-10-24 – 2015-10-25 (×2): 600 mg via ORAL
  Filled 2015-10-24 (×2): qty 1

## 2015-10-24 MED ORDER — ONDANSETRON HCL 4 MG/2ML IJ SOLN: 4.0000 mg | Freq: Four times a day (QID) | INTRAMUSCULAR | Status: DC | PRN

## 2015-10-24 MED ORDER — ASPIRIN 300 MG RE SUPP: 300.0000 mg | RECTAL | Status: AC

## 2015-10-24 MED ORDER — NITROGLYCERIN 0.4 MG SL SUBL
0.4000 mg | SUBLINGUAL_TABLET | Freq: Once | SUBLINGUAL | Status: AC
  Administered 2015-10-24: 0.4 mg via SUBLINGUAL
  Filled 2015-10-24: qty 1

## 2015-10-24 MED ORDER — ASPIRIN 81 MG PO CHEW
324.0000 mg | CHEWABLE_TABLET | ORAL | Status: AC
  Administered 2015-10-24: 324 mg via ORAL
  Filled 2015-10-24: qty 4

## 2015-10-24 MED ORDER — TECHNETIUM TC 99M SESTAMIBI - CARDIOLITE
30.0000 | Freq: Once | INTRAVENOUS | Status: AC | PRN
  Administered 2015-10-24: 30 via INTRAVENOUS

## 2015-10-24 MED ORDER — SODIUM CHLORIDE 0.9 % IV SOLN: INTRAVENOUS | Status: DC

## 2015-10-24 MED ORDER — REGADENOSON 0.4 MG/5ML IV SOLN
0.4000 mg | Freq: Once | INTRAVENOUS | Status: AC
  Administered 2015-10-24: 0.4 mg via INTRAVENOUS
  Filled 2015-10-24: qty 5

## 2015-10-24 MED ORDER — ESCITALOPRAM OXALATE 10 MG PO TABS
10.0000 mg | ORAL_TABLET | Freq: Every day | ORAL | Status: DC
  Administered 2015-10-24 – 2015-10-25 (×2): 10 mg via ORAL
  Filled 2015-10-24 (×2): qty 1

## 2015-10-24 MED ORDER — APIXABAN 5 MG PO TABS
5.0000 mg | ORAL_TABLET | Freq: Two times a day (BID) | ORAL | Status: DC
  Administered 2015-10-24 – 2015-10-25 (×2): 5 mg via ORAL
  Filled 2015-10-24 (×2): qty 1

## 2015-10-24 MED ORDER — CALCIUM CITRATE-VITAMIN D 500-400 MG-UNIT PO CHEW
2.0000 | CHEWABLE_TABLET | Freq: Two times a day (BID) | ORAL | Status: DC
  Filled 2015-10-24: qty 2

## 2015-10-24 MED ORDER — CALCIUM 1200 1200-1000 MG-UNIT PO CHEW: 1200.0000 | CHEWABLE_TABLET | Freq: Two times a day (BID) | ORAL | Status: DC

## 2015-10-24 MED ORDER — ASPIRIN EC 81 MG PO TBEC
81.0000 mg | DELAYED_RELEASE_TABLET | Freq: Every day | ORAL | Status: DC
  Administered 2015-10-25: 81 mg via ORAL
  Filled 2015-10-24: qty 1

## 2015-10-24 MED ORDER — POLYETHYLENE GLYCOL 3350 17 G PO PACK
17.0000 g | PACK | Freq: Every day | ORAL | Status: DC | PRN
  Administered 2015-10-24: 17 g via ORAL
  Filled 2015-10-24: qty 1

## 2015-10-24 MED ORDER — POLYETHYLENE GLYCOL 3350 17 GM/SCOOP PO POWD
17.0000 g | Freq: Every day | ORAL | Status: DC | PRN
  Filled 2015-10-24: qty 255

## 2015-10-24 MED ORDER — PRESERVISION AREDS 2 PO CAPS: ORAL_CAPSULE | Freq: Every day | ORAL | Status: DC

## 2015-10-24 MED ORDER — TECHNETIUM TC 99M SESTAMIBI GENERIC - CARDIOLITE
10.0000 | Freq: Once | INTRAVENOUS | Status: AC | PRN
  Administered 2015-10-24: 10 via INTRAVENOUS

## 2015-10-24 MED ORDER — CALCIUM CARBONATE-VITAMIN D 500-200 MG-UNIT PO TABS
2.0000 | ORAL_TABLET | Freq: Two times a day (BID) | ORAL | Status: DC
  Administered 2015-10-24 – 2015-10-25 (×2): 2 via ORAL
  Filled 2015-10-24 (×2): qty 2

## 2015-10-24 MED ORDER — GLIPIZIDE ER 5 MG PO TB24
5.0000 mg | ORAL_TABLET | Freq: Every day | ORAL | Status: DC
  Administered 2015-10-25: 5 mg via ORAL
  Filled 2015-10-24: qty 1

## 2015-10-24 MED ORDER — ACETAMINOPHEN 325 MG PO TABS
650.0000 mg | ORAL_TABLET | ORAL | Status: DC | PRN
  Administered 2015-10-24: 650 mg via ORAL
  Filled 2015-10-24: qty 2

## 2015-10-24 MED ORDER — AMLODIPINE BESYLATE 2.5 MG PO TABS
2.5000 mg | ORAL_TABLET | Freq: Two times a day (BID) | ORAL | Status: DC
  Administered 2015-10-24 – 2015-10-25 (×2): 2.5 mg via ORAL
  Filled 2015-10-24 (×2): qty 1

## 2015-10-24 MED ORDER — PANTOPRAZOLE SODIUM 40 MG PO TBEC
40.0000 mg | DELAYED_RELEASE_TABLET | Freq: Two times a day (BID) | ORAL | Status: DC
  Administered 2015-10-24 – 2015-10-25 (×3): 40 mg via ORAL
  Filled 2015-10-24 (×3): qty 1

## 2015-10-24 MED ORDER — REGADENOSON 0.4 MG/5ML IV SOLN
INTRAVENOUS | Status: AC
  Filled 2015-10-24: qty 5

## 2015-10-24 NOTE — Progress Notes (Signed)
lexiscan myoview completed without complications  

## 2015-10-24 NOTE — ED Provider Notes (Signed)
CSN: [id number]     Arrival date & time 10/24/15  0536 History   First MD Initiated Contact with Patient 10/24/15 0545     Chief Complaint  Patient presents with  . Weakness  . Chest Pain     (Consider location/radiation/quality/duration/timing/severity/associated sxs/prior Treatment) HPI Comments: Patient presents to the ER for evaluation of chest pain. Patient reports that she began to experience chest pain overnight. She feels very weak. She is normally able to ambulate around her home, but was unable to walk this morning. She has been experiencing nausea and vomiting. There has not been any diarrhea. Patient denies shortness of breath.  Patient is a 82 y.o. female presenting with weakness and chest pain.  Weakness Associated symptoms include chest pain.  Chest Pain Associated symptoms: weakness     Past Medical History  Diagnosis Date  . Allergic rhinitis   . Anemia     NOS chronic disease. Hgb 11.6gm% 04/14/2009  . CAD (coronary artery disease)     a. s/p DES mLAD 2005. b. Cutting balloon angioplasty for ISR 2006. c. LHC 08/2010 nonobstructive. d. Myoview 06/2012: small anteroapical infarct/mod inferolat wall infarct but no ischemia, EF 75%.  . Diverticulitis of colon   . GERD (gastroesophageal reflux disease)   . IBS (irritable bowel syndrome)   . Spinal stenosis     djd lumbar  . Multinodular goiter   . Orthostatic hypotension   . Obesity     BMI-37  . Acute renal failure ([place])     due to dehydration-Sept 2009; Normal creat 1.2 on fu 05/13/2009  . Hx of colonoscopy   . Cancer ([place])     on nose and had it removed  . DEMENTIA     slight  . Pneumonia   . PAF (paroxysmal atrial fibrillation) (HCC)     noted on event monitor. No correlation with episodes and symptoms  . Chronic anticoagulation   . RBBB   . 1St degree AV block   . Diabetes mellitus type II   . DM neuropathy, type II diabetes mellitus ([place])   . Hyperlipidemia   . HTN (hypertension)   . Osteoarthritis      lower back  . Rheumatoid arthritis ([place])   . Osteoporosis    Past Surgical History  Procedure Laterality Date  . Cholecystectomy    . Abdominal hysterectomy    . Nasal sinus surgery      x2  . Bilateral arthroscopic knee surgery    . Stent surgery    . Coronary angioplasty      stent  . Carpel tunnel wrist rt     Family History  Problem Relation Age of Onset  . Breast cancer Sister   . Heart disease Brother   . Colon cancer Neg Hx   . Heart attack Son   . Heart attack Daughter   . Heart attack Father   . Stroke Neg Hx    Social History  Substance Use Topics  . Smoking status: Never Smoker   . Smokeless tobacco: Never Used  . Alcohol Use: No   OB History    No data available     Review of Systems  Cardiovascular: Positive for chest pain.  Neurological: Positive for weakness.  All other systems reviewed and are negative.     Allergies  Amoxicillin  Home Medications   Prior to Admission medications   Medication Sig Start Date End Date Taking? Authorizing Provider  amLODipine (NORVASC) 2.5 MG tablet Take 2.5 mg  by mouth 2 (two) times daily.    Yes Historical Provider, MD  apixaban (ELIQUIS) 5 MG TABS tablet Take 1 tablet (5 mg total) by mouth 2 (two) times daily. 01/31/15  Yes Liliane Shi, PA-C  aspirin 81 MG EC tablet Take 81 mg by mouth daily.     Yes Historical Provider, MD  Calcium Carbonate-Vit D-Min (CALCIUM 1200 PO) Take 1 tablet by mouth 2 (two) times daily.    Yes Historical Provider, MD  escitalopram (LEXAPRO) 10 MG tablet Take 10 mg by mouth daily.   Yes Historical Provider, MD  gabapentin (NEURONTIN) 600 MG tablet Take 600 mg by mouth 2 (two) times daily.  10/25/13  Yes Historical Provider, MD  glipiZIDE (GLUCOTROL XL) 5 MG 24 hr tablet Take 5 mg by mouth daily with breakfast.   Yes Historical Provider, MD  Multiple Vitamins-Minerals (CENTRUM SILVER PO) Take 1 tablet by mouth daily.    Yes Historical Provider, MD  Multiple Vitamins-Minerals  (PRESERVISION AREDS 2 PO) Take 2 tablets by mouth daily.   Yes Historical Provider, MD  omeprazole (PRILOSEC) 40 MG capsule Take 40 mg by mouth daily.   Yes Historical Provider, MD  oxybutynin (DITROPAN) 5 MG tablet Take 5 mg by mouth daily.   Yes Historical Provider, MD  polyethylene glycol powder (GLYCOLAX/MIRALAX) powder Take 17 g by mouth daily as needed for mild constipation.  12/08/14  Yes Historical Provider, MD  rosuvastatin (CRESTOR) 5 MG tablet Take 5 mg by mouth at bedtime.   Yes Historical Provider, MD   BP 134/74 mmHg  Pulse 79  Resp 20  Ht 5\' 4"  (1.626 m)  Wt 171 lb (77.565 kg)  BMI 29.34 kg/m2  SpO2 90% Physical Exam  Constitutional: She is oriented to person, place, and time. She appears well-developed and well-nourished. No distress.  HENT:  Head: Normocephalic and atraumatic.  Right Ear: Hearing normal.  Left Ear: Hearing normal.  Nose: Nose normal.  Mouth/Throat: Oropharynx is clear and moist and mucous membranes are normal.  Eyes: Conjunctivae and EOM are normal. Pupils are equal, round, and reactive to light.  Neck: Normal range of motion. Neck supple.  Cardiovascular: Regular rhythm, S1 normal and S2 normal.  Exam reveals no gallop and no friction rub.   No murmur heard. Pulmonary/Chest: Effort normal and breath sounds normal. No respiratory distress. She exhibits no tenderness.  Abdominal: Soft. Normal appearance and bowel sounds are normal. There is no hepatosplenomegaly. There is no tenderness. There is no rebound, no guarding, no tenderness at McBurney's point and negative Murphy's sign. No hernia.  Musculoskeletal: Normal range of motion.  Neurological: She is alert and oriented to person, place, and time. She has normal strength. No cranial nerve deficit or sensory deficit. Coordination normal. GCS eye subscore is 4. GCS verbal subscore is 5. GCS motor subscore is 6.  Skin: Skin is warm, dry and intact. No rash noted. No cyanosis.  Psychiatric: She has a  normal mood and affect. Her speech is normal and behavior is normal. Thought content normal.  Nursing note and vitals reviewed.   ED Course  Procedures (including critical care time) Labs Review Labs Reviewed  PROTIME-INR - Abnormal; Notable for the following:    Prothrombin Time 16.1 (*)    All other components within normal limits  CBC  BASIC METABOLIC PANEL  TROPONIN I  Randolm Idol, ED    Imaging Review Dg Chest 2 View  10/24/2015  CLINICAL DATA:  Acute onset of generalized chest pain. Initial encounter.  EXAM: CHEST  2 VIEW COMPARISON:  Chest radiograph from 12/17/2014 FINDINGS: The lungs are well-aerated. Vascular congestion is noted. Mild left basilar opacity likely reflects atelectasis. There is no evidence of pleural effusion or pneumothorax. The heart is borderline normal in size. No acute osseous abnormalities are seen. IMPRESSION: Vascular congestion noted. Mild left basilar airspace opacity likely reflects atelectasis. Electronically Signed   By: Garald Balding M.D.   On: 10/24/2015 06:09   I have personally reviewed and evaluated these images and lab results as part of my medical decision-making.   EKG Interpretation   Date/Time:  Monday October 24 2015 05:37:17 EDT Ventricular Rate:  76 PR Interval:  176 QRS Duration: 156 QT Interval:  423 QTC Calculation: 476 R Axis:   65 Text Interpretation:  Sinus rhythm Atrial premature complex Right bundle  branch block No significant change since last tracing Confirmed by Wardell Pokorski   MD, Jermya Dowding [phone]) on 10/24/2015 5:54:19 AM      MDM   Final diagnoses:  Chest pain, unspecified chest pain type    Presents to emergency part for evaluation of chest pain. Patient reports that she developed chest pain overnight at home. Pain is moderate, 4 out of 10. She has had generalized weakness associated with the pain and has vomited one time. Reviewing her records reveals that she does have a history of coronary artery disease.  She did have nuclear medicine stress test within the last year that did not show any acute ischemia. Will ask cardiology to consult on her to determine disposition.    Orpah Greek, MD 10/24/15 [phone]

## 2015-10-24 NOTE — H&P (Addendum)
[name] is an 82 y.o. female.    Primary Cardiologist:Dr. Harrington Challenger PCP: Chesley Noon, MD  Chief Complaint: chest pain  HPI: 82 year old female with hx of CAD, s/p prior Taxus DES to mLAD in 2005 and subsequent cutting balloon angioplasty due to ISR in 2006. Last LHC in 2/12 demonstrated LAD 40%, patent stent, prox and mid RCA 30% and EF 60%. Echo in 5/13: mild LVH, EF 55%, mild BAE, mild AI. She also has a h/o AFib, DM2, HTN, HLP, anemia, GERD, IBS, DJD and multinodular goiter.  nuc study 2016 was low risk EF >65% She is on Eliquis for anticoagulation presents to ER with chest pain.   Pt was awakened from sleep with chest pressure around 0300 today she walked with walker to living room and was so weak did not think she would make it.  Mildly diaphoretic + vomiting.  She thought maybe glucose was low so ate 5 chocolate kisses.   But no better.  Call her son and they came to ER.     One NTG here and she is better, pressure is gone but tender mid sternal.  No more nausea  Though still some weakness.    EKG without acute changes a fib  RBBB troponins <0.03; 0.00; 0.00.     CXR: Vascular congestion noted. Mild left basilar airspace opacity likely reflects atelectasis  Past Medical History  Diagnosis Date  . Allergic rhinitis   . Anemia     NOS chronic disease. Hgb 11.6gm% 04/14/2009  . CAD (coronary artery disease)     a. s/p DES mLAD 2005. b. Cutting balloon angioplasty for ISR 2006. c. LHC 08/2010 nonobstructive. d. Myoview 06/2012: small anteroapical infarct/mod inferolat wall infarct but no ischemia, EF 75%.  . Diverticulitis of colon   . GERD (gastroesophageal reflux disease)   . IBS (irritable bowel syndrome)   . Spinal stenosis     djd lumbar  . Multinodular goiter   . Orthostatic hypotension   . Obesity     BMI-37  . Acute renal failure ([place])     due to dehydration-Sept 2009; Normal creat 1.2 on fu 05/13/2009  . Hx of colonoscopy   . Cancer ([place])    on nose and had it removed  . DEMENTIA     slight  . Pneumonia   . PAF (paroxysmal atrial fibrillation) (H[facility])     noted on event monitor. No correlation with episodes and symptoms  . Chronic anticoagulation   . RBBB   . 1St degree AV block   . Diabetes mellitus type II   . DM neuropathy, type II diabetes mellitus ([place])   . Hyperlipidemia   . HTN (hypertension)   . Osteoarthritis     lower back  . Rheumatoid arthritis ([place])   . Osteoporosis     Past Surgical History  Procedure Laterality Date  . Cholecystectomy    . Abdominal hysterectomy    . Nasal sinus surgery      x2  . Bilateral arthroscopic knee surgery    . Stent surgery    . Coronary angioplasty      stent  . Carpel tunnel wrist rt     ANGIOGRAPHIC FINDINGS: 1. The left main coronary artery had no obstructive disease. 2. Left anterior descending was a large-caliber vessel that coursed to  the apex. There was a moderate-sized diagonal branch that had mild  plaque disease. Beyond this diagonal branch, there was a  40%  stenosis followed by a stent that was patent. The caliber of the  left anterior descending artery beyond the diagonal branch was  significantly smaller than the proximal vessel. There were no flow-  limiting lesions noted in this vessel. 3. The circumflex artery gave off an early small-caliber obtuse  marginal branch and a large bifurcating second obtuse marginal  branch. There was plaque disease, but no flow-limiting lesions. 4. The right coronary artery was a moderate-sized dominant vessel with  serial 30% lesions throughout the proximal and mid vessel. 5. Left ventricular angiogram was performed in the RAO projection and  showed normal left ventricular systolic function with ejection  fraction of 60%. The aortic root was not dilated.  IMPRESSION: 1. Single-vessel coronary artery disease. 2. Patent stent in the mid left anterior descending artery. 3.  Normal left ventricular systolic function. 4. No enlargement of the aortic root.  Family History  Problem Relation Age of Onset  . Breast cancer Sister   . Heart disease Brother   . Colon cancer Neg Hx   . Heart attack Son   . Heart attack Daughter   . Heart attack Father   . Stroke Neg Hx    Social History:  reports that she has never smoked. She has never used smokeless tobacco. She reports that she does not drink alcohol or use illicit drugs.  Allergies:  Allergies  Allergen Reactions  . Amoxicillin Other (See Comments)    UNKNOWN    OUTPATIENT MEDICATIONS: No current facility-administered medications on file prior to encounter.   Current Outpatient Prescriptions on File Prior to Encounter  Medication Sig Dispense Refill  . amLODipine (NORVASC) 2.5 MG tablet Take 2.5 mg by mouth 2 (two) times daily.     Marland Kitchen apixaban (ELIQUIS) 5 MG TABS tablet Take 1 tablet (5 mg total) by mouth 2 (two) times daily.    Marland Kitchen aspirin 81 MG EC tablet Take 81 mg by mouth daily.      . Calcium Carbonate-Vit D-Min (CALCIUM 1200 PO) Take 1 tablet by mouth 2 (two) times daily.     Marland Kitchen escitalopram (LEXAPRO) 10 MG tablet Take 10 mg by mouth daily.    Marland Kitchen gabapentin (NEURONTIN) 600 MG tablet Take 600 mg by mouth 2 (two) times daily.     Marland Kitchen glipiZIDE (GLUCOTROL XL) 5 MG 24 hr tablet Take 5 mg by mouth daily with breakfast.    . Multiple Vitamins-Minerals (CENTRUM SILVER PO) Take 1 tablet by mouth daily.     . Multiple Vitamins-Minerals (PRESERVISION AREDS 2 PO) Take 2 tablets by mouth daily.    Marland Kitchen oxybutynin (DITROPAN) 5 MG tablet Take 5 mg by mouth daily.    . polyethylene glycol powder (GLYCOLAX/MIRALAX) powder Take 17 g by mouth daily as needed for mild constipation.     . rosuvastatin (CRESTOR) 5 MG tablet Take 5 mg by mouth at bedtime.       Results for orders placed or performed during the hospital encounter of 10/24/15 (from the past 48 hour(s))  Basic metabolic panel     Status: Abnormal   Collection  Time: 10/24/15  6:20 AM  Result Value Ref Range   Sodium 140 135 - 145 mmol/L   Potassium 4.0 3.5 - 5.1 mmol/L   Chloride 104 101 - 111 mmol/L   CO2 27 22 - 32 mmol/L   Glucose, Bld 113 (H) 65 - 99 mg/dL   BUN 13 6 - 20 mg/dL   Creatinine, Ser 1.04 (H) 0.44 -  1.00 mg/dL   Calcium 9.5 8.9 - 10.3 mg/dL   GFR calc non Af Amer 46 (L) >60 mL/min   GFR calc Af Amer 54 (L) >60 mL/min    Comment: (NOTE) The eGFR has been calculated using the CKD EPI equation. This calculation has not been validated in all clinical situations. eGFR's persistently <60 mL/min signify possible Chronic Kidney Disease.    Anion gap 9 5 - 15  CBC     Status: None   Collection Time: 10/24/15  6:20 AM  Result Value Ref Range   WBC 6.0 4.0 - 10.5 K/uL   RBC 4.37 3.87 - 5.11 MIL/uL   Hemoglobin 13.0 12.0 - 15.0 g/dL   HCT 40.5 36.0 - 46.0 %   MCV 92.7 78.0 - 100.0 fL   MCH 29.7 26.0 - 34.0 pg   MCHC 32.1 30.0 - 36.0 g/dL   RDW 14.1 11.5 - 15.5 %   Platelets 194 150 - 400 K/uL  Troponin I     Status: None   Collection Time: 10/24/15  6:20 AM  Result Value Ref Range   Troponin I <0.03 <0.031 ng/mL    Comment:        NO INDICATION OF MYOCARDIAL INJURY.   Protime-INR - (order if Patient is taking Coumadin / Warfarin)     Status: Abnormal   Collection Time: 10/24/15  6:20 AM  Result Value Ref Range   Prothrombin Time 16.1 (H) 11.6 - 15.2 seconds   INR 1.27 0.00 - 1.49  I-stat troponin, ED (not at [facility])     Status: None   Collection Time: 10/24/15  6:24 AM  Result Value Ref Range   Troponin i, poc 0.00 0.00 - 0.08 ng/mL   Comment 3            Comment: Due to the release kinetics of cTnI, a negative result within the first hours of the onset of symptoms does not rule out myocardial infarction with certainty. If myocardial infarction is still suspected, repeat the test at appropriate intervals.    Dg Chest 2 View  10/24/2015  CLINICAL DATA:  Acute onset of generalized chest pain. Initial encounter.  EXAM: CHEST  2 VIEW COMPARISON:  Chest radiograph from 12/17/2014 FINDINGS: The lungs are well-aerated. Vascular congestion is noted. Mild left basilar opacity likely reflects atelectasis. There is no evidence of pleural effusion or pneumothorax. The heart is borderline normal in size. No acute osseous abnormalities are seen. IMPRESSION: Vascular congestion noted. Mild left basilar airspace opacity likely reflects atelectasis. Electronically Signed   By: Garald Balding M.D.   On: 10/24/2015 06:09    ROS: General:no colds or fevers, no weight changes Skin:no rashes or ulcers HEENT:no blurred vision, no congestion CV:see HPI PUL:see HPI GI:no diarrhea constipation or melena, no indigestion GU:no hematuria, no dysuria MS:no joint pain, no claudication Neuro:no syncope, no lightheadedness Endo:+ diabetes, no thyroid disease   Blood pressure 101/65, pulse 71, resp. rate 18, height 5' 4"  (1.626 m), weight 171 lb (77.565 kg), SpO2 93 %. PE: General:Pleasant affect, NAD Skin:Warm and dry, brisk capillary refill HEENT:normocephalic, sclera clear, mucus membranes moist Neck:supple, no JVD, no bruits  Heart:S1S2 RRR without murmur, gallup, rub or click Lungs:clear without rales, rhonchi, or wheezes VEH:MCNO, non tender, + BS, do not palpate liver spleen or masses Ext:no lower ext edema, 2+ pedal pulses, 2+ radial pulses Neuro:alert and oriented X 3, MAE, follows commands, + facial symmetry    Assessment/Plan Principal Problem:   CHEST PAIN-  may be cardiac vs GERD with freq burping.  She is on Prilosec.  Will continue PPI, bring ---MD to see, ? Repeat nuc with neg troponins.   Active Problems:   HLD (hyperlipidemia) continue statin   GERD- protonix BID    Coronary artery disease- hx of stents   Atrial fibrillation, permanent on Eliquis   DM2 (diabetes mellitus, type 2) ([place])   HTN (hypertension)   Chronic anticoagulation for A fib, CHA2DS2VASc = 6--Eliquis   [facility] R Nurse  Practitioner Certified [place] Pager [phone] or after 5pm or weekends call [phone] 10/24/2015, 9:33 AM  The patient was seen, examined and discussed with [name], NP and I agree with the above.   A 82 year old female with known CAD, multiple interventions in the past as described above who presented with chest pain with some typical and some atypical features. Improved with NTG, Troponin is negative x3, ECG is unchanged and shows SR, RBBB that is chronic.  She is currently still experiencing some pressure. BP is controlled. We will plan for a Lexiscan nuclear stress test today. Treat with a GI coctail as possibly GERD.  Dorothy Spark 10/24/2015

## 2015-10-24 NOTE — Progress Notes (Signed)
Pt notified of normal stress test.  Plan home tomorrow if stable tonight.

## 2015-10-24 NOTE — ED Notes (Signed)
Pt from home with Chest pain and weakness. Pt rates the pain 4-10. Pt normally walks but was unable to this evening. Pt has had N/V.

## 2015-10-24 NOTE — ED Notes (Signed)
Taken to xray at this time. 

## 2015-10-25 DIAGNOSIS — I2510 Atherosclerotic heart disease of native coronary artery without angina pectoris: Secondary | ICD-10-CM | POA: Diagnosis not present

## 2015-10-25 DIAGNOSIS — R531 Weakness: Secondary | ICD-10-CM | POA: Diagnosis not present

## 2015-10-25 DIAGNOSIS — Z7901 Long term (current) use of anticoagulants: Secondary | ICD-10-CM | POA: Diagnosis not present

## 2015-10-25 DIAGNOSIS — I480 Paroxysmal atrial fibrillation: Secondary | ICD-10-CM | POA: Diagnosis not present

## 2015-10-25 DIAGNOSIS — R079 Chest pain, unspecified: Secondary | ICD-10-CM | POA: Diagnosis not present

## 2015-10-25 DIAGNOSIS — D649 Anemia, unspecified: Secondary | ICD-10-CM | POA: Diagnosis not present

## 2015-10-25 LAB — LIPID PANEL
CHOLESTEROL: 145 mg/dL (ref 0–200)
HDL: 52 mg/dL (ref >40)
LDL Cholesterol: 76 mg/dL (ref 0–99)
TRIGLYCERIDES: 84 mg/dL (ref <150)
Total CHOL/HDL Ratio: 2.8 RATIO
VLDL: 17 mg/dL (ref 0–40)

## 2015-10-25 LAB — BASIC METABOLIC PANEL
Anion gap: 13 (ref 5–15)
BUN: 14 mg/dL (ref 6–20)
CALCIUM: 9.3 mg/dL (ref 8.9–10.3)
CHLORIDE: 103 mmol/L (ref 101–111)
CO2: 25 mmol/L (ref 22–32)
CREATININE: 1.05 mg/dL — AB (ref 0.44–1.00)
GFR, EST AFRICAN AMERICAN: 53 mL/min — AB (ref >60)
GFR, EST NON AFRICAN AMERICAN: 46 mL/min — AB (ref >60)
Glucose, Bld: 101 mg/dL — ABNORMAL HIGH (ref 65–99)
Potassium: 4.1 mmol/L (ref 3.5–5.1)
SODIUM: 141 mmol/L (ref 135–145)

## 2015-10-25 LAB — TROPONIN I: Troponin I: <0.03 ng/mL (ref <0.031)

## 2015-10-25 LAB — HEMOGLOBIN A1C
Hgb A1c MFr Bld: 6 % — ABNORMAL HIGH (ref 4.8–5.6)
Mean Plasma Glucose: 126 mg/dL

## 2015-10-25 MED ORDER — PANTOPRAZOLE SODIUM 40 MG PO TBEC: 40.0000 mg | DELAYED_RELEASE_TABLET | Freq: Two times a day (BID) | ORAL | Status: DC

## 2015-10-25 NOTE — Discharge Summary (Signed)
Discharge Summary    Patient ID: [name],  MRN: [id number], DOB/AGE: 1924-01-10 82 y.o.  Admit date: 10/24/2015 Discharge date: 10/25/2015  Primary Care Provider: Chesley Noon Primary Cardiologist: Dr Harrington Challenger  Discharge Diagnoses    Principal Problem:   CHEST PAIN Active Problems:   HLD (hyperlipidemia)   GERD   Coronary artery disease   Atrial fibrillation, permanent   DM2 (diabetes mellitus, type 2) (HCC)   HTN (hypertension)   Chronic anticoagulation for A fib, CHA2DS2VASc = 6   Unstable angina (HCC)   Allergies Allergies  Allergen Reactions  . Amoxicillin Other (See Comments)    UNKNOWN    Diagnostic Studies/Procedures    Lexiscan MV _____________   History of Present Illness     82 year old female with hx of CAD, s/p prior Taxus DES to mLAD in 2005 and subsequent cutting balloon angioplasty due to ISR in 2006. Last LHC in 2/12 demonstrated LAD 40%, patent stent, prox and mid RCA 30% and EF 60%. Echo in 5/13: mild LVH, EF 55%, mild BAE, mild AI. She also has a h/o AFib, DM2, HTN, HLP, anemia, GERD, IBS, DJD and multinodular goiter.She is on Eliquis for anticoagulation. She was admitted 04/03 with chest pain.   Hospital Course     Consultants: none   Cardiac enzymes were negative for MI. Lexiscan MV was performed, results below. It showed no scar or ischemia, EF 80%.  On 04/04, [name] was seen by Dr Meda Coffee and all data were reviewed. Her symptoms had resolved. Her labs had no significant abnormalities. A lipid profile and TSH were OK. Her VS were stable. She was ambulating without chest pain or SOB and considered stable for discharge, to follow up as an outpatient.  _____________  Discharge Vitals Blood pressure 124/62, pulse 89, temperature 98 F (36.7 C), temperature source Oral, resp. rate 16, height 5\' 4"  (1.626 m), weight 171 lb 11.2 oz (77.883 kg), SpO2 97 %.  Filed Weights   10/24/15 0540 10/25/15 0339  Weight: 171 lb (77.565 kg)  171 lb 11.2 oz (77.883 kg)    Labs & Radiologic Studies    CBC  Recent Labs  10/24/15 0620  WBC 6.0  HGB 13.0  HCT 40.5  MCV 92.7  PLT Q000111Q   Basic Metabolic Panel  Recent Labs  10/24/15 0620 10/24/15 1153 10/25/15 0329  NA 140  --  141  K 4.0  --  4.1  CL 104  --  103  CO2 27  --  25  GLUCOSE 113*  --  101*  BUN 13  --  14  CREATININE 1.04*  --  1.05*  CALCIUM 9.5  --  9.3  MG  --  1.9  --    Cardiac Enzymes  Recent Labs  10/24/15 1153 10/24/15 1706 10/24/15 2321  TROPONINI <0.03 <0.03 <0.03   Hemoglobin A1C  Recent Labs  10/24/15 1153  HGBA1C 6.0*   Fasting Lipid Panel  Recent Labs  10/25/15 0329  CHOL 145  HDL 52  LDLCALC 76  TRIG 84  CHOLHDL 2.8   Thyroid Function Tests  Recent Labs  10/24/15 1153  TSH 1.094   _____________  Dg Chest 2 View 10/24/2015  CLINICAL DATA:  Acute onset of generalized chest pain. Initial encounter. EXAM: CHEST  2 VIEW COMPARISON:  Chest radiograph from 12/17/2014 FINDINGS: The lungs are well-aerated. Vascular congestion is noted. Mild left basilar opacity likely reflects atelectasis. There is no evidence of pleural effusion or  pneumothorax. The heart is borderline normal in size. No acute osseous abnormalities are seen. IMPRESSION: Vascular congestion noted. Mild left basilar airspace opacity likely reflects atelectasis. Electronically Signed   By: Garald Balding M.D.   On: 10/24/2015 06:09   Nm Myocar Multi W/spect W/wall Motion / Ef 10/24/2015  CLINICAL DATA:  Chest pain, angina at rest, 1 stent EXAM: MYOCARDIAL IMAGING WITH SPECT (REST AND PHARMACOLOGIC-STRESS) GATED LEFT VENTRICULAR WALL MOTION STUDY LEFT VENTRICULAR EJECTION FRACTION TECHNIQUE: Standard myocardial SPECT imaging was performed after resting intravenous injection of 10 mCi Tc-28m sestamibi. Subsequently, intravenous infusion of Lexiscan was performed under the supervision of the Cardiology staff. At peak effect of the drug, 30 mCi Tc-46m sestamibi was  injected intravenously and standard myocardial SPECT imaging was performed. Quantitative gated imaging was also performed to evaluate left ventricular wall motion, and estimate left ventricular ejection fraction. COMPARISON:  12/18/2014 FINDINGS: Perfusion: No decreased activity in the left ventricle on stress imaging to suggest reversible ischemia or infarction. Wall Motion: Normal left ventricular wall motion. No left ventricular dilation. Left Ventricular Ejection Fraction: 80 % End diastolic volume 50 ml End systolic volume 10 ml IMPRESSION: 1. No reversible ischemia or infarction. 2. Normal left ventricular wall motion. 3. Left ventricular ejection fraction 80%. 4. Low-risk stress test findings*. *2012 Appropriate Use Criteria for Coronary Revascularization Focused Update: J Am Coll Cardiol. [id number]. http://content.[email or web address].aspx?articleid=1201161 Electronically Signed   By: Julian Hy M.D.   On: 10/24/2015 16:58   Disposition   Pt is being discharged home today in good condition.  Follow-up Plans & Appointments    Follow-up Information    Follow up with [facility] On 11/16/2015.   Specialty:  Cardiology   Why:  Coumadin check at noon.   Contact information:   [address] [place] [phone]      Follow up with Dorris Carnes, MD On 11/18/2015.   Specialty:  Cardiology   Why:  See MD at 11:15 am, please arrive 15 minutes early for paperwork.   Contact information:   [address] [place] [address] [phone]      Discharge Instructions    Diet - low sodium heart healthy    Complete by:  As directed      Diet Carb Modified    Complete by:  As directed      Increase activity slowly    Complete by:  As directed            Discharge Medications   Current Discharge Medication List    START taking these medications   Details  pantoprazole (PROTONIX) 40 MG tablet  Take 1 tablet (40 mg total) by mouth 2 (two) times daily. Qty: 60 tablet, Refills: 11      CONTINUE these medications which have NOT CHANGED   Details  amLODipine (NORVASC) 2.5 MG tablet Take 2.5 mg by mouth 2 (two) times daily.     apixaban (ELIQUIS) 5 MG TABS tablet Take 1 tablet (5 mg total) by mouth 2 (two) times daily.    aspirin 81 MG EC tablet Take 81 mg by mouth daily.      Calcium Carbonate-Vit D-Min (CALCIUM 1200 PO) Take 1 tablet by mouth 2 (two) times daily.     escitalopram (LEXAPRO) 10 MG tablet Take 10 mg by mouth daily.    gabapentin (NEURONTIN) 600 MG tablet Take 600 mg by mouth 2 (two) times daily.     glipiZIDE (GLUCOTROL  XL) 5 MG 24 hr tablet Take 5 mg by mouth daily with breakfast.    Multiple Vitamins-Minerals (CENTRUM SILVER PO) Take 1 tablet by mouth daily.     Multiple Vitamins-Minerals (PRESERVISION AREDS 2 PO) Take 2 tablets by mouth daily.    oxybutynin (DITROPAN) 5 MG tablet Take 5 mg by mouth daily.    polyethylene glycol powder (GLYCOLAX/MIRALAX) powder Take 17 g by mouth daily as needed for mild constipation.     rosuvastatin (CRESTOR) 5 MG tablet Take 5 mg by mouth at bedtime.      STOP taking these medications     omeprazole (PRILOSEC) 40 MG capsule          Outstanding Labs/Studies   none  Duration of Discharge Encounter   Greater than 30 minutes including physician time.  Jonetta Speak NP 10/25/2015, 1:49 PM

## 2015-10-25 NOTE — Discharge Instructions (Signed)

## 2015-10-25 NOTE — Care Management Obs Status (Signed)
[facility] NOTIFICATION   Patient Details  Name: KARLI WESTBERRY MRN: [id number] Date of Birth: 28-Oct-1923   Medicare Observation Status Notification Given:  Yes    Bethena Roys, RN 10/25/2015, 2:02 PM

## 2015-10-25 NOTE — Progress Notes (Signed)
Patient Name: [name] Date of Encounter: 10/25/2015  Principal Problem:   CHEST PAIN Active Problems:   HLD (hyperlipidemia)   GERD   Coronary artery disease   Atrial fibrillation, permanent   DM2 (diabetes mellitus, type 2) (HCC)   HTN (hypertension)   Chronic anticoagulation for A fib, CHA2DS2VASc = 6   Unstable angina ([place])   Length of Stay:   SUBJECTIVE  No chest pain or SOB.  CURRENT MEDS . amLODipine  2.5 mg Oral BID  . apixaban  5 mg Oral BID  . aspirin EC  81 mg Oral Daily  . calcium-vitamin D  2 tablet Oral BID  . escitalopram  10 mg Oral Daily  . gabapentin  600 mg Oral BID  . glipiZIDE  5 mg Oral Q breakfast  . nitroGLYCERIN  0.5 inch Topical 4 times per day  . oxybutynin  5 mg Oral Daily  . pantoprazole  40 mg Oral BID  . rosuvastatin  5 mg Oral QHS   OBJECTIVE  Filed Vitals:   10/24/15 1554 10/24/15 2021 10/25/15 0339 10/25/15 1034  BP: 136/65 127/59 123/57 124/62  Pulse: 81 77 89   Temp: 97.9 F (36.6 C) 98.1 F (36.7 C) 98 F (36.7 C)   TempSrc: Oral Oral Oral   Resp: 20  16   Height:      Weight:   171 lb 11.2 oz (77.883 kg)   SpO2: 98% 98% 97%     Intake/Output Summary (Last 24 hours) at 10/25/15 1243 Last data filed at 10/25/15 1100  Gross per 24 hour  Intake    840 ml  Output      0 ml  Net    840 ml   Filed Weights   10/24/15 0540 10/25/15 0339  Weight: 171 lb (77.565 kg) 171 lb 11.2 oz (77.883 kg)   PHYSICAL EXAM  General: Pleasant, NAD. Neuro: Alert and oriented X 3. Moves all extremities spontaneously. Psych: Normal affect. HEENT:  Normal  Neck: Supple without bruits or JVD. Lungs:  Resp regular and unlabored, CTA. Heart: RRR no s3, s4, or murmurs. Abdomen: Soft, non-tender, non-distended, BS + x 4.  Extremities: No clubbing, cyanosis or edema. DP/PT/Radials 2+ and equal bilaterally.  Accessory Clinical Findings  CBC  Recent Labs  10/24/15 0620  WBC 6.0  HGB 13.0  HCT 40.5  MCV 92.7  PLT Q000111Q    Basic Metabolic Panel  Recent Labs  10/24/15 0620 10/24/15 1153 10/25/15 0329  NA 140  --  141  K 4.0  --  4.1  CL 104  --  103  CO2 27  --  25  GLUCOSE 113*  --  101*  BUN 13  --  14  CREATININE 1.04*  --  1.05*  CALCIUM 9.5  --  9.3  MG  --  1.9  --     Recent Labs  10/24/15 1153 10/24/15 1706 10/24/15 2321  TROPONINI <0.03 <0.03 <0.03    Recent Labs  10/24/15 1153  HGBA1C 6.0*   Fasting Lipid Panel  Recent Labs  10/25/15 0329  CHOL 145  HDL 52  LDLCALC 76  TRIG 84  CHOLHDL 2.8   Thyroid Function Tests  Recent Labs  10/24/15 1153  TSH 1.094   Radiology/Studies  Dg Chest 2 View  10/24/2015  CLINICAL DATA:  Acute onset of generalized chest pain. Initial encounter. EXAM: CHEST  2 VIEW COMPARISON:  Chest radiograph from 12/17/2014 FINDINGS: The lungs are well-aerated. Vascular congestion is  noted. Mild left basilar opacity likely reflects atelectasis. There is no evidence of pleural effusion or pneumothorax. The heart is borderline normal in size. No acute osseous abnormalities are seen. IMPRESSION: Vascular congestion noted. Mild left basilar airspace opacity likely reflects atelectasis. Electronically Signed   By: Garald Balding M.D.   On: 10/24/2015 06:09   Nm Myocar Multi W/spect W/wall Motion / Ef  10/24/2015  CLINICAL DATA:  Chest pain, angina at rest, 1 stent EXAM: MYOCARDIAL IMAGING WITH SPECT (REST AND PHARMACOLOGIC-STRESS) GATED LEFT VENTRICULAR WALL MOTION STUDY LEFT VENTRICULAR EJECTION FRACTION TECHNIQUE: Standard myocardial SPECT imaging was performed after resting intravenous injection of 10 mCi Tc-60m sestamibi. Subsequently, intravenous infusion of Lexiscan was performed under the supervision of the Cardiology staff. At peak effect of the drug, 30 mCi Tc-63m sestamibi was injected intravenously and standard myocardial SPECT imaging was performed. Quantitative gated imaging was also performed to evaluate left ventricular wall motion, and estimate  left ventricular ejection fraction. COMPARISON:  12/18/2014 FINDINGS: Perfusion: No decreased activity in the left ventricle on stress imaging to suggest reversible ischemia or infarction. Wall Motion: Normal left ventricular wall motion. No left ventricular dilation. Left Ventricular Ejection Fraction: 80 % End diastolic volume 50 ml End systolic volume 10 ml IMPRESSION: 1. No reversible ischemia or infarction. 2. Normal left ventricular wall motion. 3. Left ventricular ejection fraction 80%. 4. Low-risk stress test findings*. *2012 Appropriate Use Criteria for Coronary Revascularization Focused Update: J Am Coll Cardiol. [id number]. http://content.[email or web address].aspx?articleid=1201161 Electronically Signed   By: Julian Hy M.D.   On: 10/24/2015 16:58   TELE: SR   ASSESSMENT AND PLAN  Principal Problem:  CHEST PAIN- may be cardiac vs GERD with freq burping. She is on Prilosec. Will continue PPI, bring ---MD to see, ? Repeat nuc with neg troponins.   Active Problems:  HLD (hyperlipidemia) continue statin  GERD- protonix BID   Coronary artery disease- hx of stents  Atrial fibrillation, permanent on Eliquis  DM2 (diabetes mellitus, type 2) (HCC)  HTN (hypertension)  Chronic anticoagulation for A fib, CHA2DS2VASc = 6--Eliquis   A 82 year old female with known CAD, multiple interventions in the past who presented with chest pain with some typical and some atypical features. Improved with NTG, Troponin is negative x3, ECG is unchanged and shows SR, RBBB that is chronic.  Lexiscan negative for ischemia. BP is controlled. She is asymptomatic now, continue pantoprazole 40 mg po BID.  Signed, Dorothy Spark MD, [facility] 10/25/2015

## 2015-11-09 IMAGING — CR DG CHEST 2V
2 series · 2 of 2 positions shown · non-contrast
Comparison: 05/05/2014

CLINICAL DATA: All over chest pain, left arm pain, no SOB x 1
day.HX of HTN-on meds, DM- on meds, non smoker, Hx of having stents
put in many years ago

EXAM:
CHEST  2 VIEW

[chest pa]
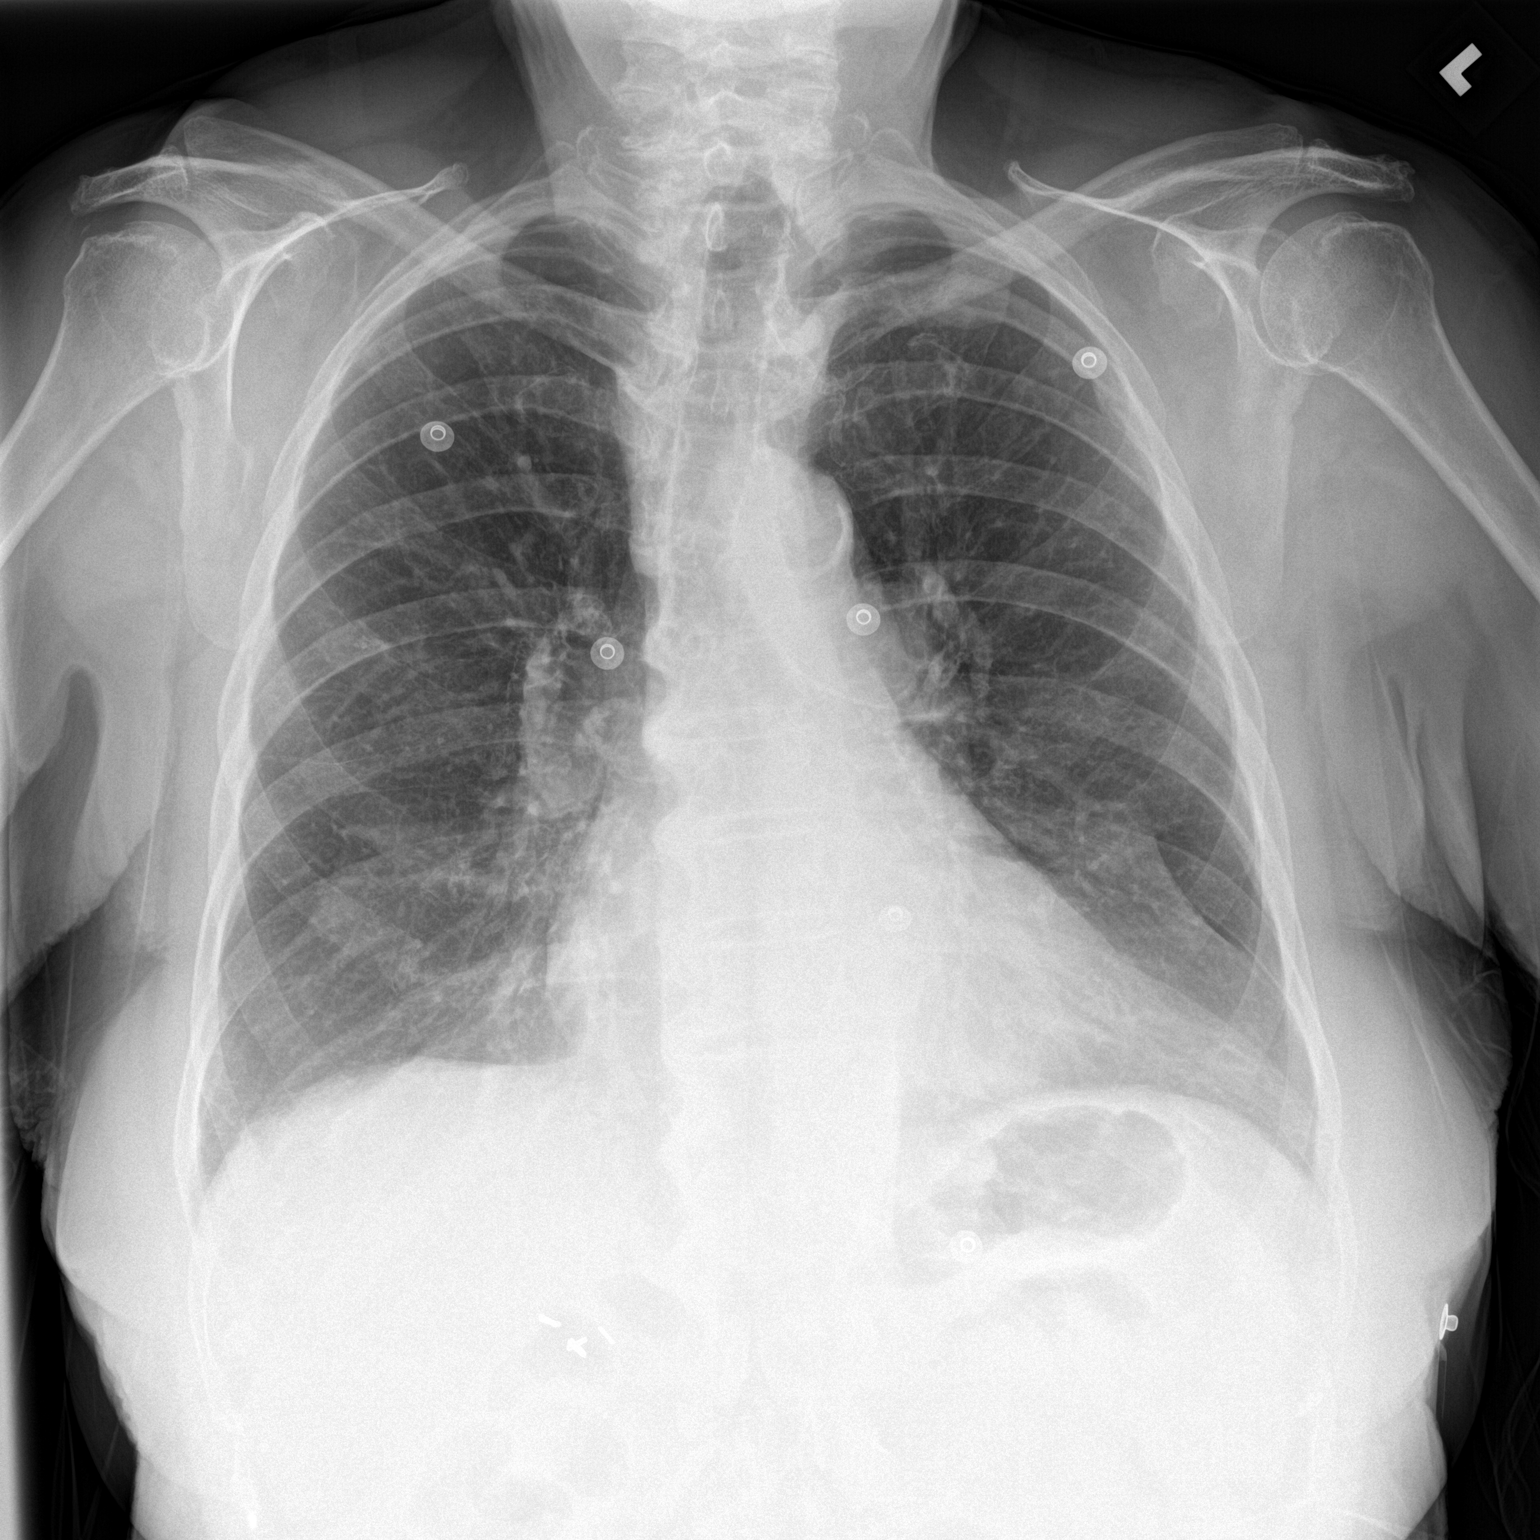

[chest lat]
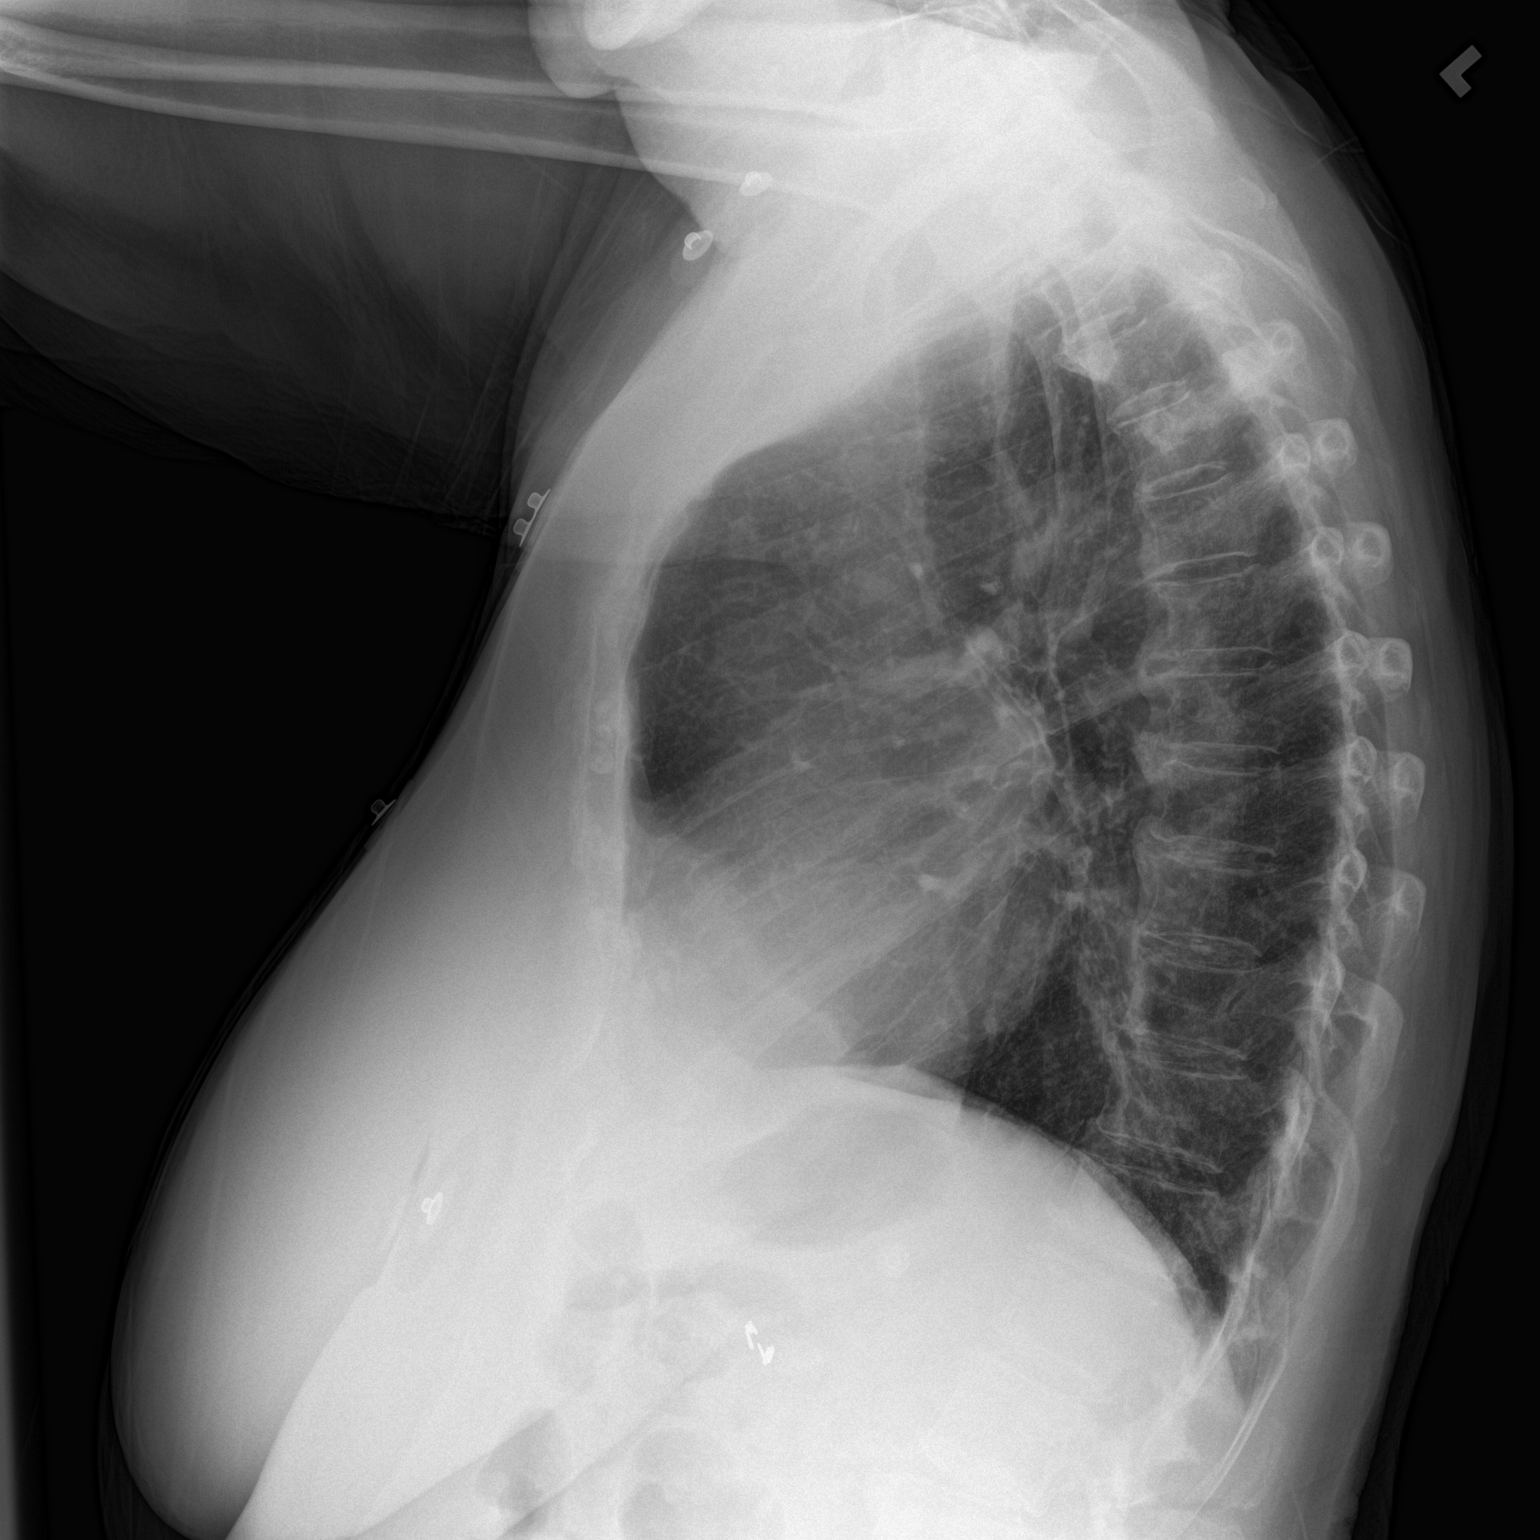

[2 of 2 positions shown; findings below may reference images not displayed]

FINDINGS: Cardiac silhouette is normal in size and configuration. Aorta is
mildly uncoiled. No mediastinal or hilar masses or evidence of
adenopathy.

Lungs are mildly hyperexpanded. There is mild prominence of the
interstitial markings in the lung bases. No lung consolidation or
edema. No pleural effusion or pneumothorax.

Bony thorax is demineralized but grossly intact.
IMPRESSION: No acute cardiopulmonary disease.

## 2015-11-16 ENCOUNTER — Ambulatory Visit (INDEPENDENT_AMBULATORY_CARE_PROVIDER_SITE_OTHER): Payer: Medicare Other | Admitting: Pharmacist

## 2015-11-16 VITALS — Wt 173.8 lb

## 2015-11-16 DIAGNOSIS — I4891 Unspecified atrial fibrillation: Secondary | ICD-10-CM | POA: Diagnosis not present

## 2015-11-16 LAB — CBC
HEMATOCRIT: 39.5 % (ref 35.0–45.0)
HEMOGLOBIN: 13.4 g/dL (ref 11.7–15.5)
MCH: 30.5 pg (ref 27.0–33.0)
MCHC: 33.9 g/dL (ref 32.0–36.0)
MCV: 89.8 fL (ref 80.0–100.0)
MPV: 9.9 fL (ref 7.5–12.5)
Platelets: 269 10*3/uL (ref 140–400)
RBC: 4.4 MIL/uL (ref 3.80–5.10)
RDW: 14.3 % (ref 11.0–15.0)
WBC: 9.3 10*3/uL (ref 3.8–10.8)

## 2015-11-16 LAB — BASIC METABOLIC PANEL
BUN: 21 mg/dL (ref 7–25)
CALCIUM: 9 mg/dL (ref 8.6–10.4)
CHLORIDE: 102 mmol/L (ref 98–110)
CO2: 27 mmol/L (ref 20–31)
Creat: 1 mg/dL — ABNORMAL HIGH (ref 0.60–0.88)
GLUCOSE: 150 mg/dL — AB (ref 65–99)
Potassium: 4.7 mmol/L (ref 3.5–5.3)
SODIUM: 139 mmol/L (ref 135–146)

## 2015-11-16 NOTE — Progress Notes (Signed)
Pt was started on Eliquis 5mg  BID by Dr. Harrington Challenger on 04/20/13. She currently gets samples from her primary care office - Dr. Melford Aase with [facility].  Reviewed patients medication list.  Pt not currently on any combined P-gp and strong CYP3A4 inhibitors/inducers (ketoconazole, traconazole, ritonavir, carbamazepine, phenytoin, rifampin, St. John's wort).  Reviewed labs. SCr 1, Weight 79 kg.  Dose appropriate based on weight, age, and SCr.  Hgb and HCT wnl at 13.4 and 39.5, respectively.  A full discussion of the nature of anticoagulants has been carried out.  A benefit/risk analysis has been presented to the patient, so that they understand the justification for choosing anticoagulation with Eliquis at this time.  The need for compliance is stressed.  Pt is aware to take the medication twice daily.  Side effects of potential bleeding are discussed, including unusual colored urine or stools, coughing up blood or coffee ground emesis, nose bleeds or serious fall or head trauma.  Discussed signs and symptoms of stroke. The patient should avoid any OTC items containing aspirin or ibuprofen.  Avoid alcohol consumption.  Call if any signs of abnormal bleeding.  Discussed financial obligations and resolved any difficulty in obtaining medication. Labs have been stable with q48month BMET and CBC checks for the past 3-4 years. Will have her follow up with future labs at annual cardiology visit. Pt is aware of lab results.

## 2015-11-18 ENCOUNTER — Ambulatory Visit (INDEPENDENT_AMBULATORY_CARE_PROVIDER_SITE_OTHER): Payer: Medicare Other | Admitting: Internal Medicine

## 2015-11-18 ENCOUNTER — Encounter: Payer: Self-pay | Admitting: Internal Medicine

## 2015-11-18 VITALS — BP 118/52 | HR 84 | Ht 64.0 in | Wt 170.4 lb

## 2015-11-18 DIAGNOSIS — I10 Essential (primary) hypertension: Secondary | ICD-10-CM

## 2015-11-18 DIAGNOSIS — E785 Hyperlipidemia, unspecified: Secondary | ICD-10-CM

## 2015-11-18 NOTE — Progress Notes (Signed)
Cardiology Office Note   Date:  11/18/2015   ID:  [name], DOB 12/12/1923, MRN [id number]  PCP:  Chesley Noon, MD  Cardiologist:   Dorris Carnes, MD    F/U of CAD     History of Present Illness: [name] is a 82 y.o. female with a history of  CAD, s/p prior Taxus DES to mLAD in 2005 and subsequent cutting balloon angioplasty due to ISR in 2006. Last LHC in 2/12 demonstrated LAD 40%, patent stent, prox and mid RCA 30% and EF 60%. Echo in 5/13: mild LVH, EF 55%, mild BAE, mild AI. She also has a h/o AFib, DM2, HTN, HLP, anemia, GERD, IBS, DJD and multinodular goiter. She is on Eliquis for anticoagulation.  In 2017 Leane Call was done which showed no ischemia   I saw her n JAn 2017    Breathing is OK  No CP  Chest feels a little tight in am  Gets every morning  Notes bitter taste in mouth  Tightness better as day goes on     Outpatient Prescriptions Prior to Visit  Medication Sig Dispense Refill  . amLODipine (NORVASC) 2.5 MG tablet Take 5 mg by mouth 2 (two) times daily.     Marland Kitchen apixaban (ELIQUIS) 5 MG TABS tablet Take 1 tablet (5 mg total) by mouth 2 (two) times daily.    Marland Kitchen aspirin 81 MG EC tablet Take 81 mg by mouth daily.      . Calcium Carbonate-Vit D-Min (CALCIUM 1200 PO) Take 1 tablet by mouth 2 (two) times daily.     Marland Kitchen escitalopram (LEXAPRO) 10 MG tablet Take 10 mg by mouth daily.    Marland Kitchen gabapentin (NEURONTIN) 600 MG tablet Take 600 mg by mouth 2 (two) times daily.     Marland Kitchen glipiZIDE (GLUCOTROL XL) 5 MG 24 hr tablet Take 5 mg by mouth daily with breakfast.    . Multiple Vitamins-Minerals (CENTRUM SILVER PO) Take 1 tablet by mouth daily.     Marland Kitchen oxybutynin (DITROPAN) 5 MG tablet Take 5 mg by mouth daily.    . pantoprazole (PROTONIX) 40 MG tablet Take 1 tablet (40 mg total) by mouth 2 (two) times daily. 60 tablet 11  . rosuvastatin (CRESTOR) 5 MG tablet Take 5 mg by mouth at bedtime.    . Multiple Vitamins-Minerals (PRESERVISION AREDS 2 PO) Take 2  tablets by mouth daily. Reported on 11/18/2015    . polyethylene glycol powder (GLYCOLAX/MIRALAX) powder Take 17 g by mouth daily as needed for mild constipation. Reported on 11/18/2015     No facility-administered medications prior to visit.     Allergies:   Amoxicillin   Past Medical History  Diagnosis Date  . Allergic rhinitis   . Anemia     NOS chronic disease. Hgb 11.6gm% 04/14/2009  . CAD (coronary artery disease)     a. s/p DES mLAD 2005. b. Cutting balloon angioplasty for ISR 2006. c. LHC 08/2010 nonobstructive. d. Myoview 06/2012: small anteroapical infarct/mod inferolat wall infarct but no ischemia, EF 75%.  . Diverticulitis of colon   . GERD (gastroesophageal reflux disease)   . IBS (irritable bowel syndrome)   . Spinal stenosis     djd lumbar  . Multinodular goiter   . Orthostatic hypotension   . Obesity     BMI-37  . Acute renal failure ([place])     due to dehydration-Sept 2009; Normal creat 1.2 on fu 05/13/2009  . Hx of colonoscopy   . Cancer ([place])  on nose and had it removed  . DEMENTIA     slight  . Pneumonia   . PAF (paroxysmal atrial fibrillation) (H[facility])     noted on event monitor. No correlation with episodes and symptoms  . Chronic anticoagulation   . RBBB   . 1St degree AV block   . Diabetes mellitus type II   . DM neuropathy, type II diabetes mellitus ([place])   . Hyperlipidemia   . HTN (hypertension)   . Osteoarthritis     lower back  . Rheumatoid arthritis ([place])   . Osteoporosis     Past Surgical History  Procedure Laterality Date  . Cholecystectomy    . Abdominal hysterectomy    . Nasal sinus surgery      x2  . Bilateral arthroscopic knee surgery    . Stent surgery    . Coronary angioplasty      stent  . Carpel tunnel wrist rt       Social History:  The patient  reports that she has never smoked. She has never used smokeless tobacco. She reports that she does not drink alcohol or use illicit drugs.   Family History:  The patient's  family history includes Breast cancer in her sister; Heart attack in her daughter, father, and son; Heart disease in her brother. There is no history of Colon cancer or Stroke.    ROS:  Please see the history of present illness. All other systems are reviewed and  Negative to the above problem except as noted.    PHYSICAL EXAM: VS:  BP 118/52 mmHg  Pulse 84  Ht 5\' 4"  (1.626 m)  Wt 170 lb 6.4 oz (77.293 kg)  BMI 29.23 kg/m2  SpO2 98%  GEN: Well nourished, well developed, in no acute distress HEENT: normal Neck: no JVD, carotid bruits, or masses Cardiac: RRR; no murmurs, rubs, or gallops,Tr to 1+ edema  Respiratory:  clear to auscultation bilaterally, normal work of breathing GI: soft, nontender, nondistended, + BS  No hepatomegaly  MS: no deformity Moving all extremities   Skin: warm and dry, no rash Neuro:  Strength and sensation are intact Psych: euthymic mood, full affect   EKG:  EKG is not  ordered today.   Lipid Panel    Component Value Date/Time   CHOL 145 10/25/2015 0329   TRIG 84 10/25/2015 0329   HDL 52 10/25/2015 0329   CHOLHDL 2.8 10/25/2015 0329   VLDL 17 10/25/2015 0329   LDLCALC 76 10/25/2015 0329      Wt Readings from Last 3 Encounters:  11/18/15 170 lb 6.4 oz (77.293 kg)  11/16/15 173 lb 12.8 oz (78.835 kg)  10/25/15 171 lb 11.2 oz (77.883 kg)      ASSESSMENT AND PLAN:  1  CAD  Doing good  I am not convinced of active angina  AM symptoms most likely due to breakthrough reflux   I told her to try taking acid inhibitor at night  2.  AFib PAF  Keep on anticoag  3.  HTN  Good control  4  HL  Good control  5  Carpal tunnel  Pt is low risk for cardiac complication  OK to proceed  Hold Eliqis prior    I will see in 6 months.      Signed, Dorris Carnes, MD  11/18/2015 11:24 AM    [address], [place], [place]  [address] Phone: [phone]; Fax: [phone]

## 2015-11-18 NOTE — Patient Instructions (Signed)
Your physician recommends that you continue on your current medications as directed. Please refer to the Current Medication list given to you today. Your physician wants you to follow-up in: 6 months with Dr. Ross.  You will receive a reminder letter in the mail two months in advance. If you don't receive a letter, please call our office to schedule the follow-up appointment.  

## 2015-12-05 ENCOUNTER — Telehealth: Payer: Self-pay | Admitting: Internal Medicine

## 2015-12-05 NOTE — Telephone Encounter (Signed)
Left very detailed message for [name] at [name] that I sent the last OV note to Dr. Caralyn Shepherd.    ASSESSMENT AND PLAN:  1 CAD Doing good I am not convinced of active angina AM symptoms most likely due to breakthrough reflux I told her to try taking acid inhibitor at night  2. AFib PAF Keep on anticoag  3. HTN Good control  4 HL Good control  5 Carpal tunnel Pt is low risk for cardiac complication OK to proceed Hold Eliqis prior    ADVISED [name] TO CALL BACK IF NEEDED.

## 2015-12-05 NOTE — Telephone Encounter (Signed)
New message      Request for surgical clearance:  1. What type of surgery is being performed? Carpel tunnel release  When is this surgery scheduled? 12-07-15 Are there any medications that need to be held prior to surgery and how long?stop eliquis and asa 2. Name of physician performing surgery? Dr Caralyn Guile  3. What is your office phone and fax number? [phone]  4. They have sent several faxes and have not heard back from us----please call today or surgery will have to be cancelled

## 2015-12-07 ENCOUNTER — Other Ambulatory Visit: Payer: Self-pay | Admitting: Nurse Practitioner

## 2015-12-07 DIAGNOSIS — N644 Mastodynia: Secondary | ICD-10-CM

## 2015-12-15 ENCOUNTER — Other Ambulatory Visit: Payer: Self-pay | Admitting: Nurse Practitioner

## 2015-12-15 DIAGNOSIS — N644 Mastodynia: Secondary | ICD-10-CM

## 2015-12-22 ENCOUNTER — Other Ambulatory Visit: Payer: Medicare Other

## 2015-12-30 ENCOUNTER — Encounter (HOSPITAL_COMMUNITY): Payer: Self-pay

## 2015-12-30 ENCOUNTER — Emergency Department (HOSPITAL_COMMUNITY): Payer: Medicare Other

## 2015-12-30 ENCOUNTER — Emergency Department (HOSPITAL_COMMUNITY)
Admission: EM | Admit: 2015-12-30 | Discharge: 2015-12-30 | Disposition: A | Discharge status: Home / Self Care | Payer: Medicare Other | Attending: Emergency Medicine | Admitting: Emergency Medicine

## 2015-12-30 DIAGNOSIS — K219 Gastro-esophageal reflux disease without esophagitis: Secondary | ICD-10-CM | POA: Diagnosis not present

## 2015-12-30 DIAGNOSIS — Z85828 Personal history of other malignant neoplasm of skin: Secondary | ICD-10-CM | POA: Insufficient documentation

## 2015-12-30 DIAGNOSIS — Z862 Personal history of diseases of the blood and blood-forming organs and certain disorders involving the immune mechanism: Secondary | ICD-10-CM | POA: Insufficient documentation

## 2015-12-30 DIAGNOSIS — Z7982 Long term (current) use of aspirin: Secondary | ICD-10-CM | POA: Diagnosis not present

## 2015-12-30 DIAGNOSIS — Z8701 Personal history of pneumonia (recurrent): Secondary | ICD-10-CM | POA: Diagnosis not present

## 2015-12-30 DIAGNOSIS — M47896 Other spondylosis, lumbar region: Secondary | ICD-10-CM | POA: Diagnosis not present

## 2015-12-30 DIAGNOSIS — Z90710 Acquired absence of both cervix and uterus: Secondary | ICD-10-CM | POA: Insufficient documentation

## 2015-12-30 DIAGNOSIS — Z9861 Coronary angioplasty status: Secondary | ICD-10-CM | POA: Diagnosis not present

## 2015-12-30 DIAGNOSIS — E669 Obesity, unspecified: Secondary | ICD-10-CM | POA: Insufficient documentation

## 2015-12-30 DIAGNOSIS — E785 Hyperlipidemia, unspecified: Secondary | ICD-10-CM | POA: Diagnosis not present

## 2015-12-30 DIAGNOSIS — R197 Diarrhea, unspecified: Secondary | ICD-10-CM | POA: Insufficient documentation

## 2015-12-30 DIAGNOSIS — I10 Essential (primary) hypertension: Secondary | ICD-10-CM | POA: Insufficient documentation

## 2015-12-30 DIAGNOSIS — Z7901 Long term (current) use of anticoagulants: Secondary | ICD-10-CM | POA: Insufficient documentation

## 2015-12-30 DIAGNOSIS — E1140 Type 2 diabetes mellitus with diabetic neuropathy, unspecified: Secondary | ICD-10-CM | POA: Insufficient documentation

## 2015-12-30 DIAGNOSIS — Z9049 Acquired absence of other specified parts of digestive tract: Secondary | ICD-10-CM | POA: Diagnosis not present

## 2015-12-30 DIAGNOSIS — F0390 Unspecified dementia without behavioral disturbance: Secondary | ICD-10-CM | POA: Insufficient documentation

## 2015-12-30 DIAGNOSIS — R109 Unspecified abdominal pain: Secondary | ICD-10-CM | POA: Diagnosis not present

## 2015-12-30 DIAGNOSIS — I2510 Atherosclerotic heart disease of native coronary artery without angina pectoris: Secondary | ICD-10-CM | POA: Diagnosis not present

## 2015-12-30 DIAGNOSIS — Z7984 Long term (current) use of oral hypoglycemic drugs: Secondary | ICD-10-CM | POA: Diagnosis not present

## 2015-12-30 DIAGNOSIS — R112 Nausea with vomiting, unspecified: Secondary | ICD-10-CM | POA: Diagnosis not present

## 2015-12-30 DIAGNOSIS — M069 Rheumatoid arthritis, unspecified: Secondary | ICD-10-CM | POA: Insufficient documentation

## 2015-12-30 DIAGNOSIS — Z880 Allergy status to penicillin: Secondary | ICD-10-CM | POA: Diagnosis not present

## 2015-12-30 LAB — COMPREHENSIVE METABOLIC PANEL
ALBUMIN: 3.9 g/dL (ref 3.5–5.0)
ALT: 24 U/L (ref 14–54)
AST: 33 U/L (ref 15–41)
Alkaline Phosphatase: 45 U/L (ref 38–126)
Anion gap: 10 (ref 5–15)
BILIRUBIN TOTAL: 0.8 mg/dL (ref 0.3–1.2)
BUN: 14 mg/dL (ref 6–20)
CHLORIDE: 95 mmol/L — AB (ref 101–111)
CO2: 23 mmol/L (ref 22–32)
CREATININE: 1.08 mg/dL — AB (ref 0.44–1.00)
Calcium: 9.6 mg/dL (ref 8.9–10.3)
GFR calc Af Amer: 51 mL/min — ABNORMAL LOW (ref >60)
GFR, EST NON AFRICAN AMERICAN: 44 mL/min — AB (ref >60)
GLUCOSE: 160 mg/dL — AB (ref 65–99)
POTASSIUM: 4.6 mmol/L (ref 3.5–5.1)
Sodium: 128 mmol/L — ABNORMAL LOW (ref 135–145)
Total Protein: 6.4 g/dL — ABNORMAL LOW (ref 6.5–8.1)

## 2015-12-30 LAB — CBC
HEMATOCRIT: 40.3 % (ref 36.0–46.0)
Hemoglobin: 13.7 g/dL (ref 12.0–15.0)
MCH: 30.6 pg (ref 26.0–34.0)
MCHC: 34 g/dL (ref 30.0–36.0)
MCV: 90.2 fL (ref 78.0–100.0)
PLATELETS: 201 10*3/uL (ref 150–400)
RBC: 4.47 MIL/uL (ref 3.87–5.11)
RDW: 14.5 % (ref 11.5–15.5)
WBC: 9.6 10*3/uL (ref 4.0–10.5)

## 2015-12-30 LAB — URINALYSIS, ROUTINE W REFLEX MICROSCOPIC
BILIRUBIN URINE: NEGATIVE
GLUCOSE, UA: NEGATIVE mg/dL
HGB URINE DIPSTICK: NEGATIVE
KETONES UR: NEGATIVE mg/dL
Leukocytes, UA: NEGATIVE
Nitrite: NEGATIVE
PH: 7 (ref 5.0–8.0)
PROTEIN: NEGATIVE mg/dL
Specific Gravity, Urine: 1.008 (ref 1.005–1.030)

## 2015-12-30 LAB — LIPASE, BLOOD: LIPASE: 109 U/L — AB (ref 11–51)

## 2015-12-30 MED ORDER — SODIUM CHLORIDE 0.9 % IV BOLUS (SEPSIS)
500.0000 mL | Freq: Once | INTRAVENOUS | Status: AC
  Administered 2015-12-30: 500 mL via INTRAVENOUS

## 2015-12-30 MED ORDER — ONDANSETRON 4 MG PO TBDP
ORAL_TABLET | ORAL | Status: AC
  Filled 2015-12-30: qty 1

## 2015-12-30 MED ORDER — ONDANSETRON 4 MG PO TBDP
4.0000 mg | ORAL_TABLET | Freq: Once | ORAL | Status: AC | PRN
  Administered 2015-12-30: 4 mg via ORAL

## 2015-12-30 MED ORDER — ONDANSETRON HCL 8 MG PO TABS: 8.0000 mg | ORAL_TABLET | Freq: Three times a day (TID) | ORAL | Status: DC | PRN

## 2015-12-30 MED ORDER — IOPAMIDOL (ISOVUE-300) INJECTION 61%
INTRAVENOUS | Status: AC
  Administered 2015-12-30: 100 mL
  Filled 2015-12-30: qty 100

## 2015-12-30 NOTE — ED Notes (Signed)
Pt complaining of nausea vomiting and diarrhea x 2 days. Denies any pain or SOB.

## 2015-12-30 NOTE — ED Provider Notes (Signed)
CSN: [id number]     Arrival date & time 12/30/15  1612 History   First MD Initiated Contact with Patient 12/30/15 1726     Chief Complaint  Patient presents with  . Nausea  . Emesis     (Consider location/radiation/quality/duration/timing/severity/associated sxs/prior Treatment) HPI   [name] is a 82 y.o. female here for evaluation of nausea, vomiting and diarrhea, for 2 days.today, she has been able to tolerate some oral liquids, but not any food.Earlier, she took some Tums for nausea but it didn't help.here's been no blood in her emesis or stool. She has had some diarrhea, but none today. Her sister-in-law has a similar problem and was recently admitted to the hospital. Patient denies fever, chills, chest pain, weakness or dizziness. She drove her car today to her doctor's office, who evaluated her, felt she was dehydrated and called a family member to bring her here. There are no other known modifying factors.   Past Medical History  Diagnosis Date  . Allergic rhinitis   . Anemia     NOS chronic disease. Hgb 11.6gm% 04/14/2009  . CAD (coronary artery disease)     a. s/p DES mLAD 2005. b. Cutting balloon angioplasty for ISR 2006. c. LHC 08/2010 nonobstructive. d. Myoview 06/2012: small anteroapical infarct/mod inferolat wall infarct but no ischemia, EF 75%.  . Diverticulitis of colon   . GERD (gastroesophageal reflux disease)   . IBS (irritable bowel syndrome)   . Spinal stenosis     djd lumbar  . Multinodular goiter   . Orthostatic hypotension   . Obesity     BMI-37  . Acute renal failure ([place])     due to dehydration-Sept 2009; Normal creat 1.2 on fu 05/13/2009  . Hx of colonoscopy   . Cancer ([place])     on nose and had it removed  . DEMENTIA     slight  . Pneumonia   . PAF (paroxysmal atrial fibrillation) (H[facility])     noted on event monitor. No correlation with episodes and symptoms  . Chronic anticoagulation   . RBBB   . 1St degree AV block   . Diabetes mellitus  type II   . DM neuropathy, type II diabetes mellitus ([place])   . Hyperlipidemia   . HTN (hypertension)   . Osteoarthritis     lower back  . Rheumatoid arthritis ([place])   . Osteoporosis    Past Surgical History  Procedure Laterality Date  . Cholecystectomy    . Abdominal hysterectomy    . Nasal sinus surgery      x2  . Bilateral arthroscopic knee surgery    . Stent surgery    . Coronary angioplasty      stent  . Carpel tunnel wrist rt     Family History  Problem Relation Age of Onset  . Breast cancer Sister   . Heart disease Brother   . Colon cancer Neg Hx   . Heart attack Son   . Heart attack Daughter   . Heart attack Father   . Stroke Neg Hx    Social History  Substance Use Topics  . Smoking status: Never Smoker   . Smokeless tobacco: Never Used  . Alcohol Use: No   OB History    No data available     Review of Systems  All other systems reviewed and are negative.     Allergies  Amoxicillin  Home Medications   Prior to Admission medications   Medication Sig Start  Date End Date Taking? Authorizing Provider  amLODipine (NORVASC) 2.5 MG tablet Take 5 mg by mouth daily.    Yes Historical Provider, MD  apixaban (ELIQUIS) 5 MG TABS tablet Take 1 tablet (5 mg total) by mouth 2 (two) times daily. 01/31/15  Yes Liliane Shi, PA-C  aspirin 81 MG EC tablet Take 81 mg by mouth daily.     Yes Historical Provider, MD  Calcium Carbonate-Vit D-Min (CALCIUM 1200 PO) Take 1 tablet by mouth daily.    Yes Historical Provider, MD  gabapentin (NEURONTIN) 600 MG tablet Take 600 mg by mouth 2 (two) times daily.  10/25/13  Yes Historical Provider, MD  glipiZIDE (GLUCOTROL XL) 5 MG 24 hr tablet Take 5 mg by mouth daily with breakfast.   Yes Historical Provider, MD  Multiple Vitamins-Minerals (CENTRUM SILVER PO) Take 1 tablet by mouth daily.    Yes Historical Provider, MD  Multiple Vitamins-Minerals (PRESERVISION AREDS PO) Take 1 tablet by mouth 2 (two) times daily.   Yes Historical  Provider, MD  oxybutynin (DITROPAN) 5 MG tablet Take 5 mg by mouth daily.   Yes Historical Provider, MD  rosuvastatin (CRESTOR) 10 MG tablet Take 10 mg by mouth daily.   Yes Historical Provider, MD  rosuvastatin (CRESTOR) 5 MG tablet Take 5 mg by mouth at bedtime.   Yes Historical Provider, MD  ondansetron (ZOFRAN) 8 MG tablet Take 1 tablet (8 mg total) by mouth every 8 (eight) hours as needed for nausea or vomiting. 12/30/15   Daleen Bo, MD  pantoprazole (PROTONIX) 40 MG tablet Take 1 tablet (40 mg total) by mouth 2 (two) times daily. Patient not taking: Reported on 12/30/2015 10/25/15   Evelene Croon Barrett, PA-C   BP 153/88 mmHg  Pulse 63  Resp 20  SpO2 100% Physical Exam  Constitutional: She is oriented to person, place, and time. She appears well-developed. No distress.  Elderly, frail  HENT:  Head: Normocephalic and atraumatic.  Right Ear: External ear normal.  Left Ear: External ear normal.  Eyes: Conjunctivae and EOM are normal. Pupils are equal, round, and reactive to light.  Neck: Normal range of motion and phonation normal. Neck supple.  Cardiovascular: Normal rate, regular rhythm and normal heart sounds.   Pulmonary/Chest: Effort normal and breath sounds normal. No respiratory distress. She has no rales. She exhibits no bony tenderness.  Abdominal: Soft. She exhibits no distension. There is no tenderness. There is no guarding.  Musculoskeletal: Normal range of motion.  Neurological: She is alert and oriented to person, place, and time. No cranial nerve deficit or sensory deficit. She exhibits normal muscle tone. Coordination normal.  Skin: Skin is warm, dry and intact.  Psychiatric: She has a normal mood and affect. Her behavior is normal. Judgment and thought content normal.  Nursing note and vitals reviewed.   ED Course  Procedures (including critical care time)  Medications  ondansetron (ZOFRAN-ODT) disintegrating tablet 4 mg (4 mg Oral Given 12/30/15 1621)  sodium chloride  0.9 % bolus 500 mL (0 mLs Intravenous Stopped 12/30/15 2134)  iopamidol (ISOVUE-300) 61 % injection (100 mLs  Contrast Given 12/30/15 1911)    Patient Vitals for the past 24 hrs:  BP Pulse Resp SpO2  12/30/15 2133 153/88 mmHg 63 - 100 %  12/30/15 2000 136/79 mmHg - - -  12/30/15 1621 147/71 mmHg 86 20 98 %    At discharge- Reevaluation with update and discussion. After initial assessment and treatment, an updated evaluation reveals tolerating oral fluids. Findings discussed  with patient, and all questions answered. [place] Review Labs Reviewed  LIPASE, BLOOD - Abnormal; Notable for the following:    Lipase 109 (*)    All other components within normal limits  COMPREHENSIVE METABOLIC PANEL - Abnormal; Notable for the following:    Sodium 128 (*)    Chloride 95 (*)    Glucose, Bld 160 (*)    Creatinine, Ser 1.08 (*)    Total Protein 6.4 (*)    GFR calc non Af Amer 44 (*)    GFR calc Af Amer 51 (*)    All other components within normal limits  CBC  URINALYSIS, ROUTINE W REFLEX MICROSCOPIC (NOT AT [facility])    Imaging Review Ct Abdomen Pelvis W Contrast  12/30/2015  CLINICAL DATA:  Right lower quadrant pain for 2 days. Acute renal failure. Diabetes. Hypertension. Cholecystectomy. Hysterectomy. EXAM: CT ABDOMEN AND PELVIS WITH CONTRAST TECHNIQUE: Multidetector CT imaging of the abdomen and pelvis was performed using the standard protocol following bolus administration of intravenous contrast. CONTRAST:  154mL ISOVUE-300 IOPAMIDOL (ISOVUE-300) INJECTION 61% COMPARISON:  11/20/2011. FINDINGS: Lower chest: Clear lung bases. Mild cardiomegaly with multivessel coronary artery atherosclerosis. Small hiatal hernia. Hepatobiliary: Normal liver. Cholecystectomy, without biliary ductal dilatation. Pancreas: Normal, without mass or ductal dilatation. Spleen: Normal in size, without focal abnormality. Adrenals/Urinary Tract: Normal adrenal glands. Normal kidneys, without hydronephrosis.  Normal urinary bladder. Stomach/Bowel: Normal remainder of the stomach. Extensive colonic diverticulosis. Normal colon, appendix, and terminal ileum. Normal small bowel. Vascular/Lymphatic: Aortic and branch vessel atherosclerosis. No abdominopelvic adenopathy. Reproductive: Hysterectomy.  No adnexal mass. Other: No significant free fluid. Musculoskeletal: Lumbar spondylosis without focal osseous lesion. IMPRESSION: 1.  No acute process or explanation for right lower quadrant pain. 2.  Atherosclerosis, including within the coronary arteries. 3. Small hiatal hernia. Electronically Signed   By: Abigail Miyamoto M.D.   On: 12/30/2015 19:39   I have personally reviewed and evaluated these images and lab results as part of my medical decision-making.   EKG Interpretation None      MDM   Final diagnoses:  Nausea vomiting and diarrhea    Nonspecific abdominal pain, with reassuring CT evaluation. Mild elevation of lipase is nonspecific. Patient is a nondrinker. She has had lipase elevation in the past in 2013.Doubt serious bacterial infection. Metabolic instability or impending vascular collapse.  Nursing Notes Reviewed/ Care Coordinated Applicable Imaging Reviewed Interpretation of Laboratory Data incorporated into ED treatment  The patient appears reasonably screened and/or stabilized for discharge and I doubt any other medical condition or other [facility] requiring further screening, evaluation, or treatment in the ED at this time prior to discharge.  Plan: Home Medications- Zofran; Home Treatments- rest; return here if the recommended treatment, does not improve the symptoms; Recommended follow up- PCP prn     Daleen Bo, MD 12/31/15 0022

## 2015-12-30 NOTE — ED Notes (Signed)
Coming from PCPs office-states N/V for a couple of days-unable to keep POs down-possible dehydration

## 2015-12-30 NOTE — Discharge Instructions (Signed)
Start with clear liquids, then gradually advance your diet over the next 2 or 3 days. Use the medicine for nausea as directed. See your doctor if not better in 3 or 4 days.    Nausea and Vomiting Nausea is a sick feeling that often comes before throwing up (vomiting). Vomiting is a reflex where stomach contents come out of your mouth. Vomiting can cause severe loss of body fluids (dehydration). Children and elderly adults can become dehydrated quickly, especially if they also have diarrhea. Nausea and vomiting are symptoms of a condition or disease. It is important to find the cause of your symptoms. CAUSES   Direct irritation of the stomach lining. This irritation can result from increased acid production (gastroesophageal reflux disease), infection, food poisoning, taking certain medicines (such as nonsteroidal anti-inflammatory drugs), alcohol use, or tobacco use.  Signals from the brain.These signals could be caused by a headache, heat exposure, an inner ear disturbance, increased pressure in the brain from injury, infection, a tumor, or a concussion, pain, emotional stimulus, or metabolic problems.  An obstruction in the gastrointestinal tract (bowel obstruction).  Illnesses such as diabetes, hepatitis, gallbladder problems, appendicitis, kidney problems, cancer, sepsis, atypical symptoms of a heart attack, or eating disorders.  Medical treatments such as chemotherapy and radiation.  Receiving medicine that makes you sleep (general anesthetic) during surgery. DIAGNOSIS Your caregiver may ask for tests to be done if the problems do not improve after a few days. Tests may also be done if symptoms are severe or if the reason for the nausea and vomiting is not clear. Tests may include:  Urine tests.  Blood tests.  Stool tests.  Cultures (to look for evidence of infection).  X-rays or other imaging studies. Test results can help your caregiver make decisions about treatment or the  need for additional tests. TREATMENT You need to stay well hydrated. Drink frequently but in small amounts.You may wish to drink water, sports drinks, clear broth, or eat frozen ice pops or gelatin dessert to help stay hydrated.When you eat, eating slowly may help prevent nausea.There are also some antinausea medicines that may help prevent nausea. HOME CARE INSTRUCTIONS   Take all medicine as directed by your caregiver.  If you do not have an appetite, do not force yourself to eat. However, you must continue to drink fluids.  If you have an appetite, eat a normal diet unless your caregiver tells you differently.  Eat a variety of complex carbohydrates (rice, wheat, potatoes, bread), lean meats, yogurt, fruits, and vegetables.  Avoid high-fat foods because they are more difficult to digest.  Drink enough water and fluids to keep your urine clear or pale yellow.  If you are dehydrated, ask your caregiver for specific rehydration instructions. Signs of dehydration may include:  Severe thirst.  Dry lips and mouth.  Dizziness.  Dark urine.  Decreasing urine frequency and amount.  Confusion.  Rapid breathing or pulse. SEEK IMMEDIATE MEDICAL CARE IF:   You have blood or brown flecks (like coffee grounds) in your vomit.  You have black or bloody stools.  You have a severe headache or stiff neck.  You are confused.  You have severe abdominal pain.  You have chest pain or trouble breathing.  You do not urinate at least once every 8 hours.  You develop cold or clammy skin.  You continue to vomit for longer than 24 to 48 hours.  You have a fever. MAKE SURE YOU:   Understand these instructions.  Will watch  your condition.  Will get help right away if you are not doing well or get worse.   This information is not intended to replace advice given to you by your health care provider. Make sure you discuss any questions you have with your health care provider.     Document Released: 07/09/2005 Document Revised: 10/01/2011 Document Reviewed: 12/06/2010 Elsevier Interactive Patient Education 2016 [place] Choices to Help Relieve Diarrhea, Adult When you have diarrhea, the foods you eat and your eating habits are very important. Choosing the right foods and drinks can help relieve diarrhea. Also, because diarrhea can last up to 7 days, you need to replace lost fluids and electrolytes (such as sodium, potassium, and chloride) in order to help prevent dehydration.  WHAT GENERAL GUIDELINES DO I NEED TO FOLLOW?  Slowly drink 1 cup (8 oz) of fluid for each episode of diarrhea. If you are getting enough fluid, your urine will be clear or pale yellow.  Eat starchy foods. Some good choices include white rice, white toast, pasta, low-fiber cereal, baked potatoes (without the skin), saltine crackers, and bagels.  Avoid large servings of any cooked vegetables.  Limit fruit to two servings per day. A serving is  cup or 1 small piece.  Choose foods with less than 2 g of fiber per serving.  Limit fats to less than 8 tsp (38 g) per day.  Avoid fried foods.  Eat foods that have probiotics in them. Probiotics can be found in certain dairy products.  Avoid foods and beverages that may increase the speed at which food moves through the stomach and intestines (gastrointestinal tract). Things to avoid include:  High-fiber foods, such as dried fruit, raw fruits and vegetables, nuts, seeds, and whole grain foods.  Spicy foods and high-fat foods.  Foods and beverages sweetened with high-fructose corn syrup, honey, or sugar alcohols such as xylitol, sorbitol, and mannitol. WHAT FOODS ARE RECOMMENDED? Grains White rice. White, [place], or pita breads (fresh or toasted), including plain rolls, buns, or bagels. White pasta. Saltine, soda, or graham crackers. Pretzels. Low-fiber cereal. Cooked cereals made with water (such as cornmeal, farina, or cream cereals).  Plain muffins. Matzo. Melba toast. Zwieback.  Vegetables Potatoes (without the skin). Strained tomato and vegetable juices. Most well-cooked and canned vegetables without seeds. Tender lettuce. Fruits Cooked or canned applesauce, apricots, cherries, fruit cocktail, grapefruit, peaches, pears, or plums. Fresh bananas, apples without skin, cherries, grapes, cantaloupe, grapefruit, peaches, oranges, or plums.  Meat and Other Protein Products Baked or boiled chicken. Eggs. Tofu. Fish. Seafood. Smooth peanut butter. Ground or well-cooked tender beef, ham, veal, lamb, pork, or poultry.  Dairy Plain yogurt, kefir, and unsweetened liquid yogurt. Lactose-free milk, buttermilk, or soy milk. Plain hard cheese. Beverages Sport drinks. Clear broths. Diluted fruit juices (except prune). Regular, caffeine-free sodas such as ginger ale. Water. Decaffeinated teas. Oral rehydration solutions. Sugar-free beverages not sweetened with sugar alcohols. Other Bouillon, broth, or soups made from recommended foods.  The items listed above may not be a complete list of recommended foods or beverages. Contact your dietitian for more options. WHAT FOODS ARE NOT RECOMMENDED? Grains Whole grain, whole wheat, bran, or rye breads, rolls, pastas, crackers, and cereals. Wild or brown rice. Cereals that contain more than 2 g of fiber per serving. Corn tortillas or taco shells. Cooked or dry oatmeal. Granola. Popcorn. Vegetables Raw vegetables. Cabbage, broccoli, Brussels sprouts, artichokes, baked beans, beet greens, corn, kale, legumes, peas, sweet potatoes, and yams. Potato skins. Cooked spinach  and cabbage. Fruits Dried fruit, including raisins and dates. Raw fruits. Stewed or dried prunes. Fresh apples with skin, apricots, mangoes, pears, raspberries, and strawberries.  Meat and Other Protein Products Chunky peanut butter. Nuts and seeds. Beans and lentils. Berniece Salines.  Dairy High-fat cheeses. Milk, chocolate milk, and beverages  made with milk, such as milk shakes. Cream. Ice cream. Sweets and Desserts Sweet rolls, doughnuts, and sweet breads. Pancakes and waffles. Fats and Oils Butter. Cream sauces. Margarine. Salad oils. Plain salad dressings. Olives. Avocados.  Beverages Caffeinated beverages (such as coffee, tea, soda, or energy drinks). Alcoholic beverages. Fruit juices with pulp. Prune juice. Soft drinks sweetened with high-fructose corn syrup or sugar alcohols. Other Coconut. Hot sauce. Chili powder. Mayonnaise. Gravy. Cream-based or milk-based soups.  The items listed above may not be a complete list of foods and beverages to avoid. Contact your dietitian for more information. WHAT SHOULD I DO IF I BECOME DEHYDRATED? Diarrhea can sometimes lead to dehydration. Signs of dehydration include dark urine and dry mouth and skin. If you think you are dehydrated, you should rehydrate with an oral rehydration solution. These solutions can be purchased at pharmacies, retail stores, or online.  Drink -1 cup (120-240 mL) of oral rehydration solution each time you have an episode of diarrhea. If drinking this amount makes your diarrhea worse, try drinking smaller amounts more often. For example, drink 1-3 tsp (5-15 mL) every 5-10 minutes.  A general rule for staying hydrated is to drink 1-2 L of fluid per day. Talk to your health care provider about the specific amount you should be drinking each day. Drink enough fluids to keep your urine clear or pale yellow.   This information is not intended to replace advice given to you by your health care provider. Make sure you discuss any questions you have with your health care provider.   Document Released: 09/29/2003 Document Revised: 07/30/2014 Document Reviewed: 06/01/2013 Elsevier Interactive Patient Education 2016 [place].  Diarrhea Diarrhea is frequent loose and watery bowel movements. It can cause you to feel weak and dehydrated. Dehydration can cause you to become  tired and thirsty, have a dry mouth, and have decreased urination that often is dark yellow. Diarrhea is a sign of another problem, most often an infection that will not last long. In most cases, diarrhea typically lasts 2-3 days. However, it can last longer if it is a sign of something more serious. It is important to treat your diarrhea as directed by your caregiver to lessen or prevent future episodes of diarrhea. CAUSES  Some common causes include:  Gastrointestinal infections caused by viruses, bacteria, or parasites.  Food poisoning or food allergies.  Certain medicines, such as antibiotics, chemotherapy, and laxatives.  Artificial sweeteners and fructose.  Digestive disorders. HOME CARE INSTRUCTIONS  Ensure adequate fluid intake (hydration): Have 1 cup (8 oz) of fluid for each diarrhea episode. Avoid fluids that contain simple sugars or sports drinks, fruit juices, whole milk products, and sodas. Your urine should be clear or pale yellow if you are drinking enough fluids. Hydrate with an oral rehydration solution that you can purchase at pharmacies, retail stores, and online. You can prepare an oral rehydration solution at home by mixing the following ingredients together:   - tsp table salt.   tsp baking soda.   tsp salt substitute containing potassium chloride.  1  tablespoons sugar.  1 L (34 oz) of water.  Certain foods and beverages may increase the speed at which food moves through  the gastrointestinal (GI) tract. These foods and beverages should be avoided and include:  Caffeinated and alcoholic beverages.  High-fiber foods, such as raw fruits and vegetables, nuts, seeds, and whole grain breads and cereals.  Foods and beverages sweetened with sugar alcohols, such as xylitol, sorbitol, and mannitol.  Some foods may be well tolerated and may help thicken stool including:  Starchy foods, such as rice, toast, pasta, low-sugar cereal, oatmeal, grits, baked potatoes,  crackers, and bagels.  Bananas.  Applesauce.  Add probiotic-rich foods to help increase healthy bacteria in the GI tract, such as yogurt and fermented milk products.  Wash your hands well after each diarrhea episode.  Only take over-the-counter or prescription medicines as directed by your caregiver.  Take a warm bath to relieve any burning or pain from frequent diarrhea episodes. SEEK IMMEDIATE MEDICAL CARE IF:   You are unable to keep fluids down.  You have persistent vomiting.  You have blood in your stool, or your stools are black and tarry.  You do not urinate in 6-8 hours, or there is only a small amount of very dark urine.  You have abdominal pain that increases or localizes.  You have weakness, dizziness, confusion, or light-headedness.  You have a severe headache.  Your diarrhea gets worse or does not get better.  You have a fever or persistent symptoms for more than 2-3 days.  You have a fever and your symptoms suddenly get worse. MAKE SURE YOU:   Understand these instructions.  Will watch your condition.  Will get help right away if you are not doing well or get worse.   This information is not intended to replace advice given to you by your health care provider. Make sure you discuss any questions you have with your health care provider.   Document Released: 06/29/2002 Document Revised: 07/30/2014 Document Reviewed: 03/16/2012 Elsevier Interactive Patient Education [employer].

## 2015-12-30 NOTE — ED Notes (Signed)
Patient transported to CT 

## 2016-01-17 DIAGNOSIS — H903 Sensorineural hearing loss, bilateral: Secondary | ICD-10-CM | POA: Insufficient documentation

## 2016-01-17 DIAGNOSIS — H938X1 Other specified disorders of right ear: Secondary | ICD-10-CM | POA: Insufficient documentation

## 2016-02-20 ENCOUNTER — Encounter: Payer: Self-pay | Admitting: Physician Assistant

## 2016-02-22 ENCOUNTER — Other Ambulatory Visit: Payer: Self-pay | Admitting: Cardiology

## 2016-02-22 NOTE — Telephone Encounter (Signed)
Rx request sent to pharmacy.  

## 2016-03-06 ENCOUNTER — Encounter (HOSPITAL_COMMUNITY): Payer: Self-pay | Admitting: *Deleted

## 2016-03-06 ENCOUNTER — Emergency Department (HOSPITAL_COMMUNITY): Payer: Medicare Other

## 2016-03-06 ENCOUNTER — Inpatient Hospital Stay (HOSPITAL_COMMUNITY)
Admission: EM | Admit: 2016-03-06 | Discharge: 2016-03-13 | DRG: 392 | Disposition: A | Discharge status: Skilled Nursing Facility | Payer: Medicare Other | Attending: Internal Medicine | Admitting: Internal Medicine

## 2016-03-06 DIAGNOSIS — Z955 Presence of coronary angioplasty implant and graft: Secondary | ICD-10-CM

## 2016-03-06 DIAGNOSIS — E1142 Type 2 diabetes mellitus with diabetic polyneuropathy: Secondary | ICD-10-CM | POA: Diagnosis present

## 2016-03-06 DIAGNOSIS — I482 Chronic atrial fibrillation: Secondary | ICD-10-CM | POA: Diagnosis present

## 2016-03-06 DIAGNOSIS — K580 Irritable bowel syndrome with diarrhea: Secondary | ICD-10-CM | POA: Diagnosis present

## 2016-03-06 DIAGNOSIS — Z7984 Long term (current) use of oral hypoglycemic drugs: Secondary | ICD-10-CM

## 2016-03-06 DIAGNOSIS — R0789 Other chest pain: Secondary | ICD-10-CM | POA: Diagnosis not present

## 2016-03-06 DIAGNOSIS — I4891 Unspecified atrial fibrillation: Secondary | ICD-10-CM | POA: Diagnosis present

## 2016-03-06 DIAGNOSIS — R55 Syncope and collapse: Secondary | ICD-10-CM | POA: Diagnosis not present

## 2016-03-06 DIAGNOSIS — F329 Major depressive disorder, single episode, unspecified: Secondary | ICD-10-CM | POA: Diagnosis not present

## 2016-03-06 DIAGNOSIS — M25551 Pain in right hip: Secondary | ICD-10-CM

## 2016-03-06 DIAGNOSIS — I2510 Atherosclerotic heart disease of native coronary artery without angina pectoris: Secondary | ICD-10-CM | POA: Diagnosis not present

## 2016-03-06 DIAGNOSIS — R079 Chest pain, unspecified: Secondary | ICD-10-CM | POA: Diagnosis not present

## 2016-03-06 DIAGNOSIS — I129 Hypertensive chronic kidney disease with stage 1 through stage 4 chronic kidney disease, or unspecified chronic kidney disease: Secondary | ICD-10-CM | POA: Diagnosis present

## 2016-03-06 DIAGNOSIS — M25562 Pain in left knee: Secondary | ICD-10-CM

## 2016-03-06 DIAGNOSIS — F0390 Unspecified dementia without behavioral disturbance: Secondary | ICD-10-CM | POA: Diagnosis present

## 2016-03-06 DIAGNOSIS — K219 Gastro-esophageal reflux disease without esophagitis: Secondary | ICD-10-CM | POA: Diagnosis not present

## 2016-03-06 DIAGNOSIS — Z8249 Family history of ischemic heart disease and other diseases of the circulatory system: Secondary | ICD-10-CM

## 2016-03-06 DIAGNOSIS — E119 Type 2 diabetes mellitus without complications: Secondary | ICD-10-CM

## 2016-03-06 DIAGNOSIS — I10 Essential (primary) hypertension: Secondary | ICD-10-CM | POA: Diagnosis present

## 2016-03-06 DIAGNOSIS — D638 Anemia in other chronic diseases classified elsewhere: Secondary | ICD-10-CM | POA: Diagnosis present

## 2016-03-06 DIAGNOSIS — I493 Ventricular premature depolarization: Secondary | ICD-10-CM

## 2016-03-06 DIAGNOSIS — E1122 Type 2 diabetes mellitus with diabetic chronic kidney disease: Secondary | ICD-10-CM | POA: Diagnosis present

## 2016-03-06 DIAGNOSIS — R0602 Shortness of breath: Secondary | ICD-10-CM

## 2016-03-06 DIAGNOSIS — R0902 Hypoxemia: Secondary | ICD-10-CM | POA: Diagnosis not present

## 2016-03-06 DIAGNOSIS — F32A Depression, unspecified: Secondary | ICD-10-CM | POA: Diagnosis present

## 2016-03-06 DIAGNOSIS — Z7901 Long term (current) use of anticoagulants: Secondary | ICD-10-CM

## 2016-03-06 DIAGNOSIS — Z881 Allergy status to other antibiotic agents status: Secondary | ICD-10-CM

## 2016-03-06 DIAGNOSIS — M069 Rheumatoid arthritis, unspecified: Secondary | ICD-10-CM | POA: Diagnosis present

## 2016-03-06 DIAGNOSIS — N183 Chronic kidney disease, stage 3 (moderate): Secondary | ICD-10-CM | POA: Diagnosis present

## 2016-03-06 DIAGNOSIS — M1712 Unilateral primary osteoarthritis, left knee: Secondary | ICD-10-CM | POA: Diagnosis present

## 2016-03-06 DIAGNOSIS — R109 Unspecified abdominal pain: Secondary | ICD-10-CM

## 2016-03-06 DIAGNOSIS — E669 Obesity, unspecified: Secondary | ICD-10-CM | POA: Diagnosis present

## 2016-03-06 DIAGNOSIS — R001 Bradycardia, unspecified: Secondary | ICD-10-CM | POA: Diagnosis not present

## 2016-03-06 DIAGNOSIS — R52 Pain, unspecified: Secondary | ICD-10-CM

## 2016-03-06 DIAGNOSIS — M79606 Pain in leg, unspecified: Secondary | ICD-10-CM

## 2016-03-06 DIAGNOSIS — Z79899 Other long term (current) drug therapy: Secondary | ICD-10-CM

## 2016-03-06 DIAGNOSIS — I4510 Unspecified right bundle-branch block: Secondary | ICD-10-CM | POA: Diagnosis present

## 2016-03-06 DIAGNOSIS — M810 Age-related osteoporosis without current pathological fracture: Secondary | ICD-10-CM | POA: Diagnosis present

## 2016-03-06 DIAGNOSIS — E785 Hyperlipidemia, unspecified: Secondary | ICD-10-CM | POA: Diagnosis present

## 2016-03-06 DIAGNOSIS — M25462 Effusion, left knee: Secondary | ICD-10-CM | POA: Diagnosis present

## 2016-03-06 HISTORY — DX: Chronic kidney disease, stage 3 (moderate): N18.3

## 2016-03-06 HISTORY — DX: Ventricular premature depolarization: I49.3

## 2016-03-06 HISTORY — DX: Bradycardia, unspecified: R00.1

## 2016-03-06 HISTORY — DX: Chronic kidney disease, stage 3 unspecified: N18.30

## 2016-03-06 LAB — BASIC METABOLIC PANEL
ANION GAP: 8 (ref 5–15)
BUN: 12 mg/dL (ref 6–20)
CHLORIDE: 102 mmol/L (ref 101–111)
CO2: 28 mmol/L (ref 22–32)
Calcium: 9.5 mg/dL (ref 8.9–10.3)
Creatinine, Ser: 1.14 mg/dL — ABNORMAL HIGH (ref 0.44–1.00)
GFR calc Af Amer: 48 mL/min — ABNORMAL LOW (ref >60)
GFR, EST NON AFRICAN AMERICAN: 41 mL/min — AB (ref >60)
GLUCOSE: 116 mg/dL — AB (ref 65–99)
POTASSIUM: 4.2 mmol/L (ref 3.5–5.1)
Sodium: 138 mmol/L (ref 135–145)

## 2016-03-06 LAB — CBC
HEMATOCRIT: 41.5 % (ref 36.0–46.0)
HEMOGLOBIN: 13.6 g/dL (ref 12.0–15.0)
MCH: 31.1 pg (ref 26.0–34.0)
MCHC: 32.8 g/dL (ref 30.0–36.0)
MCV: 95 fL (ref 78.0–100.0)
Platelets: 226 10*3/uL (ref 150–400)
RBC: 4.37 MIL/uL (ref 3.87–5.11)
RDW: 13 % (ref 11.5–15.5)
WBC: 6.5 10*3/uL (ref 4.0–10.5)

## 2016-03-06 LAB — I-STAT TROPONIN, ED: Troponin i, poc: 0.01 ng/mL (ref 0.00–0.08)

## 2016-03-06 LAB — TSH: TSH: 1.499 u[IU]/mL (ref 0.350–4.500)

## 2016-03-06 LAB — GLUCOSE, CAPILLARY: Glucose-Capillary: 93 mg/dL (ref 65–99)

## 2016-03-06 LAB — TROPONIN I

## 2016-03-06 LAB — BRAIN NATRIURETIC PEPTIDE: B NATRIURETIC PEPTIDE 5: 194.4 pg/mL — AB (ref 0.0–100.0)

## 2016-03-06 MED ORDER — MORPHINE SULFATE (PF) 2 MG/ML IV SOLN: 1.0000 mg | INTRAVENOUS | Status: DC | PRN

## 2016-03-06 MED ORDER — POLYETHYLENE GLYCOL 3350 17 GM/SCOOP PO POWD
17.0000 g | Freq: Every day | ORAL | Status: DC
  Filled 2016-03-06: qty 255

## 2016-03-06 MED ORDER — ESCITALOPRAM OXALATE 10 MG PO TABS
10.0000 mg | ORAL_TABLET | Freq: Every day | ORAL | Status: DC
  Filled 2016-03-06: qty 1

## 2016-03-06 MED ORDER — AMLODIPINE BESYLATE 5 MG PO TABS
5.0000 mg | ORAL_TABLET | Freq: Every day | ORAL | Status: DC
  Administered 2016-03-07 – 2016-03-13 (×7): 5 mg via ORAL
  Filled 2016-03-06 (×7): qty 1

## 2016-03-06 MED ORDER — INSULIN ASPART 100 UNIT/ML ~~LOC~~ SOLN
0.0000 [IU] | Freq: Three times a day (TID) | SUBCUTANEOUS | Status: DC
  Administered 2016-03-09: 1 [IU] via SUBCUTANEOUS
  Administered 2016-03-12: 2 [IU] via SUBCUTANEOUS
  Administered 2016-03-12: 1 [IU] via SUBCUTANEOUS
  Administered 2016-03-12: 2 [IU] via SUBCUTANEOUS
  Administered 2016-03-13: 3 [IU] via SUBCUTANEOUS

## 2016-03-06 MED ORDER — GABAPENTIN 600 MG PO TABS
600.0000 mg | ORAL_TABLET | Freq: Two times a day (BID) | ORAL | Status: DC
  Administered 2016-03-06 – 2016-03-13 (×14): 600 mg via ORAL
  Filled 2016-03-06 (×14): qty 1

## 2016-03-06 MED ORDER — PANTOPRAZOLE SODIUM 40 MG PO TBEC
40.0000 mg | DELAYED_RELEASE_TABLET | Freq: Two times a day (BID) | ORAL | Status: DC
  Administered 2016-03-06 – 2016-03-13 (×14): 40 mg via ORAL
  Filled 2016-03-06 (×14): qty 1

## 2016-03-06 MED ORDER — ONDANSETRON HCL 4 MG/2ML IJ SOLN: 4.0000 mg | Freq: Four times a day (QID) | INTRAMUSCULAR | Status: DC | PRN

## 2016-03-06 MED ORDER — POLYETHYLENE GLYCOL 3350 17 G PO PACK
17.0000 g | PACK | Freq: Every day | ORAL | Status: DC
  Administered 2016-03-09 – 2016-03-12 (×4): 17 g via ORAL
  Filled 2016-03-06 (×6): qty 1

## 2016-03-06 MED ORDER — ALBUTEROL SULFATE HFA 108 (90 BASE) MCG/ACT IN AERS: 2.0000 | INHALATION_SPRAY | RESPIRATORY_TRACT | Status: DC | PRN

## 2016-03-06 MED ORDER — METOPROLOL TARTRATE 25 MG PO TABS
12.5000 mg | ORAL_TABLET | Freq: Two times a day (BID) | ORAL | Status: DC
  Administered 2016-03-06 – 2016-03-07 (×2): 12.5 mg via ORAL
  Filled 2016-03-06 (×2): qty 1

## 2016-03-06 MED ORDER — OXYBUTYNIN CHLORIDE 5 MG PO TABS
5.0000 mg | ORAL_TABLET | Freq: Every day | ORAL | Status: DC
  Administered 2016-03-07 – 2016-03-13 (×7): 5 mg via ORAL
  Filled 2016-03-06 (×7): qty 1

## 2016-03-06 MED ORDER — NITROGLYCERIN 0.4 MG SL SUBL: 0.4000 mg | SUBLINGUAL_TABLET | SUBLINGUAL | Status: DC | PRN

## 2016-03-06 MED ORDER — FLUTICASONE PROPIONATE 50 MCG/ACT NA SUSP
1.0000 | Freq: Every day | NASAL | Status: DC | PRN
  Filled 2016-03-06: qty 16

## 2016-03-06 MED ORDER — HYDROCODONE-ACETAMINOPHEN 5-325 MG PO TABS
1.0000 | ORAL_TABLET | ORAL | Status: DC | PRN
  Administered 2016-03-09 – 2016-03-12 (×5): 1 via ORAL
  Filled 2016-03-06 (×5): qty 1

## 2016-03-06 MED ORDER — ASPIRIN EC 81 MG PO TBEC
81.0000 mg | DELAYED_RELEASE_TABLET | Freq: Every day | ORAL | Status: DC
  Administered 2016-03-07 – 2016-03-09 (×3): 81 mg via ORAL
  Filled 2016-03-06 (×3): qty 1

## 2016-03-06 MED ORDER — FAMOTIDINE IN NACL 20-0.9 MG/50ML-% IV SOLN: 20.0000 mg | Freq: Two times a day (BID) | INTRAVENOUS | Status: DC

## 2016-03-06 MED ORDER — OCUVITE-LUTEIN PO CAPS
1.0000 | ORAL_CAPSULE | Freq: Every day | ORAL | Status: DC
  Filled 2016-03-06: qty 1

## 2016-03-06 MED ORDER — ACETAMINOPHEN 325 MG PO TABS
650.0000 mg | ORAL_TABLET | ORAL | Status: DC | PRN
  Administered 2016-03-08: 650 mg via ORAL
  Filled 2016-03-06 (×2): qty 2

## 2016-03-06 MED ORDER — ALBUTEROL SULFATE (2.5 MG/3ML) 0.083% IN NEBU: 2.5000 mg | INHALATION_SOLUTION | RESPIRATORY_TRACT | Status: DC | PRN

## 2016-03-06 MED ORDER — ROSUVASTATIN CALCIUM 5 MG PO TABS
5.0000 mg | ORAL_TABLET | Freq: Every day | ORAL | Status: DC
  Administered 2016-03-06 – 2016-03-12 (×7): 5 mg via ORAL
  Filled 2016-03-06 (×7): qty 1

## 2016-03-06 MED ORDER — APIXABAN 5 MG PO TABS
5.0000 mg | ORAL_TABLET | Freq: Two times a day (BID) | ORAL | Status: DC
  Administered 2016-03-06 – 2016-03-13 (×14): 5 mg via ORAL
  Filled 2016-03-06 (×14): qty 1

## 2016-03-06 NOTE — ED Provider Notes (Addendum)
[facility] DEPT Provider Note   CSN: [id number] Arrival date & time: 03/06/16  1712     History   Chief Complaint Chief Complaint  Patient presents with  . Chest Pain    HPI [name] is a 82 y.o. female.  HPI  83 year old female who presents with chest pain. History of CAD s/p DES to mLAD and balloon angioplasty, DM, HTN, HLD, afib on Eliquis. He does not recall similar chest pain in the past. States that around lunchtime while at rest she had left-sided chest pressure associated with some mild shortness of breath. Also felt hot and clammy that was followed with 3 episodes of diarrhea. No nausea or vomiting, syncope or near syncope, lower extremity swelling or pain, pleuritic pain, fevers, cough, or recent infection. States that she is unsure if it was worse with exertion and she normally is not very exertional at all. Presented to ED for evaluation. Pain has been ongoing now for several hours but she states is gradually been self resolving. She did take 4 baby aspirins prior to arrival to the ED.   Past Medical History:  Diagnosis Date  . 1St degree AV block   . Acute renal failure ([place])    due to dehydration-Sept 2009; Normal creat 1.2 on fu 05/13/2009  . Allergic rhinitis   . Anemia    NOS chronic disease. Hgb 11.6gm% 04/14/2009  . CAD (coronary artery disease)    a. s/p DES mLAD 2005. b. Cutting balloon angioplasty for ISR 2006. c. LHC 08/2010 nonobstructive. d. Myoview 06/2012: small anteroapical infarct/mod inferolat wall infarct but no ischemia, EF 75%.  . Cancer ([place])    on nose and had it removed  . Chronic anticoagulation   . DEMENTIA    slight  . Diabetes mellitus type II   . Diverticulitis of colon   . DM neuropathy, type II diabetes mellitus ([place])   . GERD (gastroesophageal reflux disease)   . HTN (hypertension)   . Hx of colonoscopy   . Hyperlipidemia   . IBS (irritable bowel syndrome)   . Multinodular goiter   . Obesity    BMI-37  .  Orthostatic hypotension   . Osteoarthritis    lower back  . Osteoporosis   . PAF (paroxysmal atrial fibrillation) ([facility])    noted on event monitor. No correlation with episodes and symptoms  . Pneumonia   . RBBB   . Rheumatoid arthritis ([place])   . Spinal stenosis    djd lumbar    Patient Active Problem List   Diagnosis Date Noted  . Depression 03/06/2016  . Unstable angina ([place]) 10/24/2015  . Chronic anticoagulation for A fib, CHA2DS2VASc = 6 12/18/2014  . Chest pain, negative MI, negative Nuc, may be GI 12/17/2014  . Diabetes mellitus type II, non insulin dependent ([place])   . HTN (hypertension)   . Tachycardia 08/07/2011  . Atrial fibrillation, permanent 06/25/2011  . Near Syncope 06/06/2011  . Coronary artery disease 04/05/2011  . BRADYCARDIA 12/09/2009  . ACUTE BRONCHITIS 06/17/2009  . C O P D 06/17/2009  . PULMONARY NODULE, RIGHT UPPER LOBE 06/07/2009  . SHORTNESS OF BREATH (SOB) 06/07/2009  . POSTURAL HYPOTENSION 05/23/2009  . DEHYDRATION 05/13/2009  . ABDOMINAL PAIN, LOWER 01/21/2009  . HOARSENESS, CHRONIC 12/03/2007  . CHEST PAIN 12/03/2007  . GOITER 09/30/2007  . DIABETIC PERIPHERAL NEUROPATHY 09/30/2007  . OBESITY 09/30/2007  . HIATAL HERNIA 09/30/2007  . OSTEOARTHRITIS 09/30/2007  . SPINAL STENOSIS 09/30/2007  . PERIPHERAL EDEMA 09/30/2007  .  HLD (hyperlipidemia) 02/12/2007  . ANEMIA-NOS 02/12/2007  . PERIPHERAL NEUROPATHY 02/12/2007  . Allergic rhinitis, cause unspecified 02/12/2007  . GERD 02/12/2007  . DIVERTICULOSIS, COLON 02/12/2007    Past Surgical History:  Procedure Laterality Date  . ABDOMINAL HYSTERECTOMY    . bilateral arthroscopic knee surgery    . carpel tunnel wrist rt    . CHOLECYSTECTOMY    . CORONARY ANGIOPLASTY     stent  . NASAL SINUS SURGERY     x2  . stent surgery      OB History    No data available       Home Medications    Prior to Admission medications   Medication Sig Start Date End Date Taking? Authorizing  Provider  albuterol (PROVENTIL HFA;VENTOLIN HFA) 108 (90 Base) MCG/ACT inhaler Inhale 2 puffs into the lungs every 6 (six) hours as needed for wheezing or shortness of breath.  11/11/15 11/10/16 Yes Historical Provider, MD  alendronate (FOSAMAX) 70 MG tablet TAKE 1 TABLET BY MOUTH EVERY 7 DAYS ON TUESDAYS WITH A FULL GLASS OF WATER & ON AN EMPTY STOMACH 02/22/16  Yes Fay Records, MD  amLODipine (NORVASC) 5 MG tablet Take 5 mg by mouth daily. 02/20/16  Yes Historical Provider, MD  apixaban (ELIQUIS) 5 MG TABS tablet Take 1 tablet (5 mg total) by mouth 2 (two) times daily. 01/31/15  Yes Scott Joylene Draft, PA-C  Calcium Carbonate-Vit D-Min (CALCIUM 1200 PO) Take 1 tablet by mouth daily.    Yes Historical Provider, MD  gabapentin (NEURONTIN) 600 MG tablet Take 600 mg by mouth 2 (two) times daily.  10/25/13  Yes Historical Provider, MD  glipiZIDE (GLUCOTROL XL) 5 MG 24 hr tablet Take 5 mg by mouth every evening.    Yes Historical Provider, MD  Multiple Vitamins-Minerals (CENTRUM SILVER PO) Take 1 tablet by mouth daily.    Yes Historical Provider, MD  Multiple Vitamins-Minerals (PRESERVISION AREDS PO) Take 1 tablet by mouth 2 (two) times daily.   Yes Historical Provider, MD  ondansetron (ZOFRAN) 8 MG tablet Take 1 tablet (8 mg total) by mouth every 8 (eight) hours as needed for nausea or vomiting. 12/30/15  Yes Daleen Bo, MD  oxybutynin (DITROPAN) 5 MG tablet Take 5 mg by mouth daily.   Yes Historical Provider, MD  pantoprazole (PROTONIX) 40 MG tablet Take 1 tablet (40 mg total) by mouth 2 (two) times daily. 10/25/15  Yes Rhonda G Barrett, PA-C  rosuvastatin (CRESTOR) 5 MG tablet Take 5 mg by mouth at bedtime.   Yes Historical Provider, MD  escitalopram (LEXAPRO) 10 MG tablet Take 10 mg by mouth daily. 03/29/15   Historical Provider, MD  fluticasone (FLONASE) 50 MCG/ACT nasal spray Place 1 spray into both nostrils daily.  01/17/16   Historical Provider, MD  nitroGLYCERIN (NITROSTAT) 0.4 MG SL tablet Place 0.4 mLs under  the tongue every 5 (five) minutes as needed for chest pain (Max 3 doses).  08/16/14   Historical Provider, MD  polyethylene glycol powder (GLYCOLAX/MIRALAX) powder Take 17 g by mouth daily.  12/12/15   Historical Provider, MD  traMADol (ULTRAM) 50 MG tablet Take 50-100 tablets by mouth 3 (three) times daily as needed for moderate pain.  02/13/16   Historical Provider, MD    Family History Family History  Problem Relation Age of Onset  . Heart attack Father   . Breast cancer Sister   . Heart disease Brother   . Heart attack Son   . Heart attack Daughter   .  Colon cancer Neg Hx   . Stroke Neg Hx     Social History Social History  Substance Use Topics  . Smoking status: Never Smoker  . Smokeless tobacco: Never Used  . Alcohol use No     Allergies   Amoxicillin   Review of Systems Review of Systems 10/14 systems reviewed and are negative other than those stated in the HPI'  Physical Exam Updated Vital Signs Temp 98.8 F (37.1 C) (Oral)   Ht 5\' 3"  (1.6 m)   Wt 164 lb (74.4 kg)   SpO2 97%   BMI 29.05 kg/m   Physical Exam Physical Exam  Nursing note and vitals reviewed. Constitutional: Well developed, well nourished, non-toxic, and in no acute distress Head: Normocephalic and atraumatic.  Mouth/Throat: Oropharynx is clear and moist.  Neck: Normal range of motion. Neck supple.  Cardiovascular: Normal rate and regular rhythm.   Pulmonary/Chest: Effort normal and breath sounds normal.  Abdominal: Soft. There is no tenderness. There is no rebound and no guarding.  Musculoskeletal: Normal range of motion.  Neurological: Alert, no facial droop, fluent speech, moves all extremities symmetrically Skin: Skin is warm and dry.  Psychiatric: Cooperative   ED Treatments / Results  Labs (all labs ordered are listed, but only abnormal results are displayed) Labs Reviewed  BASIC METABOLIC PANEL - Abnormal; Notable for the following:       Result Value   Glucose, Bld 116 (*)      Creatinine, Ser 1.14 (*)    GFR calc non Af Amer 41 (*)    GFR calc Af Amer 48 (*)    All other components within normal limits  CBC  I-STAT TROPOININ, ED    EKG  EKG Interpretation  Date/Time:  Tuesday March 06 2016 17:14:03 EDT Ventricular Rate:  89 PR Interval:    QRS Duration: 149 QT Interval:  383 QTC Calculation: 466 R Axis:   57 Text Interpretation:  Atrial fibrillation Ventricular premature complex Right bundle branch block No acute changes Confirmed by Kyree Fedorko MD, Naviyah Schaffert [phone]) on 03/06/2016 5:35:51 PM       Radiology Dg Chest 2 View  Result Date: 03/06/2016 CLINICAL DATA:  Left-sided chest pain over the last 1 day. EXAM: CHEST  2 VIEW COMPARISON:  Two-view chest x-ray 10/24/2015 FINDINGS: The heart is mildly enlarged. Atherosclerotic calcifications are present at the the aortic arch. Chronic interstitial coarsening is similar the prior exam. Mild bibasilar airspace disease likely reflects atelectasis. There is no edema or effusion to suggest failure. No significant airspace consolidation is present. Degenerate changes of the thoracic spine are stable. The visualized soft tissues and bony thorax are otherwise unremarkable. IMPRESSION: 1. Borderline cardiomegaly without failure. 2. Atherosclerosis of the thoracic aorta. 3. Mild bibasilar airspace disease likely reflects atelectasis. Electronically Signed   By: San Morelle M.D.   On: 03/06/2016 18:14    Procedures Procedures (including critical care time)  Medications Ordered in ED Medications - No data to display   Initial Impression / Assessment and Plan / ED Course  I have reviewed the triage vital signs and the nursing notes.  Pertinent labs & imaging results that were available during my care of the patient were reviewed by me and considered in my medical decision making (see chart for details).  Clinical Course   Heart score 5. EKG not acutely ischemic. Troponin x 1 negative. CXR w/o cardiopulmonary  processes. Symptoms not concerning for dissection, PE, or GI process at this time. Will plan to admit for  cardiac rule out.  Final Clinical Impressions(s) / ED Diagnoses   Final diagnoses:  Chest pain, unspecified chest pain type    New Prescriptions New Prescriptions   No medications on file     Forde Dandy, MD 03/06/16 Foss[place] Danicka Hourihan, MD 03/06/16 [phone]

## 2016-03-06 NOTE — H&P (Signed)
History and Physical    [name] [id number] DOB: [birth year] DOA: 03/06/2016  PCP: Chesley Noon, MD   Patient coming from: Home   Chief Complaint: Chest pain   HPI: [name] is a 82 y.o. female with medical history significant for hypertension, non-insulin-dependent diabetes, depression, chronic atrial fibrillation on Eliquis, and coronary artery disease with stent placed to mid LAD in 2005 and subsequent angioplasty for in-stent restenosis who presents the emergency department for evaluation of left-sided chest pain while at rest. Patient reports pain in her usual state of health and was seated and resting around lunchtime today when she experienced the acute onset of a moderate, dull ache under the left breast. She describes this is nonradiating, persisting, and with no alleviating or exacerbating factors identified. She never experienced this sensation previously. There was some associated dyspnea and a clammy sensation and the episode was followed by loose stools. EMS was activated and 2 doses of sublingual nitroglycerin and 324 mg of aspirin were administered en route. By the time of her arrival, the chest pain had almost completely resolved. Patient denies any significant lower extremity edema, palpitations, cough, orthopnea.  ED Course: Upon arrival to the ED, patient is found to be afebrile, saturating well on room air, and with vital signs otherwise stable. EKG demonstrates atrial fibrillation with the PCA and chronic right bundle branch block. Troponin is within the normal limits at 0.01. Chest x-ray features borderline cardiomegaly without evidence for failure and mild bibasilar airspace disease likely representing atelectasis. Chemistry panel features a mild elevation in serum creatinine to 1.14, up from an apparent baseline of 1.0. CBC is unremarkable. Patient remained hemodynamically stable in the emergency department and enjoyed complete resolution of her chest  pain without any specific interventions in the ED. She will be observed on the telemetry unit for ongoing evaluation and management of chest pain with history of CAD, concerning for possible ACS.  Review of Systems:  All other systems reviewed and apart from HPI, are negative.  Past Medical History:  Diagnosis Date  . 1St degree AV block   . Acute renal failure ([place])    due to dehydration-Sept 2009; Normal creat 1.2 on fu 05/13/2009  . Allergic rhinitis   . Anemia    NOS chronic disease. Hgb 11.6gm% 04/14/2009  . CAD (coronary artery disease)    a. s/p DES mLAD 2005. b. Cutting balloon angioplasty for ISR 2006. c. LHC 08/2010 nonobstructive. d. Myoview 06/2012: small anteroapical infarct/mod inferolat wall infarct but no ischemia, EF 75%.  . Cancer ([place])    on nose and had it removed  . Chronic anticoagulation   . DEMENTIA    slight  . Diabetes mellitus type II   . Diverticulitis of colon   . DM neuropathy, type II diabetes mellitus ([place])   . GERD (gastroesophageal reflux disease)   . HTN (hypertension)   . Hx of colonoscopy   . Hyperlipidemia   . IBS (irritable bowel syndrome)   . Multinodular goiter   . Obesity    BMI-37  . Orthostatic hypotension   . Osteoarthritis    lower back  . Osteoporosis   . PAF (paroxysmal atrial fibrillation) ([facility])    noted on event monitor. No correlation with episodes and symptoms  . Pneumonia   . RBBB   . Rheumatoid arthritis ([place])   . Spinal stenosis    djd lumbar    Past Surgical History:  Procedure Laterality Date  . ABDOMINAL HYSTERECTOMY    .  bilateral arthroscopic knee surgery    . carpel tunnel wrist rt    . CHOLECYSTECTOMY    . CORONARY ANGIOPLASTY     stent  . NASAL SINUS SURGERY     x2  . stent surgery       reports that she has never smoked. She has never used smokeless tobacco. She reports that she does not drink alcohol or use drugs.  Allergies  Allergen Reactions  . Amoxicillin Other (See Comments)    UNKNOWN     Family History  Problem Relation Age of Onset  . Heart attack Father   . Breast cancer Sister   . Heart disease Brother   . Heart attack Son   . Heart attack Daughter   . Colon cancer Neg Hx   . Stroke Neg Hx      Prior to Admission medications   Medication Sig Start Date End Date Taking? Authorizing Provider  albuterol (PROVENTIL HFA;VENTOLIN HFA) 108 (90 Base) MCG/ACT inhaler Inhale 2 puffs into the lungs every 6 (six) hours as needed for wheezing or shortness of breath.  11/11/15 11/10/16 Yes Historical Provider, MD  alendronate (FOSAMAX) 70 MG tablet TAKE 1 TABLET BY MOUTH EVERY 7 DAYS ON TUESDAYS WITH A FULL GLASS OF WATER & ON AN EMPTY STOMACH 02/22/16  Yes Fay Records, MD  amLODipine (NORVASC) 5 MG tablet Take 5 mg by mouth daily. 02/20/16  Yes Historical Provider, MD  apixaban (ELIQUIS) 5 MG TABS tablet Take 1 tablet (5 mg total) by mouth 2 (two) times daily. 01/31/15  Yes Scott Joylene Draft, PA-C  Calcium Carbonate-Vit D-Min (CALCIUM 1200 PO) Take 1 tablet by mouth daily.    Yes Historical Provider, MD  gabapentin (NEURONTIN) 600 MG tablet Take 600 mg by mouth 2 (two) times daily.  10/25/13  Yes Historical Provider, MD  glipiZIDE (GLUCOTROL XL) 5 MG 24 hr tablet Take 5 mg by mouth every evening.    Yes Historical Provider, MD  Multiple Vitamins-Minerals (CENTRUM SILVER PO) Take 1 tablet by mouth daily.    Yes Historical Provider, MD  Multiple Vitamins-Minerals (PRESERVISION AREDS PO) Take 1 tablet by mouth 2 (two) times daily.   Yes Historical Provider, MD  ondansetron (ZOFRAN) 8 MG tablet Take 1 tablet (8 mg total) by mouth every 8 (eight) hours as needed for nausea or vomiting. 12/30/15  Yes Daleen Bo, MD  oxybutynin (DITROPAN) 5 MG tablet Take 5 mg by mouth daily.   Yes Historical Provider, MD  pantoprazole (PROTONIX) 40 MG tablet Take 1 tablet (40 mg total) by mouth 2 (two) times daily. 10/25/15  Yes Rhonda G Barrett, PA-C  rosuvastatin (CRESTOR) 5 MG tablet Take 5 mg by mouth at  bedtime.   Yes Historical Provider, MD  escitalopram (LEXAPRO) 10 MG tablet Take 10 mg by mouth daily. 03/29/15   Historical Provider, MD  fluticasone (FLONASE) 50 MCG/ACT nasal spray Place 1 spray into both nostrils daily.  01/17/16   Historical Provider, MD  nitroGLYCERIN (NITROSTAT) 0.4 MG SL tablet Place 0.4 mLs under the tongue every 5 (five) minutes as needed for chest pain (Max 3 doses).  08/16/14   Historical Provider, MD  polyethylene glycol powder (GLYCOLAX/MIRALAX) powder Take 17 g by mouth daily.  12/12/15   Historical Provider, MD  traMADol (ULTRAM) 50 MG tablet Take 50-100 tablets by mouth 3 (three) times daily as needed for moderate pain.  02/13/16   Historical Provider, MD    Physical Exam: Vitals:   03/06/16 1716 03/06/16 1730  03/06/16 1815 03/06/16 1830  BP:  135/73 138/77 132/90  Pulse:  82 79 75  Resp:  21 24 18   Temp: 98.8 F (37.1 C)     TempSrc: Oral     SpO2:  100% 99% 93%  Weight:      Height:        Constitutional: NAD, calm, comfortable Eyes: PERTLA, lids and conjunctivae normal ENMT: Mucous membranes are moist. Posterior pharynx clear of any exudate or lesions.   Neck: normal, supple, no masses, no thyromegaly Respiratory: clear to auscultation bilaterally, no wheezing, no crackles. Normal respiratory effort.   Cardiovascular: Irregular rhythm, rate [id number]; soft systolic murmur at apex. No carotid bruits. No significant JVD. Abdomen: No distension, no tenderness, no masses palpated. Bowel sounds normal.  Musculoskeletal: no clubbing / cyanosis. No joint deformity upper and lower extremities. Normal muscle tone.  Skin: no significant rashes, lesions, ulcers. Warm, dry, well-perfused. Neurologic: CN 2-12 grossly intact. Sensation intact, DTR normal. Strength 5/5 in all 4 limbs.  Psychiatric: Normal judgment and insight. Alert and oriented x 3. Normal mood and affect.    Labs on Admission: I have personally reviewed following labs and imaging  studies  CBC:  Recent Labs Lab 03/06/16 1725  WBC 6.5  HGB 13.6  HCT 41.5  MCV 95.0  PLT [id number]   Basic Metabolic Panel:  Recent Labs Lab 03/06/16 1725  NA 138  K 4.2  CL 102  CO2 28  GLUCOSE 116*  BUN 12  CREATININE 1.14*  CALCIUM 9.5   GFR: Estimated Creatinine Clearance: 31.7 mL/min (by C-G formula based on SCr of 1.14 mg/dL). Liver Function Tests: No results for input(s): AST, ALT, ALKPHOS, BILITOT, PROT, ALBUMIN in the last 168 hours. No results for input(s): LIPASE, AMYLASE in the last 168 hours. No results for input(s): AMMONIA in the last 168 hours. Coagulation Profile: No results for input(s): INR, PROTIME in the last 168 hours. Cardiac Enzymes: No results for input(s): CKTOTAL, CKMB, CKMBINDEX, TROPONINI in the last 168 hours. BNP (last 3 results) No results for input(s): PROBNP in the last 8760 hours. HbA1C: No results for input(s): HGBA1C in the last 72 hours. CBG: No results for input(s): GLUCAP in the last 168 hours. Lipid Profile: No results for input(s): CHOL, HDL, LDLCALC, TRIG, CHOLHDL, LDLDIRECT in the last 72 hours. Thyroid Function Tests: No results for input(s): TSH, T4TOTAL, FREET4, T3FREE, THYROIDAB in the last 72 hours. Anemia Panel: No results for input(s): VITAMINB12, FOLATE, FERRITIN, TIBC, IRON, RETICCTPCT in the last 72 hours. Urine analysis:    Component Value Date/Time   COLORURINE YELLOW 12/30/2015 1905   APPEARANCEUR CLEAR 12/30/2015 1905   LABSPEC 1.008 12/30/2015 1905   PHURINE 7.0 12/30/2015 1905   GLUCOSEU NEGATIVE 12/30/2015 1905   GLUCOSEU NEGATIVE 11/24/2010 1224   HGBUR NEGATIVE 12/30/2015 1905   BILIRUBINUR NEGATIVE 12/30/2015 1905   KETONESUR NEGATIVE 12/30/2015 1905   PROTEINUR NEGATIVE 12/30/2015 1905   UROBILINOGEN 0.2 05/05/2014 2123   NITRITE NEGATIVE 12/30/2015 1905   LEUKOCYTESUR NEGATIVE 12/30/2015 1905   Sepsis Labs: @LABRCNTIP (procalcitonin:4,lacticidven:4) )No results found for this or any previous  visit (from the past 240 hour(s)).   Radiological Exams on Admission: Dg Chest 2 View  Result Date: 03/06/2016 CLINICAL DATA:  Left-sided chest pain over the last 1 day. EXAM: CHEST  2 VIEW COMPARISON:  Two-view chest x-ray 10/24/2015 FINDINGS: The heart is mildly enlarged. Atherosclerotic calcifications are present at the the aortic arch. Chronic interstitial coarsening is similar the prior exam. Mild bibasilar airspace  disease likely reflects atelectasis. There is no edema or effusion to suggest failure. No significant airspace consolidation is present. Degenerate changes of the thoracic spine are stable. The visualized soft tissues and bony thorax are otherwise unremarkable. IMPRESSION: 1. Borderline cardiomegaly without failure. 2. Atherosclerosis of the thoracic aorta. 3. Mild bibasilar airspace disease likely reflects atelectasis. Electronically Signed   By: San Morelle M.D.   On: 03/06/2016 18:14    EKG: Independently reviewed. Atrial fibrillation, VPC, RBBB  Assessment/Plan  1. Chest pain  - Atypical in that in occurred at rest and not exacerbated by activity; Hx puts her at high-risk  - Initial EKG not significantly and initial troponin 0.01; CXR without acute disease  - Received 2 doses of SL NTG and ASA 324 mg en route - Pain resolving on arrival and became pain-free in ED  - Monitor on telemetry for ischemic changes  - Obtain serial troponin measurements  - Repeat EKG in am, sooner if angina  - Lopressor started for HTN; consider d/c if rules-out for ACS; continue Crestor  - This may represent GERD given hx of such; will trial Pepcid IV in addition to her BID Protonix   2. CAD - DES to mLAD in 2005 and subsequent angioplasty for in-stent restenosis  - LHC (February 2012) with LAD 40%, patent stent, prox & mid RCA 30%, and EF 60%  - Evaluating for ACS as above  - Continue Crestor   3. Chronic atrial fibrillation  - In rate-controlled a fib on admission  - CHADS-VASc  is at least 36 (age x2, gender, CAD, HTN, DM) - Continue AC with Eliquis 5 mg BID    4. Type II DM  - A1c 6.0% in April 2017  - Managed at home with glipizide only, will hold this while in hospital  - Check CBG with meals and qHS  - Start with low-intensity SSI correctional only and adjust prn   5. Hypertension  - Not on any antihypertensives at time of admission  - There is diastolic HTN in ED  - Low-dose Lopressor started in setting of possible ACS    6. Depression  - Stable, continue Lexapro  7. GERD  - Managed with BID Protonix at home, will continue  - Pepcid 20 mg IV q12h added empirically for treatment of atypical CP     DVT prophylaxis: Eliquis Code Status: Full  Family Communication: Discussed with patient Disposition Plan: Observe on telemetry Consults called: None  Admission status: Observation    Vianne Bulls, MD [facility] Pager [phone]  If 7PM-7AM, please contact night-coverage [email or web address] Password [facility]  03/06/2016, 7:22 PM

## 2016-03-06 NOTE — ED Triage Notes (Signed)
Pt in from home via [facility], per report pt c/ o L isded CP onset today 13:00 with hx of A fib, pt reports taking Eliquis, pt c/o x3 diarrhea today, denies n/v/d, A&O x4, pt rcvd x2 sL nitro, 324 mg ASA

## 2016-03-07 ENCOUNTER — Observation Stay (HOSPITAL_COMMUNITY): Payer: Medicare Other

## 2016-03-07 ENCOUNTER — Encounter (HOSPITAL_COMMUNITY): Payer: Self-pay | Admitting: Physician Assistant

## 2016-03-07 ENCOUNTER — Observation Stay (HOSPITAL_BASED_OUTPATIENT_CLINIC_OR_DEPARTMENT_OTHER): Payer: Medicare Other

## 2016-03-07 DIAGNOSIS — F329 Major depressive disorder, single episode, unspecified: Secondary | ICD-10-CM | POA: Diagnosis not present

## 2016-03-07 DIAGNOSIS — R1013 Epigastric pain: Secondary | ICD-10-CM | POA: Diagnosis not present

## 2016-03-07 DIAGNOSIS — I493 Ventricular premature depolarization: Secondary | ICD-10-CM

## 2016-03-07 DIAGNOSIS — I481 Persistent atrial fibrillation: Secondary | ICD-10-CM | POA: Diagnosis not present

## 2016-03-07 DIAGNOSIS — I480 Paroxysmal atrial fibrillation: Secondary | ICD-10-CM | POA: Diagnosis not present

## 2016-03-07 DIAGNOSIS — R079 Chest pain, unspecified: Secondary | ICD-10-CM

## 2016-03-07 DIAGNOSIS — R001 Bradycardia, unspecified: Secondary | ICD-10-CM | POA: Diagnosis not present

## 2016-03-07 DIAGNOSIS — R072 Precordial pain: Secondary | ICD-10-CM | POA: Diagnosis not present

## 2016-03-07 LAB — ECHOCARDIOGRAM COMPLETE
HEIGHTINCHES: 63 in
WEIGHTICAEL: 2750.4 [oz_av]

## 2016-03-07 LAB — BASIC METABOLIC PANEL
ANION GAP: 7 (ref 5–15)
BUN: 14 mg/dL (ref 6–20)
CALCIUM: 9.3 mg/dL (ref 8.9–10.3)
CO2: 27 mmol/L (ref 22–32)
CREATININE: 1.05 mg/dL — AB (ref 0.44–1.00)
Chloride: 105 mmol/L (ref 101–111)
GFR, EST AFRICAN AMERICAN: 53 mL/min — AB (ref >60)
GFR, EST NON AFRICAN AMERICAN: 45 mL/min — AB (ref >60)
Glucose, Bld: 136 mg/dL — ABNORMAL HIGH (ref 65–99)
Potassium: 4.1 mmol/L (ref 3.5–5.1)
SODIUM: 139 mmol/L (ref 135–145)

## 2016-03-07 LAB — HEPATIC FUNCTION PANEL
ALBUMIN: 3.6 g/dL (ref 3.5–5.0)
ALT: 17 U/L (ref 14–54)
AST: 20 U/L (ref 15–41)
Alkaline Phosphatase: 56 U/L (ref 38–126)
BILIRUBIN INDIRECT: 0.7 mg/dL (ref 0.3–0.9)
Bilirubin, Direct: 0.2 mg/dL (ref 0.1–0.5)
TOTAL PROTEIN: 6.3 g/dL — AB (ref 6.5–8.1)
Total Bilirubin: 0.9 mg/dL (ref 0.3–1.2)

## 2016-03-07 LAB — GLUCOSE, CAPILLARY
GLUCOSE-CAPILLARY: 131 mg/dL — AB (ref 65–99)
Glucose-Capillary: 120 mg/dL — ABNORMAL HIGH (ref 65–99)
Glucose-Capillary: 119 mg/dL — ABNORMAL HIGH (ref 65–99)
Glucose-Capillary: 196 mg/dL — ABNORMAL HIGH (ref 65–99)
Glucose-Capillary: 146 mg/dL — ABNORMAL HIGH (ref 65–99)

## 2016-03-07 LAB — LIPASE, BLOOD: LIPASE: 33 U/L (ref 11–51)

## 2016-03-07 LAB — TROPONIN I: Troponin I: <0.03 ng/mL (ref <0.03)

## 2016-03-07 MED ORDER — PROSIGHT PO TABS
1.0000 | ORAL_TABLET | Freq: Every day | ORAL | Status: DC
  Administered 2016-03-07 – 2016-03-13 (×7): 1 via ORAL
  Filled 2016-03-07 (×8): qty 1

## 2016-03-07 MED ORDER — FUROSEMIDE 10 MG/ML IJ SOLN
40.0000 mg | Freq: Once | INTRAMUSCULAR | Status: AC
Start: 2016-03-07 — End: 2016-03-07
  Administered 2016-03-07: 40 mg via INTRAVENOUS
  Filled 2016-03-07: qty 4

## 2016-03-07 MED ORDER — ATROPINE SULFATE 1 MG/10ML IJ SOSY: 0.5000 mg | PREFILLED_SYRINGE | Freq: Once | INTRAMUSCULAR | Status: DC

## 2016-03-07 NOTE — Consult Note (Signed)
Cardiology Consultation Note    Patient ID: [name], MRN: [id number], DOB/AGE: [birth year] 82 y.o. Admit date: 03/06/2016   Date of Consult: 03/07/2016 Primary Physician: Chesley Noon, MD Primary Cardiologist: Harrington Challenger  Chief Complaint: chest pain Reason for Consultation: chest pain Requesting MD: Dr. Myna Hidalgo (chest pain unit consult per unit)  HPI: [name] is a 82 y.o. female with history of CAD (DES to mLAD 2005, cutting balloon angioplasty due to ISR in 2006), chronic-appearing atrial fib (per EKGs back to 2014), DM2, HTN, HLP, anemia, GERD, IBS, spinal stenosis, multinodular goiter, RBBB, CKD stage III per labs (baseline Cr 1-1.1) who presented to [facility] with chest pain. Per review of chart, she has history of atypical chest pain. Last nuc 10/2015 for CP was negative, EF 80%.  She does her own cooking and cleaning at home but admits she doesn't do a lot of activity. She walks with a walker. Yesterday around lunchtime/12pm she developed a sensation of chest pain in her left breast described as a dull ache. She had mild dyspnea. Denies diaphoresis, nausea, or vomiting. Had loose stools shortly after. She did not try anything at home for the pain and nothing made it worse. After several hours of persistent discomfort, she called EMS. She received 4 baby ASA, as well as 2 SL NTG without significant relief. She says it was only until she went for her CXR did symptoms finally ease off spontaneously (6pm) - it had been constant up until then. EKG on arrival during pain showed chronic afib with controlled rate and known RBBB. She has not had pain like this before. She does not recall what her prior angina felt like. She denies any recent symptoms with exertion. No falls, LEE, syncope, bleeding. Labs notable for neg trop x 3, BNP 194, Cr 1.14 (relatively c/w prior - 1). She denies any active pain.  VSS.  Past Medical History:  Diagnosis Date  . 1St degree AV block   . Acute renal failure  ([place])    due to dehydration-Sept 2009; Normal creat 1.2 on fu 05/13/2009  . Allergic rhinitis   . Anemia    NOS chronic disease. Hgb 11.6gm% 04/14/2009  . CAD (coronary artery disease)    a. s/p DES mLAD 2005. b. Cutting balloon angioplasty for ISR 2006. c. LHC 08/2010 nonobstructive. d. Myoview 06/2012: small anteroapical infarct/mod inferolat wall infarct but no ischemia, EF 75%.  . Cancer ([place])    on nose and had it removed  . Chronic anticoagulation   . CKD (chronic kidney disease), stage III   . DEMENTIA    slight  . Diabetes mellitus type II   . Diverticulitis of colon   . DM neuropathy, type II diabetes mellitus ([place])   . GERD (gastroesophageal reflux disease)   . HTN (hypertension)   . Hx of colonoscopy   . Hyperlipidemia   . IBS (irritable bowel syndrome)   . Multinodular goiter   . Obesity    BMI-37  . Orthostatic hypotension   . Osteoarthritis    lower back  . Osteoporosis   . Pneumonia   . RBBB   . Rheumatoid arthritis ([place])   . Spinal stenosis    djd lumbar      Surgical History:  Past Surgical History:  Procedure Laterality Date  . ABDOMINAL HYSTERECTOMY    . bilateral arthroscopic knee surgery    . carpel tunnel wrist rt    . CHOLECYSTECTOMY    . CORONARY ANGIOPLASTY  stent  . NASAL SINUS SURGERY     x2  . stent surgery       Home Meds: Prior to Admission medications   Medication Sig Start Date End Date Taking? Authorizing Provider  albuterol (PROVENTIL HFA;VENTOLIN HFA) 108 (90 Base) MCG/ACT inhaler Inhale 2 puffs into the lungs every 6 (six) hours as needed for wheezing or shortness of breath.  11/11/15 11/10/16 Yes Historical Provider, MD  alendronate (FOSAMAX) 70 MG tablet TAKE 1 TABLET BY MOUTH EVERY 7 DAYS ON TUESDAYS WITH A FULL GLASS OF WATER & ON AN EMPTY STOMACH 02/22/16  Yes Fay Records, MD  amLODipine (NORVASC) 5 MG tablet Take 5 mg by mouth daily. 02/20/16  Yes Historical Provider, MD  apixaban (ELIQUIS) 5 MG TABS tablet Take 1 tablet (5  mg total) by mouth 2 (two) times daily. 01/31/15  Yes Scott Joylene Draft, PA-C  Calcium Carbonate-Vit D-Min (CALCIUM 1200 PO) Take 1 tablet by mouth daily.    Yes Historical Provider, MD  fluticasone (FLONASE) 50 MCG/ACT nasal spray Place 1 spray into both nostrils daily as needed for allergies.  01/17/16  Yes Historical Provider, MD  gabapentin (NEURONTIN) 600 MG tablet Take 600 mg by mouth 2 (two) times daily.  10/25/13  Yes Historical Provider, MD  glipiZIDE (GLUCOTROL XL) 5 MG 24 hr tablet Take 5 mg by mouth every evening.    Yes Historical Provider, MD  Multiple Vitamins-Minerals (CENTRUM SILVER PO) Take 1 tablet by mouth daily.    Yes Historical Provider, MD  Multiple Vitamins-Minerals (PRESERVISION AREDS PO) Take 1 tablet by mouth 2 (two) times daily.   Yes Historical Provider, MD  nitroGLYCERIN (NITROSTAT) 0.4 MG SL tablet Place 0.4 mLs under the tongue every 5 (five) minutes as needed for chest pain (Max 3 doses).  08/16/14  Yes Historical Provider, MD  ondansetron (ZOFRAN) 8 MG tablet Take 1 tablet (8 mg total) by mouth every 8 (eight) hours as needed for nausea or vomiting. 12/30/15  Yes Daleen Bo, MD  oxybutynin (DITROPAN) 5 MG tablet Take 5 mg by mouth daily.   Yes Historical Provider, MD  pantoprazole (PROTONIX) 40 MG tablet Take 1 tablet (40 mg total) by mouth 2 (two) times daily. 10/25/15  Yes Rhonda G Barrett, PA-C  rosuvastatin (CRESTOR) 5 MG tablet Take 5 mg by mouth at bedtime.   Yes Historical Provider, MD  traMADol (ULTRAM) 50 MG tablet Take 100 mg by mouth daily as needed for moderate pain.  02/13/16  Yes Historical Provider, MD    Inpatient Medications:  . amLODipine  5 mg Oral Daily  . apixaban  5 mg Oral BID  . aspirin EC  81 mg Oral Daily  . escitalopram  10 mg Oral Daily  . gabapentin  600 mg Oral BID  . insulin aspart  0-9 Units Subcutaneous TID WC  . metoprolol tartrate  12.5 mg Oral BID  . multivitamin  1 tablet Oral Daily  . oxybutynin  5 mg Oral Daily  . pantoprazole   40 mg Oral BID  . polyethylene glycol  17 g Oral Daily  . rosuvastatin  5 mg Oral QHS      Allergies:  Allergies  Allergen Reactions  . Amoxicillin Other (See Comments)    UNKNOWN    Social History   Social History  . Marital status: Married    Spouse name: N/A  . Number of children: N/A  . Years of education: N/A   Occupational History  . retired  Social History Main Topics  . Smoking status: Never Smoker  . Smokeless tobacco: Never Used  . Alcohol use No  . Drug use: No  . Sexual activity: No   Other Topics Concern  . Not on file   Social History Narrative  . No narrative on file     Family History  Problem Relation Age of Onset  . Heart attack Father   . Breast cancer Sister   . Heart disease Brother   . Heart attack Son   . Heart attack Daughter   . Colon cancer Neg Hx   . Stroke Neg Hx      Review of Systems: no DOE, rash, fever. All other systems reviewed and are otherwise negative except as noted above.  Labs:  Recent Labs  03/06/16 2000 03/07/16 0224  TROPONINI <0.03 <0.03   Lab Results  Component Value Date   WBC 6.5 03/06/2016   HGB 13.6 03/06/2016   HCT 41.5 03/06/2016   MCV 95.0 03/06/2016   PLT 226 03/06/2016    Recent Labs Lab 03/06/16 1725  NA 138  K 4.2  CL 102  CO2 28  BUN 12  CREATININE 1.14*  CALCIUM 9.5  GLUCOSE 116*   Lab Results  Component Value Date   CHOL 145 10/25/2015   HDL 52 10/25/2015   LDLCALC 76 10/25/2015   TRIG 84 10/25/2015     Radiology/Studies:  Dg Chest 2 View  Result Date: 03/06/2016 CLINICAL DATA:  Left-sided chest pain over the last 1 day. EXAM: CHEST  2 VIEW COMPARISON:  Two-view chest x-ray 10/24/2015 FINDINGS: The heart is mildly enlarged. Atherosclerotic calcifications are present at the the aortic arch. Chronic interstitial coarsening is similar the prior exam. Mild bibasilar airspace disease likely reflects atelectasis. There is no edema or effusion to suggest failure. No  significant airspace consolidation is present. Degenerate changes of the thoracic spine are stable. The visualized soft tissues and bony thorax are otherwise unremarkable. IMPRESSION: 1. Borderline cardiomegaly without failure. 2. Atherosclerosis of the thoracic aorta. 3. Mild bibasilar airspace disease likely reflects atelectasis. Electronically Signed   By: San Morelle M.D.   On: 03/06/2016 18:14    Wt Readings from Last 3 Encounters:  03/07/16 171 lb 14.4 oz (78 kg)  11/18/15 170 lb 6.4 oz (77.3 kg)  11/16/15 173 lb 12.8 oz (78.8 kg)    EKG: atrial fib with known RBBB, occ PVC - HR 89 on first EKG and 76 on follow-up  Physical Exam: Blood pressure 124/70, pulse 66, temperature 98.2 F (36.8 C), temperature source Oral, resp. rate 20, height 5\' 3"  (1.6 m), weight 171 lb 14.4 oz (78 kg), SpO2 99 %. Body mass index is 30.45 kg/m. General: Well developed, well nourished, in no acute distress. Head: Normocephalic, atraumatic, sclera non-icteric, no xanthomas, nares are without discharge.  Neck: Negative for carotid bruits. JVD not elevated. Lungs: Clear bilaterally to auscultation without wheezes, rales, or rhonchi. Breathing is unlabored. Heart: Irregularly irregular, rate controlled, with S1 S2. No murmurs, rubs, or gallops appreciated. Abdomen: Soft, non-tender, non-distended with normoactive bowel sounds. No hepatomegaly. No rebound/guarding. No obvious abdominal masses. Msk:  Strength and tone appear normal for age. Extremities: No clubbing or cyanosis. No edema.  Distal pedal pulses are 2+ and equal bilaterally. Neuro: Alert and oriented X 3. No facial asymmetry. No focal deficit. Moves all extremities spontaneously. Psych:  Responds to questions appropriately with a normal affect.     Assessment and Plan  82 y.o. female with  history of CAD (DES to mLAD 2005, cutting balloon angioplasty due to ISR in 2006), chronic-appearing atrial fib (per EKGs back to 2014), DM2, HTN, HLP,  anemia, GERD, IBS, spinal stenosis, multinodular goiter, RBBB, CKD stage III per labs (baseline Cr 1-1.1) who presented to [facility] with chest pain, constant for 6 hours.  1. Chest pain - somewhat atypical, no objective evidence of ischemia thus far. Neg nuc 10/2015. Will review with MD. Agree with IM assessment that this may have represented GI etiology.  2. CAD s/p prior PCI - continue BB, statin. Not on home ASA due to concomitant Eliquis.  3. Chronic atrial fib - rate controlled, mildly bradycardic into upper 40s during sleeping hours, occ PVCs - asymptomatic. Continue anticoagulation. Will need close monitoring of weight/Cr over time given that she is on full dose Eliquis.  4. HTN - controlled.   Signed, Charlie Pitter PA-C 03/07/2016, 8:18 AM Pager: [phone]  Pt seen and examined  I am familiar with pt from clinic  I saw her earlier this spring Pt with known CAD and afib   NOw with CP  Atypical  Not associated with activity  Does have history of reflux that does not appear controlled  Does not have a GI doctor  On exam:  Lungs CTA  Cardiac Irreg irreg  No S3  No signif murmurs   Abd with mild RUQ tenderness  Liver edge not distended  Ext with tr edema  Labs signif for mild increase in BNP  Trop neg x 3  Echo being done during my exam  LVEF normal with no definite wall motion abnormalities P PFO vs small ASD prsent with flow noted by color doppler  RV normal in size  Atria large with septum bulging to R  I do not think CP is cardiac  Agree with USN of RUQ   Ques other options for reflux  She has mild increase volume on exam  When returns from scan would give lasix 40 x 1 IV  ASD is incidental finding  I do not think cause of problem  Dorris Carnes

## 2016-03-07 NOTE — Progress Notes (Signed)
At  [place] NT called out for assistance. NT had gotten patient OOB and on to [facility] to urinate. NT said patient said " I just feel really weak. Why do I feel so weak?" NT said patient's head leaned back and eyes rolled back. Upon entering room, pt unresponsive on BSC, gray in color and lips blue. Code Blue called. Patient assisted to bed off BSC via 4 staff members and compressions started. After 5-6 compressions, patient became alert but foggy, asking "what happened to me?" Pt unable to recall events. CBG and VSS WNL. Upon review of telemetry, appeared patient was AFib in the 50s, then noted to have greater than 7 second pause followed by brady in the 20s. Strips saved to EPIC. 12 lead EKG obtained and viewed by rapid response RN - no obvious changes noted. Dr. Allyson Sabal and Ignacia Bayley, NP with cardiology paged and made aware. Pt changed to SDU status and lopressor discontinued. Pt currently resting in bed comfortably. States she feels "tired and worn out."   But on the phone talking to a family member, VSS. Ignacia Bayley, NP to come up and evaluate patient. Nightshift RN, Colletta Maryland updated and made aware.

## 2016-03-07 NOTE — Progress Notes (Signed)
    S:  CTSP after 7.8 second pause and brief loc while using the toilet earlier.  Per report, she was on the [facility] voiding.  Staff found her unresponsive and she was moved to bed.  CPR was briefly performed prior to pt becoming responsive.  On tele, she was noted to have a 7.8 second pause followed by a brief period of severe bradycardia.  Currently, she remains in AFib.  Previously ordered  blocker has been discontinued.  Currently pt has no complaints but is concerned about earlier events.  Of note, she has since used the [facility] again, and had a BM w/o incident.  O:   Vitals:   03/07/16 1951 03/07/16 2018  BP: (!) 144/78 (!) 144/78  Pulse:  66  Resp:  20  Temp:  98 F (36.7 C)   Pleasant, NAD, AAOx3.  Lungs CTA, Cor RRR.  A/P:  1.  Symptomatic bradycardia:  Pt developed severe, symptomatic bradycardia and heart block in the setting of underlying afib on  blocker therapy while voiding on the bedside commode earlier.  Per staff, pt said that she was straining some while using the toilet, but currently denies this.  She has been moved to stepdown.   blocker has been d/c'd (this was new this admission).  ? Micturition syncope/vasovagal episose.  Follow tele off of  blocker and place zoll pads.  Will ask EP to see in AM.  Murray Hodgkins, NP

## 2016-03-07 NOTE — Progress Notes (Addendum)
[facility] PROGRESS NOTE  [name] [id number] DOB: 1924/03/14 DOA: 03/06/2016   PCP: Chesley Noon, MD     Assessment/Plan: Active Problems:   Bradycardia   GERD   Coronary artery disease   Atrial fibrillation, permanent   Diabetes mellitus type II, non insulin dependent (HCC)   HTN (hypertension)   Chest pain, negative MI, negative Nuc, may be GI   Depression   Chest pain at rest   PVC's (premature ventricular contractions)   82 y.o. female with history of CAD (DES to mLAD 2005, cutting balloon angioplasty due to ISR in 2006), chronic-appearing atrial fib (per EKGs back to 2014), DM2, HTN, HLP, anemia, GERD, IBS, spinal stenosis, multinodular goiter, RBBB, CKD stage III per labs (baseline Cr 1-1.1) who presented to [facility] with chest pain. Per review of chart, she has history of atypical chest pain. Last nuc 10/2015 for CP was negative, EF 80%.  Assessment and plan 1. Chest pain  - Atypical in that in occurred at rest and not exacerbated by activity; Hx puts her at high-risk  - Initial EKG not significantly and initial troponin 0.01; CXR without acute disease  - Received 2 doses of SL NTG and ASA 324 mg en route Telemetry EKG, troponin negative, Right upper quadrant ultrasound to rule out common bile duct stone, liver function, lipase - Lopressor started for HTN; consider d/c if rules-out for ACS; continue Crestor  - This may represent GERD given hx of such; continue PPI 2-D echo to rule out wall motion abnormalities   2. CAD - DES to mLAD in 2005 and subsequent angioplasty for in-stent restenosis  - LHC (February 2012) with LAD 40%, patent stent, prox & mid RCA 30%, and EF 60%  - Evaluating for ACS as above  - Continue Crestor , beta blocker, concerned about aspirin, should she continue this or not  3. Chronic atrial fibrillation  - In rate-controlled a fib on admission  - CHADS-VASc is at least 34 (age x2, gender, CAD, HTN, DM) - Continue AC  with Eliquis 5 mg BID    4. Type II DM  - A1c 6.0% in April 2017  - Managed at home with glipizide only, will hold this while in hospital  - Check CBG with meals and qHS  - Start with low-intensity SSI correctional only and adjust prn   5. Hypertension  - Not on any antihypertensives at time of admission  - There is diastolic HTN in ED  - Low-dose Lopressor started in setting of possible ACS    6. Depression  - Stable, continue Lexapro  7. GERD  - Managed with BID Protonix at home, will continue  - Pepcid 20 mg IV q12h added empirically for treatment of atypical CP       DVT prophylaxsis  eliquis   Code Status:  Full code      Code Status Orders      Family Communication: Discussed in detail with the patient, all imaging results, lab results explained to the patient   Disposition Plan:  Pending further cardiology recommendations    Consultants:  Cardiology  Procedures:  None  Antibiotics: Anti-infectives    None         HPI/Subjective: Patient has pain under her left breast which is reproducible  Objective: Vitals:   03/06/16 1900 03/06/16 1915 03/06/16 2037 03/07/16 0520  BP: (!) 143/114 137/86 129/75 124/70  Pulse: (!) 154 78 82 66  Resp: 20 21 20  20  Temp:   98.4 F (36.9 C) 98.2 F (36.8 C)  TempSrc:   Oral Oral  SpO2: (!) 87% 100% 100% 99%  Weight:   78.5 kg (173 lb 1.6 oz) 78 kg (171 lb 14.4 oz)  Height:   5\' 3"  (1.6 m)     Intake/Output Summary (Last 24 hours) at 03/07/16 1036 Last data filed at 03/07/16 0956  Gross per 24 hour  Intake              360 ml  Output              200 ml  Net              160 ml    Exam:  Examination:  General exam: Appears calm and comfortable  Respiratory system: Clear to auscultation. Respiratory effort normal. Cardiovascular system: S1 & S2 heard, RRR. No JVD, murmurs, rubs, gallops or clicks. No pedal edema. Gastrointestinal system: Abdomen is nondistended, soft and nontender. No  organomegaly or masses felt. Normal bowel sounds heard. Central nervous system: Alert and oriented. No focal neurological deficits. Extremities: Symmetric 5 x 5 power. Skin: No rashes, lesions or ulcers Psychiatry: Judgement and insight appear normal. Mood & affect appropriate.     Data Reviewed: I have personally reviewed following labs and imaging studies  Micro Results No results found for this or any previous visit (from the past 240 hour(s)).  Radiology Reports Dg Chest 2 View  Result Date: 03/06/2016 CLINICAL DATA:  Left-sided chest pain over the last 1 day. EXAM: CHEST  2 VIEW COMPARISON:  Two-view chest x-ray 10/24/2015 FINDINGS: The heart is mildly enlarged. Atherosclerotic calcifications are present at the the aortic arch. Chronic interstitial coarsening is similar the prior exam. Mild bibasilar airspace disease likely reflects atelectasis. There is no edema or effusion to suggest failure. No significant airspace consolidation is present. Degenerate changes of the thoracic spine are stable. The visualized soft tissues and bony thorax are otherwise unremarkable. IMPRESSION: 1. Borderline cardiomegaly without failure. 2. Atherosclerosis of the thoracic aorta. 3. Mild bibasilar airspace disease likely reflects atelectasis. Electronically Signed   By: San Morelle M.D.   On: 03/06/2016 18:14     CBC  Recent Labs Lab 03/06/16 1725  WBC 6.5  HGB 13.6  HCT 41.5  PLT 226  MCV 95.0  MCH 31.1  MCHC 32.8  RDW 13.0    Chemistries   Recent Labs Lab 03/06/16 1725  NA 138  K 4.2  CL 102  CO2 28  GLUCOSE 116*  BUN 12  CREATININE 1.14*  CALCIUM 9.5   ------------------------------------------------------------------------------------------------------------------ estimated creatinine clearance is 32.4 mL/min (by C-G formula based on SCr of 1.14  mg/dL). ------------------------------------------------------------------------------------------------------------------ No results for input(s): HGBA1C in the last 72 hours. ------------------------------------------------------------------------------------------------------------------ No results for input(s): CHOL, HDL, LDLCALC, TRIG, CHOLHDL, LDLDIRECT in the last 72 hours. ------------------------------------------------------------------------------------------------------------------  Recent Labs  03/06/16 2000  TSH 1.499   ------------------------------------------------------------------------------------------------------------------ No results for input(s): VITAMINB12, FOLATE, FERRITIN, TIBC, IRON, RETICCTPCT in the last 72 hours.  Coagulation profile No results for input(s): INR, PROTIME in the last 168 hours.  No results for input(s): DDIMER in the last 72 hours.  Cardiac Enzymes  Recent Labs Lab 03/06/16 2000 03/07/16 0224  TROPONINI <0.03 <0.03   ------------------------------------------------------------------------------------------------------------------ Invalid input(s): POCBNP   CBG:  Recent Labs Lab 03/06/16 2114 03/07/16 0759  GLUCAP 93 120*       Studies: Dg Chest 2 View  Result Date: 03/06/2016 CLINICAL DATA:  Left-sided chest pain over  the last 1 day. EXAM: CHEST  2 VIEW COMPARISON:  Two-view chest x-ray 10/24/2015 FINDINGS: The heart is mildly enlarged. Atherosclerotic calcifications are present at the the aortic arch. Chronic interstitial coarsening is similar the prior exam. Mild bibasilar airspace disease likely reflects atelectasis. There is no edema or effusion to suggest failure. No significant airspace consolidation is present. Degenerate changes of the thoracic spine are stable. The visualized soft tissues and bony thorax are otherwise unremarkable. IMPRESSION: 1. Borderline cardiomegaly without failure. 2. Atherosclerosis of the  thoracic aorta. 3. Mild bibasilar airspace disease likely reflects atelectasis. Electronically Signed   By: San Morelle M.D.   On: 03/06/2016 18:14      Lab Results  Component Value Date   HGBA1C 6.0 (H) 10/24/2015   HGBA1C 6.0 06/07/2014   HGBA1C 5.8 (H) 02/05/2013   Lab Results  Component Value Date   MICROALBUR 0.81 12/08/2012   LDLCALC 76 10/25/2015   CREATININE 1.14 (H) 03/06/2016       Scheduled Meds: . amLODipine  5 mg Oral Daily  . apixaban  5 mg Oral BID  . aspirin EC  81 mg Oral Daily  . escitalopram  10 mg Oral Daily  . gabapentin  600 mg Oral BID  . insulin aspart  0-9 Units Subcutaneous TID WC  . metoprolol tartrate  12.5 mg Oral BID  . multivitamin  1 tablet Oral Daily  . oxybutynin  5 mg Oral Daily  . pantoprazole  40 mg Oral BID  . polyethylene glycol  17 g Oral Daily  . rosuvastatin  5 mg Oral QHS   Continuous Infusions:    LOS: 0 days    Time spent: >30 MINS    [facility]  [facility] Pager [phone]. If 7PM-7AM, please contact night-coverage at [email or web address], password [facility] 03/07/2016, 10:36 AM  LOS: 0 days

## 2016-03-07 NOTE — Progress Notes (Signed)
  Echocardiogram 2D Echocardiogram has been performed.  [name] M 03/07/2016, 3:00 PM

## 2016-03-07 NOTE — Care Management Obs Status (Signed)
[place] NOTIFICATION   Patient Details  Name: [name] MRN: [id number] Date of Birth: Jun 15, 1924   Medicare Observation Status Notification Given:  Yes    Bethena Roys, RN 03/07/2016, 5:07 PM

## 2016-03-07 NOTE — Progress Notes (Signed)
Came to room d/t code blue called- upon entering room pt was awake and talking. No distress noted. Sat 97%, placed on 2 lpm .  [facility] clear diminished.  RN, MD and rapid response at bedside.

## 2016-03-08 DIAGNOSIS — M1712 Unilateral primary osteoarthritis, left knee: Secondary | ICD-10-CM | POA: Diagnosis present

## 2016-03-08 DIAGNOSIS — M25462 Effusion, left knee: Secondary | ICD-10-CM | POA: Diagnosis present

## 2016-03-08 DIAGNOSIS — I4510 Unspecified right bundle-branch block: Secondary | ICD-10-CM | POA: Diagnosis present

## 2016-03-08 DIAGNOSIS — K580 Irritable bowel syndrome with diarrhea: Secondary | ICD-10-CM | POA: Diagnosis present

## 2016-03-08 DIAGNOSIS — R001 Bradycardia, unspecified: Secondary | ICD-10-CM | POA: Diagnosis not present

## 2016-03-08 DIAGNOSIS — I481 Persistent atrial fibrillation: Secondary | ICD-10-CM | POA: Diagnosis not present

## 2016-03-08 DIAGNOSIS — Z955 Presence of coronary angioplasty implant and graft: Secondary | ICD-10-CM | POA: Diagnosis not present

## 2016-03-08 DIAGNOSIS — E1122 Type 2 diabetes mellitus with diabetic chronic kidney disease: Secondary | ICD-10-CM | POA: Diagnosis present

## 2016-03-08 DIAGNOSIS — Z7901 Long term (current) use of anticoagulants: Secondary | ICD-10-CM | POA: Diagnosis not present

## 2016-03-08 DIAGNOSIS — R0902 Hypoxemia: Secondary | ICD-10-CM | POA: Diagnosis not present

## 2016-03-08 DIAGNOSIS — R55 Syncope and collapse: Secondary | ICD-10-CM | POA: Diagnosis not present

## 2016-03-08 DIAGNOSIS — D638 Anemia in other chronic diseases classified elsewhere: Secondary | ICD-10-CM | POA: Diagnosis present

## 2016-03-08 DIAGNOSIS — R1013 Epigastric pain: Secondary | ICD-10-CM

## 2016-03-08 DIAGNOSIS — E785 Hyperlipidemia, unspecified: Secondary | ICD-10-CM | POA: Diagnosis present

## 2016-03-08 DIAGNOSIS — F329 Major depressive disorder, single episode, unspecified: Secondary | ICD-10-CM | POA: Diagnosis present

## 2016-03-08 DIAGNOSIS — I482 Chronic atrial fibrillation: Secondary | ICD-10-CM | POA: Diagnosis present

## 2016-03-08 DIAGNOSIS — M810 Age-related osteoporosis without current pathological fracture: Secondary | ICD-10-CM | POA: Diagnosis present

## 2016-03-08 DIAGNOSIS — E1142 Type 2 diabetes mellitus with diabetic polyneuropathy: Secondary | ICD-10-CM | POA: Diagnosis present

## 2016-03-08 DIAGNOSIS — E669 Obesity, unspecified: Secondary | ICD-10-CM | POA: Diagnosis present

## 2016-03-08 DIAGNOSIS — Z7984 Long term (current) use of oral hypoglycemic drugs: Secondary | ICD-10-CM | POA: Diagnosis not present

## 2016-03-08 DIAGNOSIS — R0789 Other chest pain: Secondary | ICD-10-CM | POA: Diagnosis present

## 2016-03-08 DIAGNOSIS — K219 Gastro-esophageal reflux disease without esophagitis: Secondary | ICD-10-CM | POA: Diagnosis present

## 2016-03-08 DIAGNOSIS — M79606 Pain in leg, unspecified: Secondary | ICD-10-CM | POA: Diagnosis not present

## 2016-03-08 DIAGNOSIS — I4891 Unspecified atrial fibrillation: Secondary | ICD-10-CM | POA: Diagnosis not present

## 2016-03-08 DIAGNOSIS — N183 Chronic kidney disease, stage 3 (moderate): Secondary | ICD-10-CM | POA: Diagnosis present

## 2016-03-08 DIAGNOSIS — F0390 Unspecified dementia without behavioral disturbance: Secondary | ICD-10-CM | POA: Diagnosis present

## 2016-03-08 DIAGNOSIS — R079 Chest pain, unspecified: Secondary | ICD-10-CM | POA: Diagnosis not present

## 2016-03-08 DIAGNOSIS — I2510 Atherosclerotic heart disease of native coronary artery without angina pectoris: Secondary | ICD-10-CM | POA: Diagnosis present

## 2016-03-08 DIAGNOSIS — R072 Precordial pain: Secondary | ICD-10-CM | POA: Diagnosis not present

## 2016-03-08 DIAGNOSIS — I129 Hypertensive chronic kidney disease with stage 1 through stage 4 chronic kidney disease, or unspecified chronic kidney disease: Secondary | ICD-10-CM | POA: Diagnosis present

## 2016-03-08 DIAGNOSIS — M069 Rheumatoid arthritis, unspecified: Secondary | ICD-10-CM | POA: Diagnosis present

## 2016-03-08 LAB — GLUCOSE, CAPILLARY
GLUCOSE-CAPILLARY: 168 mg/dL — AB (ref 65–99)
Glucose-Capillary: 126 mg/dL — ABNORMAL HIGH (ref 65–99)
Glucose-Capillary: 137 mg/dL — ABNORMAL HIGH (ref 65–99)
Glucose-Capillary: 151 mg/dL — ABNORMAL HIGH (ref 65–99)

## 2016-03-08 NOTE — Consult Note (Signed)
ELECTROPHYSIOLOGY CONSULT NOTE    Patient ID: [name] MRN: [id number], DOB/AGE: 09/05/1923 82 y.o.  Admit date: 03/06/2016 Date of Consult: 03/08/2016  Primary Physician: Chesley Noon, MD Primary Cardiologist: Dr. Harrington Challenger Requesting MD: Dr. Harrington Challenger  Reason for Consultation:  syncope  HPI: [name] is a 82 y.o. female with PMhx of CAD ( (DES to mLAD 2005, cutting balloon angioplasty due to ISR in 2006), Permanent AFib, DM, HTN, HLD, GERD, IBS, spinal stenosis, anemia, multinodular goiter, known RBBB, CKD stage III, was brought to [facility] by EMS secondary to CP.  She reported that she developed a sensation of chest pain in her left breast described as a dull ache. She had mild dyspnea. Denies diaphoresis, nausea, or vomiting. Had loose stools shortly after. She did not try anything at home for the pain and nothing made it worse. After several hours of persistent discomfort, she called EMS. She received 4 baby ASA, as well as 2 SL NTG without significant relief. She says it was only until she went for her CXR did symptoms finally ease off spontaneously (6pm).    She was seen in consult by cardiology service, dr. Harrington Challenger who did not suspect a cardiac etiology and has r/o with neg Trop x3.  She was apparently started on BB here for her HTN apparently by H&P note receiving a total of 2 dose of metoprolol 12.5mg , last dose 0849 yesterday.  The patient last evening was on the bedside commode urinating when she had a syncopal event, she was placed back in bed an given brief compressions with ROSC, telemetry noted pause of 7.8 seconds  She was seen by cardiology NP, suspected micturation syncope/vagal event, her BB held and EP asked to evaluate.  The patient feels very well this morning, denies any ongoing c/o CP since her arrival, no palpitations, no SOB.  She reports nothing like last PM has ever happened to her before.  She felt lightheaded a second or so prior to fainting.  She has never  had any near syncope or syncope historically.  LABS:  03/07/16: K+ 4.1 BUN/Creat 14/1.05 03/06/16: Trop <0.03 x3 H/H 13/41 WBC 6.5 plts 226 TSH 1.499  .  Past Medical History:  Diagnosis Date  . 1St degree AV block   . Acute renal failure ([place])    due to dehydration-Sept 2009; Normal creat 1.2 on fu 05/13/2009  . Allergic rhinitis   . Anemia    NOS chronic disease. Hgb 11.6gm% 04/14/2009  . Bradycardia   . CAD (coronary artery disease)    a. s/p DES mLAD 2005. b. Cutting balloon angioplasty for ISR 2006. c. LHC 08/2010 nonobstructive. d. Myoview 06/2012: small anteroapical infarct/mod inferolat wall infarct but no ischemia, EF 75%.  . Cancer ([place])    on nose and had it removed  . Chronic anticoagulation   . CKD (chronic kidney disease), stage III   . DEMENTIA    slight  . Diabetes mellitus type II   . Diverticulitis of colon   . DM neuropathy, type II diabetes mellitus ([place])   . GERD (gastroesophageal reflux disease)   . HTN (hypertension)   . Hx of colonoscopy   . Hyperlipidemia   . IBS (irritable bowel syndrome)   . Multinodular goiter   . Obesity    BMI-37  . Orthostatic hypotension   . Osteoarthritis    lower back  . Osteoporosis   . Pneumonia   . PVC's (premature ventricular contractions)    a. seen on  telemetry 02/2016.  Marland Kitchen RBBB   . Rheumatoid arthritis ([place])   . Spinal stenosis    djd lumbar     Surgical History:  Past Surgical History:  Procedure Laterality Date  . ABDOMINAL HYSTERECTOMY    . bilateral arthroscopic knee surgery    . carpel tunnel wrist rt    . CHOLECYSTECTOMY    . CORONARY ANGIOPLASTY     stent  . NASAL SINUS SURGERY     x2  . stent surgery       Prescriptions Prior to Admission  Medication Sig Dispense Refill Last Dose  . albuterol (PROVENTIL HFA;VENTOLIN HFA) 108 (90 Base) MCG/ACT inhaler Inhale 2 puffs into the lungs every 6 (six) hours as needed for wheezing or shortness of breath.    03/06/2016 at Unknown time  .  alendronate (FOSAMAX) 70 MG tablet TAKE 1 TABLET BY MOUTH EVERY 7 DAYS ON TUESDAYS WITH A FULL GLASS OF WATER & ON AN EMPTY STOMACH 4 tablet 6 02/28/2016  . amLODipine (NORVASC) 5 MG tablet Take 5 mg by mouth daily.   03/06/2016 at Unknown time  . apixaban (ELIQUIS) 5 MG TABS tablet Take 1 tablet (5 mg total) by mouth 2 (two) times daily.   03/06/2016 at [place]  . Calcium Carbonate-Vit D-Min (CALCIUM 1200 PO) Take 1 tablet by mouth daily.    03/06/2016 at Unknown time  . fluticasone (FLONASE) 50 MCG/ACT nasal spray Place 1 spray into both nostrils daily as needed for allergies.    2 months  . gabapentin (NEURONTIN) 600 MG tablet Take 600 mg by mouth 2 (two) times daily.    03/06/2016 at Unknown time  . glipiZIDE (GLUCOTROL XL) 5 MG 24 hr tablet Take 5 mg by mouth every evening.    03/04/2016 at Unknown time  . Multiple Vitamins-Minerals (CENTRUM SILVER PO) Take 1 tablet by mouth daily.    03/06/2016 at Unknown time  . Multiple Vitamins-Minerals (PRESERVISION AREDS PO) Take 1 tablet by mouth 2 (two) times daily.   03/06/2016 at Unknown time  . nitroGLYCERIN (NITROSTAT) 0.4 MG SL tablet Place 0.4 mLs under the tongue every 5 (five) minutes as needed for chest pain (Max 3 doses).    03/06/2016 at Unknown time  . ondansetron (ZOFRAN) 8 MG tablet Take 1 tablet (8 mg total) by mouth every 8 (eight) hours as needed for nausea or vomiting. 20 tablet 0 Past Month at Unknown time  . oxybutynin (DITROPAN) 5 MG tablet Take 5 mg by mouth daily.   03/06/2016 at Unknown time  . pantoprazole (PROTONIX) 40 MG tablet Take 1 tablet (40 mg total) by mouth 2 (two) times daily. 60 tablet 11 03/06/2016 at Unknown time  . rosuvastatin (CRESTOR) 5 MG tablet Take 5 mg by mouth at bedtime.   03/05/2016 at Unknown time  . traMADol (ULTRAM) 50 MG tablet Take 100 mg by mouth daily as needed for moderate pain.    03/05/2016 at Unknown time    Inpatient Medications:  . amLODipine  5 mg Oral Daily  . apixaban  5 mg Oral BID  . aspirin EC  81 mg  Oral Daily  . atropine  0.5 mg Intravenous Once  . gabapentin  600 mg Oral BID  . insulin aspart  0-9 Units Subcutaneous TID WC  . multivitamin  1 tablet Oral Daily  . oxybutynin  5 mg Oral Daily  . pantoprazole  40 mg Oral BID  . polyethylene glycol  17 g Oral Daily  . rosuvastatin  5  mg Oral QHS    Allergies:  Allergies  Allergen Reactions  . Amoxicillin Other (See Comments)    UNKNOWN    Social History   Social History  . Marital status: Married    Spouse name: N/A  . Number of children: N/A  . Years of education: N/A   Occupational History  . retired    Social History Main Topics  . Smoking status: Never Smoker  . Smokeless tobacco: Never Used  . Alcohol use No  . Drug use: No  . Sexual activity: No   Other Topics Concern  . Not on file   Social History Narrative  . No narrative on file     Family History  Problem Relation Age of Onset  . Heart attack Father   . Breast cancer Sister   . Heart disease Brother   . Heart attack Son   . Heart attack Daughter   . Colon cancer Neg Hx   . Stroke Neg Hx      Review of Systems: All other systems reviewed and are otherwise negative except as noted above.  Physical Exam: Vitals:   03/07/16 2018 03/08/16 0032 03/08/16 0516 03/08/16 0735  BP: (!) 144/78 114/74 (!) 111/54 118/70  Pulse: 66 77 87 84  Resp: 20 15 16    Temp: 98 F (36.7 C) 98.7 F (37.1 C) 98.1 F (36.7 C) 98.2 F (36.8 C)  TempSrc: Oral Oral Oral Oral  SpO2: 100% 98% 99% 99%  Weight:   170 lb 6.4 oz (77.3 kg)   Height:        GEN- The patient is well appearing, alert and oriented x 3 today.   HEENT: normocephalic, atraumatic; sclera clear, conjunctiva pink; hearing intact; oropharynx clear; neck supple, no JVP Lymph- no cervical lymphadenopathy Lungs- Clear to ausculation bilaterally, normal work of breathing.  No wheezes, rales, rhonchi Heart- Irregular rate and rhythm, no murmurs, rubs or gallops, PMI not laterally displaced GI-  soft, non-tender, non-distended, bowel sounds present Extremities- no clubbing, cyanosis, or edema MS- no significant deformity or atrophy Skin- warm and dry, no rash or lesion Psych- euthymic mood, full affect Neuro- no gross deficits observed  Labs:   Lab Results  Component Value Date   WBC 6.5 03/06/2016   HGB 13.6 03/06/2016   HCT 41.5 03/06/2016   MCV 95.0 03/06/2016   PLT 226 03/06/2016    Recent Labs Lab 03/07/16 1116  NA 139  K 4.1  CL 105  CO2 27  BUN 14  CREATININE 1.05*  CALCIUM 9.3  PROT 6.3*  BILITOT 0.9  ALKPHOS 56  ALT 17  AST 20  GLUCOSE 136*      Radiology/Studies:  Dg Chest 2 View Result Date: 03/06/2016 CLINICAL DATA:  Left-sided chest pain over the last 1 day. EXAM: CHEST  2 VIEW COMPARISON:  Two-view chest x-ray 10/24/2015 FINDINGS: The heart is mildly enlarged. Atherosclerotic calcifications are present at the the aortic arch. Chronic interstitial coarsening is similar the prior exam. Mild bibasilar airspace disease likely reflects atelectasis. There is no edema or effusion to suggest failure. No significant airspace consolidation is present. Degenerate changes of the thoracic spine are stable. The visualized soft tissues and bony thorax are otherwise unremarkable. IMPRESSION: 1. Borderline cardiomegaly without failure. 2. Atherosclerosis of the thoracic aorta. 3. Mild bibasilar airspace disease likely reflects atelectasis. Electronically Signed   By: San Morelle M.D.   On: 03/06/2016 18:14   US Abdomen Limited Ruq Result Date: 03/07/2016 CLINICAL DATA:  Abdominal pain for 1 day, status postcholecystectomy EXAM: US ABDOMEN LIMITED - RIGHT UPPER QUADRANT COMPARISON:  CT scan 12/30/2015 FINDINGS: Gallbladder: Surgically absent Common bile duct: Diameter: 5.4 mm in diameter within normal limits Liver: No focal lesion identified. Within normal limits in parenchymal echogenicity. No intrahepatic biliary ductal dilatation. IMPRESSION: 1. Surgical  absent gallbladder.  Normal CBD.  No focal hepatic mass. Electronically Signed   By: Lahoma Crocker M.D.   On: 03/07/2016 15:29    EKG: 03/07/16 post syncope, AFib 47bpm, RBBB TELEMETRY:  AFib, CVR, pause of 7.8seconds last evening followed by marked bradycardia lasting approx 20seconds  03/07/16: Echocardiogram Study Conclusions - Left ventricle: The cavity size was normal. Wall thickness was   normal. Systolic function was normal. The estimated ejection   fraction was in the range of 60% to 65%. The study is not   technically sufficient to allow evaluation of LV diastolic   function. - Aortic valve: There was mild regurgitation. - Left atrium: The atrium was mildly dilated. - Atrial septum: There was a patent foramen ovale. - Pulmonary arteries: PA peak pressure: 35 mm Hg (S).   Assessment and Plan:   1. Syncope     V. Pause, of 7.89 seconds, followed by profound bradycardia 20's transiently      Possibly vagally mediated occurring during urination     No history of syncope or near syncope     Cards note mentions nocturnal bradycardia in 40's transiently though HR generally appears 60's without significant bradycardia that I can find outside of the noted event.      Agree with holding BB Agree, likely vagal event, the patient states she was "holding it"  for a few minutes waiting to get to the commode, and once on the commode started to urinate and fainted.  Appeared to become brady prior to the pause and had profound bradycardia afterwards for approx 20seconds.  No pacer for now given no prior hx of syncope, and likely a vagal event, would observe on telemetry another 24 hours.   2. CP     R/o for ACS, not felt to be cardiac  3. Permanent AFib     CHA2DS2Vasc is at least 5 on Eliquis  4. HTN     stable  Signed, Tommye Standard, PA-C 03/08/2016 10:09 AM  EP Attending  Patient seen and examined. Agree with above. The patient is a pleasant elderly woman with chronic atrial fib,  admitted with atypical chest pain who had the urge to urinate and passed out, with a 7 second pause followed by bradycardia which eventually resolved. She had been placed on beta blockers. The patient has never passed out in the past and her exam demonstrates a regular rate and rhythm, with clear lungs, and no edema. Neurologically she is intact. Her ECG demonstrates atrial fibrillation with a controlled ventricular response. Telemetry is as noted above. Assessment and plan 1. Symptomatic bradycardia - the patient's episode occurred in the setting of vagal mediation as well as recent initiation of beta blocker therapy. She has had one episode of syncope in her lifetime. I've initially recommended a period of watchful waiting. If she has recurrent episodes, permanent pacemaker would be recommended. Hopefully discontinuation of her beta blocker and avoiding the urge to urinate will prevent her from passing out further. 2. Atrial fibrillation. Her ventricular rate appears to be well-controlled. She will continue her current medical therapy. She will remain on systemic anticoagulation. 3. Chest pain - her chest pain is thought to  be noncardiac. No additional workup at this time.  Cristopher Peru, M.D.

## 2016-03-08 NOTE — Progress Notes (Signed)
    Subjective:  Feels weak. Otherwise no specific complaints. No palpitations or dyspnea.  Objective:  Vital Signs in the last 24 hours: Temp:  [98 F (36.7 C)-98.7 F (37.1 C)] 98.3 F (36.8 C) (08/17 1119) Pulse Rate:  [42-87] 74 (08/17 1119) Resp:  [15-20] 16 (08/17 0516) BP: (91-144)/(54-78) 108/67 (08/17 1119) SpO2:  [96 %-100 %] 96 % (08/17 1119) Weight:  [77.3 kg (170 lb 6.4 oz)] 77.3 kg (170 lb 6.4 oz) (08/17 0516)  Intake/Output from previous day: 08/16 0701 - 08/17 0700 In: 240 [P.O.:240] Out: 1250 [Urine:1250]  Physical Exam: Pt is alert and oriented, pleasant elderly woman in NAD HEENT: normal Neck: JVP - normal Lungs: CTA bilaterally CV: Irregularly irregular without murmur or gallop Abd: soft, NT, Positive BS, no hepatomegaly Ext: no C/C/E, distal pulses intact and equal Skin: warm/dry no rash   Lab Results:  Recent Labs  03/06/16 1725  WBC 6.5  HGB 13.6  PLT 226    Recent Labs  03/06/16 1725 03/07/16 1116  NA 138 139  K 4.2 4.1  CL 102 105  CO2 28 27  GLUCOSE 116* 136*  BUN 12 14  CREATININE 1.14* 1.05*    Recent Labs  03/07/16 0224 03/07/16 1116  TROPONINI <0.03 <0.03    Tele: Atrial fibrillation. Heart rate currently in the 60s. Telemetry reviewed from yesterday with a period of marked bradycardia/slow atrial fibrillation with a 7 second pause followed by 3 more 4 second pauses.   Assessment/Plan:  1. Chest pain, atypical: Normal nuclear scan in April 2017. No further assessment recommended at this time. This is likely noncardiac.  2. Chronic atrial fibrillation with symptomatic pauses: Beta blocker has been discontinued after yesterday's bradycardic event. Notes reviewed and this may have been a vagal event. We'll monitor another 24 hours on telemetry. Formal EP evaluation pending.   Sherren Mocha, M.D. 03/08/2016, 12:23 PM

## 2016-03-08 NOTE — Progress Notes (Addendum)
[facility] PROGRESS NOTE  DAMBER BARWICK [id number] DOB: September 13, 1923 DOA: 03/06/2016   PCP: Chesley Noon, MD     Assessment/Plan: Active Problems:   Bradycardia   GERD   Coronary artery disease   Atrial fibrillation, permanent   Diabetes mellitus type II, non insulin dependent (HCC)   HTN (hypertension)   Chest pain, negative MI, negative Nuc, may be GI   Depression   Chest pain at rest   PVC's (premature ventricular contractions)   82 y.o. female with history of CAD (DES to mLAD 2005, cutting balloon angioplasty due to ISR in 2006), chronic-appearing atrial fib (per EKGs back to 2014), DM2, HTN, HLP, anemia, GERD, IBS, spinal stenosis, multinodular goiter, RBBB, CKD stage III per labs (baseline Cr 1-1.1) who presented to [facility] with chest pain. Per review of chart, she has history of atypical chest pain. Last nuc 10/2015 for CP was negative, EF 80%.  Assessment and plan  Chest pain  somewhat atypical, no objective evidence of ischemia thus far. Neg nuc 10/2015. - Initial EKG not significantly and initial troponin 0.01; CXR without acute disease  - Received 2 doses of SL NTG and ASA 324 mg en route Telemetry EKG, troponin negative, Right upper quadrant ultrasound within normal limits-liver function, lipase normal  Lopressor started but discontinued due to bradycardia, LVEF normal with no definite wall motion abnormalities  continue Crestor . Suspected GERD,does have history of reflux that does not appear controlled   ,continue PPI    code blue/7.8 second pause Associated with loss of consciousness Brief CPR was performed as the patient became hypotensive Otherwise baseline rhythm is atrial fibrillation Beta blocker was discontinued and the patient was ordered to receive one dose of atropine, but never given We'll monitor another 24 hours on telemetry. Formal EP evaluation pending    CAD - DES to mLAD in 2005 and subsequent angioplasty for in-stent  restenosis  - LHC (February 2012) with LAD 40%, patent stent, prox & mid RCA 30%, and EF 60%  - Evaluating for ACS as above  - Continue Crestor , beta blocker, concerned about aspirin, should she continue this or not   Chronic atrial fibrillation  - In rate-controlled a fib on admission , EP consult pending - CHADS-VASc is at least 15 (age x2, gender, CAD, HTN, DM) - Continue AC with Eliquis 5 mg BID     Type II DM  - A1c 6.0% in April 2017  - Managed at home with glipizide only, will hold this while in hospital  - Check CBG with meals and qHS  - Start with low-intensity SSI correctional only and adjust prn    Hypertension  - Not on any antihypertensives at time of admission  - There is diastolic HTN in ED  - Low-dose Lopressor started in setting of possible ACS      Depression  - Stable, continue Lexapro  7. GERD  - Managed with BID Protonix at home, will continue  - Pepcid 20 mg IV q12h added empirically for treatment of atypical CP       DVT prophylaxsis  eliquis   Code Status:  Full code      Code Status Orders      Family Communication: Discussed in detail with the patient, all imaging results, lab results explained to the patient   Disposition Plan:  Pending further cardiology recommendations, ep to evaluate     Consultants:  Cardiology  Procedures:  None  Antibiotics: Anti-infectives  None         HPI/Subjective: No further episodes of syncope   Objective: Vitals:   03/07/16 2018 03/08/16 0032 03/08/16 0516 03/08/16 0735  BP: (!) 144/78 114/74 (!) 111/54 118/70  Pulse: 66 77 87 84  Resp: 20 15 16    Temp: 98 F (36.7 C) 98.7 F (37.1 C) 98.1 F (36.7 C) 98.2 F (36.8 C)  TempSrc: Oral Oral Oral Oral  SpO2: 100% 98% 99% 99%  Weight:   77.3 kg (170 lb 6.4 oz)   Height:        Intake/Output Summary (Last 24 hours) at 03/08/16 0751 Last data filed at 03/08/16 0100  Gross per 24 hour  Intake              240 ml  Output              1250 ml  Net            -1010 ml    Exam:  Examination:  General exam: Appears calm and comfortable  Respiratory system: Clear to auscultation. Respiratory effort normal. Cardiovascular system: S1 & S2 heard, RRR. No JVD, murmurs, rubs, gallops or clicks. No pedal edema. Gastrointestinal system: Abdomen is nondistended, soft and nontender. No organomegaly or masses felt. Normal bowel sounds heard. Central nervous system: Alert and oriented. No focal neurological deficits. Extremities: Symmetric 5 x 5 power. Skin: No rashes, lesions or ulcers Psychiatry: Judgement and insight appear normal. Mood & affect appropriate.     Data Reviewed: I have personally reviewed following labs and imaging studies  Micro Results No results found for this or any previous visit (from the past 240 hour(s)).  Radiology Reports Dg Chest 2 View  Result Date: 03/06/2016 CLINICAL DATA:  Left-sided chest pain over the last 1 day. EXAM: CHEST  2 VIEW COMPARISON:  Two-view chest x-ray 10/24/2015 FINDINGS: The heart is mildly enlarged. Atherosclerotic calcifications are present at the the aortic arch. Chronic interstitial coarsening is similar the prior exam. Mild bibasilar airspace disease likely reflects atelectasis. There is no edema or effusion to suggest failure. No significant airspace consolidation is present. Degenerate changes of the thoracic spine are stable. The visualized soft tissues and bony thorax are otherwise unremarkable. IMPRESSION: 1. Borderline cardiomegaly without failure. 2. Atherosclerosis of the thoracic aorta. 3. Mild bibasilar airspace disease likely reflects atelectasis. Electronically Signed   By: San Morelle M.D.   On: 03/06/2016 18:14   US Abdomen Limited Ruq  Result Date: 03/07/2016 CLINICAL DATA:  Abdominal pain for 1 day, status postcholecystectomy EXAM: US ABDOMEN LIMITED - RIGHT UPPER QUADRANT COMPARISON:  CT scan 12/30/2015 FINDINGS: Gallbladder: Surgically  absent Common bile duct: Diameter: 5.4 mm in diameter within normal limits Liver: No focal lesion identified. Within normal limits in parenchymal echogenicity. No intrahepatic biliary ductal dilatation. IMPRESSION: 1. Surgical absent gallbladder.  Normal CBD.  No focal hepatic mass. Electronically Signed   By: Lahoma Crocker M.D.   On: 03/07/2016 15:29     CBC  Recent Labs Lab 03/06/16 1725  WBC 6.5  HGB 13.6  HCT 41.5  PLT 226  MCV 95.0  MCH 31.1  MCHC 32.8  RDW 13.0    Chemistries   Recent Labs Lab 03/06/16 1725 03/07/16 1116  NA 138 139  K 4.2 4.1  CL 102 105  CO2 28 27  GLUCOSE 116* 136*  BUN 12 14  CREATININE 1.14* 1.05*  CALCIUM 9.5 9.3  AST  --  20  ALT  --  17  ALKPHOS  --  56  BILITOT  --  0.9   ------------------------------------------------------------------------------------------------------------------ estimated creatinine clearance is 35.1 mL/min (by C-G formula based on SCr of 1.05 mg/dL). ------------------------------------------------------------------------------------------------------------------ No results for input(s): HGBA1C in the last 72 hours. ------------------------------------------------------------------------------------------------------------------ No results for input(s): CHOL, HDL, LDLCALC, TRIG, CHOLHDL, LDLDIRECT in the last 72 hours. ------------------------------------------------------------------------------------------------------------------  Recent Labs  03/06/16 2000  TSH 1.499   ------------------------------------------------------------------------------------------------------------------ No results for input(s): VITAMINB12, FOLATE, FERRITIN, TIBC, IRON, RETICCTPCT in the last 72 hours.  Coagulation profile No results for input(s): INR, PROTIME in the last 168 hours.  No results for input(s): DDIMER in the last 72 hours.  Cardiac Enzymes  Recent Labs Lab 03/06/16 2000 03/07/16 0224 03/07/16 1116   TROPONINI <0.03 <0.03 <0.03   ------------------------------------------------------------------------------------------------------------------ Invalid input(s): POCBNP   CBG:  Recent Labs Lab 03/07/16 1141 03/07/16 1635 03/07/16 1855 03/07/16 2017 03/08/16 0733  GLUCAP 131* 119* 196* 146* 126*       Studies: Dg Chest 2 View  Result Date: 03/06/2016 CLINICAL DATA:  Left-sided chest pain over the last 1 day. EXAM: CHEST  2 VIEW COMPARISON:  Two-view chest x-ray 10/24/2015 FINDINGS: The heart is mildly enlarged. Atherosclerotic calcifications are present at the the aortic arch. Chronic interstitial coarsening is similar the prior exam. Mild bibasilar airspace disease likely reflects atelectasis. There is no edema or effusion to suggest failure. No significant airspace consolidation is present. Degenerate changes of the thoracic spine are stable. The visualized soft tissues and bony thorax are otherwise unremarkable. IMPRESSION: 1. Borderline cardiomegaly without failure. 2. Atherosclerosis of the thoracic aorta. 3. Mild bibasilar airspace disease likely reflects atelectasis. Electronically Signed   By: San Morelle M.D.   On: 03/06/2016 18:14   US Abdomen Limited Ruq  Result Date: 03/07/2016 CLINICAL DATA:  Abdominal pain for 1 day, status postcholecystectomy EXAM: US ABDOMEN LIMITED - RIGHT UPPER QUADRANT COMPARISON:  CT scan 12/30/2015 FINDINGS: Gallbladder: Surgically absent Common bile duct: Diameter: 5.4 mm in diameter within normal limits Liver: No focal lesion identified. Within normal limits in parenchymal echogenicity. No intrahepatic biliary ductal dilatation. IMPRESSION: 1. Surgical absent gallbladder.  Normal CBD.  No focal hepatic mass. Electronically Signed   By: Lahoma Crocker M.D.   On: 03/07/2016 15:29      Lab Results  Component Value Date   HGBA1C 6.0 (H) 10/24/2015   HGBA1C 6.0 06/07/2014   HGBA1C 5.8 (H) 02/05/2013   Lab Results  Component Value Date    MICROALBUR 0.81 12/08/2012   LDLCALC 76 10/25/2015   CREATININE 1.05 (H) 03/07/2016       Scheduled Meds: . amLODipine  5 mg Oral Daily  . apixaban  5 mg Oral BID  . aspirin EC  81 mg Oral Daily  . atropine  0.5 mg Intravenous Once  . gabapentin  600 mg Oral BID  . insulin aspart  0-9 Units Subcutaneous TID WC  . multivitamin  1 tablet Oral Daily  . oxybutynin  5 mg Oral Daily  . pantoprazole  40 mg Oral BID  . polyethylene glycol  17 g Oral Daily  . rosuvastatin  5 mg Oral QHS   Continuous Infusions:    LOS: 0 days    Time spent: >30 MINS    [facility]  [facility] Pager [phone]. If 7PM-7AM, please contact night-coverage at [email or web address], password [facility] 03/08/2016, 7:51 AM  LOS: 0 days

## 2016-03-09 DIAGNOSIS — I481 Persistent atrial fibrillation: Secondary | ICD-10-CM

## 2016-03-09 LAB — CBC
HCT: 41.3 % (ref 36.0–46.0)
Hemoglobin: 13.6 g/dL (ref 12.0–15.0)
MCH: 31.5 pg (ref 26.0–34.0)
MCHC: 32.9 g/dL (ref 30.0–36.0)
MCV: 95.6 fL (ref 78.0–100.0)
PLATELETS: 204 10*3/uL (ref 150–400)
RBC: 4.32 MIL/uL (ref 3.87–5.11)
RDW: 13.1 % (ref 11.5–15.5)
WBC: 8.8 10*3/uL (ref 4.0–10.5)

## 2016-03-09 LAB — COMPREHENSIVE METABOLIC PANEL
ALT: 15 U/L (ref 14–54)
AST: 19 U/L (ref 15–41)
Albumin: 3.5 g/dL (ref 3.5–5.0)
Alkaline Phosphatase: 54 U/L (ref 38–126)
Anion gap: 8 (ref 5–15)
BUN: 27 mg/dL — ABNORMAL HIGH (ref 6–20)
CHLORIDE: 103 mmol/L (ref 101–111)
CO2: 26 mmol/L (ref 22–32)
Calcium: 9 mg/dL (ref 8.9–10.3)
Creatinine, Ser: 1.23 mg/dL — ABNORMAL HIGH (ref 0.44–1.00)
GFR, EST AFRICAN AMERICAN: 43 mL/min — AB (ref >60)
GFR, EST NON AFRICAN AMERICAN: 37 mL/min — AB (ref >60)
Glucose, Bld: 130 mg/dL — ABNORMAL HIGH (ref 65–99)
POTASSIUM: 4.2 mmol/L (ref 3.5–5.1)
Sodium: 137 mmol/L (ref 135–145)
Total Bilirubin: 0.7 mg/dL (ref 0.3–1.2)
Total Protein: 6 g/dL — ABNORMAL LOW (ref 6.5–8.1)

## 2016-03-09 LAB — GLUCOSE, CAPILLARY
GLUCOSE-CAPILLARY: 127 mg/dL — AB (ref 65–99)
GLUCOSE-CAPILLARY: 166 mg/dL — AB (ref 65–99)
GLUCOSE-CAPILLARY: 178 mg/dL — AB (ref 65–99)
GLUCOSE-CAPILLARY: 172 mg/dL — AB (ref 65–99)

## 2016-03-09 LAB — D-DIMER, QUANTITATIVE: D-Dimer, Quant: 1.37 ug/mL-FEU — ABNORMAL HIGH (ref 0.00–0.50)

## 2016-03-09 MED ORDER — FUROSEMIDE 10 MG/ML IJ SOLN
40.0000 mg | Freq: Once | INTRAMUSCULAR | Status: AC
  Administered 2016-03-09: 40 mg via INTRAVENOUS
  Filled 2016-03-09: qty 4

## 2016-03-09 MED ORDER — GLYBURIDE 2.5 MG PO TABS
2.5000 mg | ORAL_TABLET | Freq: Two times a day (BID) | ORAL | Status: DC
  Administered 2016-03-09 – 2016-03-13 (×6): 2.5 mg via ORAL
  Filled 2016-03-09 (×10): qty 1

## 2016-03-09 NOTE — Progress Notes (Signed)
Patient had a 2.18 seconds pause. She is asymptomatic. Will continue to monitor.

## 2016-03-09 NOTE — Evaluation (Signed)
Physical Therapy Evaluation Patient Details Name: [name] MRN: [id number] DOB: August 08, 1923 Today's Date: 03/09/2016   History of Present Illness  82 y.o. female admitted for L sided chest pain, dyspnea, and clammy sensation. In hospital, pt had brief LOC and severe bradycardia while toileting - CPR performed and pt became responsive. PMH significant for HTN, orthostatic hypotension, HLD, CAD, PAF, 1st degree AV block, anemia, DM type II w/ neuropathy, GERD, IBS, and RA.  Clinical Impression  Patient presents with generalized weakness, fatigue, dyspnea on exertion, drop in Sp02 with mobility and overall decreased endurance/safety s/p above. Tolerated gait training with Min A for balance/safety and needed to sit due to weakness and UEs giving out. Sp02 dropped to 76% on RA. Pt not safe to return home alone at this time. Will follow acutely to maximize independence and mobility prior to return home. May be safe to return home over the weekend with initial 24/7 supervision.     Follow Up Recommendations SNF;Supervision for mobility/OOB    Equipment Recommendations  None recommended by PT    Recommendations for Other Services OT consult     Precautions / Restrictions Precautions Precautions: Fall Restrictions Weight Bearing Restrictions: No      Mobility  Bed Mobility Overal bed mobility: Needs Assistance Bed Mobility: Supine to Sit;Sit to Supine     Supine to sit: Min guard Sit to supine: Min guard   General bed mobility comments: Min guard for safety. Use of rail.   Transfers Overall transfer level: Needs assistance Equipment used: Rolling walker (2 wheeled) Transfers: Sit to/from Stand Sit to Stand: Min guard Stand pivot transfers: Min guard       General transfer comment: Min guard to stand froma ll surfaces with multiple attempts needed to stand. Stood from [employer], from chair x1, from [facility] x1  Ambulation/Gait Ambulation/Gait assistance: Min guard Ambulation  Distance (Feet): 100 Feet Assistive device: Rolling walker (2 wheeled) Gait Pattern/deviations: Step-through pattern;Decreased stride length;Trunk flexed Gait velocity: decreased Gait velocity interpretation: <1.8 ft/sec, indicative of risk for recurrent falls General Gait Details: Slow, unsteady gait. BUEs getting weak and bil knee instability noted. Not able to finish walk needing to sit  Stairs            Wheelchair Mobility    Modified Rankin (Stroke Patients Only)       Balance Overall balance assessment: Needs assistance Sitting-balance support: Feet supported;No upper extremity supported Sitting balance-Leahy Scale: Fair     Standing balance support: During functional activity Standing balance-Leahy Scale: Poor Standing balance comment: Reliant on BUEs for support.                             Pertinent Vitals/Pain Pain Assessment: 0-10 Pain Score: 8  Pain Location: right foot Pain Descriptors / Indicators: Sore Pain Intervention(s): Monitored during session;Limited activity within patient's tolerance;Repositioned    Home Living Family/patient expects to be discharged to:: Private residence Living Arrangements: Alone Available Help at Discharge: Family;Available PRN/intermittently Type of Home: House Home Access: Stairs to enter Entrance Stairs-Rails: Right Entrance Stairs-Number of Steps: 2 Home Layout: Two level;Able to live on main level with bedroom/bathroom Home Equipment: Gilford Rile - 2 wheels;Bedside commode;Cane - single point;Shower seat;Grab bars - tub/shower;Hand held shower head      Prior Function Level of Independence: Independent with assistive device(s)         Comments: Uses RW in the house and SPC outside; independent with all ADL/IADLs and  drives. However, pt reports significant reduction in energy and motivation to do anything for past 6 months     Hand Dominance   Dominant Hand: Right    Extremity/Trunk Assessment    Upper Extremity Assessment: Defer to OT evaluation           Lower Extremity Assessment: Generalized weakness (pain in right foot)      Cervical / Trunk Assessment: Kyphotic  Communication   Communication: No difficulties  Cognition Arousal/Alertness: Awake/alert Behavior During Therapy: WFL for tasks assessed/performed Overall Cognitive Status: Within Functional Limits for tasks assessed                      General Comments General comments (skin integrity, edema, etc.): Sp02 dropped to 76% on RA. Resolved to 93%.    Exercises        Assessment/Plan    PT Assessment Patient needs continued PT services  PT Diagnosis Difficulty walking;Generalized weakness   PT Problem List Decreased strength;Decreased mobility;Decreased balance;Cardiopulmonary status limiting activity;Decreased activity tolerance;Pain  PT Treatment Interventions Therapeutic activities;Gait training;Therapeutic exercise;Patient/family education;Stair training;Balance training;Functional mobility training   PT Goals (Current goals can be found in the Care Plan section) Acute Rehab PT Goals Patient Stated Goal: to have more energy to do normal daily activities PT Goal Formulation: With patient Time For Goal Achievement: 03/23/16 Potential to Achieve Goals: Fair    Frequency Min 3X/week   Barriers to discharge Decreased caregiver support lives alone    Co-evaluation               End of Session Equipment Utilized During Treatment: Gait belt Activity Tolerance: Treatment limited secondary to medical complications (Comment) (drop in Sp02) Patient left: in bed;with call bell/phone within reach;with bed alarm set Nurse Communication: Mobility status         Time: 1421-1445 PT Time Calculation (min) (ACUTE ONLY): 24 min   Charges:   PT Evaluation $PT Eval Moderate Complexity: 1 Procedure PT Treatments $Gait Training: 8-22 mins   PT G Codes:        Glendoris Nodarse A  Keylie Beavers 03/09/2016, 2:48 PM Wray Kearns, [place], DPT [phone]

## 2016-03-09 NOTE — Discharge Summary (Addendum)
Physician Discharge Summary  [name] MRN: [id number] DOB/AGE: [birth year] 82 y.o.  PCP: Chesley Noon, MD   Admit date: 03/06/2016 Discharge date: 03/09/2016  Discharge Diagnoses:    Active Problems:   Bradycardia   GERD   Coronary artery disease   Atrial fibrillation, permanent   Diabetes mellitus type II, non insulin dependent (HCC)   HTN (hypertension)   Chest pain, negative MI, negative Nuc, may be GI   Depression   Chest pain at rest   PVC's (premature ventricular contractions)    ADDENDUM discharge cancelled due to sao2 in the 70's with ambulation, will order vq scan tonight if d dimer positive    Follow-up recommendations Follow-up with PCP in 3-5 days , including all  additional recommended appointments as below Follow-up CBC, CMP in 3-5 days  patient extremely sensitive to beta blockers and these should never be prescribed,     Current Discharge Medication List    CONTINUE these medications which have NOT CHANGED   Details  albuterol (PROVENTIL HFA;VENTOLIN HFA) 108 (90 Base) MCG/ACT inhaler Inhale 2 puffs into the lungs every 6 (six) hours as needed for wheezing or shortness of breath.     alendronate (FOSAMAX) 70 MG tablet TAKE 1 TABLET BY MOUTH EVERY 7 DAYS ON TUESDAYS WITH A FULL GLASS OF WATER & ON AN EMPTY STOMACH Qty: 4 tablet, Refills: 6    amLODipine (NORVASC) 5 MG tablet Take 5 mg by mouth daily.    apixaban (ELIQUIS) 5 MG TABS tablet Take 1 tablet (5 mg total) by mouth 2 (two) times daily.    Calcium Carbonate-Vit D-Min (CALCIUM 1200 PO) Take 1 tablet by mouth daily.     fluticasone (FLONASE) 50 MCG/ACT nasal spray Place 1 spray into both nostrils daily as needed for allergies.     gabapentin (NEURONTIN) 600 MG tablet Take 600 mg by mouth 2 (two) times daily.     glipiZIDE (GLUCOTROL XL) 5 MG 24 hr tablet Take 5 mg by mouth every evening.     !! Multiple Vitamins-Minerals (CENTRUM SILVER PO) Take 1 tablet by mouth daily.     !!  Multiple Vitamins-Minerals (PRESERVISION AREDS PO) Take 1 tablet by mouth 2 (two) times daily.    nitroGLYCERIN (NITROSTAT) 0.4 MG SL tablet Place 0.4 mLs under the tongue every 5 (five) minutes as needed for chest pain (Max 3 doses).     ondansetron (ZOFRAN) 8 MG tablet Take 1 tablet (8 mg total) by mouth every 8 (eight) hours as needed for nausea or vomiting. Qty: 20 tablet, Refills: 0    oxybutynin (DITROPAN) 5 MG tablet Take 5 mg by mouth daily.    pantoprazole (PROTONIX) 40 MG tablet Take 1 tablet (40 mg total) by mouth 2 (two) times daily. Qty: 60 tablet, Refills: 11    rosuvastatin (CRESTOR) 5 MG tablet Take 5 mg by mouth at bedtime.    traMADol (ULTRAM) 50 MG tablet Take 100 mg by mouth daily as needed for moderate pain.      !! - Potential duplicate medications found. Please discuss with provider.       Discharge Condition: Stable  Discharge Instructions Get Medicines reviewed and adjusted: Please take all your medications with you for your next visit with your Primary MD  Please request your Primary MD to go over all hospital tests and procedure/radiological results at the follow up, please ask your Primary MD to get all Hospital records sent to his/her office.  If you experience worsening of your admission  symptoms, develop shortness of breath, life threatening emergency, suicidal or homicidal thoughts you must seek medical attention immediately by calling 911 or calling your MD immediately if symptoms less severe.  You must read complete instructions/literature along with all the possible adverse reactions/side effects for all the Medicines you take and that have been prescribed to you. Take any new Medicines after you have completely understood and accpet all the possible adverse reactions/side effects.   Do not drive when taking Pain medications.   Do not take more than prescribed Pain, Sleep and Anxiety Medications  Special Instructions: If you have smoked or  chewed Tobacco in the last 2 yrs please stop smoking, stop any regular Alcohol and or any Recreational drug use.  Wear Seat belts while driving.  Please note  You were cared for by a hospitalist during your hospital stay. Once you are discharged, your primary care physician will handle any further medical issues. Please note that NO REFILLS for any discharge medications will be authorized once you are discharged, as it is imperative that you return to your primary care physician (or establish a relationship with a primary care physician if you do not have one) for your aftercare needs so that they can reassess your need for medications and monitor your lab values.     Allergies  Allergen Reactions  . Amoxicillin Other (See Comments)    UNKNOWN      Disposition: 01-Home or Self Care   Consults:  Gen. cardiology EP     Significant Diagnostic Studies:  Dg Chest 2 View  Result Date: 03/06/2016 CLINICAL DATA:  Left-sided chest pain over the last 1 day. EXAM: CHEST  2 VIEW COMPARISON:  Two-view chest x-ray 10/24/2015 FINDINGS: The heart is mildly enlarged. Atherosclerotic calcifications are present at the the aortic arch. Chronic interstitial coarsening is similar the prior exam. Mild bibasilar airspace disease likely reflects atelectasis. There is no edema or effusion to suggest failure. No significant airspace consolidation is present. Degenerate changes of the thoracic spine are stable. The visualized soft tissues and bony thorax are otherwise unremarkable. IMPRESSION: 1. Borderline cardiomegaly without failure. 2. Atherosclerosis of the thoracic aorta. 3. Mild bibasilar airspace disease likely reflects atelectasis. Electronically Signed   By: San Morelle M.D.   On: 03/06/2016 18:14   US Abdomen Limited Ruq  Result Date: 03/07/2016 CLINICAL DATA:  Abdominal pain for 1 day, status postcholecystectomy EXAM: US ABDOMEN LIMITED - RIGHT UPPER QUADRANT COMPARISON:  CT scan  12/30/2015 FINDINGS: Gallbladder: Surgically absent Common bile duct: Diameter: 5.4 mm in diameter within normal limits Liver: No focal lesion identified. Within normal limits in parenchymal echogenicity. No intrahepatic biliary ductal dilatation. IMPRESSION: 1. Surgical absent gallbladder.  Normal CBD.  No focal hepatic mass. Electronically Signed   By: Lahoma Crocker M.D.   On: 03/07/2016 15:29      2-D echo  LV EF: 60% -   65%  ------------------------------------------------------------------- Indications:      Chest pain 786.51.  ------------------------------------------------------------------- History:   PMH:  First Degree AV Block. Right Bundle Branch Block. Atrial fibrillation.  Coronary artery disease.  Risk factors: Hypertension. Diabetes mellitus. Dyslipidemia.  ------------------------------------------------------------------- Study Conclusions  - Left ventricle: The cavity size was normal. Wall thickness was   normal. Systolic function was normal. The estimated ejection   fraction was in the range of 60% to 65%. The study is not   technically sufficient to allow evaluation of LV diastolic   function. - Aortic valve: There was mild regurgitation. - Left  atrium: The atrium was mildly dilated. - Atrial septum: There was a patent foramen ovale. - Pulmonary arteries: PA peak pressure: 35 mm Hg (S).   Filed Weights   03/07/16 0520 03/08/16 0516 03/09/16 0527  Weight: 78 kg (171 lb 14.4 oz) 77.3 kg (170 lb 6.4 oz) 78.8 kg (173 lb 11.2 oz)     Microbiology: No results found for this or any previous visit (from the past 240 hour(s)).     Blood Culture    Component Value Date/Time   SDES URINE, RANDOM 09/21/2012 0525   SPECREQUEST ADDED 7035 09/21/2012 0525   CULT  09/21/2012 0525    Multiple bacterial morphotypes present, none predominant. Suggest appropriate recollection if clinically indicated.   REPTSTATUS 09/22/2012 FINAL 09/21/2012 0525       Labs: Results for orders placed or performed during the hospital encounter of 03/06/16 (from the past 48 hour(s))  Glucose, capillary     Status: Abnormal   Collection Time: 03/07/16  4:35 PM  Result Value Ref Range   Glucose-Capillary 119 (H) 65 - 99 mg/dL  Glucose, capillary     Status: Abnormal   Collection Time: 03/07/16  6:55 PM  Result Value Ref Range   Glucose-Capillary 196 (H) 65 - 99 mg/dL  Glucose, capillary     Status: Abnormal   Collection Time: 03/07/16  8:17 PM  Result Value Ref Range   Glucose-Capillary 146 (H) 65 - 99 mg/dL  Glucose, capillary     Status: Abnormal   Collection Time: 03/08/16  7:33 AM  Result Value Ref Range   Glucose-Capillary 126 (H) 65 - 99 mg/dL  Glucose, capillary     Status: Abnormal   Collection Time: 03/08/16 11:18 AM  Result Value Ref Range   Glucose-Capillary 137 (H) 65 - 99 mg/dL  Glucose, capillary     Status: Abnormal   Collection Time: 03/08/16  4:38 PM  Result Value Ref Range   Glucose-Capillary 168 (H) 65 - 99 mg/dL  Glucose, capillary     Status: Abnormal   Collection Time: 03/08/16  9:14 PM  Result Value Ref Range   Glucose-Capillary 151 (H) 65 - 99 mg/dL  CBC     Status: None   Collection Time: 03/09/16  5:28 AM  Result Value Ref Range   WBC 8.8 4.0 - 10.5 K/uL   RBC 4.32 3.87 - 5.11 MIL/uL   Hemoglobin 13.6 12.0 - 15.0 g/dL   HCT 41.3 36.0 - 46.0 %   MCV 95.6 78.0 - 100.0 fL   MCH 31.5 26.0 - 34.0 pg   MCHC 32.9 30.0 - 36.0 g/dL   RDW 13.1 11.5 - 15.5 %   Platelets 204 150 - 400 K/uL  Comprehensive metabolic panel     Status: Abnormal   Collection Time: 03/09/16  5:28 AM  Result Value Ref Range   Sodium 137 135 - 145 mmol/L   Potassium 4.2 3.5 - 5.1 mmol/L   Chloride 103 101 - 111 mmol/L   CO2 26 22 - 32 mmol/L   Glucose, Bld 130 (H) 65 - 99 mg/dL   BUN 27 (H) 6 - 20 mg/dL   Creatinine, Ser 1.23 (H) 0.44 - 1.00 mg/dL   Calcium 9.0 8.9 - 10.3 mg/dL   Total Protein 6.0 (L) 6.5 - 8.1 g/dL   Albumin 3.5  3.5 - 5.0 g/dL   AST 19 15 - 41 U/L   ALT 15 14 - 54 U/L   Alkaline Phosphatase 54 38 - 126 U/L  Total Bilirubin 0.7 0.3 - 1.2 mg/dL   GFR calc non Af Amer 37 (L) >60 mL/min   GFR calc Af Amer 43 (L) >60 mL/min    Comment: (NOTE) The eGFR has been calculated using the CKD EPI equation. This calculation has not been validated in all clinical situations. eGFR's persistently <60 mL/min signify possible Chronic Kidney Disease.    Anion gap 8 5 - 15  Glucose, capillary     Status: Abnormal   Collection Time: 03/09/16  7:36 AM  Result Value Ref Range   Glucose-Capillary 127 (H) 65 - 99 mg/dL  Glucose, capillary     Status: Abnormal   Collection Time: 03/09/16 11:33 AM  Result Value Ref Range   Glucose-Capillary 166 (H) 65 - 99 mg/dL   Comment 1 Notify RN    Comment 2 Document in Chart      Lipid Panel     Component Value Date/Time   CHOL 145 10/25/2015 0329   TRIG 84 10/25/2015 0329   HDL 52 10/25/2015 0329   CHOLHDL 2.8 10/25/2015 0329   VLDL 17 10/25/2015 0329   LDLCALC 76 10/25/2015 0329     Lab Results  Component Value Date   HGBA1C 6.0 (H) 10/24/2015   HGBA1C 6.0 06/07/2014   HGBA1C 5.8 (H) 02/05/2013        HPI :  DELMI FULFER is a 82 y.o. female with PMhx of CAD ((DES to mLAD 2005, cutting balloon angioplasty due to ISR in 2006), Permanent AFib, DM, HTN, HLD, GERD, IBS, spinal stenosis, anemia, multinodular goiter, known RBBB, CKD stage III, was brought to [facility] by EMS secondary to CP. After several hours of persistent discomfort, she called EMS. She received 4 baby ASA, as well as 2 SL NTG without significant relief. She says it was only until she went for her CXR did symptoms finally ease off spontaneously (6pm).    She was seen in consult by cardiology service, dr. Harrington Challenger who did not suspect a cardiac etiology and has r/o with neg Trop x3.  She was apparently started on BB here for her HTN apparently by H&P note receiving a total of 2 dose of metoprolol  12.'5mg'$ , last dose 0849 yesterday.  The patient last evening was on the bedside commode urinating when she had a syncopal event, she was placed back in bed an given brief compressions with ROSC, telemetry noted pause of 7.8 seconds  She was seen by cardiology NP, suspected micturation syncope/vagal event, her BB held and EP asked to evaluate.  The patient feels very well this morning, denies any ongoing c/o CP since her arrival, no palpitations, no SOB.  She reports nothing like last PM has ever happened to her before.  She felt lightheaded a second or so prior to fainting.  She has never had any near syncope or syncope historically.  HOSPITAL COURSE:     Chest pain  - Atypical in that in occurred at rest and not exacerbated by activity; Normal nuclear scan in April 2017. No further assessment recommended at this time. This is likely noncardiac - Initial EKG not significantly and initial troponin 0.01; CXR without acute disease  - Received 2 doses of SL NTG and ASA 324 mg en route Telemetry EKG, troponin negative, Right upper quadrant ultrasound to rule out common bile duct stone, liver function, lipase - Lopressor started for HTN; discontinued due to syncope related to vasovagal episode with a long pause, continue Crestor  Continue treatment for GERD, continue PPI  2-D echo results as above  code blue/7.8 second pause Associated with loss of consciousness,, likely vagal event, the patient states she was "holding it"  for a few minutes waiting to get to the commode, and once on the commode started to urinate and fainted.  Appeared to become brady prior to the pause and had profound bradycardia afterwards for approx 20seconds. Brief CPR was performed  Otherwise baseline rhythm is atrial fibrillation Beta blocker was discontinued and the patient was ordered to receive one dose of atropine, but never given EP was consulted, and followed her during this hospitalization   CAD - DES to mLAD in  2005 and subsequent angioplasty for in-stent restenosis  - LHC (February 2012) with LAD 40%, patent stent, prox &mid RCA 30%, and EF 60%  - Evaluating for ACS as above  - Continue Crestor , beta blocker, aspirin discontinued prior to discharge as the patient was not on aspirin and is currently on eliquis     Chronic atrial fibrillation  - In rate-controlled a fib on admission  - CHADS-VASc is at least 42 (age x2, gender, CAD, HTN, DM) - Continue AC with Eliquis 5 mg BID   4. Type II DM  - A1c 6.0% in April 2017  - Managed at home with glipizide only,   Stable CBGs    5. Hypertension  - Not on any antihypertensives at time of admission  - There is diastolic HTN in ED   Did not tolerate beta blockers  6. Depression  - Stable, continue Lexapro  7. GERD  - Managed with BID Protonix at home, will continue        Discharge Exam:   Blood pressure 107/76, pulse 70, temperature 97.8 F (36.6 C), temperature source Oral, resp. rate 18, height _0  (1.6 m), weight 78.8 kg (173 lb 11.2 oz), SpO2 97 %.   Pt is alert and oriented, pleasant elderly woman in NAD HEENT: normal Neck: JVP - normal Lungs: CTA bilaterally CV: Irregularly irregular without murmur or gallop Abd: soft, NT, Positive BS, no hepatomegaly Ext: no C/C/E, distal pulses intact and equal Skin: warm/dry no rash   Follow-up Information    BADGER,MICHAEL C, MD. Schedule an appointment as soon as possible for a visit in 2 day(s).   Specialty:  Family Medicine Why:  Hospital follow-up Contact information: [place] [id number] [phone]        Dorris Carnes, MD. Schedule an appointment as soon as possible for a visit in 3 day(s).   Specialty:  Cardiology Why:  Hospital follow-up Contact information: [address] [place] [address] [phone]           Signed: Reyne Dumas 03/09/2016, 12:00 PM        Time spent >45 mins

## 2016-03-09 NOTE — Evaluation (Signed)
Occupational Therapy Evaluation Patient Details Name: [name] MRN: [id number] DOB: August 25, 1923 Today's Date: 03/09/2016    History of Present Illness 82 y.o. female admitted for L sided chest pain, dyspnea, and clammy sensation. In hospital, pt had brief LOC and severe bradycardia while toileting - CPR performed and pt became responsive. PMH significant for HTN, orthostatic hypotension, HLD, CAD, PAF, 1st degree AV block, anemia, DM type II w/ neuropathy, GERD, IBS, and RA.   Clinical Impression   PTA, pt was independent with ADLs and used RW or SPC for mobility. Pt currently presents with generalized weakness, deconditioned, decreased energy, and balance deficits and required min assist for basic transfers and LB ADLs. Pt plans to d/c home with intermittent assistance from her family. Pt will benefit from continued acute OT to increase independence and safety with ADLs and mobility to allow for safe discharge home. Recommend HHOT.     Follow Up Recommendations  Home health OT;Supervision/Assistance - 24 hour    Equipment Recommendations  None recommended by OT    Recommendations for Other Services       Precautions / Restrictions Precautions Precautions: Fall Restrictions Weight Bearing Restrictions: No      Mobility Bed Mobility Overal bed mobility: Needs Assistance Bed Mobility: Supine to Sit     Supine to sit: Min assist     General bed mobility comments: Min assist for trunk support to come to sitting position.   Transfers Overall transfer level: Needs assistance Equipment used: Rolling walker (2 wheeled) Transfers: Sit to/from [facility] Sit to Stand: Min assist Stand pivot transfers: Min guard       General transfer comment: Min assist for boost to stand and to stabilize balance upon standing.  VCs for safe hand placement    Balance Overall balance assessment: Needs assistance Sitting-balance support: No upper extremity  supported;Feet supported Sitting balance-Leahy Scale: Good     Standing balance support: Bilateral upper extremity supported;During functional activity Standing balance-Leahy Scale: Poor                              ADL Overall ADL's : Needs assistance/impaired Eating/Feeding: Independent;Sitting   Grooming: Min guard;Standing   Upper Body Bathing: Set up;Sitting   Lower Body Bathing: Minimal assistance;Sit to/from stand   Upper Body Dressing : Set up;Sitting   Lower Body Dressing: Minimal assistance;Sit to/from stand   Toilet Transfer: Minimal assistance;Ambulation;BSC;RW   Toileting- Clothing Manipulation and Hygiene: Minimal assistance;Sit to/from stand       Functional mobility during ADLs: Minimal assistance;Rolling walker       Vision Vision Assessment?: No apparent visual deficits   Perception     Praxis      Pertinent Vitals/Pain Pain Assessment: No/denies pain     Hand Dominance Right   Extremity/Trunk Assessment Upper Extremity Assessment Upper Extremity Assessment: Generalized weakness   Lower Extremity Assessment Lower Extremity Assessment: Generalized weakness   Cervical / Trunk Assessment Cervical / Trunk Assessment: Kyphotic   Communication Communication Communication: No difficulties   Cognition Arousal/Alertness: Awake/alert Behavior During Therapy: WFL for tasks assessed/performed Overall Cognitive Status: Within Functional Limits for tasks assessed                     General Comments       Exercises       Shoulder Instructions      Home Living Family/patient expects to be discharged to:: Private residence Living Arrangements:  Alone Available Help at Discharge: Family;Available PRN/intermittently Type of Home: House Home Access: Stairs to enter [employer] of Steps: 2 Entrance Stairs-Rails: Right Home Layout: Two level;Able to live on main level with bedroom/bathroom Alternate Level  Stairs-Number of Steps: flight - does not use 2nd level   Bathroom Shower/Tub: Walk-in shower;Curtain   Bathroom Toilet: Handicapped height     Home Equipment: Environmental consultant - 2 wheels;Bedside commode;Cane - single point;Shower seat;Grab bars - tub/shower;Hand held shower head          Prior Functioning/Environment Level of Independence: Independent with assistive device(s)        Comments: Uses RW in the house and SPC outside; independent with all ADL/IADLs and drives. However, pt reports significant reduction in energy and motivation to do anything for past 6 months    OT Diagnosis: Generalized weakness   OT Problem List: Decreased strength;Decreased activity tolerance;Impaired balance (sitting and/or standing);Decreased safety awareness;Decreased knowledge of use of DME or AE;Pain   OT Treatment/Interventions: Self-care/ADL training;Therapeutic exercise;DME and/or AE instruction;Therapeutic activities;Balance training;Patient/family education    OT Goals(Current goals can be found in the care plan section) Acute Rehab OT Goals Patient Stated Goal: to have more energy to do normal daily activities OT Goal Formulation: With patient Time For Goal Achievement: 03/23/16 Potential to Achieve Goals: Good ADL Goals Pt Will Perform Grooming: with modified independence;standing Pt Will Perform Upper Body Bathing: with modified independence;sitting Pt Will Perform Lower Body Bathing: with modified independence;sit to/from stand Pt Will Transfer to Toilet: with modified independence;ambulating;bedside commode Pt Will Perform Toileting - Clothing Manipulation and hygiene: with modified independence;sit to/from stand Additional ADL Goal #1: Pt will verbalize/utilize 3 energy conservation strategies with min verbal cues during ADL tasks.  OT Frequency: Min 3X/week   Barriers to D/C:            Co-evaluation              End of Session Equipment Utilized During Treatment: Gait  belt;Rolling walker Nurse Communication: Mobility status  Activity Tolerance: Patient tolerated treatment well Patient left: in chair;with call bell/phone within reach;with chair alarm set   Time: 1042-1101 OT Time Calculation (min): 19 min Charges:  OT General Charges $OT Visit: 1 Procedure OT Evaluation $OT Eval Moderate Complexity: 1 Procedure G-Codes:    Redmond Baseman, OTR/L [phone] 03/09/2016, 11:45 AM

## 2016-03-09 NOTE — Progress Notes (Signed)
Dr. Allyson Sabal updated re: pt refusing insulin. Requesting for oral antiglycemic. New order received for glyburide 2.5 mg BID.

## 2016-03-09 NOTE — Progress Notes (Signed)
Subjective: No CP  NO dzziness   Objective: Vitals:   03/08/16 2347 03/09/16 0527 03/09/16 0759 03/09/16 1030  BP: 131/73 (!) 129/59 (!) 106/52 107/76  Pulse: 78 70 70   Resp: 18 16 18    Temp: 98 F (36.7 C) 98.7 F (37.1 C) 97.8 F (36.6 C)   TempSrc: Oral Oral Oral   SpO2: 96% 99% 97%   Weight:  173 lb 11.2 oz (78.8 kg)    Height:       Weight change: 3 lb 4.8 oz (1.497 kg)  Intake/Output Summary (Last 24 hours) at 03/09/16 1312 Last data filed at 03/09/16 0056  Gross per 24 hour  Intake                0 ml  Output              100 ml  Net             -100 ml    General: Alert, awake, oriented x3, in no acute distress Neck:  JVP is normal Heart: Regular rate and rhythm, without murmurs, rubs, gallops.  Lungs: Clear to auscultation.  No rales or wheezes. Exemities:  No edema.   Neuro: Grossly intact, nonfocal.  Tele  SR    Lab Results: Results for orders placed or performed during the hospital encounter of 03/06/16 (from the past 24 hour(s))  Glucose, capillary     Status: Abnormal   Collection Time: 03/08/16  4:38 PM  Result Value Ref Range   Glucose-Capillary 168 (H) 65 - 99 mg/dL  Glucose, capillary     Status: Abnormal   Collection Time: 03/08/16  9:14 PM  Result Value Ref Range   Glucose-Capillary 151 (H) 65 - 99 mg/dL  CBC     Status: None   Collection Time: 03/09/16  5:28 AM  Result Value Ref Range   WBC 8.8 4.0 - 10.5 K/uL   RBC 4.32 3.87 - 5.11 MIL/uL   Hemoglobin 13.6 12.0 - 15.0 g/dL   HCT 41.3 36.0 - 46.0 %   MCV 95.6 78.0 - 100.0 fL   MCH 31.5 26.0 - 34.0 pg   MCHC 32.9 30.0 - 36.0 g/dL   RDW 13.1 11.5 - 15.5 %   Platelets 204 150 - 400 K/uL  Comprehensive metabolic panel     Status: Abnormal   Collection Time: 03/09/16  5:28 AM  Result Value Ref Range   Sodium 137 135 - 145 mmol/L   Potassium 4.2 3.5 - 5.1 mmol/L   Chloride 103 101 - 111 mmol/L   CO2 26 22 - 32 mmol/L   Glucose, Bld 130 (H) 65 - 99 mg/dL   BUN 27 (H) 6 - 20 mg/dL   Creatinine, Ser 1.23 (H) 0.44 - 1.00 mg/dL   Calcium 9.0 8.9 - 10.3 mg/dL   Total Protein 6.0 (L) 6.5 - 8.1 g/dL   Albumin 3.5 3.5 - 5.0 g/dL   AST 19 15 - 41 U/L   ALT 15 14 - 54 U/L   Alkaline Phosphatase 54 38 - 126 U/L   Total Bilirubin 0.7 0.3 - 1.2 mg/dL   GFR calc non Af Amer 37 (L) >60 mL/min   GFR calc Af Amer 43 (L) >60 mL/min   Anion gap 8 5 - 15  Glucose, capillary     Status: Abnormal   Collection Time: 03/09/16  7:36 AM  Result Value Ref Range   Glucose-Capillary 127 (H) 65 - 99 mg/dL  Glucose,  capillary     Status: Abnormal   Collection Time: 03/09/16 11:33 AM  Result Value Ref Range   Glucose-Capillary 166 (H) 65 - 99 mg/dL   Comment 1 Notify RN    Comment 2 Document in Chart     Studies/Results: No results found.  Medications:REviewed   @PROBHOSP @  1  Bradycardia  Resolved  Off of metoprolol  No indication for PM at this time  2  CP  No recurrence  Follow  I would take off of ASA  3  Afib  Rates OK on no agents  Continue Eliquis    IF OK with walking , OK to d/c home  WIll make sure she has outpt appt      LOS: 1 day   Dorris Carnes 03/09/2016, 1:12 PM

## 2016-03-10 ENCOUNTER — Inpatient Hospital Stay (HOSPITAL_COMMUNITY): Payer: Medicare Other

## 2016-03-10 LAB — GLUCOSE, CAPILLARY
GLUCOSE-CAPILLARY: 95 mg/dL (ref 65–99)
Glucose-Capillary: 139 mg/dL — ABNORMAL HIGH (ref 65–99)
Glucose-Capillary: 133 mg/dL — ABNORMAL HIGH (ref 65–99)

## 2016-03-10 MED ORDER — TECHNETIUM TO 99M ALBUMIN AGGREGATED
4.0600 | Freq: Once | INTRAVENOUS | Status: AC | PRN
  Administered 2016-03-10: 4 via INTRAVENOUS

## 2016-03-10 MED ORDER — TECHNETIUM TC 99M DIETHYLENETRIAME-PENTAACETIC ACID: 30.0000 | Freq: Once | INTRAVENOUS | Status: DC | PRN

## 2016-03-10 NOTE — Discharge Summary (Addendum)
Physician Discharge Summary  [name] MRN: [id number] DOB/AGE: June 27, 1924 82 y.o.  PCP: Chesley Noon, MD   Admit date: 03/06/2016 Discharge date: 03/10/2016  Discharge Diagnoses:    Active Problems:   Bradycardia   GERD   Coronary artery disease   Atrial fibrillation, permanent   Diabetes mellitus type II, non insulin dependent (HCC)   HTN (hypertension)   Chest pain, negative MI, negative Nuc, may be GI   Depression   Chest pain at rest   PVC's (premature ventricular contractions)   Addendum-patient unable to discharge to SNF today, still unable to bear weight on the left knee, evaluation underway    Follow-up recommendations Follow-up with PCP in 3-5 days , including all  additional recommended appointments as below Follow-up CBC, CMP in 3-5 days  patient extremely sensitive to beta blockers and these should never be prescribed, Patient may need up to 2 L of oxygen with ambulation, that can be slowly weaned    Current Discharge Medication List    CONTINUE these medications which have NOT CHANGED   Details  albuterol (PROVENTIL HFA;VENTOLIN HFA) 108 (90 Base) MCG/ACT inhaler Inhale 2 puffs into the lungs every 6 (six) hours as needed for wheezing or shortness of breath.     alendronate (FOSAMAX) 70 MG tablet TAKE 1 TABLET BY MOUTH EVERY 7 DAYS ON TUESDAYS WITH A FULL GLASS OF WATER & ON AN EMPTY STOMACH Qty: 4 tablet, Refills: 6    amLODipine (NORVASC) 5 MG tablet Take 5 mg by mouth daily.    apixaban (ELIQUIS) 5 MG TABS tablet Take 1 tablet (5 mg total) by mouth 2 (two) times daily.    Calcium Carbonate-Vit D-Min (CALCIUM 1200 PO) Take 1 tablet by mouth daily.     fluticasone (FLONASE) 50 MCG/ACT nasal spray Place 1 spray into both nostrils daily as needed for allergies.     gabapentin (NEURONTIN) 600 MG tablet Take 600 mg by mouth 2 (two) times daily.     glipiZIDE (GLUCOTROL XL) 5 MG 24 hr tablet Take 5 mg by mouth every evening.     !! Multiple  Vitamins-Minerals (CENTRUM SILVER PO) Take 1 tablet by mouth daily.     !! Multiple Vitamins-Minerals (PRESERVISION AREDS PO) Take 1 tablet by mouth 2 (two) times daily.    nitroGLYCERIN (NITROSTAT) 0.4 MG SL tablet Place 0.4 mLs under the tongue every 5 (five) minutes as needed for chest pain (Max 3 doses).     ondansetron (ZOFRAN) 8 MG tablet Take 1 tablet (8 mg total) by mouth every 8 (eight) hours as needed for nausea or vomiting. Qty: 20 tablet, Refills: 0    oxybutynin (DITROPAN) 5 MG tablet Take 5 mg by mouth daily.    pantoprazole (PROTONIX) 40 MG tablet Take 1 tablet (40 mg total) by mouth 2 (two) times daily. Qty: 60 tablet, Refills: 11    rosuvastatin (CRESTOR) 5 MG tablet Take 5 mg by mouth at bedtime.     !! - Potential duplicate medications found. Please discuss with provider.    STOP taking these medications     traMADol (ULTRAM) 50 MG tablet          Discharge Condition: Stable  Discharge Instructions Get Medicines reviewed and adjusted: Please take all your medications with you for your next visit with your Primary MD  Please request your Primary MD to go over all hospital tests and procedure/radiological results at the follow up, please ask your Primary MD to get all Hospital records sent  to his/her office.  If you experience worsening of your admission symptoms, develop shortness of breath, life threatening emergency, suicidal or homicidal thoughts you must seek medical attention immediately by calling 911 or calling your MD immediately if symptoms less severe.  You must read complete instructions/literature along with all the possible adverse reactions/side effects for all the Medicines you take and that have been prescribed to you. Take any new Medicines after you have completely understood and accpet all the possible adverse reactions/side effects.   Do not drive when taking Pain medications.   Do not take more than prescribed Pain, Sleep and Anxiety  Medications  Special Instructions: If you have smoked or chewed Tobacco in the last 2 yrs please stop smoking, stop any regular Alcohol and or any Recreational drug use.  Wear Seat belts while driving.  Please note  You were cared for by a hospitalist during your hospital stay. Once you are discharged, your primary care physician will handle any further medical issues. Please note that NO REFILLS for any discharge medications will be authorized once you are discharged, as it is imperative that you return to your primary care physician (or establish a relationship with a primary care physician if you do not have one) for your aftercare needs so that they can reassess your need for medications and monitor your lab values.  Discharge Instructions    Diet - low sodium heart healthy    Complete by:  As directed   Diet - low sodium heart healthy    Complete by:  As directed   Increase activity slowly    Complete by:  As directed   Increase activity slowly    Complete by:  As directed       Allergies  Allergen Reactions  . Amoxicillin Other (See Comments)    UNKNOWN      Disposition: SNF   Consults:  Gen. cardiology EP     Significant Diagnostic Studies:  Dg Chest 2 View  Result Date: 03/10/2016 CLINICAL DATA:  Pt reports SOB x 4 days; h/o DM, HTN, and PNA; non-smoker EXAM: CHEST  2 VIEW COMPARISON:  03/06/2016 FINDINGS: Lungs are hyperexpanded but clear. No pleural effusion or pneumothorax. Cardiac silhouette is borderline enlarged. No mediastinal or hilar masses or evidence of adenopathy. Bony thorax is demineralized but intact. IMPRESSION: No acute cardiopulmonary disease. Electronically Signed   By: Lajean Manes M.D.   On: 03/10/2016 08:15   Dg Chest 2 View  Result Date: 03/06/2016 CLINICAL DATA:  Left-sided chest pain over the last 1 day. EXAM: CHEST  2 VIEW COMPARISON:  Two-view chest x-ray 10/24/2015 FINDINGS: The heart is mildly enlarged. Atherosclerotic calcifications  are present at the the aortic arch. Chronic interstitial coarsening is similar the prior exam. Mild bibasilar airspace disease likely reflects atelectasis. There is no edema or effusion to suggest failure. No significant airspace consolidation is present. Degenerate changes of the thoracic spine are stable. The visualized soft tissues and bony thorax are otherwise unremarkable. IMPRESSION: 1. Borderline cardiomegaly without failure. 2. Atherosclerosis of the thoracic aorta. 3. Mild bibasilar airspace disease likely reflects atelectasis. Electronically Signed   By: San Morelle M.D.   On: 03/06/2016 18:14   Nm Pulmonary Perf And Vent  Result Date: 03/10/2016 CLINICAL DATA:  Shortness of Breath EXAM: NUCLEAR MEDICINE VENTILATION - PERFUSION LUNG SCAN Views: Anterior, posterior, left lateral, right lateral, RPO, LPO, RAO, LAO -ventilation and perfusion RADIOPHARMACEUTICALS:  30.0 mCi Technetium-53mDTPA aerosol inhalation and 4.06 mCi Technetium-95mAA IV  COMPARISON:  Chest radiograph March 10, 2016 FINDINGS: Ventilation: Radiotracer uptake is homogeneous and symmetric bilaterally. No focal ventilation defects identified. Perfusion: Radiotracer uptake is homogeneous and symmetric bilaterally. No perfusion defects are evident. IMPRESSION: No ventilation or perfusion defects. This study is regarded as within normal limits. Electronically Signed   By: Lowella Grip III M.D.   On: 03/10/2016 08:20   US Abdomen Limited Ruq  Result Date: 03/07/2016 CLINICAL DATA:  Abdominal pain for 1 day, status postcholecystectomy EXAM: US ABDOMEN LIMITED - RIGHT UPPER QUADRANT COMPARISON:  CT scan 12/30/2015 FINDINGS: Gallbladder: Surgically absent Common bile duct: Diameter: 5.4 mm in diameter within normal limits Liver: No focal lesion identified. Within normal limits in parenchymal echogenicity. No intrahepatic biliary ductal dilatation. IMPRESSION: 1. Surgical absent gallbladder.  Normal CBD.  No focal hepatic  mass. Electronically Signed   By: Lahoma Crocker M.D.   On: 03/07/2016 15:29      2-D echo  LV EF: 60% -   65%  ------------------------------------------------------------------- Indications:      Chest pain 786.51.  ------------------------------------------------------------------- History:   PMH:  First Degree AV Block. Right Bundle Branch Block. Atrial fibrillation.  Coronary artery disease.  Risk factors: Hypertension. Diabetes mellitus. Dyslipidemia.  ------------------------------------------------------------------- Study Conclusions  - Left ventricle: The cavity size was normal. Wall thickness was   normal. Systolic function was normal. The estimated ejection   fraction was in the range of 60% to 65%. The study is not   technically sufficient to allow evaluation of LV diastolic   function. - Aortic valve: There was mild regurgitation. - Left atrium: The atrium was mildly dilated. - Atrial septum: There was a patent foramen ovale. - Pulmonary arteries: PA peak pressure: 35 mm Hg (S).   Filed Weights   03/08/16 0516 03/09/16 0527 03/10/16 0551  Weight: 77.3 kg (170 lb 6.4 oz) 78.8 kg (173 lb 11.2 oz) 76.9 kg (169 lb 8 oz)     Microbiology: No results found for this or any previous visit (from the past 240 hour(s)).     Blood Culture    Component Value Date/Time   SDES URINE, RANDOM 09/21/2012 0525   SPECREQUEST ADDED 4680 09/21/2012 0525   CULT  09/21/2012 0525    Multiple bacterial morphotypes present, none predominant. Suggest appropriate recollection if clinically indicated.   REPTSTATUS 09/22/2012 FINAL 09/21/2012 0525      Labs: Results for orders placed or performed during the hospital encounter of 03/06/16 (from the past 48 hour(s))  Glucose, capillary     Status: Abnormal   Collection Time: 03/08/16 11:18 AM  Result Value Ref Range   Glucose-Capillary 137 (H) 65 - 99 mg/dL  Glucose, capillary     Status: Abnormal   Collection Time:  03/08/16  4:38 PM  Result Value Ref Range   Glucose-Capillary 168 (H) 65 - 99 mg/dL  Glucose, capillary     Status: Abnormal   Collection Time: 03/08/16  9:14 PM  Result Value Ref Range   Glucose-Capillary 151 (H) 65 - 99 mg/dL  CBC     Status: None   Collection Time: 03/09/16  5:28 AM  Result Value Ref Range   WBC 8.8 4.0 - 10.5 K/uL   RBC 4.32 3.87 - 5.11 MIL/uL   Hemoglobin 13.6 12.0 - 15.0 g/dL   HCT 41.3 36.0 - 46.0 %   MCV 95.6 78.0 - 100.0 fL   MCH 31.5 26.0 - 34.0 pg   MCHC 32.9 30.0 - 36.0 g/dL   RDW 13.1 11.5 -  15.5 %   Platelets 204 150 - 400 K/uL  Comprehensive metabolic panel     Status: Abnormal   Collection Time: 03/09/16  5:28 AM  Result Value Ref Range   Sodium 137 135 - 145 mmol/L   Potassium 4.2 3.5 - 5.1 mmol/L   Chloride 103 101 - 111 mmol/L   CO2 26 22 - 32 mmol/L   Glucose, Bld 130 (H) 65 - 99 mg/dL   BUN 27 (H) 6 - 20 mg/dL   Creatinine, Ser 1.23 (H) 0.44 - 1.00 mg/dL   Calcium 9.0 8.9 - 10.3 mg/dL   Total Protein 6.0 (L) 6.5 - 8.1 g/dL   Albumin 3.5 3.5 - 5.0 g/dL   AST 19 15 - 41 U/L   ALT 15 14 - 54 U/L   Alkaline Phosphatase 54 38 - 126 U/L   Total Bilirubin 0.7 0.3 - 1.2 mg/dL   GFR calc non Af Amer 37 (L) >60 mL/min   GFR calc Af Amer 43 (L) >60 mL/min    Comment: (NOTE) The eGFR has been calculated using the CKD EPI equation. This calculation has not been validated in all clinical situations. eGFR's persistently <60 mL/min signify possible Chronic Kidney Disease.    Anion gap 8 5 - 15  Glucose, capillary     Status: Abnormal   Collection Time: 03/09/16  7:36 AM  Result Value Ref Range   Glucose-Capillary 127 (H) 65 - 99 mg/dL  Glucose, capillary     Status: Abnormal   Collection Time: 03/09/16 11:33 AM  Result Value Ref Range   Glucose-Capillary 166 (H) 65 - 99 mg/dL   Comment 1 Notify RN    Comment 2 Document in Chart   Glucose, capillary     Status: Abnormal   Collection Time: 03/09/16  4:30 PM  Result Value Ref Range    Glucose-Capillary 178 (H) 65 - 99 mg/dL   Comment 1 Notify RN    Comment 2 Document in Chart   D-dimer, quantitative (not at [facility])     Status: Abnormal   Collection Time: 03/09/16  7:13 PM  Result Value Ref Range   D-Dimer, Quant 1.37 (H) 0.00 - 0.50 ug/mL-FEU    Comment: (NOTE) At the manufacturer cut-off of 0.50 ug/mL FEU, this assay has been documented to exclude PE with a sensitivity and negative predictive value of 97 to 99%.  At this time, this assay has not been approved by the FDA to exclude DVT/VTE. Results should be correlated with clinical presentation.   Glucose, capillary     Status: Abnormal   Collection Time: 03/09/16  8:46 PM  Result Value Ref Range   Glucose-Capillary 172 (H) 65 - 99 mg/dL     Lipid Panel     Component Value Date/Time   CHOL 145 10/25/2015 0329   TRIG 84 10/25/2015 0329   HDL 52 10/25/2015 0329   CHOLHDL 2.8 10/25/2015 0329   VLDL 17 10/25/2015 0329   LDLCALC 76 10/25/2015 0329     Lab Results  Component Value Date   HGBA1C 6.0 (H) 10/24/2015   HGBA1C 6.0 06/07/2014   HGBA1C 5.8 (H) 02/05/2013        HPI :  [name] is a 82 y.o. female with PMhx of CAD ((DES to mLAD 2005, cutting balloon angioplasty due to ISR in 2006), Permanent AFib, DM, HTN, HLD, GERD, IBS, spinal stenosis, anemia, multinodular goiter, known RBBB, CKD stage III, was brought to [facility] by EMS secondary to CP. After  several hours of persistent discomfort, she called EMS. She received 4 baby ASA, as well as 2 SL NTG without significant relief. She says it was only until she went for her CXR did symptoms finally ease off spontaneously (6pm).    She was seen in consult by cardiology service, dr. Harrington Challenger who did not suspect a cardiac etiology and has r/o with neg Trop x3.  She was apparently started on BB here for her HTN apparently by H&P note receiving a total of 2 dose of metoprolol 12.'5mg'$ , last dose 0849 yesterday.  The patient last evening was on the bedside  commode urinating when she had a syncopal event, she was placed back in bed an given brief compressions with ROSC, telemetry noted pause of 7.8 seconds  She was seen by cardiology NP, suspected micturation syncope/vagal event, her BB held and EP asked to evaluate.  The patient feels very well this morning, denies any ongoing c/o CP since her arrival, no palpitations, no SOB.  She reports nothing like last PM has ever happened to her before.  She felt lightheaded a second or so prior to fainting.  She has never had any near syncope or syncope historically.  HOSPITAL COURSE:     Chest pain  - Atypical in that in occurred at rest and not exacerbated by activity; Normal nuclear scan in April 2017. No further assessment recommended at this time. This is likely noncardiac - Initial EKG not significantly and initial troponin 0.01; CXR without acute disease  - Received 2 doses of SL NTG and ASA 324 mg en route Telemetry EKG, troponin negative, Right upper quadrant ultrasound to rule out common bile duct stone, liver function, lipase - Lopressor started for HTN; discontinued due to syncope related to vasovagal episode with a long pause, continue Crestor  Continue treatment for GERD, continue PPI 2-D echo results as above  code blue/7.8 second pause Associated with loss of consciousness,, likely vagal event, the patient states she was "holding it"  for a few minutes waiting to get to the commode, and once on the commode started to urinate and fainted.  Appeared to become brady prior to the pause and had profound bradycardia afterwards for approx 20seconds. Brief CPR was performed  Otherwise baseline rhythm is atrial fibrillation Beta blocker was discontinued and the patient was ordered to receive one dose of atropine, but never given EP was consulted, and followed her during this hospitalization, as per cardiology no indication for pacemaker  Hypoxemia-likely secondary to atelectasis Chest x-ray  negative, VQ scan negative, 2-D echo within normal limits Continue to wean oxygen   CAD - DES to mLAD in 2005 and subsequent angioplasty for in-stent restenosis  - LHC (February 2012) with LAD 40%, patent stent, prox &mid RCA 30%, and EF 60%  - Evaluating for ACS as above  - Continue Crestor , beta blocker, aspirin discontinued prior to discharge as the patient was not on aspirin and is currently on eliquis      Chronic atrial fibrillation  - In rate-controlled a fib on admission  - CHADS-VASc is at least 50 (age x2, gender, CAD, HTN, DM) - Continue AC with Eliquis 5 mg BID   4. Type II DM  - A1c 6.0% in April 2017  - Managed at home with glipizide only, resume  Stable CBGs    5. Hypertension  - Not on any antihypertensives at time of admission  - There is diastolic HTN in ED   Did not tolerate beta blockers  6. Depression  - Stable, continue Lexapro  7. GERD  - Managed with BID Protonix at home, will continue        Discharge Exam:   Blood pressure 128/73, pulse 86, temperature 97.7 F (36.5 C), temperature source Oral, resp. rate 18, height _0  (1.6 m), weight 76.9 kg (169 lb 8 oz), SpO2 100 %.   Pt is alert and oriented, pleasant elderly woman in NAD HEENT: normal Neck: JVP - normal Lungs: CTA bilaterally CV: Irregularly irregular without murmur or gallop Abd: soft, NT, Positive BS, no hepatomegaly Ext: no C/C/E, distal pulses intact and equal Skin: warm/dry no rash   Follow-up Information    BADGER,MICHAEL C, MD. Schedule an appointment as soon as possible for a visit in 2 day(s).   Specialty:  Family Medicine Why:  Hospital follow-up Contact information: [place] [address] [phone]        Dorris Carnes, MD. Schedule an appointment as soon as possible for a visit in 3 day(s).   Specialty:  Cardiology Why:  Hospital follow-up Contact information: [address] [place]  [address] [phone]           Signed: Reyne Dumas 03/10/2016, 10:58 AM        Time spent >45 mins

## 2016-03-10 NOTE — Clinical Social Work Note (Signed)
Clinical Social Work Assessment  Patient Details  Name: [name] MRN: [id number] Date of Birth: 08-10-1923  Date of referral:  03/10/16               Reason for consult:  Facility Placement                Permission sought to share information with:  Family Supports Permission granted to share information::  Yes, Verbal Permission Granted  Name::     Rocklyn Mayberry  Relationship::  Son  Contact Information:  [phone]  Housing/Transportation Living arrangements for the past 2 months:  Single Family Home Source of Information:  Patient Patient Interpreter Needed:  None Criminal Activity/Legal Involvement Pertinent to Current Situation/Hospitalization:  No - Comment as needed Significant Relationships:  Adult Children, Other Family Members, Friend Lives with:  Self Do you feel safe going back to the place where you live?  Yes Need for family participation in patient care:  Yes (Comment)  Care giving concerns:  No family/friends currently at bedside.  Patient does not express any family concerns at this time.   Social Worker assessment / plan:  Holiday representative met with patient at bedside to offer support and discuss patient needs at discharge.  Patient states that she lives at home alone and feels that she needs more assistance and no one available to provide 24/7 support.  Patient is agreeable with SNF placement and has preference to [facility].  CSW initiated referral and contacted [facility] to confirm patient request for placement.  [facility] is able to offer the bed with the understanding that patient is ready for discharge this weekend.  CSW remains available for support and to facilitate patient discharge needs once medically stable.  Employment status:  Retired Nurse, adult PT Recommendations:  [place] / Referral to community resources:  [place]  Patient/Family's Response to care:   Patient verbalized understanding and appreciation for CSW role and support.  Patient has communicated with family and feels that [facility] would be the best facility for her at discharge.  Patient/Family's Understanding of and Emotional Response to Diagnosis, Current Treatment, and Prognosis:  Patient presents with a good view of barriers and limitations for immediate return home.  Patient is hopeful that she will be able to return home long term and this is a short term solution.  Emotional Assessment Appearance:  Appears younger than stated age Attitude/Demeanor/Rapport:   (Appropriate and Engaged) Affect (typically observed):  Accepting, Appropriate, Blunt Orientation:  Oriented to Self, Oriented to Situation, Oriented to Place, Oriented to  Time Alcohol / Substance use:  Not Applicable Psych involvement (Current and /or in the community):  No (Comment)  Discharge Needs  Concerns to be addressed:  No discharge needs identified Readmission within the last 30 days:  No Current discharge risk:  Lives alone Barriers to Discharge:  Continued Medical Work up, Unsafe home situation   [place], [place]

## 2016-03-10 NOTE — NC FL2 (Signed)
[name] LEVEL OF CARE SCREENING TOOL     IDENTIFICATION  Patient Name: [name] Birthdate: Jun 12, 1924 Sex: female Admission Date (Current Location): 03/06/2016  [facility] and [place] Number:  Herbalist and Address:  [facility], [place] [address] [place], [place] [address]      Provider Number: [id number]  Attending Physician Name and Address:  Reyne Dumas, MD  Relative Name and Phone Number:       Current Level of Care: Hospital Recommended Level of Care: [place] Prior Approval Number:    Date Approved/Denied:   PASRR Number: [id number]  Discharge Plan: SNF    Current Diagnoses: Patient Active Problem List   Diagnosis Date Noted  . PVC's (premature ventricular contractions) 03/07/2016  . Depression 03/06/2016  . Chest pain at rest 03/06/2016  . Unstable angina ([place]) 10/24/2015  . Chronic anticoagulation for A fib, CHA2DS2VASc = 6 12/18/2014  . Chest pain, negative MI, negative Nuc, may be GI 12/17/2014  . Diabetes mellitus type II, non insulin dependent ([place])   . HTN (hypertension)   . Tachycardia 08/07/2011  . Atrial fibrillation, permanent 06/25/2011  . Near Syncope 06/06/2011  . Coronary artery disease 04/05/2011  . Bradycardia 12/09/2009  . ACUTE BRONCHITIS 06/17/2009  . C O P D 06/17/2009  . PULMONARY NODULE, RIGHT UPPER LOBE 06/07/2009  . SHORTNESS OF BREATH (SOB) 06/07/2009  . POSTURAL HYPOTENSION 05/23/2009  . DEHYDRATION 05/13/2009  . ABDOMINAL PAIN, LOWER 01/21/2009  . HOARSENESS, CHRONIC 12/03/2007  . GOITER 09/30/2007  . DIABETIC PERIPHERAL NEUROPATHY 09/30/2007  . OBESITY 09/30/2007  . HIATAL HERNIA 09/30/2007  . OSTEOARTHRITIS 09/30/2007  . SPINAL STENOSIS 09/30/2007  . PERIPHERAL EDEMA 09/30/2007  . HLD (hyperlipidemia) 02/12/2007  . ANEMIA-NOS 02/12/2007  . PERIPHERAL NEUROPATHY 02/12/2007  . Allergic rhinitis, cause unspecified 02/12/2007  . GERD 02/12/2007  .  DIVERTICULOSIS, COLON 02/12/2007    Orientation RESPIRATION BLADDER Height & Weight     Self, Time, Situation, Place  O2 (3L) Continent Weight: 169 lb 8 oz (76.9 kg) (pt unable to stand) Height:  5\' 3"  (160 cm)  BEHAVIORAL SYMPTOMS/MOOD NEUROLOGICAL BOWEL NUTRITION STATUS      Continent Diet (Carb Modified / Thin Liquids)  AMBULATORY STATUS COMMUNICATION OF NEEDS Skin   Extensive Assist Verbally Normal                       Personal Care Assistance Level of Assistance  Bathing, Feeding, Dressing Bathing Assistance: Limited assistance Feeding assistance: Independent Dressing Assistance: Limited assistance     Functional Limitations Info  Sight, Hearing, Speech Sight Info: Adequate Hearing Info: Adequate Speech Info: Adequate    SPECIAL CARE FACTORS FREQUENCY  PT (By licensed PT), OT (By licensed OT)     PT Frequency: 3 OT Frequency: 3            Contractures Contractures Info: Not present    Additional Factors Info  Code Status, Allergies, Insulin Sliding Scale Code Status Info: Full Code Allergies Info: Amoxicillin   Insulin Sliding Scale Info: Novolog 3 times daily with meals       Current Medications (03/10/2016):  This is the current hospital active medication list Current Facility-Administered Medications  Medication Dose Route Frequency Provider Last Rate Last Dose  . acetaminophen (TYLENOL) tablet 650 mg  650 mg Oral Q4H PRN Vianne Bulls, MD   650 mg at 03/08/16 1020  . albuterol (PROVENTIL) (2.5 MG/3ML) 0.083% nebulizer solution 2.5  mg  2.5 mg Nebulization Q4H PRN Ilene Qua Opyd, MD      . amLODipine (NORVASC) tablet 5 mg  5 mg Oral Daily Vianne Bulls, MD   5 mg at 03/10/16 0958  . apixaban (ELIQUIS) tablet 5 mg  5 mg Oral BID Vianne Bulls, MD   5 mg at 03/10/16 0958  . atropine 1 MG/10ML injection 0.5 mg  0.5 mg Intravenous Once Reyne Dumas, MD      . fluticasone (FLONASE) 50 MCG/ACT nasal spray 1 spray  1 spray Each Nare Daily PRN Vianne Bulls, MD      . gabapentin (NEURONTIN) tablet 600 mg  600 mg Oral BID Vianne Bulls, MD   600 mg at 03/10/16 0959  . glyBURIDE (DIABETA) tablet 2.5 mg  2.5 mg Oral BID WC Reyne Dumas, MD   2.5 mg at 03/10/16 0959  . HYDROcodone-acetaminophen (NORCO/VICODIN) 5-325 MG per tablet 1 tablet  1 tablet Oral Q4H PRN Vianne Bulls, MD   1 tablet at 03/09/16 0201  . insulin aspart (novoLOG) injection 0-9 Units  0-9 Units Subcutaneous TID WC Vianne Bulls, MD   1 Units at 03/09/16 0800  . morphine 2 MG/ML injection 1 mg  1 mg Intravenous Q2H PRN Vianne Bulls, MD      . multivitamin (PROSIGHT) tablet 1 tablet  1 tablet Oral Daily Vianne Bulls, MD   1 tablet at 03/10/16 0958  . nitroGLYCERIN (NITROSTAT) SL tablet 0.4 mg  0.4 mg Sublingual Q5 min PRN Vianne Bulls, MD      . ondansetron (ZOFRAN) injection 4 mg  4 mg Intravenous Q6H PRN Vianne Bulls, MD      . oxybutynin (DITROPAN) tablet 5 mg  5 mg Oral Daily Vianne Bulls, MD   5 mg at 03/10/16 0959  . pantoprazole (PROTONIX) EC tablet 40 mg  40 mg Oral BID Vianne Bulls, MD   40 mg at 03/10/16 0958  . polyethylene glycol (MIRALAX / GLYCOLAX) packet 17 g  17 g Oral Daily Vianne Bulls, MD   17 g at 03/10/16 0959  . rosuvastatin (CRESTOR) tablet 5 mg  5 mg Oral QHS Vianne Bulls, MD   5 mg at 03/09/16 2115  . technetium TC 37M diethylenetriame-pentaacetic acid (DTPA) injection 30 millicurie  30 millicurie Intravenous Once PRN Lajean Manes, MD         Discharge Medications: Please see discharge summary for a list of discharge medications.  Relevant Imaging Results:  Relevant Lab Results:   Additional Information [id number]   [name], [place]

## 2016-03-10 NOTE — Clinical Social Work Placement (Signed)
   CLINICAL SOCIAL WORK PLACEMENT  NOTE  Date:  03/10/2016  Patient Details  Name: [name] MRN: [id number] Date of Birth: 1923/12/19  Clinical Social Work is seeking post-discharge placement for this patient at the [place] level of care (*CSW will initial, date and re-position this form in  chart as items are completed):  Yes   Patient/family provided with [facility] Work Department's list of facilities offering this level of care within the geographic area requested by the patient (or if unable, by the patient's family).  Yes   Patient/family informed of their freedom to choose among providers that offer the needed level of care, that participate in Medicare, Medicaid or managed care program needed by the patient, have an available bed and are willing to accept the patient.  Yes   Patient/family informed of [facility]'s ownership interest in [facility] and [facility], as well as of the fact that they are under no obligation to receive care at these facilities.  PASRR submitted to EDS on       PASRR number received on       Existing PASRR number confirmed on 03/10/16     FL2 transmitted to all facilities in geographic area requested by pt/family on 03/10/16     FL2 transmitted to all facilities within larger geographic area on       Patient informed that his/her managed care company has contracts with or will negotiate with certain facilities, including the following:        Yes   Patient/family informed of bed offers received.  Patient chooses bed at [facility]     Physician recommends and patient chooses bed at      Patient to be transferred to [facility] on 03/10/16.  Patient to be transferred to facility by Ambulance     Patient family notified on   of transfer.  Name of family member notified:        PHYSICIAN       Additional Comment:   [name], [place]

## 2016-03-10 NOTE — Progress Notes (Signed)
Attempted to ambulate pt and to help her with the bedside Pt states she can't stand, her legs hurt. Had helped her to the bedside commode but she was unable to carry her weight. Ended up with letting her use the bedpan.

## 2016-03-11 ENCOUNTER — Inpatient Hospital Stay (HOSPITAL_COMMUNITY): Payer: Medicare Other

## 2016-03-11 DIAGNOSIS — M79606 Pain in leg, unspecified: Secondary | ICD-10-CM

## 2016-03-11 DIAGNOSIS — R071 Chest pain on breathing: Secondary | ICD-10-CM

## 2016-03-11 LAB — GLUCOSE, CAPILLARY
GLUCOSE-CAPILLARY: 193 mg/dL — AB (ref 65–99)
Glucose-Capillary: 139 mg/dL — ABNORMAL HIGH (ref 65–99)
Glucose-Capillary: 186 mg/dL — ABNORMAL HIGH (ref 65–99)
Glucose-Capillary: 218 mg/dL — ABNORMAL HIGH (ref 65–99)
Glucose-Capillary: 190 mg/dL — ABNORMAL HIGH (ref 65–99)

## 2016-03-11 LAB — URIC ACID: URIC ACID, SERUM: 5.1 mg/dL (ref 2.3–6.6)

## 2016-03-11 MED ORDER — COLCHICINE 0.6 MG PO TABS
0.6000 mg | ORAL_TABLET | Freq: Two times a day (BID) | ORAL | Status: DC
  Administered 2016-03-11 – 2016-03-13 (×4): 0.6 mg via ORAL
  Filled 2016-03-11 (×4): qty 1

## 2016-03-11 NOTE — Progress Notes (Signed)
*  PRELIMINARY RESULTS* Vascular Ultrasound Lower extremity venous duplex has been completed.  Preliminary findings: no evidence of DVT in visualized veins bilaterally. Bilateral baker's cyst noted.   Landry Mellow, RDMS, RVT  03/11/2016, 2:28 PM

## 2016-03-11 NOTE — Discharge Summary (Addendum)
Physician Discharge Summary  JASALYNN PFEIL MRN: [id number] DOB/AGE: 27-Dec-1923 82 y.o.  PCP: Chesley Noon, MD   Admit date: 03/06/2016 Discharge date: 03/11/2016  Discharge Diagnoses:    Active Problems:   Bradycardia   GERD   Coronary artery disease   Atrial fibrillation, permanent   Diabetes mellitus type II, non insulin dependent (HCC)   HTN (hypertension)   Chest pain, negative MI, negative Nuc, may be GI   Depression   Chest pain at rest   PVC's (premature ventricular contractions)   Addendum-still unable to bear weight on the left knee, MRI of the left knee ordered. Unable to discharge to the nursing home  Follow-up recommendations Follow-up with PCP in 3-5 days , including all  additional recommended appointments as below Follow-up CBC, CMP in 3-5 days  patient extremely sensitive to beta blockers and these should never be prescribed, Patient may need up to 2 L of oxygen with ambulation, that can be slowly weaned    Current Discharge Medication List    CONTINUE these medications which have NOT CHANGED   Details  albuterol (PROVENTIL HFA;VENTOLIN HFA) 108 (90 Base) MCG/ACT inhaler Inhale 2 puffs into the lungs every 6 (six) hours as needed for wheezing or shortness of breath.     alendronate (FOSAMAX) 70 MG tablet TAKE 1 TABLET BY MOUTH EVERY 7 DAYS ON TUESDAYS WITH A FULL GLASS OF WATER & ON AN EMPTY STOMACH Qty: 4 tablet, Refills: 6    amLODipine (NORVASC) 5 MG tablet Take 5 mg by mouth daily.    apixaban (ELIQUIS) 5 MG TABS tablet Take 1 tablet (5 mg total) by mouth 2 (two) times daily.    Calcium Carbonate-Vit D-Min (CALCIUM 1200 PO) Take 1 tablet by mouth daily.     fluticasone (FLONASE) 50 MCG/ACT nasal spray Place 1 spray into both nostrils daily as needed for allergies.     gabapentin (NEURONTIN) 600 MG tablet Take 600 mg by mouth 2 (two) times daily.     glipiZIDE (GLUCOTROL XL) 5 MG 24 hr tablet Take 5 mg by mouth every evening.     !!  Multiple Vitamins-Minerals (CENTRUM SILVER PO) Take 1 tablet by mouth daily.     !! Multiple Vitamins-Minerals (PRESERVISION AREDS PO) Take 1 tablet by mouth 2 (two) times daily.    nitroGLYCERIN (NITROSTAT) 0.4 MG SL tablet Place 0.4 mLs under the tongue every 5 (five) minutes as needed for chest pain (Max 3 doses).     ondansetron (ZOFRAN) 8 MG tablet Take 1 tablet (8 mg total) by mouth every 8 (eight) hours as needed for nausea or vomiting. Qty: 20 tablet, Refills: 0    oxybutynin (DITROPAN) 5 MG tablet Take 5 mg by mouth daily.    pantoprazole (PROTONIX) 40 MG tablet Take 1 tablet (40 mg total) by mouth 2 (two) times daily. Qty: 60 tablet, Refills: 11    rosuvastatin (CRESTOR) 5 MG tablet Take 5 mg by mouth at bedtime.     !! - Potential duplicate medications found. Please discuss with provider.    STOP taking these medications     traMADol (ULTRAM) 50 MG tablet          Discharge Condition: Stable  Discharge Instructions Get Medicines reviewed and adjusted: Please take all your medications with you for your next visit with your Primary MD  Please request your Primary MD to go over all hospital tests and procedure/radiological results at the follow up, please ask your Primary MD to get all [facility]  records sent to his/her office.  If you experience worsening of your admission symptoms, develop shortness of breath, life threatening emergency, suicidal or homicidal thoughts you must seek medical attention immediately by calling 911 or calling your MD immediately if symptoms less severe.  You must read complete instructions/literature along with all the possible adverse reactions/side effects for all the Medicines you take and that have been prescribed to you. Take any new Medicines after you have completely understood and accpet all the possible adverse reactions/side effects.   Do not drive when taking Pain medications.   Do not take more than prescribed Pain, Sleep and  Anxiety Medications  Special Instructions: If you have smoked or chewed Tobacco in the last 2 yrs please stop smoking, stop any regular Alcohol and or any Recreational drug use.  Wear Seat belts while driving.  Please note  You were cared for by a hospitalist during your hospital stay. Once you are discharged, your primary care physician will handle any further medical issues. Please note that NO REFILLS for any discharge medications will be authorized once you are discharged, as it is imperative that you return to your primary care physician (or establish a relationship with a primary care physician if you do not have one) for your aftercare needs so that they can reassess your need for medications and monitor your lab values.  Discharge Instructions    Diet - low sodium heart healthy    Complete by:  As directed   Diet - low sodium heart healthy    Complete by:  As directed   Diet - low sodium heart healthy    Complete by:  As directed   Increase activity slowly    Complete by:  As directed   Increase activity slowly    Complete by:  As directed   Increase activity slowly    Complete by:  As directed       Allergies  Allergen Reactions  . Amoxicillin Other (See Comments)    UNKNOWN      Disposition: SNF   Consults:  Gen. cardiology EP     Significant Diagnostic Studies:  Dg Chest 2 View  Result Date: 03/10/2016 CLINICAL DATA:  Pt reports SOB x 4 days; h/o DM, HTN, and PNA; non-smoker EXAM: CHEST  2 VIEW COMPARISON:  03/06/2016 FINDINGS: Lungs are hyperexpanded but clear. No pleural effusion or pneumothorax. Cardiac silhouette is borderline enlarged. No mediastinal or hilar masses or evidence of adenopathy. Bony thorax is demineralized but intact. IMPRESSION: No acute cardiopulmonary disease. Electronically Signed   By: Lajean Manes M.D.   On: 03/10/2016 08:15   Dg Chest 2 View  Result Date: 03/06/2016 CLINICAL DATA:  Left-sided chest pain over the last 1 day. EXAM:  CHEST  2 VIEW COMPARISON:  Two-view chest x-ray 10/24/2015 FINDINGS: The heart is mildly enlarged. Atherosclerotic calcifications are present at the the aortic arch. Chronic interstitial coarsening is similar the prior exam. Mild bibasilar airspace disease likely reflects atelectasis. There is no edema or effusion to suggest failure. No significant airspace consolidation is present. Degenerate changes of the thoracic spine are stable. The visualized soft tissues and bony thorax are otherwise unremarkable. IMPRESSION: 1. Borderline cardiomegaly without failure. 2. Atherosclerosis of the thoracic aorta. 3. Mild bibasilar airspace disease likely reflects atelectasis. Electronically Signed   By: San Morelle M.D.   On: 03/06/2016 18:14   Nm Pulmonary Perf And Vent  Result Date: 03/10/2016 CLINICAL DATA:  Shortness of Breath EXAM: NUCLEAR MEDICINE VENTILATION -  PERFUSION LUNG SCAN Views: Anterior, posterior, left lateral, right lateral, RPO, LPO, RAO, LAO -ventilation and perfusion RADIOPHARMACEUTICALS:  30.0 mCi Technetium-31m DTPA aerosol inhalation and 4.06 mCi Technetium-36m MAA IV COMPARISON:  Chest radiograph March 10, 2016 FINDINGS: Ventilation: Radiotracer uptake is homogeneous and symmetric bilaterally. No focal ventilation defects identified. Perfusion: Radiotracer uptake is homogeneous and symmetric bilaterally. No perfusion defects are evident. IMPRESSION: No ventilation or perfusion defects. This study is regarded as within normal limits. Electronically Signed   By: Lowella Grip III M.D.   On: 03/10/2016 08:20   US Abdomen Limited Ruq  Result Date: 03/07/2016 CLINICAL DATA:  Abdominal pain for 1 day, status postcholecystectomy EXAM: US ABDOMEN LIMITED - RIGHT UPPER QUADRANT COMPARISON:  CT scan 12/30/2015 FINDINGS: Gallbladder: Surgically absent Common bile duct: Diameter: 5.4 mm in diameter within normal limits Liver: No focal lesion identified. Within normal limits in parenchymal  echogenicity. No intrahepatic biliary ductal dilatation. IMPRESSION: 1. Surgical absent gallbladder.  Normal CBD.  No focal hepatic mass. Electronically Signed   By: Lahoma Crocker M.D.   On: 03/07/2016 15:29      2-D echo  LV EF: 60% -   65%  ------------------------------------------------------------------- Indications:      Chest pain 786.51.  ------------------------------------------------------------------- History:   PMH:  First Degree AV Block. Right Bundle Branch Block. Atrial fibrillation.  Coronary artery disease.  Risk factors: Hypertension. Diabetes mellitus. Dyslipidemia.  ------------------------------------------------------------------- Study Conclusions  - Left ventricle: The cavity size was normal. Wall thickness was   normal. Systolic function was normal. The estimated ejection   fraction was in the range of 60% to 65%. The study is not   technically sufficient to allow evaluation of LV diastolic   function. - Aortic valve: There was mild regurgitation. - Left atrium: The atrium was mildly dilated. - Atrial septum: There was a patent foramen ovale. - Pulmonary arteries: PA peak pressure: 35 mm Hg (S).   Filed Weights   03/09/16 0527 03/10/16 0551 03/11/16 0230  Weight: 78.8 kg (173 lb 11.2 oz) 76.9 kg (169 lb 8 oz) 79.5 kg (175 lb 4.8 oz)     Microbiology: No results found for this or any previous visit (from the past 240 hour(s)).     Blood Culture    Component Value Date/Time   SDES URINE, RANDOM 09/21/2012 0525   SPECREQUEST ADDED [id number] 09/21/2012 0525   CULT  09/21/2012 0525    Multiple bacterial morphotypes present, none predominant. Suggest appropriate recollection if clinically indicated.   REPTSTATUS 09/22/2012 FINAL 09/21/2012 0525      Labs: Results for orders placed or performed during the hospital encounter of 03/06/16 (from the past 48 hour(s))  Glucose, capillary     Status: Abnormal   Collection Time: 03/09/16 11:33 AM   Result Value Ref Range   Glucose-Capillary 166 (H) 65 - 99 mg/dL   Comment 1 Notify RN    Comment 2 Document in Chart   Glucose, capillary     Status: Abnormal   Collection Time: 03/09/16  4:30 PM  Result Value Ref Range   Glucose-Capillary 178 (H) 65 - 99 mg/dL   Comment 1 Notify RN    Comment 2 Document in Chart   D-dimer, quantitative (not at [facility])     Status: Abnormal   Collection Time: 03/09/16  7:13 PM  Result Value Ref Range   D-Dimer, Quant 1.37 (H) 0.00 - 0.50 ug/mL-FEU    Comment: (NOTE) At the manufacturer cut-off of 0.50 ug/mL FEU, this assay has been  documented to exclude PE with a sensitivity and negative predictive value of 97 to 99%.  At this time, this assay has not been approved by the FDA to exclude DVT/VTE. Results should be correlated with clinical presentation.   Glucose, capillary     Status: Abnormal   Collection Time: 03/09/16  8:46 PM  Result Value Ref Range   Glucose-Capillary 172 (H) 65 - 99 mg/dL  Glucose, capillary     Status: Abnormal   Collection Time: 03/10/16 11:07 AM  Result Value Ref Range   Glucose-Capillary 139 (H) 65 - 99 mg/dL  Glucose, capillary     Status: Abnormal   Collection Time: 03/10/16  4:36 PM  Result Value Ref Range   Glucose-Capillary 133 (H) 65 - 99 mg/dL  Glucose, capillary     Status: None   Collection Time: 03/10/16  8:22 PM  Result Value Ref Range   Glucose-Capillary 95 65 - 99 mg/dL  Glucose, capillary     Status: Abnormal   Collection Time: 03/11/16  7:31 AM  Result Value Ref Range   Glucose-Capillary 139 (H) 65 - 99 mg/dL  Glucose, capillary     Status: Abnormal   Collection Time: 03/11/16 11:10 AM  Result Value Ref Range   Glucose-Capillary 186 (H) 65 - 99 mg/dL     Lipid Panel     Component Value Date/Time   CHOL 145 10/25/2015 0329   TRIG 84 10/25/2015 0329   HDL 52 10/25/2015 0329   CHOLHDL 2.8 10/25/2015 0329   VLDL 17 10/25/2015 0329   LDLCALC 76 10/25/2015 0329     Lab Results  Component  Value Date   HGBA1C 6.0 (H) 10/24/2015   HGBA1C 6.0 06/07/2014   HGBA1C 5.8 (H) 02/05/2013        HPI :  [name] is a 82 y.o. female with PMhx of CAD ((DES to mLAD 2005, cutting balloon angioplasty due to ISR in 2006), Permanent AFib, DM, HTN, HLD, GERD, IBS, spinal stenosis, anemia, multinodular goiter, known RBBB, CKD stage III, was brought to [facility] by EMS secondary to CP. After several hours of persistent discomfort, she called EMS. She received 4 baby ASA, as well as 2 SL NTG without significant relief. She says it was only until she went for her CXR did symptoms finally ease off spontaneously (6pm).    She was seen in consult by cardiology service, dr. Harrington Challenger who did not suspect a cardiac etiology and has r/o with neg Trop x3.  She was apparently started on BB here for her HTN apparently by H&P note receiving a total of 2 dose of metoprolol 12.5mg , last dose 0849 yesterday.  The patient last evening was on the bedside commode urinating when she had a syncopal event, she was placed back in bed an given brief compressions with ROSC, telemetry noted pause of 7.8 seconds  She was seen by cardiology NP, suspected micturation syncope/vagal event, her BB held and EP asked to evaluate.  The patient feels very well this morning, denies any ongoing c/o CP since her arrival, no palpitations, no SOB.  She reports nothing like last PM has ever happened to her before.  She felt lightheaded a second or so prior to fainting.  She has never had any near syncope or syncope historically.  HOSPITAL COURSE:     Chest pain  - Atypical in that in occurred at rest and not exacerbated by activity; Normal nuclear scan in April 2017. No further assessment recommended at this time.  This is likely noncardiac - Initial EKG not significantly and initial troponin 0.01; CXR without acute disease  - Received 2 doses of SL NTG and ASA 324 mg en route Telemetry EKG, troponin negative, Right upper quadrant  ultrasound to rule out common bile duct stone, liver function, lipase - Lopressor started for HTN; discontinued due to syncope related to vasovagal episode with a long pause, continue Crestor  Continue treatment for GERD, continue PPI 2-D echo results as above  code blue/7.8 second pause Associated with loss of consciousness,, likely vagal event, the patient states she was "holding it"  for a few minutes waiting to get to the commode, and once on the commode started to urinate and fainted.  Appeared to become brady prior to the pause and had profound bradycardia afterwards for approx 20seconds. Brief CPR was performed  Otherwise baseline rhythm is atrial fibrillation Beta blocker was discontinued and the patient was ordered to receive one dose of atropine, but never given EP was consulted, and followed her during this hospitalization, as per cardiology no indication for pacemaker  Hypoxemia-likely secondary to atelectasis Chest x-ray negative, VQ scan negative, 2-D echo within normal limits Continue to wean oxygen   CAD - DES to mLAD in 2005 and subsequent angioplasty for in-stent restenosis  - LHC (February 2012) with LAD 40%, patent stent, prox &mid RCA 30%, and EF 60%  - Evaluating for ACS as above  - Continue Crestor , beta blocker, aspirin discontinued prior to discharge as the patient was not on aspirin and is currently on eliquis      Chronic atrial fibrillation  - In rate-controlled a fib on admission  - CHADS-VASc is at least 35 (age x2, gender, CAD, HTN, DM) - Continue AC with Eliquis 5 mg BID     Type II DM  - A1c 6.0% in April 2017  - Managed at home with glipizide only, resume  Stable CBGs     Hypertension  - Not on any antihypertensives at time of admission  - There is diastolic HTN in ED   Did not tolerate beta blockers    Depression  - Stable, continue Lexapro   GERD  - Managed with BID Protonix at home, will continue      Left leg pain Patient  complaining of inability to walk due to left knee pain, plain films of the left knee showed severe OA  and the right hip were negative , venous Doppler to rule out DVT was negative    Discharge Exam:   Blood pressure 121/64, pulse 73, temperature 98.5 F (36.9 C), temperature source Axillary, resp. rate 18, height 5\' 3"  (1.6 m), weight 79.5 kg (175 lb 4.8 oz), SpO2 100 %.   Pt is alert and oriented, pleasant elderly woman in NAD HEENT: normal Neck: JVP - normal Lungs: CTA bilaterally CV: Irregularly irregular without murmur or gallop Abd: soft, NT, Positive BS, no hepatomegaly Ext: no C/C/E, distal pulses intact and equal Skin: warm/dry no rash   Follow-up Information    BADGER,MICHAEL C, MD. Schedule an appointment as soon as possible for a visit in 2 day(s).   Specialty:  Family Medicine Why:  Hospital follow-up Contact information: [place] [address] [phone]        Dorris Carnes, MD. Schedule an appointment as soon as possible for a visit in 3 day(s).   Specialty:  Cardiology Why:  Hospital follow-up Contact information: [place] [address] [phone]  SignedReyne Dumas 03/11/2016, 11:30 AM        Time spent >45 mins

## 2016-03-11 NOTE — Clinical Social Work Placement (Addendum)
   CLINICAL SOCIAL WORK PLACEMENT  NOTE  Date:  03/11/2016  Patient Details  Name: [name] MRN: [id number] Date of Birth: 01/21/1924  Clinical Social Work is seeking post-discharge placement for this patient at the [place] level of care (*CSW will initial, date and re-position this form in  chart as items are completed):  Yes   Patient/family provided with [facility] Work Department's list of facilities offering this level of care within the geographic area requested by the patient (or if unable, by the patient's family).  Yes   Patient/family informed of their freedom to choose among providers that offer the needed level of care, that participate in Medicare, Medicaid or managed care program needed by the patient, have an available bed and are willing to accept the patient.  Yes   Patient/family informed of [facility]'s ownership interest in [facility] and [facility], as well as of the fact that they are under no obligation to receive care at these facilities.  PASRR submitted to EDS on       PASRR number received on       Existing PASRR number confirmed on 03/10/16     FL2 transmitted to all facilities in geographic area requested by pt/family on 03/10/16     FL2 transmitted to all facilities within larger geographic area on       Patient informed that his/her managed care company has contracts with or will negotiate with certain facilities, including the following:        Yes   Patient/family informed of bed offers received.  Patient chooses bed at [facility]     Physician recommends and patient chooses bed at      Patient to be transferred to [facility] on 03/11/2016.  Patient to be transferred to facility by Ambulance     Patient family notified on 03/11/16 of transfer.  Name of family member notified:  Patient to notify family per her request     PHYSICIAN       Additional Comment:    [name],  [place]

## 2016-03-11 NOTE — Progress Notes (Signed)
Attempted to assist patient to stand at bedside.  Patient was able to get to sitting position with minimal assistance, moving her legs.  She was not able to put pressure on her left foot to stand.  Assisted her with putting on her shoes and stockings and she was still unable to apply pressure to her left foot.  Patient was screaming in pain while attempting to stand.  The top of patient's left foot is red.  Dr. Allyson Sabal paged and notified. Orders received.  Primary Nurse [place] notified.  Sanda Linger

## 2016-03-11 NOTE — Progress Notes (Deleted)
Patient stable for discharge, no change in discharge summary at this time

## 2016-03-12 ENCOUNTER — Inpatient Hospital Stay (HOSPITAL_COMMUNITY): Payer: Medicare Other

## 2016-03-12 DIAGNOSIS — R072 Precordial pain: Secondary | ICD-10-CM

## 2016-03-12 LAB — GLUCOSE, CAPILLARY
Glucose-Capillary: 135 mg/dL — ABNORMAL HIGH (ref 65–99)
Glucose-Capillary: 202 mg/dL — ABNORMAL HIGH (ref 65–99)
Glucose-Capillary: 169 mg/dL — ABNORMAL HIGH (ref 65–99)
Glucose-Capillary: 166 mg/dL — ABNORMAL HIGH (ref 65–99)

## 2016-03-12 LAB — COMPREHENSIVE METABOLIC PANEL
ALBUMIN: 2.9 g/dL — AB (ref 3.5–5.0)
ALT: 18 U/L (ref 14–54)
AST: 20 U/L (ref 15–41)
Alkaline Phosphatase: 48 U/L (ref 38–126)
Anion gap: 6 (ref 5–15)
BUN: 19 mg/dL (ref 6–20)
CHLORIDE: 100 mmol/L — AB (ref 101–111)
CO2: 30 mmol/L (ref 22–32)
Calcium: 8.8 mg/dL — ABNORMAL LOW (ref 8.9–10.3)
Creatinine, Ser: 1.11 mg/dL — ABNORMAL HIGH (ref 0.44–1.00)
GFR calc Af Amer: 49 mL/min — ABNORMAL LOW (ref >60)
GFR calc non Af Amer: 42 mL/min — ABNORMAL LOW (ref >60)
Glucose, Bld: 231 mg/dL — ABNORMAL HIGH (ref 65–99)
Potassium: 4.6 mmol/L (ref 3.5–5.1)
SODIUM: 136 mmol/L (ref 135–145)
TOTAL PROTEIN: 5.9 g/dL — AB (ref 6.5–8.1)
Total Bilirubin: 0.9 mg/dL (ref 0.3–1.2)

## 2016-03-12 NOTE — Progress Notes (Addendum)
Report received via Paulette RN  in patient's room using SBAR format, reviewed orders, VS, meds, labs and patient's general condition, assumed care od patient.

## 2016-03-12 NOTE — Care Management Important Message (Signed)
Important Message  Patient Details  Name: [name] MRN: [id number] Date of Birth: 1923/07/24   Medicare Important Message Given:  Yes    Dunbar Buras Abena 03/12/2016, 11:12 AM

## 2016-03-12 NOTE — Progress Notes (Addendum)
[facility] PROGRESS NOTE  [name] [id number] DOB: 25-Jun-1924 DOA: 03/06/2016   PCP: Chesley Noon, MD     Assessment/Plan: Active Problems:   Bradycardia   GERD   Coronary artery disease   Atrial fibrillation, permanent   Diabetes mellitus type II, non insulin dependent (HCC)   HTN (hypertension)   Chest pain, negative MI, negative Nuc, may be GI   Depression   Chest pain at rest   PVC's (premature ventricular contractions)   Becky Spry Pearsonis a 82 y.o.femalewith PMhx of CAD ((DES to mLAD 2005, cutting balloon angioplasty due to ISR in 2006), Permanent AFib, DM, HTN, HLD, GERD, IBS, spinal stenosis, anemia, multinodular goiter, known RBBB, CKD stage III, was brought to [facility] by EMS secondary to CP. After several hours of persistent discomfort, she called EMS. She received 4 baby ASA, as well as 2 SL NTG without significant relief. She says it was only until she went for her CXR did symptoms finally ease off spontaneously (6pm).   She was seen in consult by cardiology service, dr. Harrington Challenger who did not suspect a cardiac etiology and has r/o with neg Trop x3. She was apparently started on BB here for her HTN apparently by H&P note receiving a total of 2 dose of metoprolol 12.5mg , last dose 0849 yesterday.  The patient last evening was on the bedside commode urinating when she had a syncopal event, she was placed back in bed an given brief compressions with ROSC, telemetry noted pause of 7.8 secondsShe was seen by cardiology NP, suspected micturation syncope/vagal event, her BB held and EP asked to evaluate.  The patient feels very well this morning, denies any ongoing c/o CP since her arrival, no palpitations, no SOB. She reports nothing like last PM has ever happened to her before. She felt lightheaded a second or so prior to fainting. She has never had any near syncope or syncope historically.  HOSPITAL COURSE:     Chest pain  - Atypical in  that in occurred at rest and not exacerbated by activity; Normal nuclear scan in April 2017. No further assessment recommended at this time. This is likely noncardiac - Initial EKG not significantly and initial troponin 0.01; CXR without acute disease  - Received 2 doses of SL NTG and ASA 324 mg en route Telemetry EKG, troponin negative, Right upper quadrant ultrasound to rule out common bile duct stone, liver function, lipase - Lopressor started for HTN; discontinued due to syncope related to vasovagal episode with a long pause, continue Crestor  Continue treatment for GERD, continue PPI 2-D echo  EF 60-65%,PA peak pressure: 35 mm Hg   codeblue/7.8 second pause Associated with loss of consciousness,, likelyvagal event, the patient states she was "holding it" for a few minutes waiting to get to the commode, and once on the commode started to urinate and fainted.Appeared to become brady prior to the pause and had profound bradycardia afterwards for approx 20seconds. Brief CPR was performed  Otherwise baseline rhythm is atrial fibrillation Beta blocker was discontinued and the patient was ordered to receive one dose of atropine, but never given EP was consulted, and followed her during this hospitalization, as per cardiology no indication for pacemaker  Hypoxemia-likely secondary to atelectasis Chest x-ray negative, VQ scan negative, 2-D echo within normal limits Continue to wean oxygen   CAD - DES to mLAD in 2005 and subsequent angioplasty for in-stent restenosis  - LHC (February 2012) with LAD 40%, patent stent,  prox &mid RCA 30%, and EF 60%  - Evaluating for ACS as above  - Continue Crestor , beta blocker, aspirin discontinued prior to discharge as the patient was not on aspirin and is currently on eliquis      Chronic atrial fibrillation  - In rate-controlled a fib on admission  - CHADS-VASc is at least 42 (age x2, gender, CAD, HTN, DM) - Continue AC with Eliquis 5 mg BID      Type II DM  - A1c 6.0% in April 2017  - Managed at home with glipizide only, resume  Stable CBGs     Hypertension  - Not on any antihypertensives at time of admission  - There is diastolic HTN in ED   Did not tolerate beta blockers    Depression  - Stable, continue Lexapro   GERD  - Managed with BID Protonix at home, will continue      Left leg pain Patient complaining of inability to walk due to left knee pain, plain films of the left knee showed severe OA  and the right hip were negative , venous Doppler to rule out DVT was negative  Subsequently developed pain in her left ankle, x-rays negative for fracture, uric acid 5.1, ankle pain has improved after being started on colchicine However still unable to bear weight on the left knee, MRI of the left knee shows  Large joint effusion with moderate synovitis and small loose, doubt hemarthrosis , unable to aspirate as she takes eliquis , will need outpatient ortho consult     DVT prophylaxsis  eliquis   Code Status:  Full code      Code Status Orders      Family Communication: Discussed in detail with the patient, all imaging results, lab results explained to the patient   Disposition Plan:  Pending further cardiology recommendations, ep to evaluate     Consultants:  Cardiology  Procedures:  None  Antibiotics: Anti-infectives    None         HPI/Subjective: Patient complaining of left ankle and left knee pain, unable to stand or bear weight  Objective: Vitals:   03/11/16 1343 03/11/16 1956 03/12/16 0444 03/12/16 0808  BP: (!) 140/50 (!) 118/53 114/60 120/72  Pulse: 97 87 83   Resp:  18 18   Temp: 99.3 F (37.4 C) 98.5 F (36.9 C) 97.7 F (36.5 C)   TempSrc: Oral Oral Oral   SpO2: 98% 100% 99%   Weight:   78.3 kg (172 lb 11.2 oz)   Height:        Intake/Output Summary (Last 24 hours) at 03/12/16 1246 Last data filed at 03/12/16 0944  Gross per 24 hour  Intake              720 ml   Output              450 ml  Net              270 ml    Exam:  Examination:  General exam: Appears calm and comfortable  Respiratory system: Clear to auscultation. Respiratory effort normal. Cardiovascular system: S1 & S2 heard, RRR. No JVD, murmurs, rubs, gallops or clicks. No pedal edema. Gastrointestinal system: Abdomen is nondistended, soft and nontender. No organomegaly or masses felt. Normal bowel sounds heard. Central nervous system: Alert and oriented. No focal neurological deficits. Extremities: Symmetric 5 x 5 power. Skin: No rashes, lesions or ulcers Psychiatry: Judgement and insight appear normal. Mood &  affect appropriate.     Data Reviewed: I have personally reviewed following labs and imaging studies  Micro Results No results found for this or any previous visit (from the past 240 hour(s)).  Radiology Reports Dg Chest 2 View  Result Date: 03/10/2016 CLINICAL DATA:  Pt reports SOB x 4 days; h/o DM, HTN, and PNA; non-smoker EXAM: CHEST  2 VIEW COMPARISON:  03/06/2016 FINDINGS: Lungs are hyperexpanded but clear. No pleural effusion or pneumothorax. Cardiac silhouette is borderline enlarged. No mediastinal or hilar masses or evidence of adenopathy. Bony thorax is demineralized but intact. IMPRESSION: No acute cardiopulmonary disease. Electronically Signed   By: Lajean Manes M.D.   On: 03/10/2016 08:15   Dg Chest 2 View  Result Date: 03/06/2016 CLINICAL DATA:  Left-sided chest pain over the last 1 day. EXAM: CHEST  2 VIEW COMPARISON:  Two-view chest x-ray 10/24/2015 FINDINGS: The heart is mildly enlarged. Atherosclerotic calcifications are present at the the aortic arch. Chronic interstitial coarsening is similar the prior exam. Mild bibasilar airspace disease likely reflects atelectasis. There is no edema or effusion to suggest failure. No significant airspace consolidation is present. Degenerate changes of the thoracic spine are stable. The visualized soft tissues and  bony thorax are otherwise unremarkable. IMPRESSION: 1. Borderline cardiomegaly without failure. 2. Atherosclerosis of the thoracic aorta. 3. Mild bibasilar airspace disease likely reflects atelectasis. Electronically Signed   By: San Morelle M.D.   On: 03/06/2016 18:14   Dg Ankle 2 Views Left  Result Date: 03/11/2016 CLINICAL DATA:  Left ankle pain, no known injury, initial encounter EXAM: LEFT ANKLE - 2 VIEW COMPARISON:  None. FINDINGS: Mild soft tissue swelling is noted about the ankle joint. No acute fracture or dislocation is seen. IMPRESSION: Soft tissue swelling without acute bony abnormality. Electronically Signed   By: Inez Catalina M.D.   On: 03/11/2016 17:51   Nm Pulmonary Perf And Vent  Result Date: 03/10/2016 CLINICAL DATA:  Shortness of Breath EXAM: NUCLEAR MEDICINE VENTILATION - PERFUSION LUNG SCAN Views: Anterior, posterior, left lateral, right lateral, RPO, LPO, RAO, LAO -ventilation and perfusion RADIOPHARMACEUTICALS:  30.0 mCi Technetium-40m DTPA aerosol inhalation and 4.06 mCi Technetium-4m MAA IV COMPARISON:  Chest radiograph March 10, 2016 FINDINGS: Ventilation: Radiotracer uptake is homogeneous and symmetric bilaterally. No focal ventilation defects identified. Perfusion: Radiotracer uptake is homogeneous and symmetric bilaterally. No perfusion defects are evident. IMPRESSION: No ventilation or perfusion defects. This study is regarded as within normal limits. Electronically Signed   By: Lowella Grip III M.D.   On: 03/10/2016 08:20   Dg Knee Left Port  Result Date: 03/11/2016 CLINICAL DATA:  Left knee pain without trauma. EXAM: PORTABLE LEFT KNEE - 1-2 VIEW COMPARISON:  None. FINDINGS: Marked medial, moderate patellofemoral/lateral compartment joint space narrowing and subchondral sclerosis. Small suprapatellar joint effusion. No acute fracture or dislocation. Vascular calcifications. IMPRESSION: Three compartment osteoarthritis with small suprapatellar joint  effusion. Electronically Signed   By: Abigail Miyamoto M.D.   On: 03/11/2016 13:16   Dg Foot 2 Views Left  Result Date: 03/11/2016 CLINICAL DATA:  Foot pain, no known injury, initial encounter EXAM: LEFT FOOT - 2 VIEW COMPARISON:  None. FINDINGS: There is no evidence of fracture or dislocation. There is no evidence of arthropathy or other focal bone abnormality. Soft tissues are unremarkable. IMPRESSION: No acute abnormality noted. Electronically Signed   By: Inez Catalina M.D.   On: 03/11/2016 17:50   Dg Hip Unilat With Pelvis 1v Right  Result Date: 03/11/2016 CLINICAL DATA:  Pt  c/o right hip pain and left leg pain from the knee down to the foot. No trauma history submitted. EXAM: DG HIP (WITH OR WITHOUT PELVIS) 1V RIGHT COMPARISON:  None. FINDINGS: AP view the pelvis and frog-leg view the right hip. Femoral heads are located. Degenerative disc disease at the lumbosacral junction is suboptimally evaluated. Patient is rotated on the pelvic radiograph. Joint spaces are maintained for age. No fracture identified. IMPRESSION: No acute osseous abnormality. Electronically Signed   By: Abigail Miyamoto M.D.   On: 03/11/2016 14:19   US Abdomen Limited Ruq  Result Date: 03/07/2016 CLINICAL DATA:  Abdominal pain for 1 day, status postcholecystectomy EXAM: US ABDOMEN LIMITED - RIGHT UPPER QUADRANT COMPARISON:  CT scan 12/30/2015 FINDINGS: Gallbladder: Surgically absent Common bile duct: Diameter: 5.4 mm in diameter within normal limits Liver: No focal lesion identified. Within normal limits in parenchymal echogenicity. No intrahepatic biliary ductal dilatation. IMPRESSION: 1. Surgical absent gallbladder.  Normal CBD.  No focal hepatic mass. Electronically Signed   By: Lahoma Crocker M.D.   On: 03/07/2016 15:29     CBC  Recent Labs Lab 03/06/16 1725 03/09/16 0528  WBC 6.5 8.8  HGB 13.6 13.6  HCT 41.5 41.3  PLT 226 204  MCV 95.0 95.6  MCH 31.1 31.5  MCHC 32.8 32.9  RDW 13.0 13.1    Chemistries   Recent  Labs Lab 03/06/16 1725 03/07/16 1116 03/09/16 0528 03/12/16 0907  NA 138 139 137 136  K 4.2 4.1 4.2 4.6  CL 102 105 103 100*  CO2 28 27 26 30   GLUCOSE 116* 136* 130* 231*  BUN 12 14 27* 19  CREATININE 1.14* 1.05* 1.23* 1.11*  CALCIUM 9.5 9.3 9.0 8.8*  AST  --  20 19 20   ALT  --  17 15 18   ALKPHOS  --  56 54 48  BILITOT  --  0.9 0.7 0.9   ------------------------------------------------------------------------------------------------------------------ estimated creatinine clearance is 33.4 mL/min (by C-G formula based on SCr of 1.11 mg/dL). ------------------------------------------------------------------------------------------------------------------ No results for input(s): HGBA1C in the last 72 hours. ------------------------------------------------------------------------------------------------------------------ No results for input(s): CHOL, HDL, LDLCALC, TRIG, CHOLHDL, LDLDIRECT in the last 72 hours. ------------------------------------------------------------------------------------------------------------------ No results for input(s): TSH, T4TOTAL, T3FREE, THYROIDAB in the last 72 hours.  Invalid input(s): FREET3 ------------------------------------------------------------------------------------------------------------------ No results for input(s): VITAMINB12, FOLATE, FERRITIN, TIBC, IRON, RETICCTPCT in the last 72 hours.  Coagulation profile No results for input(s): INR, PROTIME in the last 168 hours.   Recent Labs  03/09/16 1913  DDIMER 1.37*    Cardiac Enzymes  Recent Labs Lab 03/06/16 2000 03/07/16 0224 03/07/16 1116  TROPONINI <0.03 <0.03 <0.03   ------------------------------------------------------------------------------------------------------------------ Invalid input(s): POCBNP   CBG:  Recent Labs Lab 03/11/16 1407 03/11/16 1613 03/11/16 2110 03/12/16 0719 03/12/16 1128  GLUCAP 193* 218* 190* 135* 202*       Studies: Dg  Ankle 2 Views Left  Result Date: 03/11/2016 CLINICAL DATA:  Left ankle pain, no known injury, initial encounter EXAM: LEFT ANKLE - 2 VIEW COMPARISON:  None. FINDINGS: Mild soft tissue swelling is noted about the ankle joint. No acute fracture or dislocation is seen. IMPRESSION: Soft tissue swelling without acute bony abnormality. Electronically Signed   By: Inez Catalina M.D.   On: 03/11/2016 17:51   Dg Knee Left Port  Result Date: 03/11/2016 CLINICAL DATA:  Left knee pain without trauma. EXAM: PORTABLE LEFT KNEE - 1-2 VIEW COMPARISON:  None. FINDINGS: Marked medial, moderate patellofemoral/lateral compartment joint space narrowing and subchondral sclerosis. Small suprapatellar joint effusion. No  acute fracture or dislocation. Vascular calcifications. IMPRESSION: Three compartment osteoarthritis with small suprapatellar joint effusion. Electronically Signed   By: Abigail Miyamoto M.D.   On: 03/11/2016 13:16   Dg Foot 2 Views Left  Result Date: 03/11/2016 CLINICAL DATA:  Foot pain, no known injury, initial encounter EXAM: LEFT FOOT - 2 VIEW COMPARISON:  None. FINDINGS: There is no evidence of fracture or dislocation. There is no evidence of arthropathy or other focal bone abnormality. Soft tissues are unremarkable. IMPRESSION: No acute abnormality noted. Electronically Signed   By: Inez Catalina M.D.   On: 03/11/2016 17:50   Dg Hip Unilat With Pelvis 1v Right  Result Date: 03/11/2016 CLINICAL DATA:  Pt c/o right hip pain and left leg pain from the knee down to the foot. No trauma history submitted. EXAM: DG HIP (WITH OR WITHOUT PELVIS) 1V RIGHT COMPARISON:  None. FINDINGS: AP view the pelvis and frog-leg view the right hip. Femoral heads are located. Degenerative disc disease at the lumbosacral junction is suboptimally evaluated. Patient is rotated on the pelvic radiograph. Joint spaces are maintained for age. No fracture identified. IMPRESSION: No acute osseous abnormality. Electronically Signed   By: Abigail Miyamoto M.D.   On: 03/11/2016 14:19      Lab Results  Component Value Date   HGBA1C 6.0 (H) 10/24/2015   HGBA1C 6.0 06/07/2014   HGBA1C 5.8 (H) 02/05/2013   Lab Results  Component Value Date   MICROALBUR 0.81 12/08/2012   LDLCALC 76 10/25/2015   CREATININE 1.11 (H) 03/12/2016       Scheduled Meds: . amLODipine  5 mg Oral Daily  . apixaban  5 mg Oral BID  . atropine  0.5 mg Intravenous Once  . colchicine  0.6 mg Oral BID  . gabapentin  600 mg Oral BID  . glyBURIDE  2.5 mg Oral BID WC  . insulin aspart  0-9 Units Subcutaneous TID WC  . multivitamin  1 tablet Oral Daily  . oxybutynin  5 mg Oral Daily  . pantoprazole  40 mg Oral BID  . polyethylene glycol  17 g Oral Daily  . rosuvastatin  5 mg Oral QHS   Continuous Infusions:    LOS: 4 days    Time spent: >30 MINS    [facility]  [facility] Pager [phone]. If 7PM-7AM, please contact night-coverage at [email or web address], password [facility] 03/12/2016, 12:46 PM  LOS: 4 days

## 2016-03-13 LAB — GLUCOSE, CAPILLARY
Glucose-Capillary: 108 mg/dL — ABNORMAL HIGH (ref 65–99)
Glucose-Capillary: 207 mg/dL — ABNORMAL HIGH (ref 65–99)

## 2016-03-13 LAB — CBC
HEMATOCRIT: 36.9 % (ref 36.0–46.0)
Hemoglobin: 11.8 g/dL — ABNORMAL LOW (ref 12.0–15.0)
MCH: 30.8 pg (ref 26.0–34.0)
MCHC: 32 g/dL (ref 30.0–36.0)
MCV: 96.3 fL (ref 78.0–100.0)
Platelets: 192 10*3/uL (ref 150–400)
RBC: 3.83 MIL/uL — ABNORMAL LOW (ref 3.87–5.11)
RDW: 12.7 % (ref 11.5–15.5)
WBC: 8.1 10*3/uL (ref 4.0–10.5)

## 2016-03-13 MED ORDER — TRAMADOL HCL 50 MG PO TABS: 100.0000 mg | ORAL_TABLET | Freq: Every day | ORAL | 0 refills | Status: DC | PRN

## 2016-03-13 MED ORDER — COLCHICINE 0.6 MG PO TABS: 0.6000 mg | ORAL_TABLET | Freq: Two times a day (BID) | ORAL | 0 refills | Status: DC

## 2016-03-13 MED ORDER — PREDNISONE 20 MG PO TABS
50.0000 mg | ORAL_TABLET | Freq: Once | ORAL | Status: AC
  Administered 2016-03-13: 50 mg via ORAL
  Filled 2016-03-13: qty 2

## 2016-03-13 MED ORDER — PREDNISONE 50 MG PO TABS: 50.0000 mg | ORAL_TABLET | Freq: Once | ORAL | 0 refills | Status: AC

## 2016-03-13 NOTE — Clinical Social Work Placement (Signed)
   CLINICAL SOCIAL WORK PLACEMENT  NOTE  Date:  03/13/2016  Patient Details  Name: [name] MRN: [id number] Date of Birth: 30-Sep-1923  Clinical Social Work is seeking post-discharge placement for this patient at the [place] level of care (*CSW will initial, date and re-position this form in  chart as items are completed):  Yes   Patient/family provided with [facility] Work Department's list of facilities offering this level of care within the geographic area requested by the patient (or if unable, by the patient's family).  Yes   Patient/family informed of their freedom to choose among providers that offer the needed level of care, that participate in Medicare, Medicaid or managed care program needed by the patient, have an available bed and are willing to accept the patient.  Yes   Patient/family informed of [facility]'s ownership interest in [facility] and [facility], as well as of the fact that they are under no obligation to receive care at these facilities.  PASRR submitted to EDS on       PASRR number received on       Existing PASRR number confirmed on 03/10/16     FL2 transmitted to all facilities in geographic area requested by pt/family on 03/10/16     FL2 transmitted to all facilities within larger geographic area on       Patient informed that his/her managed care company has contracts with or will negotiate with certain facilities, including the following:        Yes   Patient/family informed of bed offers received.  Patient chooses bed at [facility]     Physician recommends and patient chooses bed at      Patient to be transferred to [facility] on 03/10/16.  Patient to be transferred to facility by Ambulance     Patient family notified on 03/11/16 of transfer.  Name of family member notified:  Patient to notify family per her request     PHYSICIAN Please prepare prescriptions     Additional Comment:     _______________________________________________ Candie Chroman, LCSW 03/13/2016, 11:58 AM

## 2016-03-13 NOTE — Progress Notes (Addendum)
Occupational Therapy Treatment Patient Details Name: ROBERTINE PHUNG MRN: [id number] DOB: 01-Aug-1923 Today's Date: 03/13/2016    History of present illness 82 y.o. female admitted for L sided chest pain, dyspnea, and clammy sensation. In hospital, pt had brief LOC and severe bradycardia while toileting - CPR performed and pt became responsive. PMH significant for HTN, orthostatic hypotension, HLD, CAD, PAF, 1st degree AV block, anemia, DM type II w/ neuropathy, GERD, IBS, and RA.   OT comments  Pt unable to stand/bear weight LLE for ADL's or functional transfers. Chart reviewed, pt w/ significant OA L knee w/ Bakers Cyst noted. Per MD note, to schedule Ortho consult. Change d/c plan, pt will need 24/7 supervision/assist SNF Rehab for PT/OT/transfers training and ADL's as she was Mod I and living alone prior to this admission. Pt is currently using bedpan & LB ADL's at bed level due to LLE pain. Treatment focused on bed mobility, SPT attempted x3, pt unable. Grooming at EOB and ADL's seated and bed level. Consult In-pt Rehab.   Follow Up Recommendations  SNF;Supervision/Assistance - 24 hour    Equipment Recommendations  Other (comment) (Defer to next venue)    Recommendations for Other Services  In-Pt Rehab Consult    Precautions / Restrictions Precautions Precautions: Fall Restrictions Weight Bearing Restrictions: No  +2 Assist - currently unable to bear weight LLE     Mobility Bed Mobility Overal bed mobility: Needs Assistance Bed Mobility: Supine to Sit;Sit to Supine     Supine to sit: Min guard;HOB elevated Sit to supine: Min assist (Use of rails & Min Assist w/ pad)   General bed mobility comments: Min A. Use of rails secondary to LLE pain.   Transfers Overall transfer level: Needs assistance   Transfers: Sit to/from Stand;Stand Pivot Transfers Sit to Stand:  (Pt unable - attempted x3) Stand pivot transfers:  (Pt unable- attemped x3)       General transfer  comment: Pt unable to stand/bear weight LLE for ADL's or functional transfers. Chart reviewed, pt w/ significant OA L knee w/ Bakers Cyst noted. Per MD note, to schedule Ortho consult.. Pt is currently using bedpan & LB ADL's at bed level due to LLE pain    Balance Overall balance assessment: Needs assistance Sitting-balance support: No upper extremity supported;Feet supported Sitting balance-Leahy Scale: Fair         Standing balance comment: Unable to bear weight on LLE                    ADL Overall ADL's : Needs assistance/impaired     Grooming: Wash/dry hands;Wash/dry face;Oral care;Brushing hair;Set up;Sitting Grooming Details (indicate cue type and reason): Pt unable to stand/bear weight LLE. Chart reviewed, pt w/ significant OA L knee w/ Bakers Cyst noted. Per MD note, to schedule Ortho consult. Upper Body Bathing: Set up;Sitting   Lower Body Bathing: Minimal assistance;Bed level Lower Body Bathing Details (indicate cue type and reason): Pt unable to bear weight LLE Upper Body Dressing : Set up;Sitting   Lower Body Dressing: Maximal assistance;Bed level Lower Body Dressing Details (indicate cue type and reason): Pt unable to bear weight LLE Toilet Transfer:  (Attempted transfer from EOB to chair/SPT but pt unable to bear weight LLE. RN staff aware and monitoring. Chart review indicates Severe OA L knee and bakers cyst.)             General ADL Comments: Pt unable to stand/bear weight LLE for ADL's or functional transfers. Chart reviewed, pt  w/ significant OA L knee w/ Bakers Cyst noted. Per MD note, to schedule Ortho consult. Pt will need 24/7 supervision/assist SNF Rehab for OT/transfers training and ADL's.. Pt is currently using bedpan & LB ADL's at bed level due to LLE pain.      Vision  Wears glasses at all times                   Perception     Praxis      Cognition   Behavior During Therapy: WFL for tasks assessed/performed Overall Cognitive  Status: Within Functional Limits for tasks assessed                       Extremity/Trunk Assessment               Exercises     Shoulder Instructions       General Comments      Pertinent Vitals/ Pain       Pain Assessment: 0-10 Pain Score: 7  Pain Location: LLE (ankle/foot/knee) Pain Descriptors / Indicators: Sore Pain Intervention(s): Monitored during session;Repositioned;Limited activity within patient's tolerance  Home Living                                          Prior Functioning/Environment              Frequency Min 2X/week     Progress Toward Goals  OT Goals(current goals can now be found in the care plan section)  Progress towards OT goals: Not progressing toward goals - comment (Pt requiring increased assistance and currently unable to bear weight LLE due to pain)  Acute Rehab OT Goals Patient Stated Goal: Return home after Rehab  Plan Discharge plan needs to be updated    Co-evaluation                 End of Session Equipment Utilized During Treatment: Gait belt;Rolling walker   Activity Tolerance Patient tolerated treatment well;Patient limited by pain   Patient Left in bed;with call bell/phone within reach;with bed alarm set   Nurse Communication Mobility status        Time: [id number] OT Time Calculation (min): 40 min  Charges: OT General Charges $OT Visit: 1 Procedure OT Treatments $Self Care/Home Management : 23-37 mins $Therapeutic Activity: 8-22 mins  Mazal Ebey Beth Dixon, OTR/L 03/13/2016, 9:38 AM

## 2016-03-13 NOTE — Discharge Instructions (Signed)

## 2016-03-13 NOTE — Consult Note (Signed)
ORTHOPAEDIC CONSULTATION  REQUESTING PHYSICIAN: Reyne Dumas, MD  Chief Complaint: Painful arthritis left knee with effusion  HPI: [name] is a 82 y.o. female who presents with chronic tricompartmental arthritis of her left knee. Patient has undergone arthroscopic treatment with Dr. Merry Proud bean. Patient presents at this time pain with weightbearing unable to ambulate for the past several days.  Past Medical History:  Diagnosis Date  . 1St degree AV block   . Acute renal failure ([place])    due to dehydration-Sept 2009; Normal creat 1.2 on fu 05/13/2009  . Allergic rhinitis   . Anemia    NOS chronic disease. Hgb 11.6gm% 04/14/2009  . Bradycardia   . CAD (coronary artery disease)    a. s/p DES mLAD 2005. b. Cutting balloon angioplasty for ISR 2006. c. LHC 08/2010 nonobstructive. d. Myoview 06/2012: small anteroapical infarct/mod inferolat wall infarct but no ischemia, EF 75%.  . Cancer ([place])    on nose and had it removed  . Chronic anticoagulation   . CKD (chronic kidney disease), stage III   . DEMENTIA    slight  . Diabetes mellitus type II   . Diverticulitis of colon   . DM neuropathy, type II diabetes mellitus ([place])   . GERD (gastroesophageal reflux disease)   . HTN (hypertension)   . Hx of colonoscopy   . Hyperlipidemia   . IBS (irritable bowel syndrome)   . Multinodular goiter   . Obesity    BMI-37  . Orthostatic hypotension   . Osteoarthritis    lower back  . Osteoporosis   . Pneumonia   . PVC's (premature ventricular contractions)    a. seen on telemetry 02/2016.  Marland Kitchen RBBB   . Rheumatoid arthritis ([place])   . Spinal stenosis    djd lumbar   Past Surgical History:  Procedure Laterality Date  . ABDOMINAL HYSTERECTOMY    . bilateral arthroscopic knee surgery    . carpel tunnel wrist rt    . CHOLECYSTECTOMY    . CORONARY ANGIOPLASTY     stent  . NASAL SINUS SURGERY     x2  . stent surgery     Social History   Social History  . Marital status:  Married    Spouse name: N/A  . Number of children: N/A  . Years of education: N/A   Occupational History  . retired    Social History Main Topics  . Smoking status: Never Smoker  . Smokeless tobacco: Never Used  . Alcohol use No  . Drug use: No  . Sexual activity: No   Other Topics Concern  . None   Social History Narrative  . None   Family History  Problem Relation Age of Onset  . Heart attack Father   . Breast cancer Sister   . Heart disease Brother   . Heart attack Son   . Heart attack Daughter   . Colon cancer Neg Hx   . Stroke Neg Hx    - negative except otherwise stated in the family history section Allergies  Allergen Reactions  . Amoxicillin Other (See Comments)    UNKNOWN   Prior to Admission medications   Medication Sig Start Date End Date Taking? Authorizing Provider  albuterol (PROVENTIL HFA;VENTOLIN HFA) 108 (90 Base) MCG/ACT inhaler Inhale 2 puffs into the lungs every 6 (six) hours as needed for wheezing or shortness of breath.  11/11/15 11/10/16 Yes Historical Provider, MD  alendronate (FOSAMAX) 70 MG tablet TAKE 1 TABLET BY  MOUTH EVERY 7 DAYS ON TUESDAYS WITH A FULL GLASS OF WATER & ON AN EMPTY STOMACH 02/22/16  Yes Fay Records, MD  amLODipine (NORVASC) 5 MG tablet Take 5 mg by mouth daily. 02/20/16  Yes Historical Provider, MD  apixaban (ELIQUIS) 5 MG TABS tablet Take 1 tablet (5 mg total) by mouth 2 (two) times daily. 01/31/15  Yes Scott Joylene Draft, PA-C  Calcium Carbonate-Vit D-Min (CALCIUM 1200 PO) Take 1 tablet by mouth daily.    Yes Historical Provider, MD  fluticasone (FLONASE) 50 MCG/ACT nasal spray Place 1 spray into both nostrils daily as needed for allergies.  01/17/16  Yes Historical Provider, MD  gabapentin (NEURONTIN) 600 MG tablet Take 600 mg by mouth 2 (two) times daily.  10/25/13  Yes Historical Provider, MD  glipiZIDE (GLUCOTROL XL) 5 MG 24 hr tablet Take 5 mg by mouth every evening.    Yes Historical Provider, MD  Multiple Vitamins-Minerals  (CENTRUM SILVER PO) Take 1 tablet by mouth daily.    Yes Historical Provider, MD  Multiple Vitamins-Minerals (PRESERVISION AREDS PO) Take 1 tablet by mouth 2 (two) times daily.   Yes Historical Provider, MD  nitroGLYCERIN (NITROSTAT) 0.4 MG SL tablet Place 0.4 mLs under the tongue every 5 (five) minutes as needed for chest pain (Max 3 doses).  08/16/14  Yes Historical Provider, MD  ondansetron (ZOFRAN) 8 MG tablet Take 1 tablet (8 mg total) by mouth every 8 (eight) hours as needed for nausea or vomiting. 12/30/15  Yes Daleen Bo, MD  oxybutynin (DITROPAN) 5 MG tablet Take 5 mg by mouth daily.   Yes Historical Provider, MD  pantoprazole (PROTONIX) 40 MG tablet Take 1 tablet (40 mg total) by mouth 2 (two) times daily. 10/25/15  Yes Rhonda G Barrett, PA-C  rosuvastatin (CRESTOR) 5 MG tablet Take 5 mg by mouth at bedtime.   Yes Historical Provider, MD  traMADol (ULTRAM) 50 MG tablet Take 100 mg by mouth daily as needed for moderate pain.  02/13/16  Yes Historical Provider, MD   Dg Ankle 2 Views Left  Result Date: 03/11/2016 CLINICAL DATA:  Left ankle pain, no known injury, initial encounter EXAM: LEFT ANKLE - 2 VIEW COMPARISON:  None. FINDINGS: Mild soft tissue swelling is noted about the ankle joint. No acute fracture or dislocation is seen. IMPRESSION: Soft tissue swelling without acute bony abnormality. Electronically Signed   By: Inez Catalina M.D.   On: 03/11/2016 17:51   Mr Knee Left  Wo Contrast  Result Date: 03/12/2016 CLINICAL DATA:  Worsening knee pain over the past several months. EXAM: MRI OF THE LEFT KNEE WITHOUT CONTRAST TECHNIQUE: Multiplanar, multisequence MR imaging of the knee was performed. No intravenous contrast was administered. COMPARISON:  None. FINDINGS: MENISCI Medial meniscus: Severely degenerated and torn. Large radial tear involving the posterior horn with detachment and significant medial protrusion of the meniscus. Lateral meniscus: Intrasubstance degenerative changes and free  edge tears. LIGAMENTS Cruciates: Chronic ACL deficient knee. Severe mucoid degeneration of the PCL. Collaterals:  Intact CARTILAGE Patellofemoral:  Moderate degenerative chondrosis/ chondromalacia. Medial: Severe degenerative chondrosis/chondromalacia with full-thickness cartilage loss, joint space narrowing and extensive spurring. Lateral: Moderate to advanced degenerative chondrosis with joint space narrowing and spurring. Joint: Large joint effusion and moderate synovitis. Loose ossified bodies are noted. Popliteal Fossa:  Moderate to large leaking Baker's cyst. Extensor Mechanism: The patella retinacular structures are intact and the quadriceps and patellar tendons are intact. Bones: No acute bony findings. Extensive degenerative changes and spurring. Other: None IMPRESSION: 1.  Severely degenerated and torn dysfunctional medial meniscus. 2. Degenerative changes and free edge tears involving the lateral meniscus. 3. Suspect chronic ACL deficient knee. Advanced mucoid degeneration of the PCL. 4. Tricompartmental degenerative changes most significant in the medial compartment. 5. Large joint effusion with moderate synovitis and small loose bodies. Electronically Signed   By: Marijo Sanes M.D.   On: 03/12/2016 14:18   Dg Knee Left Port  Result Date: 03/11/2016 CLINICAL DATA:  Left knee pain without trauma. EXAM: PORTABLE LEFT KNEE - 1-2 VIEW COMPARISON:  None. FINDINGS: Marked medial, moderate patellofemoral/lateral compartment joint space narrowing and subchondral sclerosis. Small suprapatellar joint effusion. No acute fracture or dislocation. Vascular calcifications. IMPRESSION: Three compartment osteoarthritis with small suprapatellar joint effusion. Electronically Signed   By: Abigail Miyamoto M.D.   On: 03/11/2016 13:16   Dg Foot 2 Views Left  Result Date: 03/11/2016 CLINICAL DATA:  Foot pain, no known injury, initial encounter EXAM: LEFT FOOT - 2 VIEW COMPARISON:  None. FINDINGS: There is no evidence of  fracture or dislocation. There is no evidence of arthropathy or other focal bone abnormality. Soft tissues are unremarkable. IMPRESSION: No acute abnormality noted. Electronically Signed   By: Inez Catalina M.D.   On: 03/11/2016 17:50   Dg Hip Unilat With Pelvis 1v Right  Result Date: 03/11/2016 CLINICAL DATA:  Pt c/o right hip pain and left leg pain from the knee down to the foot. No trauma history submitted. EXAM: DG HIP (WITH OR WITHOUT PELVIS) 1V RIGHT COMPARISON:  None. FINDINGS: AP view the pelvis and frog-leg view the right hip. Femoral heads are located. Degenerative disc disease at the lumbosacral junction is suboptimally evaluated. Patient is rotated on the pelvic radiograph. Joint spaces are maintained for age. No fracture identified. IMPRESSION: No acute osseous abnormality. Electronically Signed   By: Abigail Miyamoto M.D.   On: 03/11/2016 14:19   - pertinent xrays, CT, MRI studies were reviewed and independently interpreted  Positive ROS: All other systems have been reviewed and were otherwise negative with the exception of those mentioned in the HPI and as above.  Physical Exam: General: Alert, no acute distress Psychiatric: Patient is competent for consent with normal mood and affect Lymphatic: No axillary or cervical lymphadenopathy Cardiovascular: No pedal edema Respiratory: No cyanosis, no use of accessory musculature GI: No organomegaly, abdomen is soft and non-tender  Skin: Patient has an effusion of the left knee there is no cellulitis there is warmth due to the effusion. She is globally tender to palpation around the knee.   Neurologic: Patient does  have protective sensation bilateral lower extremities.   MUSCULOSKELETAL:  On examination MRI scan shows tricompartmental arthritis with meniscal tear medially as well as loose bodies. Patient has global tenderness to palpation around the left knee she does have an effusion.  Assessment: Assessment: Tricompartmental  osteoarthritis left knee with effusion.  Plan: Plan: After informed consent and sterile prepping patient underwent a local anesthesia with 1 mL of 1% lidocaine plain. The knee was then aspirated from the superior lateral portal with an 18-gauge needle. Patient had clear synovial fluid no hemarthrosis no signs of infection or gout. The knee was injected with 1 mL Depo-Medrol 40 mg and 5 mL 1% lidocaine plain she tolerated this well patient may ambulate as tolerated. I will follow-up as needed.  Thank you for the consult and the opportunity to see Ms. Everett Graff, MD [place] [phone] 11:09 AM

## 2016-03-13 NOTE — Clinical Social Work Note (Signed)
CSW facilitated patient discharge including contacting patient family and facility to confirm patient discharge plans. Clinical information faxed to facility and family agreeable with plan. CSW arranged ambulance transport via [facility] to [facility] around 1:30 pm. RN to call report prior to discharge [phone]).  CSW will sign off for now as social work intervention is no longer needed. Please consult [place] again if new needs arise.  Dayton Scrape, [place]

## 2016-03-13 NOTE — Progress Notes (Signed)
Rehab Admissions Coordinator Note:  Patient was screened by Retta Diones for appropriateness for an Inpatient Acute Rehab Consult.  At this time, we are recommending [place].  Given current diagnosis, it is very unlikely that we could get authorization for an acute inpatient rehab admission.  Retta Diones 03/13/2016, 10:46 AM  I can be reached at [phone].

## 2016-03-13 NOTE — Discharge Summary (Signed)
Physician Discharge Summary  [name] MRN: [id number] DOB/AGE: 1924/07/21 82 y.o.  PCP: Chesley Noon, MD   Admit date: 03/06/2016 Discharge date: 03/13/2016  Discharge Diagnoses:    Active Problems:   Bradycardia   GERD   Coronary artery disease   Atrial fibrillation, permanent   Diabetes mellitus type II, non insulin dependent (HCC)   HTN (hypertension)   Chest pain, negative MI, negative Nuc, may be GI   Depression   Chest pain at rest   PVC's (premature ventricular contractions)     Follow-up recommendations Follow-up with PCP in 3-5 days , including all  additional recommended appointments as below Follow-up CBC, CMP in 3-5 days  patient extremely sensitive to beta blockers and these should never be prescribed, Patient may need up to 2 L of oxygen with ambulation, that can be slowly weaned    Current Discharge Medication List    START taking these medications   Details  colchicine 0.6 MG tablet Take 1 tablet (0.6 mg total) by mouth 2 (two) times daily. Qty: 20 tablet, Refills: 0    predniSONE (DELTASONE) 50 MG tablet Take 1 tablet (50 mg total) by mouth once. Qty: 2 tablet, Refills: 0      CONTINUE these medications which have CHANGED   Details  traMADol (ULTRAM) 50 MG tablet Take 2 tablets (100 mg total) by mouth daily as needed for moderate pain. Qty: 30 tablet, Refills: 0      CONTINUE these medications which have NOT CHANGED   Details  albuterol (PROVENTIL HFA;VENTOLIN HFA) 108 (90 Base) MCG/ACT inhaler Inhale 2 puffs into the lungs every 6 (six) hours as needed for wheezing or shortness of breath.     alendronate (FOSAMAX) 70 MG tablet TAKE 1 TABLET BY MOUTH EVERY 7 DAYS ON TUESDAYS WITH A FULL GLASS OF WATER & ON AN EMPTY STOMACH Qty: 4 tablet, Refills: 6    amLODipine (NORVASC) 5 MG tablet Take 5 mg by mouth daily.    apixaban (ELIQUIS) 5 MG TABS tablet Take 1 tablet (5 mg total) by mouth 2 (two) times daily.    Calcium Carbonate-Vit  D-Min (CALCIUM 1200 PO) Take 1 tablet by mouth daily.     fluticasone (FLONASE) 50 MCG/ACT nasal spray Place 1 spray into both nostrils daily as needed for allergies.     gabapentin (NEURONTIN) 600 MG tablet Take 600 mg by mouth 2 (two) times daily.     glipiZIDE (GLUCOTROL XL) 5 MG 24 hr tablet Take 5 mg by mouth every evening.     !! Multiple Vitamins-Minerals (CENTRUM SILVER PO) Take 1 tablet by mouth daily.     !! Multiple Vitamins-Minerals (PRESERVISION AREDS PO) Take 1 tablet by mouth 2 (two) times daily.    nitroGLYCERIN (NITROSTAT) 0.4 MG SL tablet Place 0.4 mLs under the tongue every 5 (five) minutes as needed for chest pain (Max 3 doses).     ondansetron (ZOFRAN) 8 MG tablet Take 1 tablet (8 mg total) by mouth every 8 (eight) hours as needed for nausea or vomiting. Qty: 20 tablet, Refills: 0    oxybutynin (DITROPAN) 5 MG tablet Take 5 mg by mouth daily.    pantoprazole (PROTONIX) 40 MG tablet Take 1 tablet (40 mg total) by mouth 2 (two) times daily. Qty: 60 tablet, Refills: 11    rosuvastatin (CRESTOR) 5 MG tablet Take 5 mg by mouth at bedtime.     !! - Potential duplicate medications found. Please discuss with provider.  Discharge Condition: Stable  Discharge Instructions Get Medicines reviewed and adjusted: Please take all your medications with you for your next visit with your Primary MD  Please request your Primary MD to go over all hospital tests and procedure/radiological results at the follow up, please ask your Primary MD to get all Hospital records sent to his/her office.  If you experience worsening of your admission symptoms, develop shortness of breath, life threatening emergency, suicidal or homicidal thoughts you must seek medical attention immediately by calling 911 or calling your MD immediately if symptoms less severe.  You must read complete instructions/literature along with all the possible adverse reactions/side effects for all the Medicines  you take and that have been prescribed to you. Take any new Medicines after you have completely understood and accpet all the possible adverse reactions/side effects.   Do not drive when taking Pain medications.   Do not take more than prescribed Pain, Sleep and Anxiety Medications  Special Instructions: If you have smoked or chewed Tobacco in the last 2 yrs please stop smoking, stop any regular Alcohol and or any Recreational drug use.  Wear Seat belts while driving.  Please note  You were cared for by a hospitalist during your hospital stay. Once you are discharged, your primary care physician will handle any further medical issues. Please note that NO REFILLS for any discharge medications will be authorized once you are discharged, as it is imperative that you return to your primary care physician (or establish a relationship with a primary care physician if you do not have one) for your aftercare needs so that they can reassess your need for medications and monitor your lab values.  Discharge Instructions    Diet - low sodium heart healthy    Complete by:  As directed   Diet - low sodium heart healthy    Complete by:  As directed   Diet - low sodium heart healthy    Complete by:  As directed   Diet - low sodium heart healthy    Complete by:  As directed   Diet - low sodium heart healthy    Complete by:  As directed   Increase activity slowly    Complete by:  As directed   Increase activity slowly    Complete by:  As directed   Increase activity slowly    Complete by:  As directed   Increase activity slowly    Complete by:  As directed   Increase activity slowly    Complete by:  As directed       Allergies  Allergen Reactions  . Amoxicillin Other (See Comments)    UNKNOWN      Disposition: SNF   Consults:  Gen. cardiology EP     Significant Diagnostic Studies:  Dg Chest 2 View  Result Date: 03/10/2016 CLINICAL DATA:  Pt reports SOB x 4 days; h/o DM, HTN, and  PNA; non-smoker EXAM: CHEST  2 VIEW COMPARISON:  03/06/2016 FINDINGS: Lungs are hyperexpanded but clear. No pleural effusion or pneumothorax. Cardiac silhouette is borderline enlarged. No mediastinal or hilar masses or evidence of adenopathy. Bony thorax is demineralized but intact. IMPRESSION: No acute cardiopulmonary disease. Electronically Signed   By: Lajean Manes M.D.   On: 03/10/2016 08:15   Dg Chest 2 View  Result Date: 03/06/2016 CLINICAL DATA:  Left-sided chest pain over the last 1 day. EXAM: CHEST  2 VIEW COMPARISON:  Two-view chest x-ray 10/24/2015 FINDINGS: The heart is mildly enlarged. Atherosclerotic  calcifications are present at the the aortic arch. Chronic interstitial coarsening is similar the prior exam. Mild bibasilar airspace disease likely reflects atelectasis. There is no edema or effusion to suggest failure. No significant airspace consolidation is present. Degenerate changes of the thoracic spine are stable. The visualized soft tissues and bony thorax are otherwise unremarkable. IMPRESSION: 1. Borderline cardiomegaly without failure. 2. Atherosclerosis of the thoracic aorta. 3. Mild bibasilar airspace disease likely reflects atelectasis. Electronically Signed   By: San Morelle M.D.   On: 03/06/2016 18:14   Dg Ankle 2 Views Left  Result Date: 03/11/2016 CLINICAL DATA:  Left ankle pain, no known injury, initial encounter EXAM: LEFT ANKLE - 2 VIEW COMPARISON:  None. FINDINGS: Mild soft tissue swelling is noted about the ankle joint. No acute fracture or dislocation is seen. IMPRESSION: Soft tissue swelling without acute bony abnormality. Electronically Signed   By: Inez Catalina M.D.   On: 03/11/2016 17:51   Mr Knee Left  Wo Contrast  Result Date: 03/12/2016 CLINICAL DATA:  Worsening knee pain over the past several months. EXAM: MRI OF THE LEFT KNEE WITHOUT CONTRAST TECHNIQUE: Multiplanar, multisequence MR imaging of the knee was performed. No intravenous contrast was  administered. COMPARISON:  None. FINDINGS: MENISCI Medial meniscus: Severely degenerated and torn. Large radial tear involving the posterior horn with detachment and significant medial protrusion of the meniscus. Lateral meniscus: Intrasubstance degenerative changes and free edge tears. LIGAMENTS Cruciates: Chronic ACL deficient knee. Severe mucoid degeneration of the PCL. Collaterals:  Intact CARTILAGE Patellofemoral:  Moderate degenerative chondrosis/ chondromalacia. Medial: Severe degenerative chondrosis/chondromalacia with full-thickness cartilage loss, joint space narrowing and extensive spurring. Lateral: Moderate to advanced degenerative chondrosis with joint space narrowing and spurring. Joint: Large joint effusion and moderate synovitis. Loose ossified bodies are noted. Popliteal Fossa:  Moderate to large leaking Baker's cyst. Extensor Mechanism: The patella retinacular structures are intact and the quadriceps and patellar tendons are intact. Bones: No acute bony findings. Extensive degenerative changes and spurring. Other: None IMPRESSION: 1. Severely degenerated and torn dysfunctional medial meniscus. 2. Degenerative changes and free edge tears involving the lateral meniscus. 3. Suspect chronic ACL deficient knee. Advanced mucoid degeneration of the PCL. 4. Tricompartmental degenerative changes most significant in the medial compartment. 5. Large joint effusion with moderate synovitis and small loose bodies. Electronically Signed   By: Marijo Sanes M.D.   On: 03/12/2016 14:18   Nm Pulmonary Perf And Vent  Result Date: 03/10/2016 CLINICAL DATA:  Shortness of Breath EXAM: NUCLEAR MEDICINE VENTILATION - PERFUSION LUNG SCAN Views: Anterior, posterior, left lateral, right lateral, RPO, LPO, RAO, LAO -ventilation and perfusion RADIOPHARMACEUTICALS:  30.0 mCi Technetium-14mDTPA aerosol inhalation and 4.06 mCi Technetium-942mAA IV COMPARISON:  Chest radiograph March 10, 2016 FINDINGS: Ventilation:  Radiotracer uptake is homogeneous and symmetric bilaterally. No focal ventilation defects identified. Perfusion: Radiotracer uptake is homogeneous and symmetric bilaterally. No perfusion defects are evident. IMPRESSION: No ventilation or perfusion defects. This study is regarded as within normal limits. Electronically Signed   By: WiLowella GripII M.D.   On: 03/10/2016 08:20   Dg Knee Left Port  Result Date: 03/11/2016 CLINICAL DATA:  Left knee pain without trauma. EXAM: PORTABLE LEFT KNEE - 1-2 VIEW COMPARISON:  None. FINDINGS: Marked medial, moderate patellofemoral/lateral compartment joint space narrowing and subchondral sclerosis. Small suprapatellar joint effusion. No acute fracture or dislocation. Vascular calcifications. IMPRESSION: Three compartment osteoarthritis with small suprapatellar joint effusion. Electronically Signed   By: KyAbigail Miyamoto.D.   On: 03/11/2016 13:16  Dg Foot 2 Views Left  Result Date: 03/11/2016 CLINICAL DATA:  Foot pain, no known injury, initial encounter EXAM: LEFT FOOT - 2 VIEW COMPARISON:  None. FINDINGS: There is no evidence of fracture or dislocation. There is no evidence of arthropathy or other focal bone abnormality. Soft tissues are unremarkable. IMPRESSION: No acute abnormality noted. Electronically Signed   By: Inez Catalina M.D.   On: 03/11/2016 17:50   Dg Hip Unilat With Pelvis 1v Right  Result Date: 03/11/2016 CLINICAL DATA:  Pt c/o right hip pain and left leg pain from the knee down to the foot. No trauma history submitted. EXAM: DG HIP (WITH OR WITHOUT PELVIS) 1V RIGHT COMPARISON:  None. FINDINGS: AP view the pelvis and frog-leg view the right hip. Femoral heads are located. Degenerative disc disease at the lumbosacral junction is suboptimally evaluated. Patient is rotated on the pelvic radiograph. Joint spaces are maintained for age. No fracture identified. IMPRESSION: No acute osseous abnormality. Electronically Signed   By: Abigail Miyamoto M.D.   On:  03/11/2016 14:19   US Abdomen Limited Ruq  Result Date: 03/07/2016 CLINICAL DATA:  Abdominal pain for 1 day, status postcholecystectomy EXAM: US ABDOMEN LIMITED - RIGHT UPPER QUADRANT COMPARISON:  CT scan 12/30/2015 FINDINGS: Gallbladder: Surgically absent Common bile duct: Diameter: 5.4 mm in diameter within normal limits Liver: No focal lesion identified. Within normal limits in parenchymal echogenicity. No intrahepatic biliary ductal dilatation. IMPRESSION: 1. Surgical absent gallbladder.  Normal CBD.  No focal hepatic mass. Electronically Signed   By: Lahoma Crocker M.D.   On: 03/07/2016 15:29      2-D echo  LV EF: 60% -   65%  ------------------------------------------------------------------- Indications:      Chest pain 786.51.  ------------------------------------------------------------------- History:   PMH:  First Degree AV Block. Right Bundle Branch Block. Atrial fibrillation.  Coronary artery disease.  Risk factors: Hypertension. Diabetes mellitus. Dyslipidemia.  ------------------------------------------------------------------- Study Conclusions  - Left ventricle: The cavity size was normal. Wall thickness was   normal. Systolic function was normal. The estimated ejection   fraction was in the range of 60% to 65%. The study is not   technically sufficient to allow evaluation of LV diastolic   function. - Aortic valve: There was mild regurgitation. - Left atrium: The atrium was mildly dilated. - Atrial septum: There was a patent foramen ovale. - Pulmonary arteries: PA peak pressure: 35 mm Hg (S).   Filed Weights   03/11/16 0230 03/12/16 0444 03/13/16 0321  Weight: 79.5 kg (175 lb 4.8 oz) 78.3 kg (172 lb 11.2 oz) 74.4 kg (164 lb)     Microbiology: No results found for this or any previous visit (from the past 240 hour(s)).     Blood Culture    Component Value Date/Time   SDES URINE, RANDOM 09/21/2012 0525   SPECREQUEST ADDED 2229 09/21/2012 0525   CULT   09/21/2012 0525    Multiple bacterial morphotypes present, none predominant. Suggest appropriate recollection if clinically indicated.   REPTSTATUS 09/22/2012 FINAL 09/21/2012 0525      Labs: Results for orders placed or performed during the hospital encounter of 03/06/16 (from the past 48 hour(s))  Glucose, capillary     Status: Abnormal   Collection Time: 03/11/16  2:07 PM  Result Value Ref Range   Glucose-Capillary 193 (H) 65 - 99 mg/dL  Glucose, capillary     Status: Abnormal   Collection Time: 03/11/16  4:13 PM  Result Value Ref Range   Glucose-Capillary 218 (H) 65 -  99 mg/dL  Uric acid     Status: None   Collection Time: 03/11/16  4:23 PM  Result Value Ref Range   Uric Acid, Serum 5.1 2.3 - 6.6 mg/dL  Glucose, capillary     Status: Abnormal   Collection Time: 03/11/16  9:10 PM  Result Value Ref Range   Glucose-Capillary 190 (H) 65 - 99 mg/dL  Glucose, capillary     Status: Abnormal   Collection Time: 03/12/16  7:19 AM  Result Value Ref Range   Glucose-Capillary 135 (H) 65 - 99 mg/dL  Comprehensive metabolic panel     Status: Abnormal   Collection Time: 03/12/16  9:07 AM  Result Value Ref Range   Sodium 136 135 - 145 mmol/L   Potassium 4.6 3.5 - 5.1 mmol/L   Chloride 100 (L) 101 - 111 mmol/L   CO2 30 22 - 32 mmol/L   Glucose, Bld 231 (H) 65 - 99 mg/dL   BUN 19 6 - 20 mg/dL   Creatinine, Ser 1.11 (H) 0.44 - 1.00 mg/dL   Calcium 8.8 (L) 8.9 - 10.3 mg/dL   Total Protein 5.9 (L) 6.5 - 8.1 g/dL   Albumin 2.9 (L) 3.5 - 5.0 g/dL   AST 20 15 - 41 U/L   ALT 18 14 - 54 U/L   Alkaline Phosphatase 48 38 - 126 U/L   Total Bilirubin 0.9 0.3 - 1.2 mg/dL   GFR calc non Af Amer 42 (L) >60 mL/min   GFR calc Af Amer 49 (L) >60 mL/min    Comment: (NOTE) The eGFR has been calculated using the CKD EPI equation. This calculation has not been validated in all clinical situations. eGFR's persistently <60 mL/min signify possible Chronic Kidney Disease.    Anion gap 6 5 - 15   Glucose, capillary     Status: Abnormal   Collection Time: 03/12/16 11:28 AM  Result Value Ref Range   Glucose-Capillary 202 (H) 65 - 99 mg/dL   Comment 1 Notify RN    Comment 2 Document in Chart   Glucose, capillary     Status: Abnormal   Collection Time: 03/12/16  4:14 PM  Result Value Ref Range   Glucose-Capillary 169 (H) 65 - 99 mg/dL  Glucose, capillary     Status: Abnormal   Collection Time: 03/12/16  8:09 PM  Result Value Ref Range   Glucose-Capillary 166 (H) 65 - 99 mg/dL  CBC     Status: Abnormal   Collection Time: 03/13/16  4:06 AM  Result Value Ref Range   WBC 8.1 4.0 - 10.5 K/uL   RBC 3.83 (L) 3.87 - 5.11 MIL/uL   Hemoglobin 11.8 (L) 12.0 - 15.0 g/dL   HCT 36.9 36.0 - 46.0 %   MCV 96.3 78.0 - 100.0 fL   MCH 30.8 26.0 - 34.0 pg   MCHC 32.0 30.0 - 36.0 g/dL   RDW 12.7 11.5 - 15.5 %   Platelets 192 150 - 400 K/uL  Glucose, capillary     Status: Abnormal   Collection Time: 03/13/16  7:28 AM  Result Value Ref Range   Glucose-Capillary 108 (H) 65 - 99 mg/dL     Lipid Panel     Component Value Date/Time   CHOL 145 10/25/2015 0329   TRIG 84 10/25/2015 0329   HDL 52 10/25/2015 0329   CHOLHDL 2.8 10/25/2015 0329   VLDL 17 10/25/2015 0329   LDLCALC 76 10/25/2015 0329     Lab Results  Component Value Date  HGBA1C 6.0 (H) 10/24/2015   HGBA1C 6.0 06/07/2014   HGBA1C 5.8 (H) 02/05/2013        HPI :  ZAMIYA DILLARD is a 82 y.o. female with PMhx of CAD ((DES to mLAD 2005, cutting balloon angioplasty due to ISR in 2006), Permanent AFib, DM, HTN, HLD, GERD, IBS, spinal stenosis, anemia, multinodular goiter, known RBBB, CKD stage III, was brought to [facility] by EMS secondary to CP. After several hours of persistent discomfort, she called EMS. She received 4 baby ASA, as well as 2 SL NTG without significant relief. She says it was only until she went for her CXR did symptoms finally ease off spontaneously (6pm).    She was seen in consult by cardiology service,  dr. Harrington Challenger who did not suspect a cardiac etiology and has r/o with neg Trop x3.  She was apparently started on BB here for her HTN apparently by H&P note receiving a total of 2 dose of metoprolol 12.43m, last dose 0849 yesterday.  The patient last evening was on the bedside commode urinating when she had a syncopal event, she was placed back in bed an given brief compressions with ROSC, telemetry noted pause of 7.8 seconds  She was seen by cardiology NP, suspected micturation syncope/vagal event, her BB held and EP asked to evaluate.  The patient feels very well this morning, denies any ongoing c/o CP since her arrival, no palpitations, no SOB.  She reports nothing like last PM has ever happened to her before.  She felt lightheaded a second or so prior to fainting.  She has never had any near syncope or syncope historically.  HOSPITAL COURSE:     Chest pain  - Atypical in that in occurred at rest and not exacerbated by activity; Normal nuclear scan in April 2017. No further assessment recommended at this time. This is likely noncardiac - Initial EKG not significantly and initial troponin 0.01; CXR without acute disease  - Received 2 doses of SL NTG and ASA 324 mg en route Telemetry EKG, troponin negative, Right upper quadrant ultrasound to rule out common bile duct stone, liver function, lipase - Lopressor started for HTN; discontinued due to syncope related to vasovagal episode with a long pause, continue Crestor  Continue treatment for GERD, continue PPI 2-D echo results as above  code blue/7.8 second pause Associated with loss of consciousness,, likely vagal event, the patient states she was "holding it"  for a few minutes waiting to get to the commode, and once on the commode started to urinate and fainted.  Appeared to become brady prior to the pause and had profound bradycardia afterwards for approx 20seconds. Brief CPR was performed  Otherwise baseline rhythm is atrial fibrillation Beta  blocker was discontinued and the patient was ordered to receive one dose of atropine, but never given EP was consulted, and followed her during this hospitalization, as per cardiology no indication for pacemaker  Hypoxemia-likely secondary to atelectasis Chest x-ray negative, VQ scan negative, 2-D echo within normal limits Continue to wean oxygen   CAD - DES to mLAD in 2005 and subsequent angioplasty for in-stent restenosis  - LHC (February 2012) with LAD 40%, patent stent, prox &mid RCA 30%, and EF 60%  - Evaluating for ACS as above  - Continue Crestor , beta blocker, aspirin discontinued prior to discharge as the patient was not on aspirin and is currently on eliquis      Chronic atrial fibrillation  - In rate-controlled a fib on admission  -  CHADS-VASc is at least 50 (age x2, gender, CAD, HTN, DM) - Continue AC with Eliquis 5 mg BID     Type II DM  - A1c 6.0% in April 2017  - Managed at home with glipizide only, resume  Stable CBGs     Hypertension  - Not on any antihypertensives at time of admission  - There is diastolic HTN in ED   Did not tolerate beta blockers    Depression  - Stable, continue Lexapro   GERD  - Managed with BID Protonix at home, will continue    Left leg pain Patient complaining of inability to walk due to left knee pain, plain films of the left knee showed severe OA and the righthip were negative , venous Doppler to rule out DVT was negative  Subsequently developed pain in her left ankle, x-rays negative for fracture, uric acid 5.1, ankle pain has improved after being started on colchicine However still unable to bear weight on the left knee, MRI of the left knee shows  Large joint effusion with moderate synovitis and small loose, doubt hemarthrosis , discussed with orthopedics Dr. Sharol Given, he currently aspirated the knee, injected Depo-Medrol. Patient also started on a three-day course of prednisone. Fluid was clear with no signs of infection  or gout. Patient would need outpatient follow-up with orthopedics for possible knee replacement      Discharge Exam:   Blood pressure 139/80, pulse 82, temperature 98.1 F (36.7 C), temperature source Oral, resp. rate 18, height 5' 3"  (1.6 m), weight 74.4 kg (164 lb), SpO2 100 %.   Pt is alert and oriented, pleasant elderly woman in NAD HEENT: normal Neck: JVP - normal Lungs: CTA bilaterally CV: Irregularly irregular without murmur or gallop Abd: soft, NT, Positive BS, no hepatomegaly Ext: no C/C/E, distal pulses intact and equal Skin: warm/dry no rash   Follow-up Information    BADGER,MICHAEL C, MD. Schedule an appointment as soon as possible for a visit in 2 day(s).   Specialty:  Family Medicine Why:  Hospital follow-up Contact information: [place] [address] [phone]        Dorris Carnes, MD. Schedule an appointment as soon as possible for a visit in 3 day(s).   Specialty:  Cardiology Why:  Hospital follow-up Contact information: [address] [place] [address] [phone]           Signed: Reyne Dumas 03/13/2016, 11:18 AM        Time spent >45 mins

## 2016-03-13 NOTE — Progress Notes (Signed)
Report called to [facility] place. Prescription in the chart. Pt updated with plan. Waiting for P TAR.

## 2016-03-14 ENCOUNTER — Encounter: Payer: Self-pay | Admitting: Internal Medicine

## 2016-03-14 ENCOUNTER — Non-Acute Institutional Stay (SKILLED_NURSING_FACILITY): Payer: Medicare Other | Admitting: Internal Medicine

## 2016-03-14 DIAGNOSIS — I481 Persistent atrial fibrillation: Secondary | ICD-10-CM

## 2016-03-14 DIAGNOSIS — R0902 Hypoxemia: Secondary | ICD-10-CM | POA: Diagnosis not present

## 2016-03-14 DIAGNOSIS — I10 Essential (primary) hypertension: Secondary | ICD-10-CM

## 2016-03-14 DIAGNOSIS — R001 Bradycardia, unspecified: Secondary | ICD-10-CM | POA: Diagnosis not present

## 2016-03-14 DIAGNOSIS — M792 Neuralgia and neuritis, unspecified: Secondary | ICD-10-CM | POA: Diagnosis not present

## 2016-03-14 DIAGNOSIS — K219 Gastro-esophageal reflux disease without esophagitis: Secondary | ICD-10-CM

## 2016-03-14 DIAGNOSIS — I2583 Coronary atherosclerosis due to lipid rich plaque: Secondary | ICD-10-CM

## 2016-03-14 DIAGNOSIS — M25462 Effusion, left knee: Secondary | ICD-10-CM

## 2016-03-14 DIAGNOSIS — M109 Gout, unspecified: Secondary | ICD-10-CM | POA: Diagnosis not present

## 2016-03-14 DIAGNOSIS — I2510 Atherosclerotic heart disease of native coronary artery without angina pectoris: Secondary | ICD-10-CM | POA: Diagnosis not present

## 2016-03-14 DIAGNOSIS — N3281 Overactive bladder: Secondary | ICD-10-CM

## 2016-03-14 DIAGNOSIS — M179 Osteoarthritis of knee, unspecified: Secondary | ICD-10-CM | POA: Diagnosis not present

## 2016-03-14 DIAGNOSIS — M1712 Unilateral primary osteoarthritis, left knee: Secondary | ICD-10-CM

## 2016-03-14 DIAGNOSIS — E1122 Type 2 diabetes mellitus with diabetic chronic kidney disease: Secondary | ICD-10-CM | POA: Diagnosis not present

## 2016-03-14 DIAGNOSIS — R531 Weakness: Secondary | ICD-10-CM | POA: Diagnosis not present

## 2016-03-14 DIAGNOSIS — I4819 Other persistent atrial fibrillation: Secondary | ICD-10-CM

## 2016-03-14 DIAGNOSIS — E785 Hyperlipidemia, unspecified: Secondary | ICD-10-CM

## 2016-03-14 DIAGNOSIS — F32A Depression, unspecified: Secondary | ICD-10-CM

## 2016-03-14 DIAGNOSIS — M810 Age-related osteoporosis without current pathological fracture: Secondary | ICD-10-CM

## 2016-03-14 DIAGNOSIS — F329 Major depressive disorder, single episode, unspecified: Secondary | ICD-10-CM

## 2016-03-14 NOTE — Progress Notes (Signed)
LOCATION: [name]  PCP: Chesley Noon, MD   Code Status: Full Code  Goals of care: Advanced Directive information Advanced Directives 03/06/2016  Does patient have an advance directive? Yes  Type of Paramedic of [place];Living will  Does patient want to make changes to advanced directive? -  Copy of advanced directive(s) in chart? No - copy requested  Pre-existing out of facility DNR order (yellow form or pink MOST form) -       Extended Emergency Contact Information Primary Emergency Contact: [name] of [place] Phone: [phone] Relation: Son Secondary Emergency Contact: [name],[name]  [place] Phone: [phone] Relation: Grandaughter   Allergies  Allergen Reactions  . Amoxicillin Other (See Comments)    UNKNOWN    Chief Complaint  Patient presents with  . New Admit To SNF    New Admission     HPI:  Patient is a 82 y.o. female seen today for short term rehabilitation post hospital admission from 03/06/16-03/13/16 with chest pain. She was admitted to rule out acute CAD. She had a syncopal episode during urination and was noted to have a brief pause in telemetry. She required brief CPR. EP team was consulted and there was concern for b blocker induced bradycardia. b blocker was discontinued. Her echocardiogram was unrevealing. She had venous doppler of her legs that was negative for DVT. She has PMH of afib, CAD, dm type 2, HTN, depression, gerd. She is seen in her room today.   Review of Systems:  Constitutional: Negative for fever, chills, diaphoresis. Feels weak and tired.  HENT: Negative for headache, congestion, nasal discharge. Eyes: Negative for blurred vision, double vision and discharge. Wears glasses. Respiratory: Negative for cough, shortness of breath and wheezing.  on o2 by nasal canula. Complaints of her nose itching.  Cardiovascular: Negative for chest pain,  palpitations, leg swelling.  Gastrointestinal: Negative for heartburn, nausea, vomiting, abdominal pain. Last bowel movement was 2 days ago.  Genitourinary: Negative for dysuria and flank pain.  Musculoskeletal: Negative for back pain, fall in the facility.  Skin: Negative for rash.  Neurological: Negative for dizziness. Psychiatric/Behavioral: Negative for depression   Past Medical History:  Diagnosis Date  . 1St degree AV block   . Acute renal failure ([place])    due to dehydration-Sept 2009; Normal creat 1.2 on fu 05/13/2009  . Allergic rhinitis   . Anemia    NOS chronic disease. Hgb 11.6gm% 04/14/2009  . Bradycardia   . CAD (coronary artery disease)    a. s/p DES mLAD 2005. b. Cutting balloon angioplasty for ISR 2006. c. LHC 08/2010 nonobstructive. d. Myoview 06/2012: small anteroapical infarct/mod inferolat wall infarct but no ischemia, EF 75%.  . Cancer ([place])    on nose and had it removed  . Chronic anticoagulation   . CKD (chronic kidney disease), stage III   . DEMENTIA    slight  . Diabetes mellitus type II   . Diverticulitis of colon   . DM neuropathy, type II diabetes mellitus ([place])   . GERD (gastroesophageal reflux disease)   . HTN (hypertension)   . Hx of colonoscopy   . Hyperlipidemia   . IBS (irritable bowel syndrome)   . Multinodular goiter   . Obesity    BMI-37  . Orthostatic hypotension   . Osteoarthritis    lower back  . Osteoporosis   . Pneumonia   . PVC's (premature ventricular contractions)    a. seen on telemetry  02/2016.  Marland Kitchen RBBB   . Rheumatoid arthritis ([place])   . Spinal stenosis    djd lumbar   Past Surgical History:  Procedure Laterality Date  . ABDOMINAL HYSTERECTOMY    . bilateral arthroscopic knee surgery    . carpel tunnel wrist rt    . CHOLECYSTECTOMY    . CORONARY ANGIOPLASTY     stent  . NASAL SINUS SURGERY     x2  . stent surgery     Social History:   reports that she has never smoked. She has never used smokeless tobacco. She  reports that she does not drink alcohol or use drugs.  Family History  Problem Relation Age of Onset  . Heart attack Father   . Breast cancer Sister   . Heart disease Brother   . Heart attack Son   . Heart attack Daughter   . Colon cancer Neg Hx   . Stroke Neg Hx     Medications:   Medication List       Accurate as of 03/14/16 12:35 PM. Always use your most recent med list.          albuterol 108 (90 Base) MCG/ACT inhaler Commonly known as:  PROVENTIL HFA;VENTOLIN HFA Inhale 2 puffs into the lungs every 6 (six) hours as needed for wheezing or shortness of breath.   alendronate 70 MG tablet Commonly known as:  FOSAMAX TAKE 1 TABLET BY MOUTH EVERY 7 DAYS ON TUESDAYS WITH A FULL GLASS OF WATER & ON AN EMPTY STOMACH   amLODipine 5 MG tablet Commonly known as:  NORVASC Take 5 mg by mouth daily.   apixaban 5 MG Tabs tablet Commonly known as:  ELIQUIS Take 1 tablet (5 mg total) by mouth 2 (two) times daily.   atorvastatin 20 MG tablet Commonly known as:  LIPITOR Take 20 mg by mouth daily.   CALCIUM 1200 PO Take 2 tablets by mouth daily.   colchicine 0.6 MG tablet Take 1 tablet (0.6 mg total) by mouth 2 (two) times daily.   fluticasone 50 MCG/ACT nasal spray Commonly known as:  FLONASE Place 1 spray into both nostrils daily as needed for allergies.   gabapentin 600 MG tablet Commonly known as:  NEURONTIN Take 600 mg by mouth 2 (two) times daily.   glipiZIDE 5 MG 24 hr tablet Commonly known as:  GLUCOTROL XL Take 5 mg by mouth every evening.   NITROSTAT 0.4 MG SL tablet Generic drug:  nitroGLYCERIN Place 0.4 mLs under the tongue every 5 (five) minutes as needed for chest pain (Max 3 doses).   ondansetron 8 MG tablet Commonly known as:  ZOFRAN Take 1 tablet (8 mg total) by mouth every 8 (eight) hours as needed for nausea or vomiting.   oxybutynin 5 MG tablet Commonly known as:  DITROPAN Take 5 mg by mouth daily.   OXYGEN Inhale 2 L into the lungs  continuous.   pantoprazole 40 MG tablet Commonly known as:  PROTONIX Take 1 tablet (40 mg total) by mouth 2 (two) times daily.   predniSONE 50 MG tablet Commonly known as:  DELTASONE Take 50 mg by mouth daily with breakfast. Stop date 03/14/16   PRESERVISION AREDS PO Take 1 tablet by mouth 2 (two) times daily.   traMADol 50 MG tablet Commonly known as:  ULTRAM Take 2 tablets (100 mg total) by mouth daily as needed for moderate pain.       Immunizations: Immunization History  Administered Date(s) Administered  . DTP 11/20/2002  .  Influenza Whole 04/27/2009, 05/08/2010  . Pneumococcal Conjugate-13 07/22/2002  . Pneumococcal Polysaccharide-23 07/30/2007     Physical Exam:  Vitals:   03/14/16 1210  BP: (!) 132/54  Pulse: 72  Resp: 20  Temp: 98.8 F (37.1 C)  TempSrc: Oral  SpO2: 96%  Weight: 165 lb (74.8 kg)  Height: 5\' 5"  (1.651 m)   Body mass index is 27.46 kg/m.  General- elderly female, well built, in no acute distress Head- normocephalic, atraumatic Nose- no nasal discharge Throat- moist mucus membrane Eyes- PERRLA, EOMI, no pallor, no icterus, no discharge Neck- no cervical lymphadenopathy Cardiovascular- irregular heart rate, no murmur, trace leg edema Respiratory- bilateral clear to auscultation, no wheeze, no rhonchi, no crackles, no use of accessory muscles Abdomen- bowel sounds present, soft, non tender Musculoskeletal- able to move all 4 extremities, generalized weakness, arthritis changes to her fingers Neurological- alert and oriented to person, place and time Skin- warm and dry Psychiatry- normal mood and affect    Labs reviewed: Basic Metabolic Panel:  Recent Labs  10/24/15 1153  03/07/16 1116 03/09/16 0528 03/12/16 0907  NA  --   < > 139 137 136  K  --   < > 4.1 4.2 4.6  CL  --   < > 105 103 100*  CO2  --   < > 27 26 30   GLUCOSE  --   < > 136* 130* 231*  BUN  --   < > 14 27* 19  CREATININE  --   < > 1.05* 1.23* 1.11*  CALCIUM   --   < > 9.3 9.0 8.8*  MG 1.9  --   --   --   --   < > = values in this interval not displayed. Liver Function Tests:  Recent Labs  03/07/16 1116 03/09/16 0528 03/12/16 0907  AST 20 19 20   ALT 17 15 18   ALKPHOS 56 54 48  BILITOT 0.9 0.7 0.9  PROT 6.3* 6.0* 5.9*  ALBUMIN 3.6 3.5 2.9*    Recent Labs  12/30/15 1625 03/07/16 1116  LIPASE 109* 33   No results for input(s): AMMONIA in the last 8760 hours. CBC:  Recent Labs  03/06/16 1725 03/09/16 0528 03/13/16 0406  WBC 6.5 8.8 8.1  HGB 13.6 13.6 11.8*  HCT 41.5 41.3 36.9  MCV 95.0 95.6 96.3  PLT 226 204 192   Cardiac Enzymes:  Recent Labs  03/06/16 2000 03/07/16 0224 03/07/16 1116  TROPONINI <0.03 <0.03 <0.03   BNP: Invalid input(s): POCBNP CBG:  Recent Labs  03/12/16 2009 03/13/16 0728 03/13/16 1123  GLUCAP 166* 108* 207*    Radiological Exams: Dg Chest 2 View  Result Date: 03/10/2016 CLINICAL DATA:  Pt reports SOB x 4 days; h/o DM, HTN, and PNA; non-smoker EXAM: CHEST  2 VIEW COMPARISON:  03/06/2016 FINDINGS: Lungs are hyperexpanded but clear. No pleural effusion or pneumothorax. Cardiac silhouette is borderline enlarged. No mediastinal or hilar masses or evidence of adenopathy. Bony thorax is demineralized but intact. IMPRESSION: No acute cardiopulmonary disease. Electronically Signed   By: Lajean Manes M.D.   On: 03/10/2016 08:15   Dg Chest 2 View  Result Date: 03/06/2016 CLINICAL DATA:  Left-sided chest pain over the last 1 day. EXAM: CHEST  2 VIEW COMPARISON:  Two-view chest x-ray 10/24/2015 FINDINGS: The heart is mildly enlarged. Atherosclerotic calcifications are present at the the aortic arch. Chronic interstitial coarsening is similar the prior exam. Mild bibasilar airspace disease likely reflects atelectasis. There is no edema or  effusion to suggest failure. No significant airspace consolidation is present. Degenerate changes of the thoracic spine are stable. The visualized soft tissues and  bony thorax are otherwise unremarkable. IMPRESSION: 1. Borderline cardiomegaly without failure. 2. Atherosclerosis of the thoracic aorta. 3. Mild bibasilar airspace disease likely reflects atelectasis. Electronically Signed   By: San Morelle M.D.   On: 03/06/2016 18:14   Dg Ankle 2 Views Left  Result Date: 03/11/2016 CLINICAL DATA:  Left ankle pain, no known injury, initial encounter EXAM: LEFT ANKLE - 2 VIEW COMPARISON:  None. FINDINGS: Mild soft tissue swelling is noted about the ankle joint. No acute fracture or dislocation is seen. IMPRESSION: Soft tissue swelling without acute bony abnormality. Electronically Signed   By: Inez Catalina M.D.   On: 03/11/2016 17:51   Mr Knee Left  Wo Contrast  Result Date: 03/12/2016 CLINICAL DATA:  Worsening knee pain over the past several months. EXAM: MRI OF THE LEFT KNEE WITHOUT CONTRAST TECHNIQUE: Multiplanar, multisequence MR imaging of the knee was performed. No intravenous contrast was administered. COMPARISON:  None. FINDINGS: MENISCI Medial meniscus: Severely degenerated and torn. Large radial tear involving the posterior horn with detachment and significant medial protrusion of the meniscus. Lateral meniscus: Intrasubstance degenerative changes and free edge tears. LIGAMENTS Cruciates: Chronic ACL deficient knee. Severe mucoid degeneration of the PCL. Collaterals:  Intact CARTILAGE Patellofemoral:  Moderate degenerative chondrosis/ chondromalacia. Medial: Severe degenerative chondrosis/chondromalacia with full-thickness cartilage loss, joint space narrowing and extensive spurring. Lateral: Moderate to advanced degenerative chondrosis with joint space narrowing and spurring. Joint: Large joint effusion and moderate synovitis. Loose ossified bodies are noted. Popliteal Fossa:  Moderate to large leaking Baker's cyst. Extensor Mechanism: The patella retinacular structures are intact and the quadriceps and patellar tendons are intact. Bones: No acute bony  findings. Extensive degenerative changes and spurring. Other: None IMPRESSION: 1. Severely degenerated and torn dysfunctional medial meniscus. 2. Degenerative changes and free edge tears involving the lateral meniscus. 3. Suspect chronic ACL deficient knee. Advanced mucoid degeneration of the PCL. 4. Tricompartmental degenerative changes most significant in the medial compartment. 5. Large joint effusion with moderate synovitis and small loose bodies. Electronically Signed   By: Marijo Sanes M.D.   On: 03/12/2016 14:18   Nm Pulmonary Perf And Vent  Result Date: 03/10/2016 CLINICAL DATA:  Shortness of Breath EXAM: NUCLEAR MEDICINE VENTILATION - PERFUSION LUNG SCAN Views: Anterior, posterior, left lateral, right lateral, RPO, LPO, RAO, LAO -ventilation and perfusion RADIOPHARMACEUTICALS:  30.0 mCi Technetium-41m DTPA aerosol inhalation and 4.06 mCi Technetium-76m MAA IV COMPARISON:  Chest radiograph March 10, 2016 FINDINGS: Ventilation: Radiotracer uptake is homogeneous and symmetric bilaterally. No focal ventilation defects identified. Perfusion: Radiotracer uptake is homogeneous and symmetric bilaterally. No perfusion defects are evident. IMPRESSION: No ventilation or perfusion defects. This study is regarded as within normal limits. Electronically Signed   By: Lowella Grip III M.D.   On: 03/10/2016 08:20   Dg Knee Left Port  Result Date: 03/11/2016 CLINICAL DATA:  Left knee pain without trauma. EXAM: PORTABLE LEFT KNEE - 1-2 VIEW COMPARISON:  None. FINDINGS: Marked medial, moderate patellofemoral/lateral compartment joint space narrowing and subchondral sclerosis. Small suprapatellar joint effusion. No acute fracture or dislocation. Vascular calcifications. IMPRESSION: Three compartment osteoarthritis with small suprapatellar joint effusion. Electronically Signed   By: Abigail Miyamoto M.D.   On: 03/11/2016 13:16   Dg Foot 2 Views Left  Result Date: 03/11/2016 CLINICAL DATA:  Foot pain, no known  injury, initial encounter EXAM: LEFT FOOT - 2 VIEW COMPARISON:  None. FINDINGS: There is no evidence of fracture or dislocation. There is no evidence of arthropathy or other focal bone abnormality. Soft tissues are unremarkable. IMPRESSION: No acute abnormality noted. Electronically Signed   By: Inez Catalina M.D.   On: 03/11/2016 17:50   Dg Hip Unilat With Pelvis 1v Right  Result Date: 03/11/2016 CLINICAL DATA:  Pt c/o right hip pain and left leg pain from the knee down to the foot. No trauma history submitted. EXAM: DG HIP (WITH OR WITHOUT PELVIS) 1V RIGHT COMPARISON:  None. FINDINGS: AP view the pelvis and frog-leg view the right hip. Femoral heads are located. Degenerative disc disease at the lumbosacral junction is suboptimally evaluated. Patient is rotated on the pelvic radiograph. Joint spaces are maintained for age. No fracture identified. IMPRESSION: No acute osseous abnormality. Electronically Signed   By: Abigail Miyamoto M.D.   On: 03/11/2016 14:19   US Abdomen Limited Ruq  Result Date: 03/07/2016 CLINICAL DATA:  Abdominal pain for 1 day, status postcholecystectomy EXAM: US ABDOMEN LIMITED - RIGHT UPPER QUADRANT COMPARISON:  CT scan 12/30/2015 FINDINGS: Gallbladder: Surgically absent Common bile duct: Diameter: 5.4 mm in diameter within normal limits Liver: No focal lesion identified. Within normal limits in parenchymal echogenicity. No intrahepatic biliary ductal dilatation. IMPRESSION: 1. Surgical absent gallbladder.  Normal CBD.  No focal hepatic mass. Electronically Signed   By: Lahoma Crocker M.D.   On: 03/07/2016 15:29    Assessment/Plan  Generalized weakness Will have her work with physical therapy and occupational therapy team to help with gait training and muscle strengthening exercises.fall precautions. Skin care. Encourage to be out of bed.   CAD Chest pain free at present. Continue prn NTG with statin  Bradycardia Off coreg, monitor clinically, asymptomatic  afib Rate  controlled. Continue eliquis for stroke prophylaxis   Left knee effusion S/p joint fluid aspiration and depo-medrol injection. Continue and complete her course of prednisone today.   Left knee OA dvt and fracture has been ruled out. Has severe OA to left knee. Pending outpatient orthopedic follow up for possible knee replacement surgery. Continue tramadol 100 mg daily as needed and monitor. Will have patient work with PT/OT as tolerated to regain strength and restore function.  Fall precautions are in place.  Gout Continue colchicine 0.6 mg bid and monitor.   Neuropathic pain Continue gabapentin 600 mg bid and monitor  Hypoxemia Maintaining good o2 saturation. Wean off o2 to room air and monitor. Encouraged to use incentive spirometer  Type 2 DM with renal impairment Continue glipizide 5 mg daily and monitor cbg. Check a1c and bmp Lab Results  Component Value Date   HGBA1C 6.0 (H) 10/24/2015    HTN Monitor bp, continue norvasc 5 mg daily, check bmp  Depression Monitor her mood. Currently off meds  GERD Stable, continue protonix 40 mg bid  OAB Continue ditropan 5 mg daily  Hyperlipidemia Continue lipitor  Osteoporosis Fall precautions, continue weekly fosamax and daily ca-vit d supplement    Goals of care: short term rehabilitation   Labs/tests ordered: cbc, cmp, a1c 03/19/16   Family/ staff Communication: reviewed care plan with patient and nursing supervisor    Blanchie Serve, MD Internal Medicine [place], [place] [address] Cell Phone (Monday-Friday 8 am - 5 pm): [phone] On Call: [phone] and follow prompts after 5 pm and on weekends Office Phone: [phone] Office Fax: [phone]

## 2016-03-15 ENCOUNTER — Encounter: Payer: Self-pay | Admitting: Internal Medicine

## 2016-03-15 ENCOUNTER — Ambulatory Visit (INDEPENDENT_AMBULATORY_CARE_PROVIDER_SITE_OTHER): Payer: Medicare Other | Admitting: Internal Medicine

## 2016-03-15 ENCOUNTER — Encounter (INDEPENDENT_AMBULATORY_CARE_PROVIDER_SITE_OTHER): Payer: Self-pay

## 2016-03-15 VITALS — BP 128/64 | HR 65 | Ht 64.0 in | Wt 172.8 lb

## 2016-03-15 DIAGNOSIS — I10 Essential (primary) hypertension: Secondary | ICD-10-CM

## 2016-03-15 DIAGNOSIS — E785 Hyperlipidemia, unspecified: Secondary | ICD-10-CM

## 2016-03-15 DIAGNOSIS — R072 Precordial pain: Secondary | ICD-10-CM | POA: Diagnosis not present

## 2016-03-15 DIAGNOSIS — I4819 Other persistent atrial fibrillation: Secondary | ICD-10-CM

## 2016-03-15 DIAGNOSIS — I481 Persistent atrial fibrillation: Secondary | ICD-10-CM | POA: Diagnosis not present

## 2016-03-15 NOTE — Progress Notes (Signed)
Cardiology Office Note   Date:  03/15/2016   ID:  [name], DOB [birth year], MRN [id number]  PCP:  Chesley Noon, MD  Cardiologist:   Dorris Carnes, MD   F/U of CAD      History of Present Illness: [name] is a 82 y.o. female with a history of CAD, s/p prior Taxus DES to mLAD in 2005 and subsequent cutting balloon angioplasty due to ISR in 2006. Last LHC in 2/12 demonstrated LAD 40%, patent stent, prox and mid RCA 30% and EF 60%. Echo in 5/13: mild LVH, EF 55%, mild BAE, mild AI. She also has a h/o AFib, DM2, HTN, HLP, anemia, GERD, IBS, DJD and multinodular goiter. She is on Eliquis for anticoagulation.  In 2017 Leane Call was done which showed no ischemia  Holter monitor in July showed afib with controlled rates  She was just admitted with CP  R/O for MI  Metoprolol was started and she developed a long pause one night while on toilet  Felt due to meds and hyper vagal   Echo showed normal LVEF    The patient deneis CP  Breathing is OK  Now in rehab  Had not done much  Yet  Has signif problems with L knee  Had fluid taken out  Seen [facility]        Outpatient Medications Prior to Visit  Medication Sig Dispense Refill  . albuterol (PROVENTIL HFA;VENTOLIN HFA) 108 (90 Base) MCG/ACT inhaler Inhale 2 puffs into the lungs every 6 (six) hours as needed for wheezing or shortness of breath.     Marland Kitchen alendronate (FOSAMAX) 70 MG tablet TAKE 1 TABLET BY MOUTH EVERY 7 DAYS ON TUESDAYS WITH A FULL GLASS OF WATER & ON AN EMPTY STOMACH 4 tablet 6  . amLODipine (NORVASC) 5 MG tablet Take 5 mg by mouth daily.    Marland Kitchen apixaban (ELIQUIS) 5 MG TABS tablet Take 1 tablet (5 mg total) by mouth 2 (two) times daily.    . Calcium Carbonate-Vit D-Min (CALCIUM 1200 PO) Take 2 tablets by mouth daily.     . fluticasone (FLONASE) 50 MCG/ACT nasal spray Place 1 spray into both nostrils daily as needed for allergies.     Marland Kitchen gabapentin (NEURONTIN) 600 MG tablet Take 600 mg by  mouth 2 (two) times daily.     Marland Kitchen glipiZIDE (GLUCOTROL XL) 5 MG 24 hr tablet Take 5 mg by mouth every evening.     . Multiple Vitamins-Minerals (PRESERVISION AREDS PO) Take 1 tablet by mouth 2 (two) times daily.    . nitroGLYCERIN (NITROSTAT) 0.4 MG SL tablet Place 0.4 mLs under the tongue every 5 (five) minutes as needed for chest pain (Max 3 doses).     . ondansetron (ZOFRAN) 8 MG tablet Take 1 tablet (8 mg total) by mouth every 8 (eight) hours as needed for nausea or vomiting. 20 tablet 0  . oxybutynin (DITROPAN) 5 MG tablet Take 5 mg by mouth daily.    . OXYGEN Inhale 2 L into the lungs continuous.    . pantoprazole (PROTONIX) 40 MG tablet Take 1 tablet (40 mg total) by mouth 2 (two) times daily. 60 tablet 11  . traMADol (ULTRAM) 50 MG tablet Take 2 tablets (100 mg total) by mouth daily as needed for moderate pain. 30 tablet 0  . atorvastatin (LIPITOR) 20 MG tablet Take 20 mg by mouth daily.    . colchicine 0.6 MG tablet Take 1 tablet (0.6 mg total) by  mouth 2 (two) times daily. 20 tablet 0  . predniSONE (DELTASONE) 50 MG tablet Take 50 mg by mouth daily with breakfast. Stop date 03/14/16     No facility-administered medications prior to visit.      Allergies:   Amoxicillin   Past Medical History:  Diagnosis Date  . 1St degree AV block   . Acute renal failure ([place])    due to dehydration-Sept 2009; Normal creat 1.2 on fu 05/13/2009  . Allergic rhinitis   . Anemia    NOS chronic disease. Hgb 11.6gm% 04/14/2009  . Bradycardia   . CAD (coronary artery disease)    a. s/p DES mLAD 2005. b. Cutting balloon angioplasty for ISR 2006. c. LHC 08/2010 nonobstructive. d. Myoview 06/2012: small anteroapical infarct/mod inferolat wall infarct but no ischemia, EF 75%.  . Cancer ([place])    on nose and had it removed  . Chronic anticoagulation   . CKD (chronic kidney disease), stage III   . DEMENTIA    slight  . Diabetes mellitus type II   . Diverticulitis of colon   . DM neuropathy, type II  diabetes mellitus ([place])   . GERD (gastroesophageal reflux disease)   . HTN (hypertension)   . Hx of colonoscopy   . Hyperlipidemia   . IBS (irritable bowel syndrome)   . Multinodular goiter   . Obesity    BMI-37  . Orthostatic hypotension   . Osteoarthritis    lower back  . Osteoporosis   . Pneumonia   . PVC's (premature ventricular contractions)    a. seen on telemetry 02/2016.  Marland Kitchen RBBB   . Rheumatoid arthritis ([place])   . Spinal stenosis    djd lumbar    Past Surgical History:  Procedure Laterality Date  . ABDOMINAL HYSTERECTOMY    . bilateral arthroscopic knee surgery    . carpel tunnel wrist rt    . CHOLECYSTECTOMY    . CORONARY ANGIOPLASTY     stent  . NASAL SINUS SURGERY     x2  . stent surgery       Social History:  The patient  reports that she has never smoked. She has never used smokeless tobacco. She reports that she does not drink alcohol or use drugs.   Family History:  The patient's family history includes Breast cancer in her sister; Heart attack in her daughter, father, and son; Heart disease in her brother.    ROS:  Please see the history of present illness. All other systems are reviewed and  Negative to the above problem except as noted.    PHYSICAL EXAM: VS:  BP 128/64   Pulse 65   Ht 5\' 4"  (1.626 m)   Wt 172 lb 12.8 oz (78.4 kg)   SpO2 98%   BMI 29.66 kg/m   GEN: Well nourished, well developed, in no acute distress  HEENT: normal  Neck: no JVD, carotid bruits, or masses Cardiac: Irreg irreg  ; no murmurs, rubs, or gallops,Tr edema  Respiratory:  clear to auscultation bilaterally, normal work of breathing GI: soft, nontender, nondistended, + BS  No hepatomegaly  MS: no deformity Moving all extremities   Skin: warm and dry, no rash Neuro:  Strength and sensation are intact Psych: euthymic mood, full affect   EKG:  EKG is not ordered today.   Lipid Panel    Component Value Date/Time   CHOL 145 10/25/2015 0329   TRIG 84 10/25/2015  0329   HDL 52 10/25/2015 0329  CHOLHDL 2.8 10/25/2015 0329   VLDL 17 10/25/2015 0329   LDLCALC 76 10/25/2015 0329      Wt Readings from Last 3 Encounters:  03/15/16 172 lb 12.8 oz (78.4 kg)  03/14/16 165 lb (74.8 kg)  03/13/16 164 lb (74.4 kg)      ASSESSMENT AND PLAN: 1  CAD  No symptoms to sugg angina  I did not think her recent CP leading to admit was cardiac  Follow  2.  [place]ant afib  Rates contrlled  She is extremely sensitive to node blocking agents  Had pause in hosp  I would not resume  3         Signed, Dorris Carnes, MD  03/15/2016 9:17 AM    [facility] [place], [place], [place]  [address] Phone: [phone]; Fax: [phone]

## 2016-03-15 NOTE — Patient Instructions (Addendum)
Your physician recommends that you continue on your current medications as directed. Please refer to the Current Medication list given to you today. **AVOID DRUGS HAT SLOW THE HEART (METOPROLOL, DILTIAZEM)** Your physician wants you to follow-up in: in January, 2018 with Dr. Harrington Challenger.  You will receive a reminder letter in the mail two months in advance. If you don't receive a letter, please call our office to schedule the follow-up appointment.

## 2016-03-19 LAB — CBC AND DIFFERENTIAL
HEMATOCRIT: 41 % (ref 36–46)
HEMOGLOBIN: 13.5 g/dL (ref 12.0–16.0)
PLATELETS: 305 10*3/uL (ref 150–399)
WBC: 6.4 10^3/mL

## 2016-03-19 LAB — HEPATIC FUNCTION PANEL
ALK PHOS: 72 U/L (ref 25–125)
ALT: 41 U/L — AB (ref 7–35)
AST: 21 U/L (ref 13–35)
BILIRUBIN, TOTAL: 0.5 mg/dL

## 2016-03-19 LAB — BASIC METABOLIC PANEL
BUN: 14 mg/dL (ref 4–21)
Creatinine: 0.9 mg/dL (ref 0.5–1.1)
GLUCOSE: 148 mg/dL
Potassium: 4.7 mmol/L (ref 3.4–5.3)
SODIUM: 143 mmol/L (ref 137–147)

## 2016-03-19 LAB — HEMOGLOBIN A1C: Hemoglobin A1C: 6.3

## 2016-03-20 ENCOUNTER — Non-Acute Institutional Stay (SKILLED_NURSING_FACILITY): Payer: Medicare Other | Admitting: Family

## 2016-03-20 DIAGNOSIS — E46 Unspecified protein-calorie malnutrition: Secondary | ICD-10-CM

## 2016-03-20 DIAGNOSIS — G4700 Insomnia, unspecified: Secondary | ICD-10-CM | POA: Diagnosis not present

## 2016-03-20 DIAGNOSIS — E119 Type 2 diabetes mellitus without complications: Secondary | ICD-10-CM | POA: Diagnosis not present

## 2016-03-20 MED ORDER — MELATONIN 3 MG PO TABS: 3.0000 mg | ORAL_TABLET | Freq: Every day | ORAL | 0 refills | Status: DC

## 2016-03-20 NOTE — Progress Notes (Signed)
Location:   [facility] Room Number: 105  Place of Service:  SNF (31) Provider:  Dinah Ngetich FNP-C   Chesley Noon, MD  Patient Care Team: Chesley Noon, MD as PCP - General (Family Medicine)  Extended Emergency Contact Information Primary Emergency Contact: [place] of [place] Phone: [phone] Relation: Son Secondary Emergency Contact: [place] of [place] Phone: [phone] Relation: Grandaughter  Code Status:  Full Code  Goals of care: Advanced Directive information Advanced Directives 03/06/2016  Does patient have an advance directive? Yes  Type of Paramedic of [place];Living will  Does patient want to make changes to advanced directive? -  Copy of advanced directive(s) in chart? No - copy requested  Pre-existing out of facility DNR order (yellow form or pink MOST form) -     Chief Complaint  Patient presents with  . Acute Visit    Abnormal Lab results     HPI:  Pt is a 82 y.o. female seen today at [facility] for an acute visit for abnormal lab results. She is seen in her room today. She complains of inability to sleep at night. She states working well with therapy. Her recent lab results showed TP 5.9, Alb 3.22, Hgb A1C 6.3 ( 03/19/2016). Her CBG's ranging in the 100's -upper 200's. Facility staff reports no new concerns.    Past Medical History:  Diagnosis Date  . 1St degree AV block   . Acute renal failure ([place])    due to dehydration-Sept 2009; Normal creat 1.2 on fu 05/13/2009  . Allergic rhinitis   . Anemia    NOS chronic disease. Hgb 11.6gm% 04/14/2009  . Bradycardia   . CAD (coronary artery disease)    a. s/p DES mLAD 2005. b. Cutting balloon angioplasty for ISR 2006. c. LHC 08/2010 nonobstructive. d. Myoview 06/2012: small anteroapical infarct/mod inferolat wall infarct but no ischemia, EF 75%.  . Cancer ([place])    on nose  and had it removed  . Chronic anticoagulation   . CKD (chronic kidney disease), stage III   . DEMENTIA    slight  . Diabetes mellitus type II   . Diverticulitis of colon   . DM neuropathy, type II diabetes mellitus ([place])   . GERD (gastroesophageal reflux disease)   . HTN (hypertension)   . Hx of colonoscopy   . Hyperlipidemia   . IBS (irritable bowel syndrome)   . Multinodular goiter   . Obesity    BMI-37  . Orthostatic hypotension   . Osteoarthritis    lower back  . Osteoporosis   . Pneumonia   . PVC's (premature ventricular contractions)    a. seen on telemetry 02/2016.  Marland Kitchen RBBB   . Rheumatoid arthritis ([place])   . Spinal stenosis    djd lumbar   Past Surgical History:  Procedure Laterality Date  . ABDOMINAL HYSTERECTOMY    . bilateral arthroscopic knee surgery    . carpel tunnel wrist rt    . CHOLECYSTECTOMY    . CORONARY ANGIOPLASTY     stent  . NASAL SINUS SURGERY     x2  . stent surgery      Allergies  Allergen Reactions  . Amoxicillin Other (See Comments)    UNKNOWN      Medication List       Accurate as of 03/20/16  1:33 PM. Always use your most recent med list.  albuterol 108 (90 Base) MCG/ACT inhaler Commonly known as:  PROVENTIL HFA;VENTOLIN HFA Inhale 2 puffs into the lungs every 6 (six) hours as needed for wheezing or shortness of breath.   alendronate 70 MG tablet Commonly known as:  FOSAMAX TAKE 1 TABLET BY MOUTH EVERY 7 DAYS ON TUESDAYS WITH A FULL GLASS OF WATER & ON AN EMPTY STOMACH   amLODipine 5 MG tablet Commonly known as:  NORVASC Take 5 mg by mouth daily.   apixaban 5 MG Tabs tablet Commonly known as:  ELIQUIS Take 1 tablet (5 mg total) by mouth 2 (two) times daily.   atorvastatin 20 MG tablet Commonly known as:  LIPITOR Take 20 mg by mouth daily.   CALCIUM 1200 PO Take 2 tablets by mouth daily.   colchicine 0.6 MG tablet Take 0.6 mg by mouth daily.   fluticasone 50 MCG/ACT nasal spray Commonly known as:   FLONASE Place 1 spray into both nostrils daily as needed for allergies.   gabapentin 600 MG tablet Commonly known as:  NEURONTIN Take 600 mg by mouth 2 (two) times daily.   glipiZIDE 5 MG 24 hr tablet Commonly known as:  GLUCOTROL XL Take 5 mg by mouth every evening.   NITROSTAT 0.4 MG SL tablet Generic drug:  nitroGLYCERIN Place 0.4 mLs under the tongue every 5 (five) minutes as needed for chest pain (Max 3 doses).   ondansetron 8 MG tablet Commonly known as:  ZOFRAN Take 1 tablet (8 mg total) by mouth every 8 (eight) hours as needed for nausea or vomiting.   oxybutynin 5 MG tablet Commonly known as:  DITROPAN Take 5 mg by mouth daily.   OXYGEN Inhale 2 L into the lungs continuous.   pantoprazole 40 MG tablet Commonly known as:  PROTONIX Take 1 tablet (40 mg total) by mouth 2 (two) times daily.   PRESERVISION AREDS PO Take 1 tablet by mouth 2 (two) times daily.   rosuvastatin 10 MG tablet Commonly known as:  CRESTOR Take 1 tablet by mouth daily.   traMADol 50 MG tablet Commonly known as:  ULTRAM Take 2 tablets (100 mg total) by mouth daily as needed for moderate pain.       Review of Systems  Constitutional: Negative for activity change, appetite change, chills and fever.  HENT: Negative for congestion, rhinorrhea, sinus pressure, sneezing and sore throat.   Eyes: Negative.   Respiratory: Negative for cough, chest tightness, shortness of breath and wheezing.        Off oxygen   Cardiovascular: Negative for chest pain, palpitations and leg swelling.  Gastrointestinal: Negative for abdominal distention, abdominal pain, constipation, diarrhea, nausea and vomiting.  Genitourinary: Negative for difficulty urinating, dysuria, flank pain, frequency and urgency.  Musculoskeletal: Positive for gait problem.  Skin: Negative for color change, pallor and rash.  Psychiatric/Behavioral: Positive for sleep disturbance. Negative for agitation and confusion. The patient is not  nervous/anxious.     Immunization History  Administered Date(s) Administered  . DTP 11/20/2002  . Influenza Whole 04/27/2009, 05/08/2010  . Pneumococcal Conjugate-13 07/22/2002  . Pneumococcal Polysaccharide-23 07/30/2007   Pertinent  Health Maintenance Due  Topic Date Due  . FOOT EXAM  03/03/1934  . OPHTHALMOLOGY EXAM  03/03/1934  . URINE MICROALBUMIN  12/08/2013  . INFLUENZA VACCINE  02/21/2016  . HEMOGLOBIN A1C  04/24/2016  . DEXA SCAN  Completed  . PNA vac Low Risk Adult  Completed   No flowsheet data found. Functional Status Survey:    Vitals:  03/20/16 1000  BP: 137/77  Pulse: 89  Resp: 20  Temp: 98 F (36.7 C)  SpO2: 98%  Weight: 169 lb (76.7 kg)  Height: 5\' 4"  (1.626 m)   Body mass index is 29.01 kg/m. Physical Exam  Constitutional: She is oriented to person, place, and time. She appears well-developed and well-nourished. No distress.  HENT:  Head: Normocephalic.  Mouth/Throat: Oropharynx is clear and moist. No oropharyngeal exudate.  Eyes: Conjunctivae and EOM are normal. Pupils are equal, round, and reactive to light. Right eye exhibits no discharge. Left eye exhibits no discharge. No scleral icterus.  Neck: Normal range of motion. No JVD present. No thyromegaly present.  Cardiovascular: Normal rate, regular rhythm, normal heart sounds and intact distal pulses.  Exam reveals no gallop and no friction rub.   No murmur heard. Pulmonary/Chest: Effort normal and breath sounds normal. No respiratory distress. She has no wheezes. She has no rales.  Abdominal: Soft. Bowel sounds are normal. She exhibits no distension. There is no tenderness. There is no rebound and no guarding.  Genitourinary:  Genitourinary Comments: Continent   Musculoskeletal: Normal range of motion. She exhibits no edema, tenderness or deformity.  Lymphadenopathy:    She has no cervical adenopathy.  Neurological: She is oriented to person, place, and time.  Skin: Skin is warm and dry. No  rash noted. No erythema. No pallor.  Psychiatric: She has a normal mood and affect.    Labs reviewed:  Recent Labs  10/24/15 1153  03/07/16 1116 03/09/16 0528 03/12/16 0907  NA  --   < > 139 137 136  K  --   < > 4.1 4.2 4.6  CL  --   < > 105 103 100*  CO2  --   < > 27 26 30   GLUCOSE  --   < > 136* 130* 231*  BUN  --   < > 14 27* 19  CREATININE  --   < > 1.05* 1.23* 1.11*  CALCIUM  --   < > 9.3 9.0 8.8*  MG 1.9  --   --   --   --   < > = values in this interval not displayed.  Recent Labs  03/07/16 1116 03/09/16 0528 03/12/16 0907  AST 20 19 20   ALT 17 15 18   ALKPHOS 56 54 48  BILITOT 0.9 0.7 0.9  PROT 6.3* 6.0* 5.9*  ALBUMIN 3.6 3.5 2.9*    Recent Labs  03/06/16 1725 03/09/16 0528 03/13/16 0406  WBC 6.5 8.8 8.1  HGB 13.6 13.6 11.8*  HCT 41.5 41.3 36.9  MCV 95.0 95.6 96.3  PLT 226 204 192   Lab Results  Component Value Date   TSH 1.499 03/06/2016   Lab Results  Component Value Date   HGBA1C 6.0 (H) 10/24/2015   Lab Results  Component Value Date   CHOL 145 10/25/2015   HDL 52 10/25/2015   LDLCALC 76 10/25/2015   TRIG 84 10/25/2015   CHOLHDL 2.8 10/25/2015    Assessment/Plan 1. Protein-calorie malnutrition (HCC) TP 5.9, Alb 3.22 ( 03/19/2016). Reports good appetite. Consult RD for supplements.   2. Insomnia Add Melatonin 3 mg Tablet at bedtime.   3. Diabetes mellitus type II, non insulin dependent (HCC)  Hgb A1C 6.3 ( 03/19/2016). CBG's ranging in the 100's -upper 200's.Will continue on Glipizide 5 mg Tablet. Will consider dose reduction or discontinue if CBG's less than 200. Continue to monitor.     Family/ staff Communication: Reviewed plan of care  with patient and facility Nurse supervisor.  Labs/tests ordered:  None

## 2016-03-29 ENCOUNTER — Other Ambulatory Visit: Payer: Self-pay

## 2016-03-29 ENCOUNTER — Ambulatory Visit: Payer: Medicare Other | Admitting: Physician Assistant

## 2016-03-29 NOTE — Progress Notes (Signed)
Cardiology Office Note:    Date:  03/30/2016   ID:  [name], DOB 10-30-1923, MRN [id number]  PCP:  Chesley Noon, MD  Cardiologist:  Dr. Dorris Carnes   Electrophysiologist:  n/a  Referring MD: Chesley Noon, MD   Chief Complaint  Patient presents with  . Chest Pain  ?? - Mistake  History of Present Illness:    [name] is a 82 y.o. female with a hx of CAD, s/p prior Taxus DES to mLAD in 2005 and subsequent cutting balloon angioplasty due to ISR in 2006. LHC in 2/12 demonstrated LAD 40%, patent stent prox and mid RCA 30% and EF 60%. Echo in 5/13: mild LVH, EF 55%, mild BAE, mild AI. She also has a h/o AFib, DM2, HTN, HLP, anemia, GERD, IBS, DJD and multinodular goiter.  She is on Eliquis for anticoagulation.  Myoview in 4/17 was low risk and negative for skin anemia or scar.  Admitted in 8/17 with chest pain.  Troponins were neg x 3.  The patient did develop syncope with documented 7.8 second pause while urinating in the bedside commode. She had bradycardia for some time and she did receive brief CPR.  Beta blocker was stopped. She was seen by EP. It was felt that her syncope was vagal mediated. There was no indication for pacemaker. She required knee aspiration secondary to synovitis. D-dimer was elevated. VQ scan was normal. Patient saw Dr. Harrington Challenger in follow-up 03/15/16.  She returns for evaluation of chest pain.  She is staying at [facility] since [place] from the hospital.  She is due to go home tomorrow.  She notes intermittent substernal chest pain that is brief.  It is not like her prior angina.  She has noted some symptoms after meals.  No exertional symptoms. She notes minimal dyspnea.  She denies syncope, orthopnea, PND or significant pedal edema.  She denies any bleeding issues.  Denies dysphagia, pleuritic chest pain.    Prior CV studies that were reviewed today include:    LE Venous Duplex No evidence of deep vein thrombosis involving the visualized veins of  the right lower extremity and left lower extremity.   Echo 03/07/16 EF 60-65%, mild AI, mild LAE, + PFO, PASP 35 mmHg   Nuc Myocardial Perfusion Study 4/17 IMPRESSION: 1. No reversible ischemia or infarction. 2. Normal left ventricular wall motion. 3. Left ventricular ejection fraction 80%. 4. Low-risk stress test findings*.   Holter 7/16 Atrial fibrillation.  Average HR 78 bpm  Longest pause 2.5 seconds  Myoview 12/18/14 Normal stress nuclear study.  LV Ejection Fraction: 71%.  Carotid US 05/25/14 Bilat ICA 1-39% >> FU 2 years  Echo 11/21/11 Mild LVH, EF 55%, no RWMA Mild AI Mild LAE Trivial MR Mild RAE PASP 29-33 mmHg   Past Medical History:  Diagnosis Date  . 1St degree AV block   . Acute renal failure ([place])    due to dehydration-Sept 2009; Normal creat 1.2 on fu 05/13/2009  . Allergic rhinitis   . Anemia    NOS chronic disease. Hgb 11.6gm% 04/14/2009  . Bradycardia   . CAD (coronary artery disease)    a. s/p DES mLAD 2005. b. Cutting balloon angioplasty for ISR 2006. c. LHC 08/2010 nonobstructive. d. Myoview 06/2012: small anteroapical infarct/mod inferolat wall infarct but no ischemia, EF 75%.  . Cancer ([place])    on nose and had it removed  . Chronic anticoagulation   . CKD (chronic kidney disease), stage III   .  DEMENTIA    slight  . Diabetes mellitus type II   . Diverticulitis of colon   . DM neuropathy, type II diabetes mellitus ([place])   . GERD (gastroesophageal reflux disease)   . HTN (hypertension)   . Hx of colonoscopy   . Hyperlipidemia   . IBS (irritable bowel syndrome)   . Multinodular goiter   . Obesity    BMI-37  . Orthostatic hypotension   . Osteoarthritis    lower back  . Osteoporosis   . Pneumonia   . PVC's (premature ventricular contractions)    a. seen on telemetry 02/2016.  Marland Kitchen RBBB   . Rheumatoid arthritis ([place])   . Spinal stenosis    djd lumbar    Past Surgical History:  Procedure Laterality Date  . ABDOMINAL HYSTERECTOMY    .  bilateral arthroscopic knee surgery    . carpel tunnel wrist rt    . CHOLECYSTECTOMY    . CORONARY ANGIOPLASTY     stent  . NASAL SINUS SURGERY     x2  . stent surgery      Current Medications: Current Meds  Medication Sig  . albuterol (PROVENTIL HFA;VENTOLIN HFA) 108 (90 Base) MCG/ACT inhaler Inhale 2 puffs into the lungs every 6 (six) hours as needed for wheezing or shortness of breath.   Marland Kitchen alendronate (FOSAMAX) 70 MG tablet TAKE 1 TABLET BY MOUTH EVERY 7 DAYS ON TUESDAYS WITH A FULL GLASS OF WATER & ON AN EMPTY STOMACH  . amLODipine (NORVASC) 5 MG tablet Take 1 tablet by mouth at bedtime.  Marland Kitchen apixaban (ELIQUIS) 5 MG TABS tablet Take 1 tablet (5 mg total) by mouth 2 (two) times daily.  Marland Kitchen atorvastatin (LIPITOR) 20 MG tablet Take 20 mg by mouth daily.  . Calcium Carbonate-Vitamin D (CALCIUM 500/D PO) Take 1,000 mg by mouth daily.   . colchicine 0.6 MG tablet Take 0.6 mg by mouth daily.  . fluticasone (FLONASE) 50 MCG/ACT nasal spray Place 1 spray into both nostrils daily as needed for allergies.   Marland Kitchen gabapentin (NEURONTIN) 600 MG tablet Take 600 mg by mouth 2 (two) times daily.   Marland Kitchen glipiZIDE (GLUCOTROL XL) 5 MG 24 hr tablet Take 5 mg by mouth every evening.   . Melatonin 3 MG TABS Take 1 tablet (3 mg total) by mouth at bedtime.  . Multiple Vitamins-Minerals (PRESERVISION AREDS PO) Take 1 tablet by mouth 2 (two) times daily.  . nitroGLYCERIN (NITROSTAT) 0.4 MG SL tablet Place 0.4 mLs under the tongue every 5 (five) minutes as needed for chest pain (Max 3 doses).   . ondansetron (ZOFRAN) 8 MG tablet Take 1 tablet (8 mg total) by mouth every 8 (eight) hours as needed for nausea or vomiting.  Marland Kitchen oxybutynin (DITROPAN) 5 MG tablet Take 5 mg by mouth daily.  . pantoprazole (PROTONIX) 40 MG tablet Take 1 tablet (40 mg total) by mouth 2 (two) times daily. Take 30 minutes before meals.  [name] Sodium (SENNA S PO) Take by mouth. Take 2 tablets at bedtime for constipation  . traMADol  (ULTRAM) 50 MG tablet Take 2 tablets (100 mg total) by mouth daily as needed for moderate pain.  . [DISCONTINUED] pantoprazole (PROTONIX) 40 MG tablet Take 1 tablet (40 mg total) by mouth 2 (two) times daily.  . [DISCONTINUED] Specialty Vitamins Products (PROSTATE PO) Take by mouth. Take 30 ml daily for 30 days       Allergies:   Amoxicillin   Social History   Social History  .  Marital status: Married    Spouse name: N/A  . Number of children: N/A  . Years of education: N/A   Occupational History  . retired    Social History Main Topics  . Smoking status: Never Smoker  . Smokeless tobacco: Never Used  . Alcohol use No  . Drug use: No  . Sexual activity: No   Other Topics Concern  . None   Social History Narrative  . None     Family History:  The patient's family history includes Breast cancer in her sister; Heart attack in her daughter, father, and son; Heart disease in her brother.   ROS:   Please see the history of present illness.    Review of Systems  Constitution: Positive for weight gain.  HENT: Positive for hearing loss.   Cardiovascular: Positive for irregular heartbeat and leg swelling.  Musculoskeletal: Positive for joint pain, joint swelling and myalgias.  Neurological: Positive for loss of balance.   All other systems reviewed and are negative.   EKGs/Labs/Other Test Reviewed:    EKG:  EKG is  ordered today.  The ekg ordered today demonstrates Atrial fibrillation, HR 80, RBBB, no change from prior tracing  Recent Labs: 10/24/2015: Magnesium 1.9 03/06/2016: B Natriuretic Peptide 194.4; TSH 1.499 03/19/2016: ALT 41; BUN 14; Creatinine 0.9; Hemoglobin 13.5; Platelets 305; Potassium 4.7; Sodium 143   Recent Lipid Panel    Component Value Date/Time   CHOL 145 10/25/2015 0329   TRIG 84 10/25/2015 0329   HDL 52 10/25/2015 0329   CHOLHDL 2.8 10/25/2015 0329   VLDL 17 10/25/2015 0329   LDLCALC 76 10/25/2015 0329     Physical Exam:    VS:  Pulse 80    Ht 5\' 3"  (1.6 m)   Wt 177 lb 6.4 oz (80.5 kg)   BMI 31.42 kg/m     Wt Readings from Last 3 Encounters:  03/30/16 177 lb 6.4 oz (80.5 kg)  03/30/16 180 lb (81.6 kg)  03/20/16 169 lb (76.7 kg)     Physical Exam  Constitutional: She is oriented to person, place, and time. She appears well-developed and well-nourished. No distress.  HENT:  Head: Normocephalic and atraumatic.  Eyes: No scleral icterus.  Neck: Normal range of motion. No JVD present.  Cardiovascular: Normal rate, S1 normal and S2 normal.  An irregularly irregular rhythm present. Exam reveals no gallop and no friction rub.   No murmur heard. Pulmonary/Chest: Breath sounds normal. She has no wheezes. She has no rhonchi. She has no rales.  Abdominal: Soft. There is no tenderness.  Musculoskeletal: She exhibits edema.  Trace bilateral ankle edema  Neurological: She is alert and oriented to person, place, and time.  Skin: Skin is warm and dry.  Psychiatric: She has a normal mood and affect.    ASSESSMENT:    1. Precordial pain   2. Coronary artery disease involving native coronary artery of native heart without angina pectoris   3. Chronic atrial fibrillation ([place])   4. Essential hypertension   5. Hyperlipidemia    PLAN:    In order of problems listed above:  1. Chest pain - Her symptoms are somewhat atypical. They are not like prior angina. Her ECG is unchanged. She had a normal stress test in April of this year. She had a recent echocardiogram with stable findings. She has symptoms of acid reflux. She has also been working with physical therapy. I suspect her pain is related to GERD +/- musculoskeletal pain. I have recommended  that she make sure she takes Protonix 30 minutes before her meals BID and add Pepcid 20 mg to her medical regimen. She can take this daily at bedtime 1 week. Reassurance provided. If she continues to have symptoms or her symptoms worsen, she knows to follow-up sooner.  2. CAD - She is status  post prior PCI with DES to the LAD in 2005. Recent nuclear stress test with no ischemia in 4/17. Current chest symptoms seem to be musculoskeletal or related to reflux. She is not on aspirin as she is on Eliquis. Continue amlodipine, statin.  3. Chronic atrial fibrillation - Rate is controlled. Continue Eliquis.  4. HTN - Blood pressure is running somewhat low. She has been symptomatic with this. She is used to taking her amlodipine at bedtime. I have asked her to go ahead and change this back to daily at bedtime. If she continues to run low blood pressures, she will call. I would suggest changing to 2.5 mg daily at that point.  5. HL - Continue statin.   Medication Adjustments/Labs and Tests Ordered: Current medicines are reviewed at length with the patient today.  Concerns regarding medicines are outlined above.  Medication changes, Labs and Tests ordered today are outlined in the Patient Instructions noted below. Patient Instructions  Medication Instructions:  Take protonix 30 minutes before meals twice a day Pepcid 20 mg at bedtime for one week than discontinue Change Norvasc 5 mg  (amolodipine) to bedtime  Labwork: None ordered  Testing/Procedures: None ordered  Follow-Up: Follow up with Richardson Dopp in 3 months December 17 at 10:45 am.   Any Other Special Instructions Will Be Listed Below (If Applicable).  If you need a refill on your cardiac medications before your next appointment, please call your pharmacy.  Signed, Richardson Dopp, PA-C  03/30/2016 1:06 PM    [facility] [address], [place], [place]  [address] Phone: [phone]; Fax: [phone]

## 2016-03-30 ENCOUNTER — Non-Acute Institutional Stay (SKILLED_NURSING_FACILITY): Payer: Medicare Other | Admitting: Family

## 2016-03-30 ENCOUNTER — Telehealth: Payer: Self-pay | Admitting: *Deleted

## 2016-03-30 ENCOUNTER — Encounter: Payer: Self-pay | Admitting: Physician Assistant

## 2016-03-30 ENCOUNTER — Ambulatory Visit (INDEPENDENT_AMBULATORY_CARE_PROVIDER_SITE_OTHER): Payer: Medicare Other | Admitting: Physician Assistant

## 2016-03-30 ENCOUNTER — Encounter: Payer: Self-pay | Admitting: *Deleted

## 2016-03-30 ENCOUNTER — Encounter: Payer: Self-pay | Admitting: Family

## 2016-03-30 VITALS — HR 80 | Ht 63.0 in | Wt 177.4 lb

## 2016-03-30 DIAGNOSIS — I2510 Atherosclerotic heart disease of native coronary artery without angina pectoris: Secondary | ICD-10-CM | POA: Diagnosis not present

## 2016-03-30 DIAGNOSIS — E119 Type 2 diabetes mellitus without complications: Secondary | ICD-10-CM | POA: Diagnosis not present

## 2016-03-30 DIAGNOSIS — F329 Major depressive disorder, single episode, unspecified: Secondary | ICD-10-CM

## 2016-03-30 DIAGNOSIS — I10 Essential (primary) hypertension: Secondary | ICD-10-CM

## 2016-03-30 DIAGNOSIS — G629 Polyneuropathy, unspecified: Secondary | ICD-10-CM

## 2016-03-30 DIAGNOSIS — E782 Mixed hyperlipidemia: Secondary | ICD-10-CM | POA: Diagnosis not present

## 2016-03-30 DIAGNOSIS — J449 Chronic obstructive pulmonary disease, unspecified: Secondary | ICD-10-CM | POA: Diagnosis not present

## 2016-03-30 DIAGNOSIS — R072 Precordial pain: Secondary | ICD-10-CM | POA: Diagnosis not present

## 2016-03-30 DIAGNOSIS — K219 Gastro-esophageal reflux disease without esophagitis: Secondary | ICD-10-CM

## 2016-03-30 DIAGNOSIS — I482 Chronic atrial fibrillation, unspecified: Secondary | ICD-10-CM

## 2016-03-30 DIAGNOSIS — F32A Depression, unspecified: Secondary | ICD-10-CM

## 2016-03-30 DIAGNOSIS — E785 Hyperlipidemia, unspecified: Secondary | ICD-10-CM

## 2016-03-30 MED ORDER — PANTOPRAZOLE SODIUM 40 MG PO TBEC: 40.0000 mg | DELAYED_RELEASE_TABLET | Freq: Two times a day (BID) | ORAL | 11 refills | Status: DC

## 2016-03-30 MED ORDER — FAMOTIDINE 20 MG PO TABS: 20.0000 mg | ORAL_TABLET | Freq: Every day | ORAL | Status: DC

## 2016-03-30 NOTE — Progress Notes (Signed)
Location:   Organ Room Number: N105 Place of Service:  SNF (31)  Provider: Siyon Linck Mgetich, FNP-C   PCP: Chesley Noon, MD Patient Care Team: Chesley Noon, MD as PCP - General (Family Medicine)  Extended Emergency Contact Information Primary Emergency Contact: [place] of [place] Phone: [phone] Relation: Son Secondary Emergency Contact: [place] of [place] Phone: [phone] Relation: Grandaughter  Code Status: Full Code Goals of care:  Advanced Directive information Advanced Directives 03/30/2016  Does patient have an advance directive? No  Type of Advance Directive -  Does patient want to make changes to advanced directive? -  Copy of advanced directive(s) in chart? -  Pre-existing out of facility DNR order (yellow form or pink MOST form) -     Allergies  Allergen Reactions  . Amoxicillin Other (See Comments)    UNKNOWN    Chief Complaint  Patient presents with  . Discharge Note    discharge home    HPI: 82 y.o. female seen today at [facility] for discharge home.  She was here for short term rehabilitation post hospital admission from 03/06/16-03/13/16 with chest pain. She was admitted to rule out acute CAD. She had a syncopal episode during urination and was noted to have a brief pause in telemetry. She required brief CPR. EP team was consulted and there was concern for b blocker induced bradycardia. b blocker was discontinued. Her echocardiogram was unrevealing. She had venous doppler of her legs that was negative for DVT.She has a medical history of HTN, Type 2 DM, CAD, Afib among other conditions. She is seen in her room today. She denies any acute issues this visit. She has has worked well with PT/OT now stable for discharge home.She will be discharged home with Home health PT/OT to continue with ROM, Exercise, Gait stability and muscle strengthening.  She does not require any DME.Home health services will be arranged by facility social worker prior to discharge.she will discharge home with medication from the facility. Prescription medication will be written x 1 month then patient to follow up with PCP in 1-2 weeks. Facility staff report no new concerns.      Past Medical History:  Diagnosis Date  . 1St degree AV block   . Acute renal failure ([place])    due to dehydration-Sept 2009; Normal creat 1.2 on fu 05/13/2009  . Allergic rhinitis   . Anemia    NOS chronic disease. Hgb 11.6gm% 04/14/2009  . Bradycardia   . CAD (coronary artery disease)    a. s/p DES mLAD 2005. b. Cutting balloon angioplasty for ISR 2006. c. LHC 08/2010 nonobstructive. d. Myoview 06/2012: small anteroapical infarct/mod inferolat wall infarct but no ischemia, EF 75%.  . Cancer ([place])    on nose and had it removed  . Chronic anticoagulation   . CKD (chronic kidney disease), stage III   . DEMENTIA    slight  . Diabetes mellitus type II   . Diverticulitis of colon   . DM neuropathy, type II diabetes mellitus ([place])   . GERD (gastroesophageal reflux disease)   . HTN (hypertension)   . Hx of colonoscopy   . Hyperlipidemia   . IBS (irritable bowel syndrome)   . Multinodular goiter   . Obesity    BMI-37  . Orthostatic hypotension   . Osteoarthritis    lower back  . Osteoporosis   . Pneumonia   . PVC's (premature  ventricular contractions)    a. seen on telemetry 02/2016.  Marland Kitchen RBBB   . Rheumatoid arthritis ([place])   . Spinal stenosis    djd lumbar    Past Surgical History:  Procedure Laterality Date  . ABDOMINAL HYSTERECTOMY    . bilateral arthroscopic knee surgery    . carpel tunnel wrist rt    . CHOLECYSTECTOMY    . CORONARY ANGIOPLASTY     stent  . NASAL SINUS SURGERY     x2  . stent surgery        reports that she has never smoked. She has never used smokeless tobacco. She reports that she does not drink alcohol or use drugs. Social History    Social History  . Marital status: Married    Spouse name: N/A  . Number of children: N/A  . Years of education: N/A   Occupational History  . retired    Social History Main Topics  . Smoking status: Never Smoker  . Smokeless tobacco: Never Used  . Alcohol use No  . Drug use: No  . Sexual activity: No   Other Topics Concern  . Not on file   Social History Narrative  . No narrative on file    Allergies  Allergen Reactions  . Amoxicillin Other (See Comments)    UNKNOWN    Pertinent  Health Maintenance Due  Topic Date Due  . FOOT EXAM  03/03/1934  . OPHTHALMOLOGY EXAM  03/03/1934  . URINE MICROALBUMIN  12/08/2013  . INFLUENZA VACCINE  02/21/2016  . HEMOGLOBIN A1C  04/24/2016  . DEXA SCAN  Completed  . PNA vac Low Risk Adult  Completed    Medications:   Medication List       Accurate as of 03/30/16 10:11 AM. Always use your most recent med list.          albuterol 108 (90 Base) MCG/ACT inhaler Commonly known as:  PROVENTIL HFA;VENTOLIN HFA Inhale 2 puffs into the lungs every 6 (six) hours as needed for wheezing or shortness of breath.   alendronate 70 MG tablet Commonly known as:  FOSAMAX TAKE 1 TABLET BY MOUTH EVERY 7 DAYS ON TUESDAYS WITH A FULL GLASS OF WATER & ON AN EMPTY STOMACH   apixaban 5 MG Tabs tablet Commonly known as:  ELIQUIS Take 1 tablet (5 mg total) by mouth 2 (two) times daily.   atorvastatin 20 MG tablet Commonly known as:  LIPITOR Take 20 mg by mouth daily.   CALCIUM 500/D PO Take by mouth. Take 2 tablets daily   colchicine 0.6 MG tablet Take 0.6 mg by mouth daily.   fluticasone 50 MCG/ACT nasal spray Commonly known as:  FLONASE Place 1 spray into both nostrils daily as needed for allergies.   gabapentin 600 MG tablet Commonly known as:  NEURONTIN Take 600 mg by mouth 2 (two) times daily.   glipiZIDE 5 MG 24 hr tablet Commonly known as:  GLUCOTROL XL Take 5 mg by mouth every evening.   Melatonin 3 MG Tabs Take 1  tablet (3 mg total) by mouth at bedtime.   NITROSTAT 0.4 MG SL tablet Generic drug:  nitroGLYCERIN Place 0.4 mLs under the tongue every 5 (five) minutes as needed for chest pain (Max 3 doses).   ondansetron 8 MG tablet Commonly known as:  ZOFRAN Take 1 tablet (8 mg total) by mouth every 8 (eight) hours as needed for nausea or vomiting.   oxybutynin 5 MG tablet Commonly known as:  DITROPAN Take  5 mg by mouth daily.   OXYGEN Inhale 2 L into the lungs continuous.   pantoprazole 40 MG tablet Commonly known as:  PROTONIX Take 1 tablet (40 mg total) by mouth 2 (two) times daily.   PRESERVISION AREDS PO Take 1 tablet by mouth 2 (two) times daily.   PROSTATE PO Take by mouth. Take 30 ml daily for 30 days   SENNA S PO Take by mouth. Take 2 tablets at bedtime for constipation   traMADol 50 MG tablet Commonly known as:  ULTRAM Take 2 tablets (100 mg total) by mouth daily as needed for moderate pain.       Review of Systems  Constitutional: Negative for activity change, appetite change, chills and fever.  HENT: Negative for congestion, rhinorrhea, sinus pressure, sneezing and sore throat.   Eyes: Negative.   Respiratory: Negative for cough, chest tightness, shortness of breath and wheezing.        Off oxygen   Cardiovascular: Negative for chest pain, palpitations and leg swelling.  Gastrointestinal: Negative for abdominal distention, abdominal pain, constipation, diarrhea, nausea and vomiting.  Endocrine: Negative for cold intolerance, heat intolerance, polydipsia, polyphagia and polyuria.  Genitourinary: Negative for difficulty urinating, dysuria, flank pain, frequency and urgency.  Musculoskeletal: Positive for gait problem.  Skin: Negative for color change, pallor and rash.  Neurological: Negative for dizziness, seizures, syncope, light-headedness and headaches.  Hematological: Does not bruise/bleed easily.  Psychiatric/Behavioral: Negative for agitation, confusion and sleep  disturbance. The patient is not nervous/anxious.     Vitals:   03/30/16 0945  BP: 140/67  Pulse: 70  Temp: 97.4 F (36.3 C)  SpO2: 95%  Weight: 180 lb (81.6 kg)  Height: 5\' 4"  (1.626 m)   Body mass index is 30.9 kg/m. Physical Exam  Constitutional: She is oriented to person, place, and time. She appears well-developed and well-nourished. No distress.  HENT:  Head: Normocephalic.  Mouth/Throat: Oropharynx is clear and moist. No oropharyngeal exudate.  Eyes: Conjunctivae and EOM are normal. Pupils are equal, round, and reactive to light. Right eye exhibits no discharge. Left eye exhibits no discharge. No scleral icterus.  Neck: Normal range of motion. No JVD present. No thyromegaly present.  Cardiovascular: Normal rate, regular rhythm, normal heart sounds and intact distal pulses.  Exam reveals no gallop and no friction rub.   No murmur heard. Pulmonary/Chest: Effort normal and breath sounds normal. No respiratory distress. She has no wheezes. She has no rales.  Abdominal: Soft. Bowel sounds are normal. She exhibits no distension. There is no tenderness. There is no rebound and no guarding.  Genitourinary:  Genitourinary Comments: Continent   Musculoskeletal: Normal range of motion. She exhibits no edema, tenderness or deformity.  Lymphadenopathy:    She has no cervical adenopathy.  Neurological: She is oriented to person, place, and time.  Skin: Skin is warm and dry. No rash noted. No erythema. No pallor.  Psychiatric: She has a normal mood and affect.    Labs reviewed: Basic Metabolic Panel:  Recent Labs  10/24/15 1153  03/07/16 1116 03/09/16 0528 03/12/16 0907 03/19/16  NA  --   < > 139 137 136 143  K  --   < > 4.1 4.2 4.6 4.7  CL  --   < > 105 103 100*  --   CO2  --   < > 27 26 30   --   GLUCOSE  --   < > 136* 130* 231*  --   BUN  --   < >  14 27* 19 14  CREATININE  --   < > 1.05* 1.23* 1.11* 0.9  CALCIUM  --   < > 9.3 9.0 8.8*  --   MG 1.9  --   --   --   --   --    < > = values in this interval not displayed. Liver Function Tests:  Recent Labs  03/07/16 1116 03/09/16 0528 03/12/16 0907 03/19/16  AST 20 19 20 21   ALT 17 15 18  41*  ALKPHOS 56 54 48 72  BILITOT 0.9 0.7 0.9  --   PROT 6.3* 6.0* 5.9*  --   ALBUMIN 3.6 3.5 2.9*  --     Recent Labs  12/30/15 1625 03/07/16 1116  LIPASE 109* 33   CBC:  Recent Labs  03/06/16 1725 03/09/16 0528 03/13/16 0406 03/19/16  WBC 6.5 8.8 8.1 6.4  HGB 13.6 13.6 11.8* 13.5  HCT 41.5 41.3 36.9 41  MCV 95.0 95.6 96.3  --   PLT 226 204 192 305   Cardiac Enzymes:  Recent Labs  03/06/16 2000 03/07/16 0224 03/07/16 1116  TROPONINI <0.03 <0.03 <0.03     Recent Labs  03/12/16 2009 03/13/16 0728 03/13/16 1123  GLUCAP 166* 108* 207*   Assessment/Plan:   HTN B/p stable. Continue on Amlodipine. Monitor BMP.   Afib  HR controlled. Continue on Apixaban.  COPD Continue on Albuterol  GERD Continue on Pepcid   Type 2 DM  CBG stable. Continue on Glipizide. Hgb A1C with PCP.   Neuropathy Continue on Gabapentin.   Hyperlipidemia  Continue on atorvastatin. Monitor lipid panel.   Patient is being discharged with the following home health services:  -  PT/OT to continue with ROM, Exercise, Gait stability and muscle strengthening.   Patient is being discharged with the following durable medical equipment:   -She does not require any DME.  Patient has been advised to f/u with their PCP in 1-2 weeks to bring them up to date on their rehab stay.  Social services at facility was responsible for arranging this appointment.  Pt was provided with a 30 day supply of prescriptions for medications and refills must be obtained from their PCP.  For controlled substances, a more limited supply may be provided adequate until PCP appointment only.  Future labs/tests needed:  CBC, BMP in 1-2 weeks with PCP

## 2016-03-30 NOTE — Patient Instructions (Addendum)
Medication Instructions:  Take protonix 30 minutes before meals twice a day Pepcid 20 mg at bedtime for one week than discontinue Change Norvasc 5 mg  (amolodipine) to bedtime  Labwork: None ordered  Testing/Procedures: None ordered  Follow-Up: Follow up with Richardson Dopp in 3 months December 17 at 10:45 am.   Any Other Special Instructions Will Be Listed Below (If Applicable).  If you need a refill on your cardiac medications before your next appointment, please call your pharmacy.

## 2016-03-30 NOTE — Telephone Encounter (Signed)
I left a very detailed message for pt on her home # in regards to her appt today to see Brynda Rim. PA. Today's appt looks like it was scheduled earlier in August, pt had seen Dr. Harrington Challenger 8/24 after and ED visit 8/15. Asked pt how she is feeling. Gave option if ok does she want to cancel appt. Pt said no she wants to still keep appt today, said she is still having the pain over the right breast area. I asked her what did the ED say about right breast pain, pt states to me she was told it was nothing. Pt then states to me she wants to keep her appt today to see Richardson Dopp, PAC. Pt also states she is in a rehab facility at this time, this is the reason we could not reach her at her home #.

## 2016-07-09 ENCOUNTER — Ambulatory Visit: Payer: Medicare Other | Admitting: Physician Assistant

## 2016-08-11 ENCOUNTER — Emergency Department (HOSPITAL_COMMUNITY)
Admission: EM | Admit: 2016-08-11 | Discharge: 2016-08-11 | Disposition: A | Discharge status: Home / Self Care | Payer: Medicare Other | Attending: Emergency Medicine | Admitting: Emergency Medicine

## 2016-08-11 ENCOUNTER — Emergency Department (HOSPITAL_COMMUNITY): Payer: Medicare Other

## 2016-08-11 DIAGNOSIS — Y92009 Unspecified place in unspecified non-institutional (private) residence as the place of occurrence of the external cause: Secondary | ICD-10-CM | POA: Insufficient documentation

## 2016-08-11 DIAGNOSIS — E1122 Type 2 diabetes mellitus with diabetic chronic kidney disease: Secondary | ICD-10-CM | POA: Diagnosis not present

## 2016-08-11 DIAGNOSIS — W1839XA Other fall on same level, initial encounter: Secondary | ICD-10-CM | POA: Insufficient documentation

## 2016-08-11 DIAGNOSIS — Y999 Unspecified external cause status: Secondary | ICD-10-CM | POA: Diagnosis not present

## 2016-08-11 DIAGNOSIS — I129 Hypertensive chronic kidney disease with stage 1 through stage 4 chronic kidney disease, or unspecified chronic kidney disease: Secondary | ICD-10-CM | POA: Diagnosis not present

## 2016-08-11 DIAGNOSIS — J449 Chronic obstructive pulmonary disease, unspecified: Secondary | ICD-10-CM | POA: Diagnosis not present

## 2016-08-11 DIAGNOSIS — S300XXA Contusion of lower back and pelvis, initial encounter: Secondary | ICD-10-CM | POA: Diagnosis not present

## 2016-08-11 DIAGNOSIS — Y939 Activity, unspecified: Secondary | ICD-10-CM | POA: Diagnosis not present

## 2016-08-11 DIAGNOSIS — Z955 Presence of coronary angioplasty implant and graft: Secondary | ICD-10-CM | POA: Insufficient documentation

## 2016-08-11 DIAGNOSIS — N183 Chronic kidney disease, stage 3 (moderate): Secondary | ICD-10-CM | POA: Diagnosis not present

## 2016-08-11 DIAGNOSIS — Z79899 Other long term (current) drug therapy: Secondary | ICD-10-CM | POA: Diagnosis not present

## 2016-08-11 DIAGNOSIS — E1140 Type 2 diabetes mellitus with diabetic neuropathy, unspecified: Secondary | ICD-10-CM | POA: Diagnosis not present

## 2016-08-11 DIAGNOSIS — I2510 Atherosclerotic heart disease of native coronary artery without angina pectoris: Secondary | ICD-10-CM | POA: Diagnosis not present

## 2016-08-11 DIAGNOSIS — Z85828 Personal history of other malignant neoplasm of skin: Secondary | ICD-10-CM | POA: Diagnosis not present

## 2016-08-11 DIAGNOSIS — W19XXXA Unspecified fall, initial encounter: Secondary | ICD-10-CM

## 2016-08-11 DIAGNOSIS — S3992XA Unspecified injury of lower back, initial encounter: Secondary | ICD-10-CM | POA: Diagnosis present

## 2016-08-11 DIAGNOSIS — S20229A Contusion of unspecified back wall of thorax, initial encounter: Secondary | ICD-10-CM

## 2016-08-11 LAB — CBG MONITORING, ED: Glucose-Capillary: 106 mg/dL — ABNORMAL HIGH (ref 65–99)

## 2016-08-11 MED ORDER — FENTANYL CITRATE (PF) 100 MCG/2ML IJ SOLN
50.0000 ug | Freq: Once | INTRAMUSCULAR | Status: AC
  Administered 2016-08-11: 50 ug via NASAL
  Filled 2016-08-11: qty 2

## 2016-08-11 MED ORDER — METHOCARBAMOL 500 MG PO TABS: 500.0000 mg | ORAL_TABLET | Freq: Three times a day (TID) | ORAL | 0 refills | Status: DC | PRN

## 2016-08-11 NOTE — Discharge Instructions (Signed)
Use ice on the area for comfort. You can take the tramadol you already have for pain. Take the robaxin for the muscle spasms and pain.  Recheck if you get a fever, shortness of breath, see blood in your urine. Dr Melford Aase will need to change your pain medication if you need something stronger.

## 2016-08-11 NOTE — ED Notes (Signed)
CBG 106. Did not transmit under Pt #

## 2016-08-11 NOTE — ED Notes (Signed)
Pt transported to XR.  

## 2016-08-11 NOTE — ED Triage Notes (Signed)
Pt arrived via [facility] after falling at home. Per EMS, pt seems to have lost balance and fallen backward while attempting to ambulating to couch with walker. Pt denies LOC or hitting head; c/o left sided mid-thoracic back pain. 2/10 pain at rest, 10/10 with movement. Pt is alert and oriented x4. VSS upon arrival to ED.

## 2016-08-11 NOTE — ED Provider Notes (Signed)
[facility] DEPT Provider Note   CSN: [id number] Arrival date & time: 08/11/16  [id number]  By signing my name below, I, Jeanell Sparrow, attest that this documentation has been prepared under the direction and in the presence of Rolland Porter, MD . Electronically Signed: Jeanell Sparrow, Scribe. 08/11/2016. 12:50 AM.  Time seen 12:49 AM  History   Chief Complaint Chief Complaint  Patient presents with  . Fall   The history is provided by the patient. No language interpreter was used.   HPI Comments: GABREAL MONNOT is a 83 y.o. female who presents to the Emergency Department complaining of a fall that occurred today. She states she lost balance and fell backwards while ambulating with a walker after ambulating to the bathroom and attempting to sit on the couch in the living room. She denies any head injury or LOC. She reports associated right sided mid back pain. Her pain is exacerbated by movement. No alleviating factors. She denies any neck pain, SOB, other complaints.    PCP: Chesley Noon, MD  Past Medical History:  Diagnosis Date  . 1St degree AV block   . Acute renal failure ([place])    due to dehydration-Sept 2009; Normal creat 1.2 on fu 05/13/2009  . Allergic rhinitis   . Anemia    NOS chronic disease. Hgb 11.6gm% 04/14/2009  . Bradycardia   . CAD (coronary artery disease)    a. s/p DES mLAD 2005. b. Cutting balloon angioplasty for ISR 2006. c. LHC 08/2010 nonobstructive. d. Myoview 06/2012: small anteroapical infarct/mod inferolat wall infarct but no ischemia, EF 75%.  . Cancer ([place])    on nose and had it removed  . Chronic anticoagulation   . CKD (chronic kidney disease), stage III   . DEMENTIA    slight  . Diabetes mellitus type II   . Diverticulitis of colon   . DM neuropathy, type II diabetes mellitus ([place])   . GERD (gastroesophageal reflux disease)   . HTN (hypertension)   . Hx of colonoscopy   . Hyperlipidemia   . IBS (irritable bowel syndrome)   . Multinodular  goiter   . Obesity    BMI-37  . Orthostatic hypotension   . Osteoarthritis    lower back  . Osteoporosis   . Pneumonia   . PVC's (premature ventricular contractions)    a. seen on telemetry 02/2016.  Marland Kitchen RBBB   . Rheumatoid arthritis ([place])   . Spinal stenosis    djd lumbar    Patient Active Problem List   Diagnosis Date Noted  . PVC's (premature ventricular contractions) 03/07/2016  . Depression 03/06/2016  . Chest pain at rest 03/06/2016  . Unstable angina ([place]) 10/24/2015  . Chronic anticoagulation for A fib, CHA2DS2VASc = 6 12/18/2014  . Chest pain, negative MI, negative Nuc, may be GI 12/17/2014  . Diabetes mellitus type II, non insulin dependent ([place])   . HTN (hypertension)   . Tachycardia 08/07/2011  . Atrial fibrillation, permanent 06/25/2011  . Near Syncope 06/06/2011  . Coronary artery disease 04/05/2011  . Bradycardia 12/09/2009  . ACUTE BRONCHITIS 06/17/2009  . COPD, moderate ([place]) 06/17/2009  . PULMONARY NODULE, RIGHT UPPER LOBE 06/07/2009  . SHORTNESS OF BREATH (SOB) 06/07/2009  . POSTURAL HYPOTENSION 05/23/2009  . DEHYDRATION 05/13/2009  . ABDOMINAL PAIN, LOWER 01/21/2009  . HOARSENESS, CHRONIC 12/03/2007  . GOITER 09/30/2007  . DIABETIC PERIPHERAL NEUROPATHY 09/30/2007  . OBESITY 09/30/2007  . HIATAL HERNIA 09/30/2007  . OSTEOARTHRITIS 09/30/2007  . SPINAL STENOSIS 09/30/2007  .  PERIPHERAL EDEMA 09/30/2007  . HLD (hyperlipidemia) 02/12/2007  . ANEMIA-NOS 02/12/2007  . Neuropathy ([place]) 02/12/2007  . Allergic rhinitis, cause unspecified 02/12/2007  . GERD 02/12/2007  . DIVERTICULOSIS, COLON 02/12/2007    Past Surgical History:  Procedure Laterality Date  . ABDOMINAL HYSTERECTOMY    . bilateral arthroscopic knee surgery    . carpel tunnel wrist rt    . CHOLECYSTECTOMY    . CORONARY ANGIOPLASTY     stent  . NASAL SINUS SURGERY     x2  . stent surgery      OB History    No data available       Home Medications    Prior to Admission  medications   Medication Sig Start Date End Date Taking? Authorizing Provider  albuterol (PROVENTIL HFA;VENTOLIN HFA) 108 (90 Base) MCG/ACT inhaler Inhale 2 puffs into the lungs every 6 (six) hours as needed for wheezing or shortness of breath.  11/11/15 11/10/16  Historical Provider, MD  alendronate (FOSAMAX) 70 MG tablet TAKE 1 TABLET BY MOUTH EVERY 7 DAYS ON TUESDAYS WITH A FULL GLASS OF WATER & ON AN EMPTY STOMACH 02/22/16   Fay Records, MD  amLODipine (NORVASC) 5 MG tablet Take 1 tablet by mouth at bedtime. 03/20/16   Historical Provider, MD  apixaban (ELIQUIS) 5 MG TABS tablet Take 1 tablet (5 mg total) by mouth 2 (two) times daily. 01/31/15   Liliane Shi, PA-C  atorvastatin (LIPITOR) 20 MG tablet Take 20 mg by mouth daily.    Historical Provider, MD  Calcium Carbonate-Vitamin D (CALCIUM 500/D PO) Take 1,000 mg by mouth daily.     Historical Provider, MD  colchicine 0.6 MG tablet Take 0.6 mg by mouth daily.    Historical Provider, MD  famotidine (PEPCID) 20 MG tablet Take 1 tablet (20 mg total) by mouth at bedtime. 03/30/16 04/07/16  Liliane Shi, PA-C  fluticasone (FLONASE) 50 MCG/ACT nasal spray Place 1 spray into both nostrils daily as needed for allergies.  01/17/16   Historical Provider, MD  gabapentin (NEURONTIN) 600 MG tablet Take 600 mg by mouth 2 (two) times daily.  10/25/13   Historical Provider, MD  glipiZIDE (GLUCOTROL XL) 5 MG 24 hr tablet Take 5 mg by mouth every evening.     Historical Provider, MD  Melatonin 3 MG TABS Take 1 tablet (3 mg total) by mouth at bedtime. 03/20/16   Dinah C Ngetich, NP  methocarbamol (ROBAXIN) 500 MG tablet Take 1 tablet (500 mg total) by mouth every 8 (eight) hours as needed for muscle spasms. 08/11/16   Rolland Porter, MD  Multiple Vitamins-Minerals (PRESERVISION AREDS PO) Take 1 tablet by mouth 2 (two) times daily.    Historical Provider, MD  nitroGLYCERIN (NITROSTAT) 0.4 MG SL tablet Place 0.4 mLs under the tongue every 5 (five) minutes as needed for chest pain  (Max 3 doses).  08/16/14   Historical Provider, MD  ondansetron (ZOFRAN) 8 MG tablet Take 1 tablet (8 mg total) by mouth every 8 (eight) hours as needed for nausea or vomiting. 12/30/15   Daleen Bo, MD  oxybutynin (DITROPAN) 5 MG tablet Take 5 mg by mouth daily.    Historical Provider, MD  pantoprazole (PROTONIX) 40 MG tablet Take 1 tablet (40 mg total) by mouth 2 (two) times daily. Take 30 minutes before meals. 03/30/16   Liliane Shi, PA-C  Sennosides-Docusate Sodium (SENNA S PO) Take by mouth. Take 2 tablets at bedtime for constipation    Historical Provider, MD  traMADol (ULTRAM) 50 MG tablet Take 2 tablets (100 mg total) by mouth daily as needed for moderate pain. 03/13/16   Reyne Dumas, MD    Family History Family History  Problem Relation Age of Onset  . Heart attack Father   . Breast cancer Sister   . Heart disease Brother   . Heart attack Son   . Heart attack Daughter   . Colon cancer Neg Hx   . Stroke Neg Hx     Social History Social History  Substance Use Topics  . Smoking status: Never Smoker  . Smokeless tobacco: Never Used  . Alcohol use No  was living alone until the past 1 1/2 weeks, no living with her SIL Uses a walker   Allergies   Amoxicillin   Review of Systems Review of Systems  All other systems reviewed and are negative.  A complete 10 system review of systems was obtained and all systems are negative except as noted in the HPI and PMH.   Physical Exam Updated Vital Signs BP 127/95 (BP Location: Right Arm)   Temp 98.4 F (36.9 C) (Oral)   Resp 20   SpO2 100%   Vital signs normal    Physical Exam  Constitutional: She is oriented to person, place, and time. She appears well-developed and well-nourished.  Non-toxic appearance. She does not appear ill. No distress.  Pt appears to have spasms of pain, she intermittently jumps  HENT:  Head: Normocephalic and atraumatic.  Right Ear: External ear normal.  Left Ear: External ear normal.  Nose:  Nose normal.  Mouth/Throat: Mucous membranes are normal.  Eyes: Conjunctivae and EOM are normal. Pupils are equal, round, and reactive to light.  Neck: Normal range of motion and full passive range of motion without pain. Neck supple.  Cardiovascular: Normal rate, regular rhythm and normal heart sounds.  Exam reveals no gallop and no friction rub.   No murmur heard. Pulmonary/Chest: Effort normal and breath sounds normal. No respiratory distress. She has no wheezes. She has no rhonchi. She has no rales. She exhibits no tenderness and no crepitus.  Abdominal: Soft. Normal appearance and bowel sounds are normal. She exhibits no distension. There is no tenderness. There is no rebound and no guarding.  Musculoskeletal: Normal range of motion. She exhibits no edema.       Back:  Lower thoracic midline TTP. Nontender lumbar spine. Nontender over the posterior chest wall or flank. Moves all extremities well.   Neurological: She is alert and oriented to person, place, and time. She has normal strength. No cranial nerve deficit.  No motor weakness of the BLE.  Skin: Skin is warm, dry and intact. No rash noted. No erythema. No pallor.  Psychiatric: She has a normal mood and affect. Her speech is normal and behavior is normal. Her mood appears not anxious.  Nursing note and vitals reviewed.    ED Treatments / Results  Labs (all labs ordered are listed, but only abnormal results are displayed) Results for orders placed or performed during the hospital encounter of 08/11/16  CBG monitoring, ED  Result Value Ref Range   Glucose-Capillary 106 (H) 65 - 99 mg/dL      EKG  EKG Interpretation None       Radiology Dg Thoracic Spine W/swimmers  Result Date: 08/11/2016 CLINICAL DATA:  Fall at home walking to the couch with a walker. Now with upper thoracic back pain. Initial encounter. EXAM: THORACIC SPINE - 3 VIEWS COMPARISON:  Chest radiographs  03/10/2016 FINDINGS: The alignment is maintained.  Vertebral body heights are maintained. Flowing anterior osteophytes are similar to prior exam. Age-related disc space narrowing. Posterior elements appear intact. There is no paravertebral soft tissue abnormality. IMPRESSION: No evidence of acute thoracic spine fracture. Multilevel degenerative change. Electronically Signed   By: Jeb Levering M.D.   On: 08/11/2016 02:10   Ct Thoracic Spine Wo Contrast  Result Date: 08/11/2016 CLINICAL DATA:  Thoracic back pain after fall at home. EXAM: CT THORACIC SPINE WITHOUT CONTRAST TECHNIQUE: Multidetector CT images of the thoracic were obtained using the standard protocol without intravenous contrast. COMPARISON:  None. FINDINGS: Alignment: Normal. Vertebrae: No acute fracture or focal pathologic process. Vertebral body heights are preserved. The posterior elements appear intact. Spine image from the C7 through L3. Paraspinal and other soft tissues: No paravertebral hematoma or edema. No evidence of canal hematoma. Elongated posterior right lobe of the thyroid gland is unchanged from chest CT 07/17/2012. Atherosclerosis is noted. Disc levels: Mild diffuse disc space narrowing. Flowing anterior osteophytes throughout. IMPRESSION: No acute fracture or subluxation of the thoracic spine, or the upper lumbar spine through L3. Multilevel degenerative change. Electronically Signed   By: Jeb Levering M.D.   On: 08/11/2016 05:41    Procedures Procedures (including critical care time)  Medications Ordered in ED Medications  fentaNYL (SUBLIMAZE) injection 50 mcg (50 mcg Nasal Given 08/11/16 0116)     Initial Impression / Assessment and Plan / ED Course  I have reviewed the triage vital signs and the nursing notes.  Pertinent labs & imaging results that were available during my care of the patient were reviewed by me and considered in my medical decision making (see chart for details).     DIAGNOSTIC STUDIES: Oxygen Saturation is 100% on RA, normal by my  interpretation.    COORDINATION OF CARE: 12:55 AM- Pt advised of plan for treatment and pt agrees. Pt given nasal fentanyl. xrays ordered of her thoracic spine where she was complaining of pain.   Patient was rechecked after her x-rays. She appears to be much more comfortable. She was laying on her side so I could actually see her back. There is no bruising seen. She has well localized pain at the lower thoracic upper lumbar area. At this point CT scan was ordered to look for occult fracture.  Patient was informed her CT scan does not show any evidence of a fracture. She was discharged home.  Review of the [place] shows patient gets #90 tramadol 50 mg tablets from her PCP, they were last filled on January 4.  Final Clinical Impressions(s) / ED Diagnoses   Final diagnoses:  Fall at home, initial encounter  Contusion of back, unspecified laterality, initial encounter    New Prescriptions New Prescriptions   METHOCARBAMOL (ROBAXIN) 500 MG TABLET    Take 1 tablet (500 mg total) by mouth every 8 (eight) hours as needed for muscle spasms.   Plan discharge  Rolland Porter, MD, FACEP    I personally performed the services described in this documentation, which was scribed in my presence. The recorded information has been reviewed and considered.  Rolland Porter, MD, [name], MD 08/11/16 [id number]

## 2016-09-20 ENCOUNTER — Telehealth: Payer: Self-pay | Admitting: Internal Medicine

## 2016-09-20 NOTE — Telephone Encounter (Deleted)
Error

## 2016-09-28 ENCOUNTER — Ambulatory Visit: Payer: Medicare Other | Admitting: Internal Medicine

## 2016-10-17 ENCOUNTER — Encounter: Payer: Self-pay | Admitting: Internal Medicine

## 2016-10-18 ENCOUNTER — Encounter (HOSPITAL_COMMUNITY): Payer: Self-pay | Admitting: Emergency Medicine

## 2016-10-18 ENCOUNTER — Emergency Department (HOSPITAL_COMMUNITY)
Admission: EM | Admit: 2016-10-18 | Discharge: 2016-10-18 | Disposition: A | Discharge status: Home / Self Care | Payer: Medicare Other | Attending: Emergency Medicine | Admitting: Emergency Medicine

## 2016-10-18 ENCOUNTER — Emergency Department (HOSPITAL_COMMUNITY): Payer: Medicare Other

## 2016-10-18 DIAGNOSIS — W19XXXA Unspecified fall, initial encounter: Secondary | ICD-10-CM

## 2016-10-18 DIAGNOSIS — Z7901 Long term (current) use of anticoagulants: Secondary | ICD-10-CM | POA: Insufficient documentation

## 2016-10-18 DIAGNOSIS — Z79899 Other long term (current) drug therapy: Secondary | ICD-10-CM | POA: Insufficient documentation

## 2016-10-18 DIAGNOSIS — W1839XA Other fall on same level, initial encounter: Secondary | ICD-10-CM | POA: Diagnosis not present

## 2016-10-18 DIAGNOSIS — M25512 Pain in left shoulder: Secondary | ICD-10-CM | POA: Diagnosis not present

## 2016-10-18 DIAGNOSIS — Y9389 Activity, other specified: Secondary | ICD-10-CM | POA: Diagnosis not present

## 2016-10-18 DIAGNOSIS — I129 Hypertensive chronic kidney disease with stage 1 through stage 4 chronic kidney disease, or unspecified chronic kidney disease: Secondary | ICD-10-CM | POA: Insufficient documentation

## 2016-10-18 DIAGNOSIS — Z85828 Personal history of other malignant neoplasm of skin: Secondary | ICD-10-CM | POA: Insufficient documentation

## 2016-10-18 DIAGNOSIS — I2510 Atherosclerotic heart disease of native coronary artery without angina pectoris: Secondary | ICD-10-CM | POA: Insufficient documentation

## 2016-10-18 DIAGNOSIS — E1122 Type 2 diabetes mellitus with diabetic chronic kidney disease: Secondary | ICD-10-CM | POA: Insufficient documentation

## 2016-10-18 DIAGNOSIS — Y92480 Sidewalk as the place of occurrence of the external cause: Secondary | ICD-10-CM | POA: Diagnosis not present

## 2016-10-18 DIAGNOSIS — Y999 Unspecified external cause status: Secondary | ICD-10-CM | POA: Insufficient documentation

## 2016-10-18 DIAGNOSIS — R51 Headache: Secondary | ICD-10-CM | POA: Insufficient documentation

## 2016-10-18 DIAGNOSIS — Z7982 Long term (current) use of aspirin: Secondary | ICD-10-CM | POA: Diagnosis not present

## 2016-10-18 DIAGNOSIS — E1140 Type 2 diabetes mellitus with diabetic neuropathy, unspecified: Secondary | ICD-10-CM | POA: Insufficient documentation

## 2016-10-18 DIAGNOSIS — J449 Chronic obstructive pulmonary disease, unspecified: Secondary | ICD-10-CM | POA: Insufficient documentation

## 2016-10-18 DIAGNOSIS — N183 Chronic kidney disease, stage 3 (moderate): Secondary | ICD-10-CM | POA: Insufficient documentation

## 2016-10-18 DIAGNOSIS — Z955 Presence of coronary angioplasty implant and graft: Secondary | ICD-10-CM | POA: Insufficient documentation

## 2016-10-18 LAB — I-STAT CHEM 8, ED
BUN: 18 mg/dL (ref 6–20)
CALCIUM ION: 1.16 mmol/L (ref 1.15–1.40)
Chloride: 103 mmol/L (ref 101–111)
Creatinine, Ser: 1.2 mg/dL — ABNORMAL HIGH (ref 0.44–1.00)
GLUCOSE: 108 mg/dL — AB (ref 65–99)
HEMATOCRIT: 33 % — AB (ref 36.0–46.0)
Hemoglobin: 11.2 g/dL — ABNORMAL LOW (ref 12.0–15.0)
Potassium: 4.9 mmol/L (ref 3.5–5.1)
Sodium: 139 mmol/L (ref 135–145)
TCO2: 28 mmol/L (ref 0–100)

## 2016-10-18 MED ORDER — ACETAMINOPHEN 500 MG PO TABS
1000.0000 mg | ORAL_TABLET | Freq: Once | ORAL | Status: AC
  Administered 2016-10-18: 1000 mg via ORAL
  Filled 2016-10-18: qty 2

## 2016-10-18 NOTE — ED Triage Notes (Signed)
Per [facility], Pt had a fall witnessed by her grand-daughther. Pt was stepping onto ledge of sidewalk when her knees gave out. Pt has hx of chronic knee problems. Pt hit her Left shoulder on her grand-daughters car and her head hit the sidewalk. Pt is unsure if she lost consciousness. Pt has hematoma to R side of head. Pt states she has had 2 or 3 falls recently. Pt in no acute distress

## 2016-10-18 NOTE — Discharge Instructions (Signed)
Follow up with your family doc.  Discuss your frequent falls and discuss if being on a blood thinner is right for you.  Take your arm out of the sling and move it in circles at least a couple times a day.  If your shoulder is still bothering you in a week they may want to rexray it.   Tylenol 1-2 tabs po q4h prn

## 2016-10-18 NOTE — ED Notes (Signed)
Pt is in stable condition upon d/c and is escorted from ED via wheelchair. 

## 2016-10-18 NOTE — ED Notes (Signed)
Patient transported to CT 

## 2016-10-18 NOTE — ED Provider Notes (Signed)
[facility] DEPT Provider Note   CSN: [id number] Arrival date & time: 10/18/16  1627     History   Chief Complaint Chief Complaint  Patient presents with  . Fall    HPI [name] is a 83 y.o. female.  83 yo F with a chief complaint of a fall. Patient is unsure why exactly she fell down. She does not think she passed out she thinks maybe she lost her balance. As of the third fall in the past 6 months or so. She fell and landed on her left side. Complaining of pain mostly to the left proximal humerus. She did strike her head. Denies loss of consciousness. Denies confusion. Denies chest pain abdominal pain. Denies lower extremity pain. Denies back pain. Complaining of some left-sided neck pain. Denies any other injury.   The history is provided by the patient.  Fall  This is a recurrent problem. The current episode started 1 to 2 hours ago. The problem occurs constantly. The problem has not changed since onset.Associated symptoms include headaches. Pertinent negatives include no chest pain and no shortness of breath. Nothing aggravates the symptoms. Nothing relieves the symptoms. She has tried nothing for the symptoms. The treatment provided no relief.    Past Medical History:  Diagnosis Date  . 1st degree AV block   . Acute renal failure ([place])    due to dehydration-Sept 2009; Normal creat 1.2 on fu 05/13/2009  . Allergic rhinitis   . Anemia    NOS chronic disease. Hgb 11.6gm% 04/14/2009  . Bradycardia   . CAD (coronary artery disease)    a. s/p DES mLAD 2005. b. Cutting balloon angioplasty for ISR 2006. c. LHC 08/2010 nonobstructive. d. Myoview 06/2012: small anteroapical infarct/mod inferolat wall infarct but no ischemia, EF 75%.  . Cancer ([place])    on nose and had it removed  . Chronic anticoagulation   . CKD (chronic kidney disease), stage III   . DEMENTIA    slight  . Diabetes mellitus type II   . Diverticulitis of colon   . DM neuropathy, type II diabetes  mellitus ([place])   . GERD (gastroesophageal reflux disease)   . HTN (hypertension)   . Hx of colonoscopy   . Hyperlipidemia   . IBS (irritable bowel syndrome)   . Multinodular goiter   . Obesity    BMI-37  . Orthostatic hypotension   . Osteoarthritis    lower back  . Osteoporosis   . Pneumonia   . PVC's (premature ventricular contractions)    a. seen on telemetry 02/2016.  Marland Kitchen RBBB   . Rheumatoid arthritis ([place])   . Spinal stenosis    djd lumbar    Patient Active Problem List   Diagnosis Date Noted  . PVC's (premature ventricular contractions) 03/07/2016  . Depression 03/06/2016  . Chest pain at rest 03/06/2016  . Unstable angina ([place]) 10/24/2015  . Chronic anticoagulation for A fib, CHA2DS2VASc = 6 12/18/2014  . Chest pain, negative MI, negative Nuc, may be GI 12/17/2014  . Diabetes mellitus type II, non insulin dependent ([place])   . HTN (hypertension)   . Tachycardia 08/07/2011  . Atrial fibrillation, permanent 06/25/2011  . Near Syncope 06/06/2011  . Coronary artery disease 04/05/2011  . Bradycardia 12/09/2009  . ACUTE BRONCHITIS 06/17/2009  . COPD, moderate ([place]) 06/17/2009  . PULMONARY NODULE, RIGHT UPPER LOBE 06/07/2009  . SHORTNESS OF BREATH (SOB) 06/07/2009  . POSTURAL HYPOTENSION 05/23/2009  . DEHYDRATION 05/13/2009  . ABDOMINAL PAIN, LOWER 01/21/2009  .  HOARSENESS, CHRONIC 12/03/2007  . GOITER 09/30/2007  . DIABETIC PERIPHERAL NEUROPATHY 09/30/2007  . OBESITY 09/30/2007  . HIATAL HERNIA 09/30/2007  . OSTEOARTHRITIS 09/30/2007  . SPINAL STENOSIS 09/30/2007  . PERIPHERAL EDEMA 09/30/2007  . HLD (hyperlipidemia) 02/12/2007  . ANEMIA-NOS 02/12/2007  . Neuropathy ([place]) 02/12/2007  . Allergic rhinitis, cause unspecified 02/12/2007  . GERD 02/12/2007  . DIVERTICULOSIS, COLON 02/12/2007    Past Surgical History:  Procedure Laterality Date  . ABDOMINAL HYSTERECTOMY    . bilateral arthroscopic knee surgery    . carpel tunnel wrist rt    . CHOLECYSTECTOMY      . CORONARY ANGIOPLASTY     stent  . NASAL SINUS SURGERY     x2  . stent surgery      OB History    No data available       Home Medications    Prior to Admission medications   Medication Sig Start Date End Date Taking? Authorizing Provider  albuterol (PROVENTIL HFA;VENTOLIN HFA) 108 (90 Base) MCG/ACT inhaler Inhale 2 puffs into the lungs every 6 (six) hours as needed for wheezing or shortness of breath.  11/11/15 11/10/16  Historical Provider, MD  alendronate (FOSAMAX) 70 MG tablet TAKE 1 TABLET BY MOUTH EVERY 7 DAYS ON TUESDAYS WITH A FULL GLASS OF WATER & ON AN EMPTY STOMACH 02/22/16   Fay Records, MD  amLODipine (NORVASC) 5 MG tablet Take 1 tablet by mouth at bedtime. 03/20/16   Historical Provider, MD  apixaban (ELIQUIS) 5 MG TABS tablet Take 1 tablet (5 mg total) by mouth 2 (two) times daily. 01/31/15   Liliane Shi, PA-C  atorvastatin (LIPITOR) 20 MG tablet Take 20 mg by mouth daily.    Historical Provider, MD  Calcium Carbonate-Vitamin D (CALCIUM 500/D PO) Take 1,000 mg by mouth daily.     Historical Provider, MD  colchicine 0.6 MG tablet Take 0.6 mg by mouth daily.    Historical Provider, MD  famotidine (PEPCID) 20 MG tablet Take 1 tablet (20 mg total) by mouth at bedtime. 03/30/16 04/07/16  Liliane Shi, PA-C  fluticasone (FLONASE) 50 MCG/ACT nasal spray Place 1 spray into both nostrils daily as needed for allergies.  01/17/16   Historical Provider, MD  gabapentin (NEURONTIN) 600 MG tablet Take 600 mg by mouth 2 (two) times daily.  10/25/13   Historical Provider, MD  glipiZIDE (GLUCOTROL XL) 5 MG 24 hr tablet Take 5 mg by mouth every evening.     Historical Provider, MD  Melatonin 3 MG TABS Take 1 tablet (3 mg total) by mouth at bedtime. 03/20/16   Dinah C Ngetich, NP  methocarbamol (ROBAXIN) 500 MG tablet Take 1 tablet (500 mg total) by mouth every 8 (eight) hours as needed for muscle spasms. 08/11/16   Rolland Porter, MD  Multiple Vitamins-Minerals (PRESERVISION AREDS PO) Take 1 tablet  by mouth 2 (two) times daily.    Historical Provider, MD  nitroGLYCERIN (NITROSTAT) 0.4 MG SL tablet Place 0.4 mLs under the tongue every 5 (five) minutes as needed for chest pain (Max 3 doses).  08/16/14   Historical Provider, MD  ondansetron (ZOFRAN) 8 MG tablet Take 1 tablet (8 mg total) by mouth every 8 (eight) hours as needed for nausea or vomiting. 12/30/15   Daleen Bo, MD  oxybutynin (DITROPAN) 5 MG tablet Take 5 mg by mouth daily.    Historical Provider, MD  pantoprazole (PROTONIX) 40 MG tablet Take 1 tablet (40 mg total) by mouth 2 (two) times daily.  Take 30 minutes before meals. 03/30/16   Liliane Shi, PA-C  Sennosides-Docusate Sodium (SENNA S PO) Take by mouth. Take 2 tablets at bedtime for constipation    Historical Provider, MD  traMADol (ULTRAM) 50 MG tablet Take 2 tablets (100 mg total) by mouth daily as needed for moderate pain. 03/13/16   Reyne Dumas, MD    Family History Family History  Problem Relation Age of Onset  . Heart attack Father   . Breast cancer Sister   . Heart disease Brother   . Heart attack Son   . Heart attack Daughter   . Colon cancer Neg Hx   . Stroke Neg Hx     Social History Social History  Substance Use Topics  . Smoking status: Never Smoker  . Smokeless tobacco: Never Used  . Alcohol use No     Allergies   Amoxicillin   Review of Systems Review of Systems  Constitutional: Negative for chills and fever.  HENT: Negative for congestion and rhinorrhea.   Eyes: Negative for redness and visual disturbance.  Respiratory: Negative for shortness of breath and wheezing.   Cardiovascular: Negative for chest pain and palpitations.  Gastrointestinal: Negative for nausea and vomiting.  Genitourinary: Negative for dysuria and urgency.  Musculoskeletal: Positive for arthralgias, myalgias and neck pain. Negative for back pain.  Skin: Negative for pallor and wound.  Neurological: Positive for headaches. Negative for dizziness.     Physical  Exam Updated Vital Signs BP 108/85 (BP Location: Right Arm)   Pulse 80   Temp 98.1 F (36.7 C) (Oral)   Resp 18   SpO2 96%   Physical Exam  Constitutional: She is oriented to person, place, and time. She appears well-developed and well-nourished. No distress.  HENT:  Head: Normocephalic and atraumatic.  Eyes: EOM are normal. Pupils are equal, round, and reactive to light.  Neck: Normal range of motion. Neck supple.  Cardiovascular: Normal rate and regular rhythm.  Exam reveals no gallop and no friction rub.   No murmur heard. Pulmonary/Chest: Effort normal. She has no wheezes. She has no rales.  Abdominal: Soft. She exhibits no distension and no mass. There is no tenderness. There is no guarding.  Musculoskeletal: She exhibits tenderness (TTP about the left proximal humerus). She exhibits no edema.  Pulse motor and sensation is intact distally. No midline tenderness to the C-spine on palpation but she does have some pain when rotating her head to the left.  Neurological: She is alert and oriented to person, place, and time.  Skin: Skin is warm and dry. She is not diaphoretic.  Psychiatric: She has a normal mood and affect. Her behavior is normal.  Nursing note and vitals reviewed.    ED Treatments / Results  Labs (all labs ordered are listed, but only abnormal results are displayed) Labs Reviewed  I-STAT CHEM 8, ED - Abnormal; Notable for the following:       Result Value   Creatinine, Ser 1.20 (*)    Glucose, Bld 108 (*)    Hemoglobin 11.2 (*)    HCT 33.0 (*)    All other components within normal limits    EKG  EKG Interpretation  Date/Time:  Thursday October 18 2016 18:16:05 EDT Ventricular Rate:  81 PR Interval:    QRS Duration: 122 QT Interval:  410 QTC Calculation: 476 R Axis:   -8 Text Interpretation:  Atrial fibrillation Right bundle branch block Abnormal ECG qrs voltage decreased Otherwise no significant change Confirmed by Amoree Newlon  MD, Quillian Quince [phone]) on  10/18/2016 6:28:54 PM       Radiology Ct Head Wo Contrast  Result Date: 10/18/2016 CLINICAL DATA:  Witnessed fall, knees gave out when stepping on to the LEFT of SI block, LEFT side neck pain, frontal headache, on blood thinners, history dementia, coronary artery disease, hypertension, type II diabetes mellitus EXAM: CT HEAD WITHOUT CONTRAST CT CERVICAL SPINE WITHOUT CONTRAST TECHNIQUE: Multidetector CT imaging of the head and cervical spine was performed following the standard protocol without intravenous contrast. Multiplanar CT image reconstructions of the cervical spine were also generated. COMPARISON:  CT head 09/24/2012, CT cervical spine 09/21/2012 FINDINGS: CT HEAD FINDINGS Brain: Generalized atrophy. Normal ventricular morphology. No midline shift or mass effect. Small vessel chronic ischemic changes of deep cerebral white matter. No intracranial hemorrhage, mass lesion, evidence of acute infarction, or extra-axial fluid collections. Vascular: Atherosclerotic calcification of internal carotid arteries and vertebral arteries at skullbase Skull: Intact.  Osseous demineralization. Sinuses/Orbits: Deformity with osseous thickening of the RIGHT maxillary sinus extending to the RIGHT ethmoid air cells, question related to prior trauma or chronic sinusitis. Remaining paranasal sinuses clear Other: N/A CT CERVICAL SPINE FINDINGS Alignment: Normal Skull base and vertebrae: Visualized skullbase intact. Multilevel facet degenerative changes. Old healed fracture of the posterior LEFT fourth rib. Vertebral body heights maintained without fracture or bone destruction. Soft tissues and spinal canal: Prevertebral soft tissues normal thickness. Scattered normal and upper normal size cervical nodes. Disc levels: Mild disc space narrowing and endplate spur formation at C5-C6 and C6-C7. Upper chest: Lung apices clear. Other: N/A IMPRESSION: Atrophy with small vessel chronic ischemic changes of deep cerebral white  matter. No acute intracranial abnormalities. Chronic RIGHT maxillary sinusitis versus prior trauma of the RIGHT maxilla. Mild degenerative disc and facet disease changes cervical spine. No acute cervical spine abnormalities. Electronically Signed   By: Lavonia Dana M.D.   On: 10/18/2016 17:33   Ct Cervical Spine Wo Contrast  Result Date: 10/18/2016 CLINICAL DATA:  Witnessed fall, knees gave out when stepping on to the LEFT of SI block, LEFT side neck pain, frontal headache, on blood thinners, history dementia, coronary artery disease, hypertension, type II diabetes mellitus EXAM: CT HEAD WITHOUT CONTRAST CT CERVICAL SPINE WITHOUT CONTRAST TECHNIQUE: Multidetector CT imaging of the head and cervical spine was performed following the standard protocol without intravenous contrast. Multiplanar CT image reconstructions of the cervical spine were also generated. COMPARISON:  CT head 09/24/2012, CT cervical spine 09/21/2012 FINDINGS: CT HEAD FINDINGS Brain: Generalized atrophy. Normal ventricular morphology. No midline shift or mass effect. Small vessel chronic ischemic changes of deep cerebral white matter. No intracranial hemorrhage, mass lesion, evidence of acute infarction, or extra-axial fluid collections. Vascular: Atherosclerotic calcification of internal carotid arteries and vertebral arteries at skullbase Skull: Intact.  Osseous demineralization. Sinuses/Orbits: Deformity with osseous thickening of the RIGHT maxillary sinus extending to the RIGHT ethmoid air cells, question related to prior trauma or chronic sinusitis. Remaining paranasal sinuses clear Other: N/A CT CERVICAL SPINE FINDINGS Alignment: Normal Skull base and vertebrae: Visualized skullbase intact. Multilevel facet degenerative changes. Old healed fracture of the posterior LEFT fourth rib. Vertebral body heights maintained without fracture or bone destruction. Soft tissues and spinal canal: Prevertebral soft tissues normal thickness. Scattered  normal and upper normal size cervical nodes. Disc levels: Mild disc space narrowing and endplate spur formation at C5-C6 and C6-C7. Upper chest: Lung apices clear. Other: N/A IMPRESSION: Atrophy with small vessel chronic ischemic changes of deep cerebral white matter. No acute intracranial  abnormalities. Chronic RIGHT maxillary sinusitis versus prior trauma of the RIGHT maxilla. Mild degenerative disc and facet disease changes cervical spine. No acute cervical spine abnormalities. Electronically Signed   By: Lavonia Dana M.D.   On: 10/18/2016 17:33   Dg Shoulder Left  Result Date: 10/18/2016 CLINICAL DATA:  Fall from car today.  Anterior left shoulder pain. EXAM: LEFT SHOULDER - 2+ VIEW COMPARISON:  Thoracic spine radiographs 08/11/2016. FINDINGS: The left shoulder is located. Degenerative changes are present at the left glenohumeral joint and AC joints. There is no acute fracture. The visualize clavicle is intact. Remote left-sided rib fractures are seen. IMPRESSION: 1. No acute abnormality. 2. Degenerative change. 3. Remote left-sided rib fractures. Electronically Signed   By: San Morelle M.D.   On: 10/18/2016 17:28    Procedures Procedures (including critical care time)  Medications Ordered in ED Medications  acetaminophen (TYLENOL) tablet 1,000 mg (1,000 mg Oral Given 10/18/16 1742)     Initial Impression / Assessment and Plan / ED Course  I have reviewed the triage vital signs and the nursing notes.  Pertinent labs & imaging results that were available during my care of the patient were reviewed by me and considered in my medical decision making (see chart for details).     83 yo F With a chief complaint of fall. Patient is unsure of the etiology of this. Will obtain basic labs in EKG. CT head and C-spine plain film of the left shoulder.  Images negative, labs reassuring, ecg with no significant change.  Place in sling.  PCP follow up for further workup for continued falls as  well as her being on eliquis.   6:29 PM:  I have discussed the diagnosis/risks/treatment options with the patient and family and believe the pt to be eligible for discharge home to follow-up with PCP. We also discussed returning to the ED immediately if new or worsening sx occur. We discussed the sx which are most concerning (e.g., sudden worsening pain, fever, inability to tolerate by mouth) that necessitate immediate return. Medications administered to the patient during their visit and any new prescriptions provided to the patient are listed below.  Medications given during this visit Medications  acetaminophen (TYLENOL) tablet 1,000 mg (1,000 mg Oral Given 10/18/16 1742)     The patient appears reasonably screen and/or stabilized for discharge and I doubt any other medical condition or other [facility] requiring further screening, evaluation, or treatment in the ED at this time prior to discharge.    Final Clinical Impressions(s) / ED Diagnoses   Final diagnoses:  Fall, initial encounter  Acute pain of left shoulder    New Prescriptions New Prescriptions   No medications on file     Deno Etienne, DO 10/18/16 1829

## 2016-10-22 DIAGNOSIS — K5901 Slow transit constipation: Secondary | ICD-10-CM | POA: Insufficient documentation

## 2016-10-29 ENCOUNTER — Ambulatory Visit: Payer: Medicare Other | Admitting: Internal Medicine

## 2016-11-23 ENCOUNTER — Ambulatory Visit: Payer: Medicare Other | Admitting: Internal Medicine

## 2016-12-10 DIAGNOSIS — M19012 Primary osteoarthritis, left shoulder: Secondary | ICD-10-CM | POA: Insufficient documentation

## 2016-12-23 NOTE — Progress Notes (Signed)
Cardiology Office Note   Date:  12/24/2016   ID:  [name], DOB 06-21-1924, MRN [id number]  PCP:  Chesley Noon, MD  Cardiologist:   Dorris Carnes, MD   F/U CAD   History of Present Illness: [name] is a 83 y.o. female with a history of CAD, s/p prior Taxus DES to mLAD in 2005 and subsequent cutting balloon angioplasty due to ISR in 2006. Last LHC in 2/12 demonstrated LAD 40%, patent stent, prox and mid RCA 30% and EF 60%. Echo in 5/13: mild LVH, EF 55%, mild BAE, mild AI. She also has a h/o AFib, DM2, HTN, HLP, anemia, GERD, IBS, DJD and multinodular goiter. She is on Eliquis for anticoagulation.  In 2017 [name] was done which showed no ischemia  Holter monitor in July showed afib with controlled rates  Echo showed normal LVEF Pt was last seen April 2017  Since seen she denies CP  Brreathing is stable  No palpitations  No dizzienss      Current Meds  Medication Sig  . alendronate (FOSAMAX) 70 MG tablet TAKE 1 TABLET BY MOUTH EVERY 7 DAYS ON TUESDAYS WITH A FULL GLASS OF WATER & ON AN EMPTY STOMACH  . amLODipine (NORVASC) 5 MG tablet Take 1 tablet by mouth at bedtime.  Marland Kitchen apixaban (ELIQUIS) 5 MG TABS tablet Take 1 tablet (5 mg total) by mouth 2 (two) times daily.  Marland Kitchen atorvastatin (LIPITOR) 20 MG tablet Take 20 mg by mouth daily.  . Calcium Carbonate-Vitamin D (CALCIUM 500/D PO) Take 1,000 mg by mouth daily.   . fluticasone (FLONASE) 50 MCG/ACT nasal spray Place 1 spray into both nostrils daily as needed for allergies.   Marland Kitchen gabapentin (NEURONTIN) 600 MG tablet Take 600 mg by mouth 2 (two) times daily.   Marland Kitchen glipiZIDE (GLUCOTROL XL) 5 MG 24 hr tablet Take 5 mg by mouth every evening.   . Multiple Vitamins-Minerals (PRESERVISION AREDS PO) Take 1 tablet by mouth 2 (two) times daily.  . nitroGLYCERIN (NITROSTAT) 0.4 MG SL tablet Place 0.4 mLs under the tongue every 5 (five) minutes as needed for chest pain (Max 3 doses).   . ondansetron (ZOFRAN) 8 MG  tablet Take 1 tablet (8 mg total) by mouth every 8 (eight) hours as needed for nausea or vomiting.  Marland Kitchen oxybutynin (DITROPAN) 5 MG tablet Take 5 mg by mouth daily.  [name] Sodium (SENNA S PO) Take by mouth. Take 2 tablets at bedtime for constipation  . traMADol (ULTRAM) 50 MG tablet Take 2 tablets (100 mg total) by mouth daily as needed for moderate pain.  . [DISCONTINUED] Melatonin 3 MG TABS Take 1 tablet (3 mg total) by mouth at bedtime.  . [DISCONTINUED] methocarbamol (ROBAXIN) 500 MG tablet Take 1 tablet (500 mg total) by mouth every 8 (eight) hours as needed for muscle spasms.     Allergies:   Amoxicillin   Past Medical History:  Diagnosis Date  . 1st degree AV block   . Acute renal failure ([place])    due to dehydration-Sept 2009; Normal creat 1.2 on fu 05/13/2009  . Allergic rhinitis   . Anemia    NOS chronic disease. Hgb 11.6gm% 04/14/2009  . Bradycardia   . CAD (coronary artery disease)    a. s/p DES mLAD 2005. b. Cutting balloon angioplasty for ISR 2006. c. LHC 08/2010 nonobstructive. d. Myoview 06/2012: small anteroapical infarct/mod inferolat wall infarct but no ischemia, EF 75%.  . Cancer ([place])    on  nose and had it removed  . Chronic anticoagulation   . CKD (chronic kidney disease), stage III   . DEMENTIA    slight  . Diabetes mellitus type II   . Diverticulitis of colon   . DM neuropathy, type II diabetes mellitus ([place])   . GERD (gastroesophageal reflux disease)   . HTN (hypertension)   . Hx of colonoscopy   . Hyperlipidemia   . IBS (irritable bowel syndrome)   . Multinodular goiter   . Obesity    BMI-37  . Orthostatic hypotension   . Osteoarthritis    lower back  . Osteoporosis   . Pneumonia   . PVC's (premature ventricular contractions)    a. seen on telemetry 02/2016.  Marland Kitchen RBBB   . Rheumatoid arthritis ([place])   . Spinal stenosis    djd lumbar    Past Surgical History:  Procedure Laterality Date  . ABDOMINAL HYSTERECTOMY    . bilateral  arthroscopic knee surgery    . carpel tunnel wrist rt    . CHOLECYSTECTOMY    . CORONARY ANGIOPLASTY     stent  . NASAL SINUS SURGERY     x2  . stent surgery       Social History:  The patient  reports that she has never smoked. She has never used smokeless tobacco. She reports that she does not drink alcohol or use drugs.   Family History:  The patient's family history includes Breast cancer in her sister; Heart attack in her daughter, father, and son; Heart disease in her brother.    ROS:  Please see the history of present illness. All other systems are reviewed and  Negative to the above problem except as noted.    PHYSICAL EXAM: VS:  BP 122/70   Pulse 93   Resp (!) 98   Ht 5\' 3"  (1.6 m)   Wt 77.8 kg (171 lb 9.6 oz)   BMI 30.40 kg/m   GEN: Well nourished, well developed, in no acute distress  HEENT: normal  Neck: no JVD, carotid bruits, or masses Cardiac: RRR; no murmurs, rubs, or gallops,no edema  Respiratory:  clear to auscultation bilaterally, normal work of breathing GI: soft, nontender, nondistended, + BS  No hepatomegaly  MS: no deformity Moving all extremities   Skin: warm and dry, no rash Neuro:  Strength and sensation are intact Psych: euthymic mood, full affect   EKG:  EKG is not ordered today.   Lipid Panel    Component Value Date/Time   CHOL 145 10/25/2015 0329   TRIG 84 10/25/2015 0329   HDL 52 10/25/2015 0329   CHOLHDL 2.8 10/25/2015 0329   VLDL 17 10/25/2015 0329   LDLCALC 76 10/25/2015 0329      Wt Readings from Last 3 Encounters:  12/24/16 77.8 kg (171 lb 9.6 oz)  03/30/16 80.5 kg (177 lb 6.4 oz)  03/30/16 81.6 kg (180 lb)      ASSESSMENT AND PLAN:  1  CAD  No symptoms of angina  Keep on same meds  2  HL  Check lipdis  Keep on statin  3  Atrial fib  Heart rate is regular  Keep on Eliquis  Check labs  F/Uin Feb 2019     Current medicines are reviewed at length with the patient today.  The patient does not have concerns  regarding medicines.  Signed, Dorris Carnes, MD  12/24/2016 11:27 AM    [place], [place]  [address] Phone: [phone]; Fax: [phone]

## 2016-12-24 ENCOUNTER — Encounter: Payer: Self-pay | Admitting: Internal Medicine

## 2016-12-24 ENCOUNTER — Ambulatory Visit (INDEPENDENT_AMBULATORY_CARE_PROVIDER_SITE_OTHER): Payer: Medicare Other | Admitting: Internal Medicine

## 2016-12-24 ENCOUNTER — Encounter (INDEPENDENT_AMBULATORY_CARE_PROVIDER_SITE_OTHER): Payer: Self-pay

## 2016-12-24 VITALS — BP 122/70 | HR 93 | Resp 98 | Ht 63.0 in | Wt 171.6 lb

## 2016-12-24 DIAGNOSIS — I10 Essential (primary) hypertension: Secondary | ICD-10-CM | POA: Diagnosis not present

## 2016-12-24 DIAGNOSIS — I2510 Atherosclerotic heart disease of native coronary artery without angina pectoris: Secondary | ICD-10-CM | POA: Diagnosis not present

## 2016-12-24 DIAGNOSIS — I480 Paroxysmal atrial fibrillation: Secondary | ICD-10-CM | POA: Diagnosis not present

## 2016-12-24 NOTE — Patient Instructions (Signed)
Medication Instructions:  Your physician recommends that you continue on your current medications as directed. Please refer to the Current Medication list given to you today.   Labwork: TODAY - CBC, BMET, Lipid   Testing/Procedures: None Ordered   Follow-Up: Your physician wants you to follow-up in: February 2019 with Dr. Harrington Challenger.  You will receive a reminder letter in the mail two months in advance. If you don't receive a letter, please call our office to schedule the follow-up appointment.   If you need a refill on your cardiac medications before your next appointment, please call your pharmacy.   Thank you for choosing [facility]! Christen Bame, RN [phone]

## 2016-12-25 LAB — CBC WITH DIFFERENTIAL/PLATELET
BASOS: 0 %
Basophils Absolute: 0 10*3/uL (ref 0.0–0.2)
EOS (ABSOLUTE): 0.1 10*3/uL (ref 0.0–0.4)
EOS: 1 %
HEMATOCRIT: 42.4 % (ref 34.0–46.6)
HEMOGLOBIN: 14.3 g/dL (ref 11.1–15.9)
IMMATURE GRANS (ABS): 0 10*3/uL (ref 0.0–0.1)
IMMATURE GRANULOCYTES: 0 %
Lymphocytes Absolute: 1.7 10*3/uL (ref 0.7–3.1)
Lymphs: 21 %
MCH: 31.4 pg (ref 26.6–33.0)
MCHC: 33.7 g/dL (ref 31.5–35.7)
MCV: 93 fL (ref 79–97)
MONOCYTES: 8 %
Monocytes Absolute: 0.6 10*3/uL (ref 0.1–0.9)
NEUTROS ABS: 5.6 10*3/uL (ref 1.4–7.0)
Neutrophils: 70 %
PLATELETS: 249 10*3/uL (ref 150–379)
RBC: 4.55 x10E6/uL (ref 3.77–5.28)
RDW: 14.3 % (ref 12.3–15.4)
WBC: 8 10*3/uL (ref 3.4–10.8)

## 2016-12-25 LAB — BASIC METABOLIC PANEL
BUN / CREAT RATIO: 20 (ref 12–28)
BUN: 19 mg/dL (ref 10–36)
CO2: 28 mmol/L (ref 18–29)
CREATININE: 0.97 mg/dL (ref 0.57–1.00)
Calcium: 10.1 mg/dL (ref 8.7–10.3)
Chloride: 99 mmol/L (ref 96–106)
GFR calc Af Amer: 59 mL/min/{1.73_m2} — ABNORMAL LOW (ref >59)
GFR calc non Af Amer: 52 mL/min/{1.73_m2} — ABNORMAL LOW (ref >59)
Glucose: 103 mg/dL — ABNORMAL HIGH (ref 65–99)
POTASSIUM: 4.7 mmol/L (ref 3.5–5.2)
SODIUM: 142 mmol/L (ref 134–144)

## 2016-12-25 LAB — LIPID PANEL
CHOL/HDL RATIO: 2 ratio (ref 0.0–4.4)
Cholesterol, Total: 159 mg/dL (ref 100–199)
HDL: 79 mg/dL (ref >39)
LDL CALC: 65 mg/dL (ref 0–99)
Triglycerides: 74 mg/dL (ref 0–149)
VLDL CHOLESTEROL CAL: 15 mg/dL (ref 5–40)

## 2017-01-30 DIAGNOSIS — M170 Bilateral primary osteoarthritis of knee: Secondary | ICD-10-CM | POA: Insufficient documentation

## 2017-02-17 ENCOUNTER — Observation Stay (HOSPITAL_COMMUNITY)
Admission: EM | Admit: 2017-02-17 | Discharge: 2017-02-19 | Disposition: A | Discharge status: Home / Self Care | Payer: Medicare Other | Attending: Internal Medicine | Admitting: Internal Medicine

## 2017-02-17 ENCOUNTER — Emergency Department (HOSPITAL_COMMUNITY): Payer: Medicare Other

## 2017-02-17 ENCOUNTER — Encounter (HOSPITAL_COMMUNITY): Payer: Self-pay

## 2017-02-17 DIAGNOSIS — M069 Rheumatoid arthritis, unspecified: Secondary | ICD-10-CM | POA: Diagnosis not present

## 2017-02-17 DIAGNOSIS — Z79899 Other long term (current) drug therapy: Secondary | ICD-10-CM | POA: Insufficient documentation

## 2017-02-17 DIAGNOSIS — I10 Essential (primary) hypertension: Secondary | ICD-10-CM

## 2017-02-17 DIAGNOSIS — Z880 Allergy status to penicillin: Secondary | ICD-10-CM | POA: Diagnosis not present

## 2017-02-17 DIAGNOSIS — E669 Obesity, unspecified: Secondary | ICD-10-CM | POA: Insufficient documentation

## 2017-02-17 DIAGNOSIS — Z823 Family history of stroke: Secondary | ICD-10-CM | POA: Diagnosis not present

## 2017-02-17 DIAGNOSIS — I482 Chronic atrial fibrillation: Secondary | ICD-10-CM

## 2017-02-17 DIAGNOSIS — G629 Polyneuropathy, unspecified: Secondary | ICD-10-CM

## 2017-02-17 DIAGNOSIS — E876 Hypokalemia: Secondary | ICD-10-CM | POA: Diagnosis not present

## 2017-02-17 DIAGNOSIS — Z800 Family history of malignant neoplasm of digestive organs: Secondary | ICD-10-CM | POA: Diagnosis not present

## 2017-02-17 DIAGNOSIS — Z7984 Long term (current) use of oral hypoglycemic drugs: Secondary | ICD-10-CM | POA: Insufficient documentation

## 2017-02-17 DIAGNOSIS — Z6837 Body mass index (BMI) 37.0-37.9, adult: Secondary | ICD-10-CM | POA: Insufficient documentation

## 2017-02-17 DIAGNOSIS — Z803 Family history of malignant neoplasm of breast: Secondary | ICD-10-CM | POA: Diagnosis not present

## 2017-02-17 DIAGNOSIS — I2510 Atherosclerotic heart disease of native coronary artery without angina pectoris: Secondary | ICD-10-CM | POA: Diagnosis not present

## 2017-02-17 DIAGNOSIS — N183 Chronic kidney disease, stage 3 (moderate): Secondary | ICD-10-CM | POA: Diagnosis not present

## 2017-02-17 DIAGNOSIS — I129 Hypertensive chronic kidney disease with stage 1 through stage 4 chronic kidney disease, or unspecified chronic kidney disease: Secondary | ICD-10-CM | POA: Diagnosis not present

## 2017-02-17 DIAGNOSIS — Z7901 Long term (current) use of anticoagulants: Secondary | ICD-10-CM | POA: Diagnosis not present

## 2017-02-17 DIAGNOSIS — Z8249 Family history of ischemic heart disease and other diseases of the circulatory system: Secondary | ICD-10-CM | POA: Insufficient documentation

## 2017-02-17 DIAGNOSIS — E1122 Type 2 diabetes mellitus with diabetic chronic kidney disease: Secondary | ICD-10-CM | POA: Diagnosis not present

## 2017-02-17 DIAGNOSIS — E785 Hyperlipidemia, unspecified: Secondary | ICD-10-CM | POA: Insufficient documentation

## 2017-02-17 DIAGNOSIS — E119 Type 2 diabetes mellitus without complications: Secondary | ICD-10-CM

## 2017-02-17 DIAGNOSIS — I4891 Unspecified atrial fibrillation: Secondary | ICD-10-CM | POA: Diagnosis present

## 2017-02-17 DIAGNOSIS — R55 Syncope and collapse: Principal | ICD-10-CM | POA: Insufficient documentation

## 2017-02-17 LAB — BASIC METABOLIC PANEL WITH GFR
Anion gap: 15 (ref 5–15)
BUN: 16 mg/dL (ref 6–20)
CO2: 21 mmol/L — ABNORMAL LOW (ref 22–32)
Calcium: 9.3 mg/dL (ref 8.9–10.3)
Chloride: 103 mmol/L (ref 101–111)
Creatinine, Ser: 0.95 mg/dL (ref 0.44–1.00)
GFR calc Af Amer: 59 mL/min — ABNORMAL LOW (ref >60)
GFR calc non Af Amer: 51 mL/min — ABNORMAL LOW (ref >60)
Glucose, Bld: 176 mg/dL — ABNORMAL HIGH (ref 65–99)
Potassium: 3.7 mmol/L (ref 3.5–5.1)
Sodium: 139 mmol/L (ref 135–145)

## 2017-02-17 LAB — URINALYSIS, ROUTINE W REFLEX MICROSCOPIC
Bilirubin Urine: NEGATIVE
Glucose, UA: NEGATIVE mg/dL
Hgb urine dipstick: NEGATIVE
KETONES UR: 20 mg/dL — AB
Leukocytes, UA: NEGATIVE
NITRITE: NEGATIVE
PH: 6 (ref 5.0–8.0)
Protein, ur: NEGATIVE mg/dL
SPECIFIC GRAVITY, URINE: 1.009 (ref 1.005–1.030)

## 2017-02-17 LAB — CBC
HCT: 41.4 % (ref 36.0–46.0)
Hemoglobin: 14.3 g/dL (ref 12.0–15.0)
MCH: 30.8 pg (ref 26.0–34.0)
MCHC: 34.5 g/dL (ref 30.0–36.0)
MCV: 89.2 fL (ref 78.0–100.0)
Platelets: 215 K/uL (ref 150–400)
RBC: 4.64 MIL/uL (ref 3.87–5.11)
RDW: 13.3 % (ref 11.5–15.5)
WBC: 6.7 K/uL (ref 4.0–10.5)

## 2017-02-17 LAB — HEPATIC FUNCTION PANEL
ALT: 15 U/L (ref 14–54)
AST: 32 U/L (ref 15–41)
Albumin: 3.8 g/dL (ref 3.5–5.0)
Alkaline Phosphatase: 61 U/L (ref 38–126)
BILIRUBIN INDIRECT: 0.9 mg/dL (ref 0.3–0.9)
BILIRUBIN TOTAL: 1.2 mg/dL (ref 0.3–1.2)
Bilirubin, Direct: 0.3 mg/dL (ref 0.1–0.5)
Total Protein: 7.1 g/dL (ref 6.5–8.1)

## 2017-02-17 LAB — GLUCOSE, CAPILLARY: Glucose-Capillary: 138 mg/dL — ABNORMAL HIGH (ref 65–99)

## 2017-02-17 LAB — PROTIME-INR
INR: 1.1
Prothrombin Time: 14.2 s (ref 11.4–15.2)

## 2017-02-17 LAB — TROPONIN I: Troponin I: <0.03 ng/mL (ref <0.03)

## 2017-02-17 MED ORDER — SODIUM CHLORIDE 0.9% FLUSH
3.0000 mL | Freq: Two times a day (BID) | INTRAVENOUS | Status: DC
  Administered 2017-02-18 – 2017-02-19 (×2): 3 mL via INTRAVENOUS

## 2017-02-17 MED ORDER — ONDANSETRON HCL 4 MG/2ML IJ SOLN
4.0000 mg | Freq: Once | INTRAMUSCULAR | Status: AC
  Administered 2017-02-17: 4 mg via INTRAVENOUS
  Filled 2017-02-17: qty 2

## 2017-02-17 MED ORDER — SODIUM CHLORIDE 0.9% FLUSH: 3.0000 mL | INTRAVENOUS | Status: DC | PRN

## 2017-02-17 MED ORDER — ONDANSETRON HCL 4 MG/2ML IJ SOLN: 4.0000 mg | Freq: Four times a day (QID) | INTRAMUSCULAR | Status: DC | PRN

## 2017-02-17 MED ORDER — OXYBUTYNIN CHLORIDE 5 MG PO TABS
5.0000 mg | ORAL_TABLET | Freq: Every day | ORAL | Status: DC
  Administered 2017-02-17 – 2017-02-19 (×3): 5 mg via ORAL
  Filled 2017-02-17 (×3): qty 1

## 2017-02-17 MED ORDER — ACETAMINOPHEN 650 MG RE SUPP: 650.0000 mg | Freq: Four times a day (QID) | RECTAL | Status: DC | PRN

## 2017-02-17 MED ORDER — SODIUM CHLORIDE 0.9% FLUSH
3.0000 mL | Freq: Two times a day (BID) | INTRAVENOUS | Status: DC
  Administered 2017-02-17 – 2017-02-19 (×3): 3 mL via INTRAVENOUS

## 2017-02-17 MED ORDER — ACETAMINOPHEN 325 MG PO TABS: 650.0000 mg | ORAL_TABLET | Freq: Four times a day (QID) | ORAL | Status: DC | PRN

## 2017-02-17 MED ORDER — TRAMADOL HCL 50 MG PO TABS: 50.0000 mg | ORAL_TABLET | Freq: Two times a day (BID) | ORAL | Status: DC | PRN

## 2017-02-17 MED ORDER — GABAPENTIN 600 MG PO TABS
300.0000 mg | ORAL_TABLET | Freq: Two times a day (BID) | ORAL | Status: DC
  Administered 2017-02-17 – 2017-02-19 (×4): 300 mg via ORAL
  Filled 2017-02-17 (×4): qty 1

## 2017-02-17 MED ORDER — AMLODIPINE BESYLATE 5 MG PO TABS
5.0000 mg | ORAL_TABLET | Freq: Every day | ORAL | Status: DC
  Administered 2017-02-18: 5 mg via ORAL
  Filled 2017-02-17: qty 1

## 2017-02-17 MED ORDER — ONDANSETRON HCL 4 MG PO TABS: 4.0000 mg | ORAL_TABLET | Freq: Four times a day (QID) | ORAL | Status: DC | PRN

## 2017-02-17 MED ORDER — ATORVASTATIN CALCIUM 20 MG PO TABS
20.0000 mg | ORAL_TABLET | Freq: Every day | ORAL | Status: DC
  Administered 2017-02-17 – 2017-02-19 (×3): 20 mg via ORAL
  Filled 2017-02-17 (×3): qty 1

## 2017-02-17 MED ORDER — SODIUM CHLORIDE 0.9 % IV BOLUS (SEPSIS)
1000.0000 mL | Freq: Once | INTRAVENOUS | Status: AC
  Administered 2017-02-17: 1000 mL via INTRAVENOUS

## 2017-02-17 MED ORDER — INSULIN ASPART 100 UNIT/ML ~~LOC~~ SOLN
0.0000 [IU] | Freq: Three times a day (TID) | SUBCUTANEOUS | Status: DC
  Administered 2017-02-18: 2 [IU] via SUBCUTANEOUS
  Administered 2017-02-19: 1 [IU] via SUBCUTANEOUS

## 2017-02-17 MED ORDER — SENNOSIDES-DOCUSATE SODIUM 8.6-50 MG PO TABS
2.0000 | ORAL_TABLET | Freq: Every day | ORAL | Status: DC
  Administered 2017-02-17 – 2017-02-18 (×2): 2 via ORAL
  Filled 2017-02-17 (×2): qty 2

## 2017-02-17 MED ORDER — ZOLPIDEM TARTRATE 5 MG PO TABS
5.0000 mg | ORAL_TABLET | Freq: Once | ORAL | Status: AC
  Administered 2017-02-17: 5 mg via ORAL
  Filled 2017-02-17: qty 1

## 2017-02-17 MED ORDER — SODIUM CHLORIDE 0.9 % IV SOLN: 250.0000 mL | INTRAVENOUS | Status: DC | PRN

## 2017-02-17 MED ORDER — APIXABAN 5 MG PO TABS
5.0000 mg | ORAL_TABLET | Freq: Two times a day (BID) | ORAL | Status: DC
  Administered 2017-02-17 – 2017-02-19 (×4): 5 mg via ORAL
  Filled 2017-02-17 (×4): qty 1

## 2017-02-17 NOTE — H&P (Signed)
[facility] History and Physical  [name] EXH:[id number] DOB: [birth year] DOA: 02/17/2017   PCP: Chesley Noon, MD  Specialists: Dr. Harrington Challenger is her cardiologist  Chief Complaint: Passed out  HPI: [name] is a 83 y.o. female with a past medical history of hypertension, coronary artery disease status post stent in the remote past, history of atrial fibrillation on anticoagulation, history of type 2 diabetes on oral medications, previous history of postural hypotension, previous history of syncope and pause. Seen by electrophysiologist last year and syncope was thought to be vagally mediated. Not considered a candidate for pacemaker at that time. Patient is not a very good historian. She doesn't remember the events of this morning. She tells me that she was feeling quite well yesterday. This morning when she woke up she was feeling well. She went to her neighbor's house apparently to request them to get medications from pharmacy. But this information may not be accurate. She came back home and apparently passed out. Doesn't remember the events of this episode. She denies any chest pain, shortness of breath or lightheadedness prior to this episode. She does use a walker to ambulate and did use the walker this morning. She did have a urinary incontinence associated with the syncope. No tongue bites. No headaches. No vision disturbances. No seizure type activity that she can recall. No weakness on any one side of her body. No tingling or numbness. Denies any dysuria per se, but has to go to the bathroom frequently which is chronic for her. Denies any diarrhea. No fever, no chills.  In the emergency department, patient was noted to be in atrial fibrillation with normal rate. Workup was unremarkable. Due to syncope and her cardiac history she merits observation in the hospital.  Home Medications: Prior to Admission medications   Medication Sig Start Date End Date Taking?  Authorizing Provider  alendronate (FOSAMAX) 70 MG tablet TAKE 1 TABLET BY MOUTH EVERY 7 DAYS ON TUESDAYS WITH A FULL GLASS OF WATER & ON AN EMPTY STOMACH 02/22/16   Fay Records, MD  amLODipine (NORVASC) 5 MG tablet Take 1 tablet by mouth at bedtime. 03/20/16   [provider]  apixaban (ELIQUIS) 5 MG TABS tablet Take 1 tablet (5 mg total) by mouth 2 (two) times daily. 01/31/15   Richardson Dopp T, PA-C  atorvastatin (LIPITOR) 20 MG tablet Take 20 mg by mouth daily.    [provider]  Calcium Carbonate-Vitamin D (CALCIUM 500/D PO) Take 1,000 mg by mouth daily.     [provider]  fluticasone (FLONASE) 50 MCG/ACT nasal spray Place 1 spray into both nostrils daily as needed for allergies.  01/17/16   [provider]  gabapentin (NEURONTIN) 600 MG tablet Take 600 mg by mouth 2 (two) times daily.  10/25/13   [provider]  glipiZIDE (GLUCOTROL XL) 5 MG 24 hr tablet Take 5 mg by mouth every evening.     [provider]  Multiple Vitamins-Minerals (PRESERVISION AREDS PO) Take 1 tablet by mouth 2 (two) times daily.    [provider]  nitroGLYCERIN (NITROSTAT) 0.4 MG SL tablet Place 0.4 mLs under the tongue every 5 (five) minutes as needed for chest pain (Max 3 doses).  08/16/14   [provider]  ondansetron (ZOFRAN) 8 MG tablet Take 1 tablet (8 mg total) by mouth every 8 (eight) hours as needed for nausea or vomiting. 12/30/15   Daleen Bo, MD  oxybutynin (DITROPAN) 5 MG tablet Take 5  mg by mouth daily.    [provider]  Sennosides-Docusate Sodium (SENNA S PO) Take by mouth. Take 2 tablets at bedtime for constipation    [provider]  traMADol (ULTRAM) 50 MG tablet Take 2 tablets (100 mg total) by mouth daily as needed for moderate pain. 03/13/16   Reyne Dumas, MD    Allergies:  Allergies  Allergen Reactions  . Amoxicillin Other (See Comments)    UNKNOWN    Past Medical History: Past Medical History:    Diagnosis Date  . 1st degree AV block   . Acute renal failure ([place])    due to dehydration-Sept 2009; Normal creat 1.2 on fu 05/13/2009  . Allergic rhinitis   . Anemia    NOS chronic disease. Hgb 11.6gm% 04/14/2009  . Bradycardia   . CAD (coronary artery disease)    a. s/p DES mLAD 2005. b. Cutting balloon angioplasty for ISR 2006. c. LHC 08/2010 nonobstructive. d. Myoview 06/2012: small anteroapical infarct/mod inferolat wall infarct but no ischemia, EF 75%.  . Cancer ([place])    on nose and had it removed  . Chronic anticoagulation   . CKD (chronic kidney disease), stage III   . DEMENTIA    slight  . Diabetes mellitus type II   . Diverticulitis of colon   . DM neuropathy, type II diabetes mellitus ([place])   . GERD (gastroesophageal reflux disease)   . HTN (hypertension)   . Hx of colonoscopy   . Hyperlipidemia   . IBS (irritable bowel syndrome)   . Multinodular goiter   . Obesity    BMI-37  . Orthostatic hypotension   . Osteoarthritis    lower back  . Osteoporosis   . Pneumonia   . PVC's (premature ventricular contractions)    a. seen on telemetry 02/2016.  Marland Kitchen RBBB   . Rheumatoid arthritis ([place])   . Spinal stenosis    djd lumbar    Past Surgical History:  Procedure Laterality Date  . ABDOMINAL HYSTERECTOMY    . bilateral arthroscopic knee surgery    . carpel tunnel wrist rt    . CHOLECYSTECTOMY    . CORONARY ANGIOPLASTY     stent  . NASAL SINUS SURGERY     x2  . stent surgery      Social History: Patient lives by herself in her on house. Uses a walker to ambulate. No history of smoking, alcohol use or illicit drug use.  Family History:  Family History  Problem Relation Age of Onset  . Heart attack Father   . Breast cancer Sister   . Heart disease Brother   . Heart attack Son   . Heart attack Daughter   . Colon cancer Neg Hx   . Stroke Neg Hx      Review of Systems - History obtained from the patient General ROS: negative Psychological ROS:  negative Ophthalmic ROS: poor vision in right eye ENT ROS: negative Allergy and Immunology ROS: negative Hematological and Lymphatic ROS: negative Endocrine ROS: negative Respiratory ROS: no cough, shortness of breath, or wheezing Cardiovascular ROS: no chest pain or dyspnea on exertion Gastrointestinal ROS: no abdominal pain, change in bowel habits, or black or bloody stools Genito-Urinary ROS: no dysuria, trouble voiding, or hematuria Musculoskeletal ROS: negative Neurological ROS: no TIA or stroke symptoms. Some memory issues over past many months. Dermatological ROS: negative  Physical Examination  Vitals:   02/17/17 1530 02/17/17 1600 02/17/17 1630 02/17/17 1700  BP: (!) 149/74 105/90 (!) 142/91 Marland Kitchen)  152/69  Pulse: 86 81 83 79  Resp: 14 19 14  (!) 24  Temp:      SpO2: 97% 100% 99% 95%  Weight:      Height:        BP (!) 152/69   Pulse 79   Temp 97.7 F (36.5 C)   Resp (!) 24   Ht 5\' 2"  (1.575 m)   Wt 77.1 kg (170 lb)   SpO2 95%   BMI 31.09 kg/m   General appearance: alert, cooperative, appears stated age and no distress Head: Normocephalic, without obvious abnormality, atraumatic Eyes: conjunctivae/corneas clear. PERRL, EOM's intact.  Throat: lips, mucosa, and tongue normal; teeth and gums normal Neck: no adenopathy, no carotid bruit, no JVD, supple, symmetrical, trachea midline and thyroid not enlarged, symmetric, no tenderness/mass/nodules Back: Mildly tender in the posterior right rib cage. No bruising noted. Resp: clear to auscultation bilaterally Cardio: regular rate and rhythm, S1, S2 normal, no murmur, click, rub or gallop GI: soft, non-tender; bowel sounds normal; no masses,  no organomegaly Extremities: extremities normal, atraumatic, no cyanosis or edema Pulses: 2+ and symmetric Skin: Skin color, texture, turgor normal. No rashes or lesions Lymph nodes: Cervical, supraclavicular, and axillary nodes normal. Neurologic: Awake and alert. Oriented to  person, place, year. No facial asymmetry. Tongue is midline. No pronator drift. Strength is equal bilateral upper and lower extremities.   Labs on Admission: I have personally reviewed following labs and imaging studies  CBC:  Recent Labs Lab 02/17/17 1441  WBC 6.7  HGB 14.3  HCT 41.4  MCV 89.2  PLT 782   Basic Metabolic Panel:  Recent Labs Lab 02/17/17 1441  NA 139  K 3.7  CL 103  CO2 21*  GLUCOSE 176*  BUN 16  CREATININE 0.95  CALCIUM 9.3   GFR: Estimated Creatinine Clearance: 37.8 mL/min (by C-G formula based on SCr of 0.95 mg/dL).  Liver Function Tests:  Recent Labs Lab 02/17/17 1441  AST 32  ALT 15  ALKPHOS 61  BILITOT 1.2  PROT 7.1  ALBUMIN 3.8   Coagulation Profile:  Recent Labs Lab 02/17/17 1441  INR 1.10     Radiological Exams on Admission: Dg Chest 2 View  Result Date: 02/17/2017 CLINICAL DATA:  Chest pain after fall at home today. EXAM: CHEST  2 VIEW COMPARISON:  Radiographs of March 10, 2016. FINDINGS: The heart size and mediastinal contours are within normal limits. Both lungs are clear. Atherosclerosis of thoracic aorta is noted. No pneumothorax or pleural effusion is noted. The visualized skeletal structures are unremarkable. IMPRESSION: No active cardiopulmonary disease.  Aortic atherosclerosis. Electronically Signed   By: Marijo Conception, M.D.   On: 02/17/2017 17:32   Ct Head Wo Contrast  Result Date: 02/17/2017 CLINICAL DATA:  Syncope and fall today with head injury. Suspected loss of consciousness. History of atrial fibrillation on anticoagulation. Initial encounter. EXAM: CT HEAD WITHOUT CONTRAST CT CERVICAL SPINE WITHOUT CONTRAST TECHNIQUE: Multidetector CT imaging of the head and cervical spine was performed following the standard protocol without intravenous contrast. Multiplanar CT image reconstructions of the cervical spine were also generated. COMPARISON:  10/18/2016 FINDINGS: CT HEAD FINDINGS Brain: There is no evidence of acute  infarct, intracranial hemorrhage, mass, midline shift, or extra-axial fluid collection. Extensive cerebral white matter hypodensities are unchanged and nonspecific but compatible with chronic small vessel ischemic disease. Vascular: Calcified atherosclerosis at the skullbase. No hyperdense vessel. Skull: No fracture or focal osseous lesion. Sinuses/Orbits: Prior right-sided sinus surgery. Chronic right maxillary sinusitis.  Clear mastoid air cells. Bilateral cataract extraction. Other: None. CT CERVICAL SPINE FINDINGS Alignment: Trace anterolisthesis of C7 on T1, likely degenerative and facet mediated. Skull base and vertebrae: No acute fracture or destructive osseous process. Soft tissues and spinal canal: No prevertebral fluid or swelling. No visible canal hematoma. Disc levels: Moderate anterior vertebral spurring in the lower cervical spine. Overall preserved disc space heights. Moderately advanced multilevel cervical and upper thoracic facet arthrosis. Upper chest: Clear lung apices. Similar appearance of small partially calcified right thyroid nodules. Other: None. IMPRESSION: 1. No evidence of acute intracranial abnormality. 2. Extensive chronic small vessel ischemic disease. 3. No acute fracture or traumatic subluxation identified in the cervical spine. Electronically Signed   By: Logan Bores M.D.   On: 02/17/2017 15:31   Ct Cervical Spine Wo Contrast  Result Date: 02/17/2017 CLINICAL DATA:  Syncope and fall today with head injury. Suspected loss of consciousness. History of atrial fibrillation on anticoagulation. Initial encounter. EXAM: CT HEAD WITHOUT CONTRAST CT CERVICAL SPINE WITHOUT CONTRAST TECHNIQUE: Multidetector CT imaging of the head and cervical spine was performed following the standard protocol without intravenous contrast. Multiplanar CT image reconstructions of the cervical spine were also generated. COMPARISON:  10/18/2016 FINDINGS: CT HEAD FINDINGS Brain: There is no evidence of acute  infarct, intracranial hemorrhage, mass, midline shift, or extra-axial fluid collection. Extensive cerebral white matter hypodensities are unchanged and nonspecific but compatible with chronic small vessel ischemic disease. Vascular: Calcified atherosclerosis at the skullbase. No hyperdense vessel. Skull: No fracture or focal osseous lesion. Sinuses/Orbits: Prior right-sided sinus surgery. Chronic right maxillary sinusitis. Clear mastoid air cells. Bilateral cataract extraction. Other: None. CT CERVICAL SPINE FINDINGS Alignment: Trace anterolisthesis of C7 on T1, likely degenerative and facet mediated. Skull base and vertebrae: No acute fracture or destructive osseous process. Soft tissues and spinal canal: No prevertebral fluid or swelling. No visible canal hematoma. Disc levels: Moderate anterior vertebral spurring in the lower cervical spine. Overall preserved disc space heights. Moderately advanced multilevel cervical and upper thoracic facet arthrosis. Upper chest: Clear lung apices. Similar appearance of small partially calcified right thyroid nodules. Other: None. IMPRESSION: 1. No evidence of acute intracranial abnormality. 2. Extensive chronic small vessel ischemic disease. 3. No acute fracture or traumatic subluxation identified in the cervical spine. Electronically Signed   By: Logan Bores M.D.   On: 02/17/2017 15:31    My interpretation of Electrocardiogram: Atrial fibrillation in the 90s. Right bundle branch block. Other intervals are normal. No concerning ST or T-wave changes. Similar to previous EKG.   Problem List  Active Problems:   HLD (hyperlipidemia)   Neuropathy (HCC)   Coronary artery disease   Atrial fibrillation, permanent   Diabetes mellitus type II, non insulin dependent (HCC)   HTN (hypertension)   Chronic anticoagulation for A fib, CHA2DS2VASc = 6   Syncope   Assessment: This is a 83 year old Caucasian female with past history as stated earlier, who presents after what  appears to be a syncopal episode at home. The exact circumstances of this episode is not known. The differential diagnosis is broad and includes arrhythmia, vasovagal episode, seizures. Unlikely to be venous thromboembolism as the patient is already anticoagulated.  Plan: #1 syncope: Patient will be observed in the hospital. She'll be placed on telemetry. She had a similar episode last year and was hospitalized at that time as well. She was seen by cardiology and electrophysiology due to a pause and significant bradycardia that was identified. She was not considered a candidate for  pacemaker. Her symptoms were thought to be vagally mediated and related to the urge to urinate. Her beta blocker was discontinued at that time. Patient did mention urinary incontinence. Her UA does not suggest infection. She does not have any tongue bite marks. We will also get EEG. Echocardiogram. PT and OT evaluation morning. Orthostatic vital signs.  #2 history of coronary artery disease: Stable. Patient denies any chest pain. EKG does not show any ischemic changes.  #3 history of permanent atrial fibrillation: Chads2vasc score is 6. Patient is anticoagulated with Eliquis, which will be continued.  #4 history of essential hypertension: Monitor blood pressures closely. Continue amlodipine.  #5 history of diabetes mellitus type 2 on oral medications: Sliding scale insulin coverage. Hold her oral medication for now.  #6 Neuropathy, likely due to diabetes: Continue gabapentin   DVT Prophylaxis: She is fully anticoagulated Code Status: Full code Family Communication: Discussed with patient and her granddaughter  Consults called: None   Severity of Illness: The appropriate patient status for this patient is OBSERVATION. Observation status is judged to be reasonable and necessary in order to provide the required intensity of service to ensure the patient's safety. The patient's presenting symptoms, physical exam  findings, and initial radiographic and laboratory data in the context of their medical condition is felt to place them at decreased risk for further clinical deterioration. Furthermore, it is anticipated that the patient will be medically stable for discharge from the hospital within 2 midnights of admission. The following factors support the patient status of observation.   " The patient's presenting symptoms include syncope. " The physical exam findings include atrial fibrillation. " The initial radiographic and laboratory data are unremarkable.  Further management decisions will depend on results of further testing and patient's response to treatment.   [facility]  [facility] Pager [phone]  If 7PM-7AM, please contact night-coverage [email or web address] Password [facility]  02/17/2017, 6:04 PM

## 2017-02-17 NOTE — ED Notes (Signed)
Contacted lab about delay in lab results. No answer. Will call back.

## 2017-02-17 NOTE — ED Provider Notes (Signed)
[facility] DEPT Provider Note   CSN: [id number] Arrival date & time: 02/17/17  1432     History   Chief Complaint Chief Complaint  Patient presents with  . Loss of Consciousness    HPI [name] is a 83 y.o. female.  HPI  Per EMS and patient who is slightly altered patient was not feeling well like she was going to vomit she went to Dr. Redmond Pulling and on the way back she syncopized. The neighbor thinks that she hit her head but did not witness it. Patient is alert oriented to person and place but not time. Denies any pain prior to this episode but does not remember for sure. Since then she does have some right back pain but nothing midline. No abdominal symptoms. She states she has had some burning when she urinates recently but no other symptoms.  Past Medical History:  Diagnosis Date  . 1st degree AV block   . Acute renal failure ([place])    due to dehydration-Sept 2009; Normal creat 1.2 on fu 05/13/2009  . Allergic rhinitis   . Anemia    NOS chronic disease. Hgb 11.6gm% 04/14/2009  . Bradycardia   . CAD (coronary artery disease)    a. s/p DES mLAD 2005. b. Cutting balloon angioplasty for ISR 2006. c. LHC 08/2010 nonobstructive. d. Myoview 06/2012: small anteroapical infarct/mod inferolat wall infarct but no ischemia, EF 75%.  . Cancer ([place])    on nose and had it removed  . Chronic anticoagulation   . CKD (chronic kidney disease), stage III   . DEMENTIA    slight  . Diabetes mellitus type II   . Diverticulitis of colon   . DM neuropathy, type II diabetes mellitus ([place])   . GERD (gastroesophageal reflux disease)   . HTN (hypertension)   . Hx of colonoscopy   . Hyperlipidemia   . IBS (irritable bowel syndrome)   . Multinodular goiter   . Obesity    BMI-37  . Orthostatic hypotension   . Osteoarthritis    lower back  . Osteoporosis   . Pneumonia   . PVC's (premature ventricular contractions)    a. seen on telemetry 02/2016.  Marland Kitchen RBBB   . Rheumatoid arthritis  ([place])   . Spinal stenosis    djd lumbar    Patient Active Problem List   Diagnosis Date Noted  . Syncope 02/17/2017  . PVC's (premature ventricular contractions) 03/07/2016  . Depression 03/06/2016  . Chest pain at rest 03/06/2016  . Unstable angina ([place]) 10/24/2015  . Chronic anticoagulation for A fib, CHA2DS2VASc = 6 12/18/2014  . Chest pain, negative MI, negative Nuc, may be GI 12/17/2014  . Diabetes mellitus type II, non insulin dependent ([place])   . HTN (hypertension)   . Tachycardia 08/07/2011  . Atrial fibrillation, permanent 06/25/2011  . Near Syncope 06/06/2011  . Coronary artery disease 04/05/2011  . Bradycardia 12/09/2009  . ACUTE BRONCHITIS 06/17/2009  . COPD, moderate ([place]) 06/17/2009  . PULMONARY NODULE, RIGHT UPPER LOBE 06/07/2009  . SHORTNESS OF BREATH (SOB) 06/07/2009  . POSTURAL HYPOTENSION 05/23/2009  . DEHYDRATION 05/13/2009  . ABDOMINAL PAIN, LOWER 01/21/2009  . HOARSENESS, CHRONIC 12/03/2007  . GOITER 09/30/2007  . DIABETIC PERIPHERAL NEUROPATHY 09/30/2007  . OBESITY 09/30/2007  . HIATAL HERNIA 09/30/2007  . OSTEOARTHRITIS 09/30/2007  . SPINAL STENOSIS 09/30/2007  . PERIPHERAL EDEMA 09/30/2007  . HLD (hyperlipidemia) 02/12/2007  . ANEMIA-NOS 02/12/2007  . Neuropathy ([place]) 02/12/2007  . Allergic rhinitis, cause unspecified 02/12/2007  .  GERD 02/12/2007  . DIVERTICULOSIS, COLON 02/12/2007    Past Surgical History:  Procedure Laterality Date  . ABDOMINAL HYSTERECTOMY    . bilateral arthroscopic knee surgery    . carpel tunnel wrist rt    . CHOLECYSTECTOMY    . CORONARY ANGIOPLASTY     stent  . NASAL SINUS SURGERY     x2  . stent surgery      OB History    No data available       Home Medications    Prior to Admission medications   Medication Sig Start Date End Date Taking? Authorizing Provider  clorazepate (TRANXENE) 3.75 MG tablet Take 3.75 mg by mouth 2 (two) times daily as needed for anxiety. 01/30/17  Yes [provider]    LUMIGAN 0.01 % SOLN Place 1 drop into both eyes at bedtime. 02/11/17  Yes [provider]  alendronate (FOSAMAX) 70 MG tablet TAKE 1 TABLET BY MOUTH EVERY 7 DAYS ON TUESDAYS WITH A FULL GLASS OF WATER & ON AN EMPTY STOMACH 02/22/16   Fay Records, MD  amLODipine (NORVASC) 5 MG tablet Take 1 tablet by mouth at bedtime. 03/20/16   [provider]  apixaban (ELIQUIS) 5 MG TABS tablet Take 1 tablet (5 mg total) by mouth 2 (two) times daily. 01/31/15   Richardson Dopp T, PA-C  atorvastatin (LIPITOR) 20 MG tablet Take 20 mg by mouth daily.    [provider]  Calcium Carbonate-Vitamin D (CALCIUM 500/D PO) Take 1,000 mg by mouth daily.     [provider]  fluticasone (FLONASE) 50 MCG/ACT nasal spray Place 1 spray into both nostrils daily as needed for allergies.  01/17/16   [provider]  gabapentin (NEURONTIN) 600 MG tablet Take 600 mg by mouth 2 (two) times daily.  10/25/13   [provider]  glipiZIDE (GLUCOTROL XL) 5 MG 24 hr tablet Take 5 mg by mouth every evening.     [provider]  Multiple Vitamins-Minerals (PRESERVISION AREDS PO) Take 1 tablet by mouth 2 (two) times daily.    [provider]  nitroGLYCERIN (NITROSTAT) 0.4 MG SL tablet Place 0.4 mLs under the tongue every 5 (five) minutes as needed for chest pain (Max 3 doses).  08/16/14   [provider]  ondansetron (ZOFRAN) 8 MG tablet Take 1 tablet (8 mg total) by mouth every 8 (eight) hours as needed for nausea or vomiting. 12/30/15   Daleen Bo, MD  oxybutynin (DITROPAN) 5 MG tablet Take 5 mg by mouth daily.    [provider]  Sennosides-Docusate Sodium (SENNA S PO) Take by mouth. Take 2 tablets at bedtime for constipation    [provider]  traMADol (ULTRAM) 50 MG tablet Take 2 tablets (100 mg total) by mouth daily as needed for moderate pain. 03/13/16   Reyne Dumas, MD    Family History Family History  Problem Relation Age of Onset  .  Heart attack Father   . Breast cancer Sister   . Heart disease Brother   . Heart attack Son   . Heart attack Daughter   . Colon cancer Neg Hx   . Stroke Neg Hx     Social History Social History  Substance Use Topics  . Smoking status: Never Smoker  . Smokeless tobacco: Never Used  . Alcohol use No     Allergies   Amoxicillin   Review of Systems Review of Systems  All other systems reviewed and are negative.  Physical Exam Updated Vital Signs BP 135/87 (BP Location: Right Arm)   Pulse 82   Temp (!) 97.5 F (36.4 C) (Oral)   Resp 20   Ht 5\' 2"  (1.575 m)   Wt 75.5 kg (166 lb 6.4 oz) Comment: scale B  SpO2 98%   BMI 30.43 kg/m   Physical Exam  Constitutional: She appears well-developed and well-nourished.  HENT:  Head: Normocephalic and atraumatic.  Eyes: Conjunctivae and EOM are normal.  Neck: Normal range of motion.  Cardiovascular: Normal rate and regular rhythm.   Pulmonary/Chest: No stridor. No respiratory distress.  Abdominal: Soft. She exhibits no distension. There is tenderness (suprapubic). There is no guarding.  Musculoskeletal: She exhibits tenderness (right lateral posterior ribs).  Neurological: She is alert.  Skin: Skin is warm and dry. No erythema. No pallor.  Abrasion to left upper back  Nursing note and vitals reviewed.    ED Treatments / Results  Labs (all labs ordered are listed, but only abnormal results are displayed) Labs Reviewed  BASIC METABOLIC PANEL - Abnormal; Notable for the following:       Result Value   CO2 21 (*)    Glucose, Bld 176 (*)    GFR calc non Af Amer 51 (*)    GFR calc Af Amer 59 (*)    All other components within normal limits  URINALYSIS, ROUTINE W REFLEX MICROSCOPIC - Abnormal; Notable for the following:    APPearance HAZY (*)    Ketones, ur 20 (*)    All other components within normal limits  GLUCOSE, CAPILLARY - Abnormal; Notable for the following:    Glucose-Capillary 138 (*)    All other  components within normal limits  CBC  HEPATIC FUNCTION PANEL  PROTIME-INR  TROPONIN I  TROPONIN I  TROPONIN I  BASIC METABOLIC PANEL  CBC    EKG  EKG Interpretation  Date/Time:  Sunday February 17 2017 14:39:36 EDT Ventricular Rate:  92 PR Interval:    QRS Duration: 147 QT Interval:  397 QTC Calculation: 492 R Axis:   72 Text Interpretation:  Atrial fibrillation Right bundle branch block No significant change since last tracing Confirmed by Merrily Pew [id number]) on 02/17/2017 2:52:14 PM       Radiology Dg Chest 2 View  Result Date: 02/17/2017 CLINICAL DATA:  Chest pain after fall at home today. EXAM: CHEST  2 VIEW COMPARISON:  Radiographs of March 10, 2016. FINDINGS: The heart size and mediastinal contours are within normal limits. Both lungs are clear. Atherosclerosis of thoracic aorta is noted. No pneumothorax or pleural effusion is noted. The visualized skeletal structures are unremarkable. IMPRESSION: No active cardiopulmonary disease.  Aortic atherosclerosis. Electronically Signed   By: Marijo Conception, M.D.   On: 02/17/2017 17:32   Ct Head Wo Contrast  Result Date: 02/17/2017 CLINICAL DATA:  Syncope and fall today with head injury. Suspected loss of consciousness. History of atrial fibrillation on anticoagulation. Initial encounter. EXAM: CT HEAD WITHOUT CONTRAST CT CERVICAL SPINE WITHOUT CONTRAST TECHNIQUE: Multidetector CT imaging of the head and cervical spine was performed following the standard protocol without intravenous contrast. Multiplanar CT image reconstructions of the cervical spine were also generated. COMPARISON:  10/18/2016 FINDINGS: CT HEAD FINDINGS Brain: There is no evidence of acute infarct, intracranial hemorrhage, mass, midline shift, or extra-axial fluid collection. Extensive cerebral white matter hypodensities are unchanged and nonspecific but compatible with chronic small vessel ischemic disease. Vascular: Calcified atherosclerosis at the skullbase. No  hyperdense vessel. Skull: No fracture or  focal osseous lesion. Sinuses/Orbits: Prior right-sided sinus surgery. Chronic right maxillary sinusitis. Clear mastoid air cells. Bilateral cataract extraction. Other: None. CT CERVICAL SPINE FINDINGS Alignment: Trace anterolisthesis of C7 on T1, likely degenerative and facet mediated. Skull base and vertebrae: No acute fracture or destructive osseous process. Soft tissues and spinal canal: No prevertebral fluid or swelling. No visible canal hematoma. Disc levels: Moderate anterior vertebral spurring in the lower cervical spine. Overall preserved disc space heights. Moderately advanced multilevel cervical and upper thoracic facet arthrosis. Upper chest: Clear lung apices. Similar appearance of small partially calcified right thyroid nodules. Other: None. IMPRESSION: 1. No evidence of acute intracranial abnormality. 2. Extensive chronic small vessel ischemic disease. 3. No acute fracture or traumatic subluxation identified in the cervical spine. Electronically Signed   By: Logan Bores M.D.   On: 02/17/2017 15:31   Ct Cervical Spine Wo Contrast  Result Date: 02/17/2017 CLINICAL DATA:  Syncope and fall today with head injury. Suspected loss of consciousness. History of atrial fibrillation on anticoagulation. Initial encounter. EXAM: CT HEAD WITHOUT CONTRAST CT CERVICAL SPINE WITHOUT CONTRAST TECHNIQUE: Multidetector CT imaging of the head and cervical spine was performed following the standard protocol without intravenous contrast. Multiplanar CT image reconstructions of the cervical spine were also generated. COMPARISON:  10/18/2016 FINDINGS: CT HEAD FINDINGS Brain: There is no evidence of acute infarct, intracranial hemorrhage, mass, midline shift, or extra-axial fluid collection. Extensive cerebral white matter hypodensities are unchanged and nonspecific but compatible with chronic small vessel ischemic disease. Vascular: Calcified atherosclerosis at the skullbase. No  hyperdense vessel. Skull: No fracture or focal osseous lesion. Sinuses/Orbits: Prior right-sided sinus surgery. Chronic right maxillary sinusitis. Clear mastoid air cells. Bilateral cataract extraction. Other: None. CT CERVICAL SPINE FINDINGS Alignment: Trace anterolisthesis of C7 on T1, likely degenerative and facet mediated. Skull base and vertebrae: No acute fracture or destructive osseous process. Soft tissues and spinal canal: No prevertebral fluid or swelling. No visible canal hematoma. Disc levels: Moderate anterior vertebral spurring in the lower cervical spine. Overall preserved disc space heights. Moderately advanced multilevel cervical and upper thoracic facet arthrosis. Upper chest: Clear lung apices. Similar appearance of small partially calcified right thyroid nodules. Other: None. IMPRESSION: 1. No evidence of acute intracranial abnormality. 2. Extensive chronic small vessel ischemic disease. 3. No acute fracture or traumatic subluxation identified in the cervical spine. Electronically Signed   By: Logan Bores M.D.   On: 02/17/2017 15:31    Procedures Procedures (including critical care time)  Medications Ordered in ED Medications  senna-docusate (Senokot-S) tablet 2 tablet (2 tablets Oral Given 02/17/17 2318)  amLODipine (NORVASC) tablet 5 mg (not administered)  atorvastatin (LIPITOR) tablet 20 mg (20 mg Oral Given 02/17/17 2322)  traMADol (ULTRAM) tablet 50 mg (not administered)  oxybutynin (DITROPAN) tablet 5 mg (5 mg Oral Given 02/17/17 2318)  apixaban (ELIQUIS) tablet 5 mg (5 mg Oral Given 02/17/17 2318)  gabapentin (NEURONTIN) tablet 300 mg (300 mg Oral Given 02/17/17 2318)  sodium chloride flush (NS) 0.9 % injection 3 mL (3 mLs Intravenous Given 02/17/17 2319)  sodium chloride flush (NS) 0.9 % injection 3 mL (0 mLs Intravenous Duplicate 7/84/69 6295)  sodium chloride flush (NS) 0.9 % injection 3 mL (not administered)  0.9 %  sodium chloride infusion (not administered)    acetaminophen (TYLENOL) tablet 650 mg (not administered)    Or  acetaminophen (TYLENOL) suppository 650 mg (not administered)  ondansetron (ZOFRAN) tablet 4 mg (not administered)    Or  ondansetron (ZOFRAN) injection 4 mg (  not administered)  insulin aspart (novoLOG) injection 0-9 Units (not administered)  ondansetron (ZOFRAN) injection 4 mg (4 mg Intravenous Given 02/17/17 1532)  sodium chloride 0.9 % bolus 1,000 mL (0 mLs Intravenous Stopped 02/17/17 1646)  zolpidem (AMBIEN) tablet 5 mg (5 mg Oral Given 02/17/17 2342)     Initial Impression / Assessment and Plan / ED Course  I have reviewed the triage vital signs and the nursing notes.  Pertinent labs & imaging results that were available during my care of the patient were reviewed by me and considered in my medical decision making (see chart for details).     Patient likely syncopal episode but secondary to her cardiac issues, prolonged QT and uncertainty of what happened plan for observation with the hospitalist.  Final Clinical Impressions(s) / ED Diagnoses   Final diagnoses:  Syncope and collapse     Sandro Burgo, Corene Cornea, MD 02/18/17 4097

## 2017-02-17 NOTE — ED Notes (Signed)
EDP at bedside  

## 2017-02-17 NOTE — ED Notes (Signed)
Patient is stable and ready to be transport to the floor at this time.  Report was called to 3E RN.  Belongings taken with the patient to the floor.   

## 2017-02-17 NOTE — Progress Notes (Signed)
New Admission Note:   Arrival Method: ED bed with ED nurse tech, ambulated to unit bed Mental Orientation: A&O x4 Telemetry: initiated Skin: no issues Pain: denies Safety Measures: implemented   Orders to be reviewed and implemented. Will continue to monitor the patient. Call light has been placed within reach and bed alarm has been activated.   Riki Altes, RN Phone: [phone]

## 2017-02-17 NOTE — ED Triage Notes (Signed)
Pt. Coming from home via [facility] for syncope and fall today. Pt. Not feeling well and went to her neighbor for help. Pt. Walked back to house, fell, and hit her head. Pt. Does not remember events, so EMS presumes she loss consciousness. Pt. On eliquis for afib. Pt. States pain 2/10, but cannot tell RN where it hurts. Pt. Disoriented to time and situation. EMS reports pain with palpation to neck and upper back. Pt. Vomiting during transport. Pt. Did not tolerate c-collar.

## 2017-02-17 NOTE — ED Notes (Signed)
Patient transported to CT 

## 2017-02-18 ENCOUNTER — Observation Stay (HOSPITAL_BASED_OUTPATIENT_CLINIC_OR_DEPARTMENT_OTHER): Payer: Medicare Other

## 2017-02-18 ENCOUNTER — Observation Stay (HOSPITAL_COMMUNITY): Payer: Medicare Other

## 2017-02-18 DIAGNOSIS — I10 Essential (primary) hypertension: Secondary | ICD-10-CM | POA: Diagnosis not present

## 2017-02-18 DIAGNOSIS — I361 Nonrheumatic tricuspid (valve) insufficiency: Secondary | ICD-10-CM

## 2017-02-18 DIAGNOSIS — G629 Polyneuropathy, unspecified: Secondary | ICD-10-CM | POA: Diagnosis not present

## 2017-02-18 DIAGNOSIS — E119 Type 2 diabetes mellitus without complications: Secondary | ICD-10-CM | POA: Diagnosis not present

## 2017-02-18 DIAGNOSIS — I482 Chronic atrial fibrillation: Secondary | ICD-10-CM | POA: Diagnosis not present

## 2017-02-18 DIAGNOSIS — R55 Syncope and collapse: Secondary | ICD-10-CM | POA: Diagnosis not present

## 2017-02-18 DIAGNOSIS — E1122 Type 2 diabetes mellitus with diabetic chronic kidney disease: Secondary | ICD-10-CM | POA: Diagnosis not present

## 2017-02-18 DIAGNOSIS — I129 Hypertensive chronic kidney disease with stage 1 through stage 4 chronic kidney disease, or unspecified chronic kidney disease: Secondary | ICD-10-CM | POA: Diagnosis not present

## 2017-02-18 LAB — CBC
HCT: 38.8 % (ref 36.0–46.0)
HEMOGLOBIN: 13.4 g/dL (ref 12.0–15.0)
MCH: 31 pg (ref 26.0–34.0)
MCHC: 34.5 g/dL (ref 30.0–36.0)
MCV: 89.8 fL (ref 78.0–100.0)
PLATELETS: 195 10*3/uL (ref 150–400)
RBC: 4.32 MIL/uL (ref 3.87–5.11)
RDW: 13.3 % (ref 11.5–15.5)
WBC: 7 10*3/uL (ref 4.0–10.5)

## 2017-02-18 LAB — MAGNESIUM: MAGNESIUM: 1.9 mg/dL (ref 1.7–2.4)

## 2017-02-18 LAB — BASIC METABOLIC PANEL
ANION GAP: 8 (ref 5–15)
BUN: 11 mg/dL (ref 6–20)
CHLORIDE: 104 mmol/L (ref 101–111)
CO2: 27 mmol/L (ref 22–32)
CREATININE: 0.92 mg/dL (ref 0.44–1.00)
Calcium: 9 mg/dL (ref 8.9–10.3)
GFR calc non Af Amer: 53 mL/min — ABNORMAL LOW (ref >60)
Glucose, Bld: 110 mg/dL — ABNORMAL HIGH (ref 65–99)
Potassium: 3.4 mmol/L — ABNORMAL LOW (ref 3.5–5.1)
SODIUM: 139 mmol/L (ref 135–145)

## 2017-02-18 LAB — ECHOCARDIOGRAM COMPLETE
Height: 62 in
Weight: 2652.8 oz

## 2017-02-18 LAB — GLUCOSE, CAPILLARY
GLUCOSE-CAPILLARY: 104 mg/dL — AB (ref 65–99)
GLUCOSE-CAPILLARY: 186 mg/dL — AB (ref 65–99)
GLUCOSE-CAPILLARY: 138 mg/dL — AB (ref 65–99)
Glucose-Capillary: 120 mg/dL — ABNORMAL HIGH (ref 65–99)

## 2017-02-18 LAB — TROPONIN I
Troponin I: <0.03 ng/mL (ref <0.03)
Troponin I: <0.03 ng/mL (ref <0.03)

## 2017-02-18 MED ORDER — ZOLPIDEM TARTRATE 5 MG PO TABS
2.5000 mg | ORAL_TABLET | Freq: Once | ORAL | Status: AC
  Administered 2017-02-19: 2.5 mg via ORAL
  Filled 2017-02-18: qty 1

## 2017-02-18 MED ORDER — POTASSIUM CHLORIDE CRYS ER 20 MEQ PO TBCR: 40.0000 meq | EXTENDED_RELEASE_TABLET | Freq: Once | ORAL | Status: DC

## 2017-02-18 NOTE — Procedures (Signed)
History: 83 year old female being evaluated for syncope  Sedation: None  Technique: This is a 21 channel routine scalp EEG performed at the bedside with bipolar and monopolar montages arranged in accordance to the international 10/20 system of electrode placement. One channel was dedicated to EKG recording.    Background: The background consists of intermixed alpha and beta activities. There is a well defined posterior dominant rhythm of 9-10 Hz that attenuates with eye opening. Sleep is recorded with normal appearing structures.   Photic stimulation: Physiologic driving is not performed  EEG Abnormalities: None  Clinical Interpretation: This normal EEG is recorded in the waking and sleep state. There was no seizure or seizure predisposition recorded on this study. Please note that a normal EEG does not preclude the possibility of epilepsy.   Roland Rack, MD [facility] [phone]  If 7pm- 7am, please page neurology on call as listed in [place].

## 2017-02-18 NOTE — Progress Notes (Signed)
Patient feels that she will need home health care for a few hours a day when she is discharged.

## 2017-02-18 NOTE — Progress Notes (Signed)
PROGRESS NOTE    [name]  XIP:[id number] DOB: 11/03/1923 DOA: 02/17/2017 PCP: Chesley Noon, MD   Outpatient Specialists:     Brief Narrative:  [name] is a 83 y.o. female with a past medical history of hypertension, coronary artery disease status post stent in the remote past, history of atrial fibrillation on anticoagulation, history of type 2 diabetes on oral medications, previous history of postural hypotension, previous history of syncope and pause. Seen by electrophysiologist last year and syncope was thought to be vagally mediated. Not considered a candidate for pacemaker at that time. Patient is not a very good historian. She doesn't remember the events of this morning. She tells me that she was feeling quite well yesterday. This morning when she woke up she was feeling well. She went to her neighbor's house apparently to request them to get medications from pharmacy. But this information may not be accurate. She came back home and apparently passed out. Doesn't remember the events of this episode. She denies any chest pain, shortness of breath or lightheadedness prior to this episode. She does use a walker to ambulate and did use the walker this morning. She did have a urinary incontinence associated with the syncope. No tongue bites. No headaches. No vision disturbances. No seizure type activity that she can recall. No weakness on any one side of her body. No tingling or numbness. Denies any dysuria per se, but has to go to the bathroom frequently which is chronic for her. Denies any diarrhea. No fever, no chills.  In the emergency department, patient was noted to be in atrial fibrillation with normal rate. Workup was unremarkable. Due to syncope and her cardiac history she merits observation in the hospital.   Assessment & Plan:   Active Problems:   HLD (hyperlipidemia)   Neuropathy (HCC)   Coronary artery disease   Atrial fibrillation, permanent   Diabetes  mellitus type II, non insulin dependent (HCC)   HTN (hypertension)   Chronic anticoagulation for A fib, CHA2DS2VASc = 6   Syncope   Syncope -tele: rate controlled a fib -echo -EEG PT/OT consult: Orthostatics negative -had episode yesterday   A fib -chronic -eliquis  DM -SSI -resume home meds upon d/c -neurontin  Hypokalemia -replete    DVT prophylaxis:  Fully anticoagulated   Code Status: Full Code   Family Communication:   Disposition Plan:     Consultants:      Subjective: No current complaints, does not remember what happened yesterday  Objective: Vitals:   02/17/17 2002 02/18/17 0200 02/18/17 0342 02/18/17 1210  BP: 135/87 138/63 136/66 (!) 118/58  Pulse: 82 81 82 82  Resp: 20 20 18    Temp: (!) 97.5 F (36.4 C) 98.1 F (36.7 C) 98 F (36.7 C) 97.8 F (36.6 C)  TempSrc: Oral Oral Oral Oral  SpO2: 98% 95% 96%   Weight: 75.5 kg (166 lb 6.4 oz)  75.2 kg (165 lb 12.8 oz)   Height: 5\' 2"  (1.575 m)       Intake/Output Summary (Last 24 hours) at 02/18/17 1445 Last data filed at 02/18/17 0956  Gross per 24 hour  Intake             1480 ml  Output              850 ml  Net              630 ml   Filed Weights   02/17/17 1437 02/17/17 2002  02/18/17 0342  Weight: 77.1 kg (170 lb) 75.5 kg (166 lb 6.4 oz) 75.2 kg (165 lb 12.8 oz)    Examination:  General exam: Appears calm and comfortable  Respiratory system: Clear to auscultation. Respiratory effort normal. Cardiovascular system: S1 & S2 heard, RRR. No JVD, murmurs, rubs, gallops or clicks. No pedal edema. Gastrointestinal system: Abdomen is nondistended, soft and nontender. No organomegaly or masses felt. Normal bowel sounds heard. Central nervous system: Alert- pleasantly confused Extremities: Symmetric 5 x 5 power. Skin: No rashes, lesions or ulcers Psychiatry: Judgement and insight appear normal. Mood & affect appropriate.     Data Reviewed: I have personally reviewed following  labs and imaging studies  CBC:  Recent Labs Lab 02/17/17 1441 02/18/17 0657  WBC 6.7 7.0  HGB 14.3 13.4  HCT 41.4 38.8  MCV 89.2 89.8  PLT 215 878   Basic Metabolic Panel:  Recent Labs Lab 02/17/17 1441 02/18/17 0657  NA 139 139  K 3.7 3.4*  CL 103 104  CO2 21* 27  GLUCOSE 176* 110*  BUN 16 11  CREATININE 0.95 0.92  CALCIUM 9.3 9.0   GFR: Estimated Creatinine Clearance: 38.6 mL/min (by C-G formula based on SCr of 0.92 mg/dL). Liver Function Tests:  Recent Labs Lab 02/17/17 1441  AST 32  ALT 15  ALKPHOS 61  BILITOT 1.2  PROT 7.1  ALBUMIN 3.8   No results for input(s): LIPASE, AMYLASE in the last 168 hours. No results for input(s): AMMONIA in the last 168 hours. Coagulation Profile:  Recent Labs Lab 02/17/17 1441  INR 1.10   Cardiac Enzymes:  Recent Labs Lab 02/17/17 2024 02/18/17 0140 02/18/17 0657  TROPONINI <0.03 <0.03 <0.03   BNP (last 3 results) No results for input(s): PROBNP in the last 8760 hours. HbA1C: No results for input(s): HGBA1C in the last 72 hours. CBG:  Recent Labs Lab 02/17/17 2113 02/18/17 0639 02/18/17 0744 02/18/17 1206  GLUCAP 138* 104* 120* 186*   Lipid Profile: No results for input(s): CHOL, HDL, LDLCALC, TRIG, CHOLHDL, LDLDIRECT in the last 72 hours. Thyroid Function Tests: No results for input(s): TSH, T4TOTAL, FREET4, T3FREE, THYROIDAB in the last 72 hours. Anemia Panel: No results for input(s): VITAMINB12, FOLATE, FERRITIN, TIBC, IRON, RETICCTPCT in the last 72 hours. Urine analysis:    Component Value Date/Time   COLORURINE YELLOW 02/17/2017 1638   APPEARANCEUR HAZY (A) 02/17/2017 1638   LABSPEC 1.009 02/17/2017 1638   PHURINE 6.0 02/17/2017 1638   GLUCOSEU NEGATIVE 02/17/2017 1638   GLUCOSEU NEGATIVE 11/24/2010 1224   HGBUR NEGATIVE 02/17/2017 1638   BILIRUBINUR NEGATIVE 02/17/2017 1638   KETONESUR 20 (A) 02/17/2017 1638   PROTEINUR NEGATIVE 02/17/2017 1638   UROBILINOGEN 0.2 05/05/2014 2123    NITRITE NEGATIVE 02/17/2017 1638   LEUKOCYTESUR NEGATIVE 02/17/2017 1638     )No results found for this or any previous visit (from the past 240 hour(s)).    Anti-infectives    None       Radiology Studies: Dg Chest 2 View  Result Date: 02/17/2017 CLINICAL DATA:  Chest pain after fall at home today. EXAM: CHEST  2 VIEW COMPARISON:  Radiographs of March 10, 2016. FINDINGS: The heart size and mediastinal contours are within normal limits. Both lungs are clear. Atherosclerosis of thoracic aorta is noted. No pneumothorax or pleural effusion is noted. The visualized skeletal structures are unremarkable. IMPRESSION: No active cardiopulmonary disease.  Aortic atherosclerosis. Electronically Signed   By: Marijo Conception, M.D.   On: 02/17/2017 17:32  Ct Head Wo Contrast  Result Date: 02/17/2017 CLINICAL DATA:  Syncope and fall today with head injury. Suspected loss of consciousness. History of atrial fibrillation on anticoagulation. Initial encounter. EXAM: CT HEAD WITHOUT CONTRAST CT CERVICAL SPINE WITHOUT CONTRAST TECHNIQUE: Multidetector CT imaging of the head and cervical spine was performed following the standard protocol without intravenous contrast. Multiplanar CT image reconstructions of the cervical spine were also generated. COMPARISON:  10/18/2016 FINDINGS: CT HEAD FINDINGS Brain: There is no evidence of acute infarct, intracranial hemorrhage, mass, midline shift, or extra-axial fluid collection. Extensive cerebral white matter hypodensities are unchanged and nonspecific but compatible with chronic small vessel ischemic disease. Vascular: Calcified atherosclerosis at the skullbase. No hyperdense vessel. Skull: No fracture or focal osseous lesion. Sinuses/Orbits: Prior right-sided sinus surgery. Chronic right maxillary sinusitis. Clear mastoid air cells. Bilateral cataract extraction. Other: None. CT CERVICAL SPINE FINDINGS Alignment: Trace anterolisthesis of C7 on T1, likely degenerative and  facet mediated. Skull base and vertebrae: No acute fracture or destructive osseous process. Soft tissues and spinal canal: No prevertebral fluid or swelling. No visible canal hematoma. Disc levels: Moderate anterior vertebral spurring in the lower cervical spine. Overall preserved disc space heights. Moderately advanced multilevel cervical and upper thoracic facet arthrosis. Upper chest: Clear lung apices. Similar appearance of small partially calcified right thyroid nodules. Other: None. IMPRESSION: 1. No evidence of acute intracranial abnormality. 2. Extensive chronic small vessel ischemic disease. 3. No acute fracture or traumatic subluxation identified in the cervical spine. Electronically Signed   By: Logan Bores M.D.   On: 02/17/2017 15:31   Ct Cervical Spine Wo Contrast  Result Date: 02/17/2017 CLINICAL DATA:  Syncope and fall today with head injury. Suspected loss of consciousness. History of atrial fibrillation on anticoagulation. Initial encounter. EXAM: CT HEAD WITHOUT CONTRAST CT CERVICAL SPINE WITHOUT CONTRAST TECHNIQUE: Multidetector CT imaging of the head and cervical spine was performed following the standard protocol without intravenous contrast. Multiplanar CT image reconstructions of the cervical spine were also generated. COMPARISON:  10/18/2016 FINDINGS: CT HEAD FINDINGS Brain: There is no evidence of acute infarct, intracranial hemorrhage, mass, midline shift, or extra-axial fluid collection. Extensive cerebral white matter hypodensities are unchanged and nonspecific but compatible with chronic small vessel ischemic disease. Vascular: Calcified atherosclerosis at the skullbase. No hyperdense vessel. Skull: No fracture or focal osseous lesion. Sinuses/Orbits: Prior right-sided sinus surgery. Chronic right maxillary sinusitis. Clear mastoid air cells. Bilateral cataract extraction. Other: None. CT CERVICAL SPINE FINDINGS Alignment: Trace anterolisthesis of C7 on T1, likely degenerative and  facet mediated. Skull base and vertebrae: No acute fracture or destructive osseous process. Soft tissues and spinal canal: No prevertebral fluid or swelling. No visible canal hematoma. Disc levels: Moderate anterior vertebral spurring in the lower cervical spine. Overall preserved disc space heights. Moderately advanced multilevel cervical and upper thoracic facet arthrosis. Upper chest: Clear lung apices. Similar appearance of small partially calcified right thyroid nodules. Other: None. IMPRESSION: 1. No evidence of acute intracranial abnormality. 2. Extensive chronic small vessel ischemic disease. 3. No acute fracture or traumatic subluxation identified in the cervical spine. Electronically Signed   By: Logan Bores M.D.   On: 02/17/2017 15:31        Scheduled Meds: . amLODipine  5 mg Oral QHS  . apixaban  5 mg Oral BID  . atorvastatin  20 mg Oral Daily  . gabapentin  300 mg Oral BID  . insulin aspart  0-9 Units Subcutaneous TID WC  . oxybutynin  5 mg Oral Daily  .  senna-docusate  2 tablet Oral QHS  . sodium chloride flush  3 mL Intravenous Q12H  . sodium chloride flush  3 mL Intravenous Q12H   Continuous Infusions: . sodium chloride       LOS: 0 days    Time spent: 25 min    [place], DO [facility] Pager [phone]  If 7PM-7AM, please contact night-coverage [email or web address] Password [email or web address] 02/18/2017, 2:45 PM

## 2017-02-18 NOTE — Evaluation (Addendum)
Physical Therapy Evaluation Patient Details Name: [name] MRN: [id number] DOB: 11-05-1923 Today's Date: 02/18/2017   History of Present Illness  83 y/o female admitted with syncope and fall. PMH: HTN, orthostatic hypotension, HLD, CAD< PAF, 1st degree AV block, anemia, DM type 2 with neuropathy, GERD, IBS, and RA.  Clinical Impression  Pt admitted with above diagnosis. Pt currently with functional limitations due to the deficits listed below (see PT Problem List).  Pt will benefit from skilled PT to increase their independence and safety with mobility to allow discharge to the venue listed below.  Pt limited by fatigue and pain in R low back.  Recommend increased A at home until she gets stronger. Pt reports her sister in law can A, but pt also states she uses a walker.  Encouraged pt to speak with other family members.  Recommend HHPT and initial 24 hour A until she gets a bit stronger.   Orthostatics: Supine 121/95 HR 71 Sitting 113/99 HR 114 Standing 134/113 HR 83     Follow Up Recommendations Home health PT;Supervision for mobility/OOB    Equipment Recommendations  None recommended by PT    Recommendations for Other Services       Precautions / Restrictions Precautions Precautions: Fall Precaution Comments: 2 falls in last 6 months Restrictions Weight Bearing Restrictions: No      Mobility  Bed Mobility Overal bed mobility: Needs Assistance Bed Mobility: Rolling;Sidelying to Sit Rolling: Supervision Sidelying to sit: Min assist       General bed mobility comments: Rolled with S and then needed MIN A to get trunk upright with 6/10 pain R low back.  Pt does state she sleeps in recliner chair at home.  Transfers Overall transfer level: Needs assistance Equipment used: Rolling walker (2 wheeled) Transfers: Sit to/from Stand Sit to Stand: Min guard         General transfer comment: cues for hand placement  Ambulation/Gait Ambulation/Gait assistance: Min  guard Ambulation Distance (Feet): 30 Feet Assistive device: Rolling walker (2 wheeled) Gait Pattern/deviations: Step-through pattern;Decreased step length - right;Decreased step length - left;Trunk flexed Gait velocity: decreased   General Gait Details: decreased step length, but too fatigued to ambulate farther than 30'  Stairs            Wheelchair Mobility    Modified Rankin (Stroke Patients Only)       Balance Overall balance assessment: Needs assistance;History of Falls Sitting-balance support: Feet supported Sitting balance-Leahy Scale: Good     Standing balance support: Bilateral upper extremity supported Standing balance-Leahy Scale: Poor Standing balance comment: requires UE support                             Pertinent Vitals/Pain Pain Assessment: 0-10 Pain Score: 6  Pain Location: R shoulder and arm Pain Descriptors / Indicators: Sore Pain Intervention(s): Limited activity within patient's tolerance    Home Living Family/patient expects to be discharged to:: Private residence Living Arrangements: Alone Available Help at Discharge: Family;Available PRN/intermittently Type of Home: House Home Access: Stairs to enter Entrance Stairs-Rails: Right Entrance Stairs-Number of Steps: 2 Home Layout: Two level;Able to live on main level with bedroom/bathroom Home Equipment: Walker - 4 wheels;Bedside commode;Shower seat;Grab bars - tub/shower;Hand held shower head      Prior Function Level of Independence: Independent with assistive device(s)         Comments: ADLs and has someone come in once and awhile for cleaning. Uses  rollator for functional mobility.      Hand Dominance   Dominant Hand: Right    Extremity/Trunk Assessment   Upper Extremity Assessment Upper Extremity Assessment: Defer to OT evaluation    Lower Extremity Assessment Lower Extremity Assessment: Generalized weakness    Cervical / Trunk Assessment Cervical / Trunk  Assessment: Normal  Communication   Communication: No difficulties  Cognition Arousal/Alertness: Awake/alert Behavior During Therapy: WFL for tasks assessed/performed Overall Cognitive Status: Within Functional Limits for tasks assessed                                        General Comments      Exercises     Assessment/Plan    PT Assessment Patient needs continued PT services  PT Problem List Decreased strength;Decreased activity tolerance;Decreased balance;Decreased mobility;Decreased knowledge of use of DME;Decreased safety awareness       PT Treatment Interventions DME instruction;Gait training;Stair training;Functional mobility training;Therapeutic activities;Therapeutic exercise;Patient/family education;Balance training    PT Goals (Current goals can be found in the Care Plan section)  Acute Rehab PT Goals Patient Stated Goal: to go home PT Goal Formulation: With patient Time For Goal Achievement: 03/04/17 Potential to Achieve Goals: Good    Frequency Min 3X/week   Barriers to discharge Decreased caregiver support      Co-evaluation               AM-PAC PT "6 Clicks" Daily Activity  Outcome Measure Difficulty turning over in bed (including adjusting bedclothes, sheets and blankets)?: A Lot Difficulty moving from lying on back to sitting on the side of the bed? : Total Difficulty sitting down on and standing up from a chair with arms (e.g., wheelchair, bedside commode, etc,.)?: A Little Help needed moving to and from a bed to chair (including a wheelchair)?: A Little Help needed walking in hospital room?: A Little Help needed climbing 3-5 steps with a railing? : A Little 6 Click Score: 15    End of Session Equipment Utilized During Treatment: Gait belt Activity Tolerance: Patient limited by fatigue Patient left: in chair;Other (comment) (OT entering room. Pt left in chair at sink.) Nurse Communication: Mobility status PT Visit  Diagnosis: Muscle weakness (generalized) (M62.81);Difficulty in walking, not elsewhere classified (R26.2);History of falling (Z91.81)    Time: 1041-1106 PT Time Calculation (min) (ACUTE ONLY): 25 min   Charges:   PT Evaluation $PT Eval Moderate Complexity: 1 Procedure PT Treatments $Gait Training: 8-22 mins   PT G Codes:   PT G-Codes **NOT FOR INPATIENT CLASS** Functional Assessment Tool Used: AM-PAC 6 Clicks Basic Mobility;Clinical judgement Functional Limitation: Mobility: Walking and moving around Mobility: Walking and Moving Around Current Status (Y6503): At least 40 percent but less than 60 percent impaired, limited or restricted Mobility: Walking and Moving Around Goal Status [phone]): At least 1 percent but less than 20 percent impaired, limited or restricted    Santiago Glad L. Tamala Julian, [place] Pager [phone] 02/18/2017   Galen Manila 02/18/2017, 11:20 AM

## 2017-02-18 NOTE — Evaluation (Signed)
Occupational Therapy Evaluation Patient Details Name: [name] MRN: [id number] DOB: [birth year] Today's Date: 02/18/2017    History of Present Illness 83 y/o female admitted with syncope and fall. PMH: HTN, orthostatic hypotension, HLD, CAD< PAF, 1st degree AV block, anemia, DM type 2 with neuropathy, GERD, IBS, and RA.   Clinical Impression   PTA, pt was living alone and performing ADLs and light IADLs. Currently, pt performing ADLs and functional mobility with RW at [facility] level. Pt would benefit from acute OT to facilitate safe dc. Recommend dc with HHOT and 24 hour intitial supervision to increase safety and independence with ADLs and functional mobility.     Follow Up Recommendations  Home health OT;Supervision/Assistance - 24 hour;Other (comment) ([place] aide for IADLs)    Equipment Recommendations  None recommended by OT    Recommendations for Other Services PT consult     Precautions / Restrictions Precautions Precautions: Fall Precaution Comments: 2 falls in last 6 months Restrictions Weight Bearing Restrictions: No      Mobility Bed Mobility               General bed mobility comments: OOB with PT  Transfers Overall transfer level: Needs assistance Equipment used: Rolling walker (2 wheeled) Transfers: Sit to/from Stand Sit to Stand: Min guard         General transfer comment: cues for hand placement    Balance Overall balance assessment: Needs assistance;History of Falls Sitting-balance support: Feet supported Sitting balance-Leahy Scale: Good     Standing balance support: Bilateral upper extremity supported Standing balance-Leahy Scale: Fair Standing balance comment: Able to perform tasks at sink without UE support. Min guard for safety                           ADL either performed or assessed with clinical judgement   ADL Overall ADL's : Needs assistance/impaired     Grooming: Wash/dry face;Brushing hair;Min  guard;Standing   Upper Body Bathing: Set up;Sitting   Lower Body Bathing: Min guard;Sit to/from stand   Upper Body Dressing : Set up;Sitting   Lower Body Dressing: Min guard;Sit to/from stand Lower Body Dressing Details (indicate cue type and reason): Min guard for safety. Demosntrates good ROM to adjust bil socks Toilet Transfer: Min guard;Ambulation;RW (Simulated at recliner)           Functional mobility during ADLs: Min guard;Rolling walker General ADL Comments: Pt performing ADLs and functional mobility at [employer] A for safety. Pt demonstrating balance, fine motor skills, and bilateral coordination while fixing a cup of coffee at sink.      Vision Baseline Vision/History: Wears glasses       Perception     Praxis      Pertinent Vitals/Pain Pain Location: R shoulder and arm Pain Descriptors / Indicators: Sore Pain Intervention(s): Monitored during session;Limited activity within patient's tolerance;Repositioned     Hand Dominance Right   Extremity/Trunk Assessment Upper Extremity Assessment Upper Extremity Assessment: Generalized weakness;RUE deficits/detail;LUE deficits/detail RUE Deficits / Details: Pt with pain in R arm and shoulder from fall. However, has WFL AROM of shoulder, elbow, and wrist. LUE Deficits / Details: Pt with decreased shoulder ROM.  forward flex AROM 0-30 and ROM 0-45 with compensatory movements for both AROM/PROM LUE Coordination: decreased gross motor   Lower Extremity Assessment Lower Extremity Assessment: Defer to PT evaluation;Generalized weakness   Cervical / Trunk Assessment Cervical / Trunk Assessment: Normal   Communication Communication Communication: No  difficulties   Cognition Arousal/Alertness: Awake/alert Behavior During Therapy: WFL for tasks assessed/performed Overall Cognitive Status: Within Functional Limits for tasks assessed                                     General Comments  Pt would benefit  from [facility] for IADLs including cooking and cleaning    Exercises     Shoulder Instructions      Home Living Family/patient expects to be discharged to:: Private residence Living Arrangements: Alone Available Help at Discharge: Family;Available PRN/intermittently Type of Home: House Home Access: Stairs to enter [employer] of Steps: 2 Entrance Stairs-Rails: Right Home Layout: Two level;Able to live on main level with bedroom/bathroom Alternate Level Stairs-Number of Steps: flight - does not use 2nd level   Bathroom Shower/Tub: Walk-in shower;Curtain   Bathroom Toilet: Handicapped height     Home Equipment: Environmental consultant - 4 wheels;Bedside commode;Shower seat;Grab bars - tub/shower;Hand held shower head          Prior Functioning/Environment Level of Independence: Independent with assistive device(s)        Comments: ADLs and has someone come in once and awhile for cleaning. Uses rollator for functional mobility.         OT Problem List: Decreased strength;Decreased range of motion;Decreased activity tolerance;Impaired balance (sitting and/or standing);Decreased safety awareness;Decreased knowledge of use of DME or AE;Impaired UE functional use      OT Treatment/Interventions: Self-care/ADL training;Therapeutic exercise;Energy conservation;DME and/or AE instruction;Therapeutic activities;Patient/family education    OT Goals(Current goals can be found in the care plan section) Acute Rehab OT Goals Patient Stated Goal: to go home OT Goal Formulation: With patient Time For Goal Achievement: 03/04/17 Potential to Achieve Goals: Good ADL Goals Pt Will Perform Grooming: with set-up;with supervision;standing Pt Will Perform Lower Body Bathing: with set-up;with supervision;sit to/from stand Pt Will Perform Lower Body Dressing: with set-up;with supervision;sit to/from stand Pt Will Transfer to Toilet: with set-up;with supervision;ambulating;bedside commode Pt Will  Perform Toileting - Clothing Manipulation and hygiene: with set-up;with supervision;sit to/from stand Additional ADL Goal #1: Pt will verbalize three fall prevention techniques with Min VCs  OT Frequency: Min 2X/week   Barriers to D/C:            Co-evaluation              AM-PAC PT "6 Clicks" Daily Activity     Outcome Measure Help from another person eating meals?: None Help from another person taking care of personal grooming?: A Little Help from another person toileting, which includes using toliet, bedpan, or urinal?: A Little Help from another person bathing (including washing, rinsing, drying)?: A Little Help from another person to put on and taking off regular upper body clothing?: A Little Help from another person to put on and taking off regular lower body clothing?: A Little 6 Click Score: 19   End of Session Equipment Utilized During Treatment: Gait belt;Rolling walker Nurse Communication: Mobility status  Activity Tolerance: Patient tolerated treatment well Patient left: with call bell/phone within reach;in chair;with chair alarm set  OT Visit Diagnosis: Unsteadiness on feet (R26.81);Other abnormalities of gait and mobility (R26.89);Muscle weakness (generalized) (M62.81);History of falling (Z91.81)                Time: 3790-2409 OT Time Calculation (min): 22 min Charges:  OT General Charges $OT Visit: 1 Procedure OT Evaluation $OT Eval Low Complexity: 1 Procedure G-Codes: OT G-codes **  NOT FOR INPATIENT CLASS** Functional Assessment Tool Used: Clinical judgement Functional Limitation: Self care Self Care Current Status (X9584): At least 1 percent but less than 20 percent impaired, limited or restricted Self Care Goal Status (Y1712): 0 percent impaired, limited or restricted   [place], OTR/L Acute Rehab Pager: [phone] Office: [place] 02/18/2017, 5:12 PM

## 2017-02-18 NOTE — Progress Notes (Signed)
Routine EEG completed, results pending. 

## 2017-02-18 NOTE — Progress Notes (Signed)
  Echocardiogram 2D Echocardiogram has been performed.  Donata Clay 02/18/2017, 1:59 PM

## 2017-02-19 DIAGNOSIS — E119 Type 2 diabetes mellitus without complications: Secondary | ICD-10-CM | POA: Diagnosis not present

## 2017-02-19 DIAGNOSIS — I10 Essential (primary) hypertension: Secondary | ICD-10-CM | POA: Diagnosis not present

## 2017-02-19 DIAGNOSIS — G629 Polyneuropathy, unspecified: Secondary | ICD-10-CM | POA: Diagnosis not present

## 2017-02-19 DIAGNOSIS — I482 Chronic atrial fibrillation: Secondary | ICD-10-CM | POA: Diagnosis not present

## 2017-02-19 LAB — GLUCOSE, CAPILLARY: Glucose-Capillary: 129 mg/dL — ABNORMAL HIGH (ref 65–99)

## 2017-02-19 MED ORDER — GABAPENTIN 600 MG PO TABS: 300.0000 mg | ORAL_TABLET | Freq: Two times a day (BID) | ORAL | Status: DC

## 2017-02-19 MED ORDER — TRAMADOL HCL 50 MG PO TABS: 50.0000 mg | ORAL_TABLET | Freq: Two times a day (BID) | ORAL | 0 refills | Status: DC | PRN

## 2017-02-19 NOTE — Progress Notes (Signed)
Physical Therapy Treatment Patient Details Name: [name] MRN: [id number] DOB: [birth year] Today's Date: 02/19/2017    History of Present Illness 83 y/o female admitted with syncope and fall. PMH: HTN, orthostatic hypotension, HLD, CAD< PAF, 1st degree AV block, anemia, DM type 2 with neuropathy, GERD, IBS, and RA.    PT Comments    Pt is making good progress towards her goals today. Pt is currently minA for bed mobility and min guard for transfers, ambulation of 140 feet with RW and ascent/descent of 2 steps.  Pt reports feeling much better walking today. Pt requires skilled PT to continue to progress her gait training and to improve her LE strength and endurance to safely navigate in her home environment.  Follow Up Recommendations  Home health PT;Supervision for mobility/OOB     Equipment Recommendations  None recommended by PT       Precautions / Restrictions Precautions Precautions: Fall Precaution Comments: 2 falls in last 6 months Restrictions Weight Bearing Restrictions: No    Mobility  Bed Mobility Overal bed mobility: Needs Assistance Bed Mobility: Supine to Sit     Supine to sit: Min assist     General bed mobility comments: minA for trunk to upright, pt states that she sleeps in her recliner at home and it is easier to get in and out of   Transfers Overall transfer level: Needs assistance Equipment used: Rolling walker (2 wheeled) Transfers: Sit to/from Stand Sit to Stand: Min guard         General transfer comment: pt uses momentum for power up but able to come to static standing with RW   Ambulation/Gait Ambulation/Gait assistance: Min guard Ambulation Distance (Feet): 140 Feet Assistive device: Rolling walker (2 wheeled) Gait Pattern/deviations: Step-through pattern;Decreased stride length;Trunk flexed Gait velocity: decreased Gait velocity interpretation: Below normal speed for age/gender General Gait Details: min guard for safety, vc for  upright posture and looking up and out while ambulating   Stairs Stairs: Yes   Stair Management: One rail Left;Step to pattern;Forwards;Sideways Number of Stairs: 2 General stair comments: pt with slow steady ascent forward and sideways utilizing railing to descend,         Balance Overall balance assessment: Needs assistance;History of Falls Sitting-balance support: Feet supported Sitting balance-Leahy Scale: Good     Standing balance support: Bilateral upper extremity supported Standing balance-Leahy Scale: Fair Standing balance comment: able to fix items on bedside table while in static stance                            Cognition Arousal/Alertness: Awake/alert Behavior During Therapy: WFL for tasks assessed/performed Overall Cognitive Status: Within Functional Limits for tasks assessed                                           General Comments General comments (skin integrity, edema, etc.): at rest HR 95 bpm after ambulation HR 115 bpm with 2/4 DoE      Pertinent Vitals/Pain Pain Assessment: Faces Faces Pain Scale: No hurt Pain Intervention(s): Monitored during session;Limited activity within patient's tolerance           PT Goals (current goals can now be found in the care plan section) Acute Rehab PT Goals Patient Stated Goal: to go home PT Goal Formulation: With patient Time For Goal Achievement: 03/04/17 Potential to Achieve  Goals: Good Progress towards PT goals: Progressing toward goals    Frequency    Min 3X/week      PT Plan Current plan remains appropriate       AM-PAC PT "6 Clicks" Daily Activity  Outcome Measure  Difficulty turning over in bed (including adjusting bedclothes, sheets and blankets)?: A Lot Difficulty moving from lying on back to sitting on the side of the bed? : Total Difficulty sitting down on and standing up from a chair with arms (e.g., wheelchair, bedside commode, etc,.)?: A Little Help  needed moving to and from a bed to chair (including a wheelchair)?: A Little Help needed walking in hospital room?: A Little Help needed climbing 3-5 steps with a railing? : A Little 6 Click Score: 15    End of Session Equipment Utilized During Treatment: Gait belt Activity Tolerance: Patient tolerated treatment well Patient left: in chair;with call bell/phone within reach;with chair alarm set Nurse Communication: Mobility status PT Visit Diagnosis: Muscle weakness (generalized) (M62.81);Difficulty in walking, not elsewhere classified (R26.2)     Time: 1610-9604 PT Time Calculation (min) (ACUTE ONLY): 20 min  Charges:  $Gait Training: 8-22 mins                    G Codes:  Functional Assessment Tool Used: AM-PAC 6 Clicks Basic Mobility;Clinical judgement Functional Limitation: Mobility: Walking and moving around Mobility: Walking and Moving Around Current Status (V4098): At least 40 percent but less than 60 percent impaired, limited or restricted Mobility: Walking and Moving Around Goal Status [phone]): At least 1 percent but less than 20 percent impaired, limited or restricted    Benjamine Mola B. Migdalia Dk PT, DPT Acute Rehabilitation  [phone] Pager [phone]     [place] 02/19/2017, 10:23 AM

## 2017-02-19 NOTE — Progress Notes (Signed)
Orders received for pt discharge.  Discharge summary printed and reviewed with pt.  Explained medication regimen, and pt had no further questions at this time.  IV removed and site remains clean, dry, intact.  Telemetry removed.  Pt in stable condition and awaiting transport. 

## 2017-02-19 NOTE — Care Management Note (Signed)
Case Management Note  Patient Details  Name: [name] MRN: [id number] Date of Birth: Jul 26, 1923  Subjective/Objective:   Syncope                Action/Plan: Patient lives at home alone; PCP: Chesley Noon, MD; has private insurance with [facility] with prescription drug coverage; pharmacy of choice is [facility]; CM talked to her granddaughter [name] via telephone; pt is to return home at discharge; [facility] choice offered, [name] chose [facility]); [name] with [facility] called for arrangements; [name] stated that she checks on patient daily but will provide more assistance; she stated that she has good days and bad days; also her great grand son 6yrs old stays with her at times; Her family members live close by, sister in law lives next door, son [name] lives 2.5 miles away; DME- she has a walker at home. Patient stated that her sister [name] is to provide transportation home.  Expected Discharge Date:  02/19/17               Expected Discharge Plan:  [place]  Discharge planning Services  CM Consult  Choice offered to:  Patient, [facility] POA / Guardian   HH Arranged:  RN, PT, OT, Nurse's Aide, Social Work [employer] Agency:  [employer] (now [facility])  Status of Service:  In process, will continue to follow  [name] [phone] 02/19/2017, 11:01 AM

## 2017-02-19 NOTE — Discharge Summary (Addendum)
Physician Discharge Summary  [name] FIE:[id number] DOB: 04/23/1924 DOA: 02/17/2017  PCP: Chesley Noon, MD  Admit date: 02/17/2017 Discharge date: 02/19/2017   Recommendations for Outpatient Follow-Up:   1. Home health 2. 24 hour supervision   Discharge Diagnosis:   Active Problems:   HLD (hyperlipidemia)   Neuropathy (HCC)   Coronary artery disease   Atrial fibrillation, permanent   Diabetes mellitus type II, non insulin dependent (HCC)   HTN (hypertension)   Chronic anticoagulation for A fib, CHA2DS2VASc = 6   Syncope   Discharge disposition:  Home.  Discharge Condition: Improved.  Diet recommendation: Low sodium, heart healthy.  Wound care: None.   History of Present Illness:   [name] is a 83 y.o. female with a past medical history of hypertension, coronary artery disease status post stent in the remote past, history of atrial fibrillation on anticoagulation, history of type 2 diabetes on oral medications, previous history of postural hypotension, previous history of syncope and pause. Seen by electrophysiologist last year and syncope was thought to be vagally mediated. Not considered a candidate for pacemaker at that time. Patient is not a very good historian. She doesn't remember the events of this morning. She tells me that she was feeling quite well yesterday. This morning when she woke up she was feeling well. She went to her neighbor's house apparently to request them to get medications from pharmacy. But this information may not be accurate. She came back home and apparently passed out. Doesn't remember the events of this episode. She denies any chest pain, shortness of breath or lightheadedness prior to this episode. She does use a walker to ambulate and did use the walker this morning. She did have a urinary incontinence associated with the syncope. No tongue bites. No headaches. No vision disturbances. No seizure type activity that she can  recall. No weakness on any one side of her body. No tingling or numbness. Denies any dysuria per se, but has to go to the bathroom frequently which is chronic for her. Denies any diarrhea. No fever, no chills.   Hospital Course by Problem:   Syncope vs falls from neuropathy -tele: rate controlled a fib -echo similar to previous -EEG normal PT/OT consult: home health with supervision Orthostatics negative  A fib -chronic -eliquis  DM -resume home meds upon d/c -neurontin- lower dose  Hypokalemia -replete      Medical Consultants:    None.   Discharge Exam:   Vitals:   02/18/17 2341 02/19/17 0447  BP: (!) 150/64 126/61  Pulse:  95  Resp:  18  Temp:  97.6 F (36.4 C)   Vitals:   02/18/17 1210 02/18/17 2028 02/18/17 2341 02/19/17 0447  BP: (!) 118/58 (!) 135/56 (!) 150/64 126/61  Pulse: 82 92  95  Resp:    18  Temp: 97.8 F (36.6 C) 97.7 F (36.5 C)  97.6 F (36.4 C)  TempSrc: Oral Oral  Oral  SpO2:  100%  95%  Weight:    75.9 kg (167 lb 6.4 oz)  Height:        Gen:  NAD    The results of significant diagnostics from this hospitalization (including imaging, microbiology, ancillary and laboratory) are listed below for reference.     Procedures and Diagnostic Studies:   Dg Chest 2 View  Result Date: 02/17/2017 CLINICAL DATA:  Chest pain after fall at home today. EXAM: CHEST  2 VIEW COMPARISON:  Radiographs of March 10, 2016.  FINDINGS: The heart size and mediastinal contours are within normal limits. Both lungs are clear. Atherosclerosis of thoracic aorta is noted. No pneumothorax or pleural effusion is noted. The visualized skeletal structures are unremarkable. IMPRESSION: No active cardiopulmonary disease.  Aortic atherosclerosis. Electronically Signed   By: Marijo Conception, M.D.   On: 02/17/2017 17:32   Ct Head Wo Contrast  Result Date: 02/17/2017 CLINICAL DATA:  Syncope and fall today with head injury. Suspected loss of consciousness.  History of atrial fibrillation on anticoagulation. Initial encounter. EXAM: CT HEAD WITHOUT CONTRAST CT CERVICAL SPINE WITHOUT CONTRAST TECHNIQUE: Multidetector CT imaging of the head and cervical spine was performed following the standard protocol without intravenous contrast. Multiplanar CT image reconstructions of the cervical spine were also generated. COMPARISON:  10/18/2016 FINDINGS: CT HEAD FINDINGS Brain: There is no evidence of acute infarct, intracranial hemorrhage, mass, midline shift, or extra-axial fluid collection. Extensive cerebral white matter hypodensities are unchanged and nonspecific but compatible with chronic small vessel ischemic disease. Vascular: Calcified atherosclerosis at the skullbase. No hyperdense vessel. Skull: No fracture or focal osseous lesion. Sinuses/Orbits: Prior right-sided sinus surgery. Chronic right maxillary sinusitis. Clear mastoid air cells. Bilateral cataract extraction. Other: None. CT CERVICAL SPINE FINDINGS Alignment: Trace anterolisthesis of C7 on T1, likely degenerative and facet mediated. Skull base and vertebrae: No acute fracture or destructive osseous process. Soft tissues and spinal canal: No prevertebral fluid or swelling. No visible canal hematoma. Disc levels: Moderate anterior vertebral spurring in the lower cervical spine. Overall preserved disc space heights. Moderately advanced multilevel cervical and upper thoracic facet arthrosis. Upper chest: Clear lung apices. Similar appearance of small partially calcified right thyroid nodules. Other: None. IMPRESSION: 1. No evidence of acute intracranial abnormality. 2. Extensive chronic small vessel ischemic disease. 3. No acute fracture or traumatic subluxation identified in the cervical spine. Electronically Signed   By: Logan Bores M.D.   On: 02/17/2017 15:31   Ct Cervical Spine Wo Contrast  Result Date: 02/17/2017 CLINICAL DATA:  Syncope and fall today with head injury. Suspected loss of consciousness.  History of atrial fibrillation on anticoagulation. Initial encounter. EXAM: CT HEAD WITHOUT CONTRAST CT CERVICAL SPINE WITHOUT CONTRAST TECHNIQUE: Multidetector CT imaging of the head and cervical spine was performed following the standard protocol without intravenous contrast. Multiplanar CT image reconstructions of the cervical spine were also generated. COMPARISON:  10/18/2016 FINDINGS: CT HEAD FINDINGS Brain: There is no evidence of acute infarct, intracranial hemorrhage, mass, midline shift, or extra-axial fluid collection. Extensive cerebral white matter hypodensities are unchanged and nonspecific but compatible with chronic small vessel ischemic disease. Vascular: Calcified atherosclerosis at the skullbase. No hyperdense vessel. Skull: No fracture or focal osseous lesion. Sinuses/Orbits: Prior right-sided sinus surgery. Chronic right maxillary sinusitis. Clear mastoid air cells. Bilateral cataract extraction. Other: None. CT CERVICAL SPINE FINDINGS Alignment: Trace anterolisthesis of C7 on T1, likely degenerative and facet mediated. Skull base and vertebrae: No acute fracture or destructive osseous process. Soft tissues and spinal canal: No prevertebral fluid or swelling. No visible canal hematoma. Disc levels: Moderate anterior vertebral spurring in the lower cervical spine. Overall preserved disc space heights. Moderately advanced multilevel cervical and upper thoracic facet arthrosis. Upper chest: Clear lung apices. Similar appearance of small partially calcified right thyroid nodules. Other: None. IMPRESSION: 1. No evidence of acute intracranial abnormality. 2. Extensive chronic small vessel ischemic disease. 3. No acute fracture or traumatic subluxation identified in the cervical spine. Electronically Signed   By: Logan Bores M.D.   On: 02/17/2017 15:31  Labs:   Basic Metabolic Panel:  Recent Labs Lab 02/17/17 1441 02/18/17 0657 02/18/17 1520  NA 139 139  --   K 3.7 3.4*  --   CL 103  104  --   CO2 21* 27  --   GLUCOSE 176* 110*  --   BUN 16 11  --   CREATININE 0.95 0.92  --   CALCIUM 9.3 9.0  --   MG  --   --  1.9   GFR Estimated Creatinine Clearance: 38.8 mL/min (by C-G formula based on SCr of 0.92 mg/dL). Liver Function Tests:  Recent Labs Lab 02/17/17 1441  AST 32  ALT 15  ALKPHOS 61  BILITOT 1.2  PROT 7.1  ALBUMIN 3.8   No results for input(s): LIPASE, AMYLASE in the last 168 hours. No results for input(s): AMMONIA in the last 168 hours. Coagulation profile  Recent Labs Lab 02/17/17 1441  INR 1.10    CBC:  Recent Labs Lab 02/17/17 1441 02/18/17 0657  WBC 6.7 7.0  HGB 14.3 13.4  HCT 41.4 38.8  MCV 89.2 89.8  PLT 215 195   Cardiac Enzymes:  Recent Labs Lab 02/17/17 2024 02/18/17 0140 02/18/17 0657  TROPONINI <0.03 <0.03 <0.03   BNP: Invalid input(s): POCBNP CBG:  Recent Labs Lab 02/18/17 0639 02/18/17 0744 02/18/17 1206 02/18/17 2121 02/19/17 0446  GLUCAP 104* 120* 186* 138* 129*   D-Dimer No results for input(s): DDIMER in the last 72 hours. Hgb A1c No results for input(s): HGBA1C in the last 72 hours. Lipid Profile No results for input(s): CHOL, HDL, LDLCALC, TRIG, CHOLHDL, LDLDIRECT in the last 72 hours. Thyroid function studies No results for input(s): TSH, T4TOTAL, T3FREE, THYROIDAB in the last 72 hours.  Invalid input(s): FREET3 Anemia work up No results for input(s): VITAMINB12, FOLATE, FERRITIN, TIBC, IRON, RETICCTPCT in the last 72 hours. Microbiology No results found for this or any previous visit (from the past 240 hour(s)).   Discharge Instructions:   Discharge Instructions    Diet - low sodium heart healthy    Complete by:  As directed    Diet Carb Modified    Complete by:  As directed    Discharge instructions    Complete by:  As directed    24 hour supervision Home health   Increase activity slowly    Complete by:  As directed      Allergies as of 02/19/2017      Reactions    Amoxicillin Other (See Comments)   UNKNOWN      Medication List    TAKE these medications   alendronate 70 MG tablet Commonly known as:  FOSAMAX TAKE 1 TABLET BY MOUTH EVERY 7 DAYS ON TUESDAYS WITH A FULL GLASS OF WATER & ON AN EMPTY STOMACH What changed:  See the new instructions.   amLODipine 5 MG tablet Commonly known as:  NORVASC Take 5 mg by mouth at bedtime.   apixaban 5 MG Tabs tablet Commonly known as:  ELIQUIS Take 1 tablet (5 mg total) by mouth 2 (two) times daily.   atorvastatin 20 MG tablet Commonly known as:  LIPITOR Take 20 mg by mouth daily.   CALCIUM 500/D PO Take 1 tablet by mouth daily.   clorazepate 3.75 MG tablet Commonly known as:  TRANXENE Take 3.75 mg by mouth 2 (two) times daily as needed for anxiety.   fluticasone 50 MCG/ACT nasal spray Commonly known as:  FLONASE Place 1 spray into both nostrils daily as needed for  allergies.   gabapentin 600 MG tablet Commonly known as:  NEURONTIN Take 0.5 tablets (300 mg total) by mouth 2 (two) times daily. What changed:  how much to take   glipiZIDE 5 MG 24 hr tablet Commonly known as:  GLUCOTROL XL Take 5 mg by mouth every evening.   LUMIGAN 0.01 % Soln Generic drug:  bimatoprost Place 1 drop into both eyes at bedtime.   MULTIVITAMIN PO Take 1 tablet by mouth daily.   PRESERVISION AREDS PO Take 1 capsule by mouth 2 (two) times daily.   NITROSTAT 0.4 MG SL tablet Generic drug:  nitroGLYCERIN Place 0.4 mLs under the tongue every 5 (five) minutes as needed for chest pain (Max 3 doses).   ondansetron 8 MG tablet Commonly known as:  ZOFRAN Take 1 tablet (8 mg total) by mouth every 8 (eight) hours as needed for nausea or vomiting.   oxybutynin 5 MG tablet Commonly known as:  DITROPAN Take 5 mg by mouth daily.   traMADol 50 MG tablet Commonly known as:  ULTRAM Take 1 tablet (50 mg total) by mouth every 12 (twelve) hours as needed for moderate pain. What changed:  how much to take  when to  take this      Follow-up Information    Chesley Noon, MD Follow up in 1 week(s).   Specialty:  Family Medicine Contact information: [place] [address] [phone]            Time coordinating discharge: 35 min  Signed:  Steffan Caniglia Alison Stalling   [facility] 02/19/2017, 11:01 AM

## 2017-07-08 ENCOUNTER — Ambulatory Visit: Payer: Medicare Other | Admitting: Physician Assistant

## 2017-09-26 ENCOUNTER — Other Ambulatory Visit: Payer: Self-pay

## 2017-09-26 ENCOUNTER — Emergency Department (HOSPITAL_COMMUNITY): Payer: Medicare Other

## 2017-09-26 ENCOUNTER — Encounter (HOSPITAL_COMMUNITY): Payer: Self-pay | Admitting: Emergency Medicine

## 2017-09-26 ENCOUNTER — Emergency Department (HOSPITAL_COMMUNITY)
Admission: EM | Admit: 2017-09-26 | Discharge: 2017-09-26 | Disposition: A | Discharge status: Home / Self Care | Payer: Medicare Other | Attending: Emergency Medicine | Admitting: Emergency Medicine

## 2017-09-26 DIAGNOSIS — I129 Hypertensive chronic kidney disease with stage 1 through stage 4 chronic kidney disease, or unspecified chronic kidney disease: Secondary | ICD-10-CM | POA: Insufficient documentation

## 2017-09-26 DIAGNOSIS — Z7984 Long term (current) use of oral hypoglycemic drugs: Secondary | ICD-10-CM | POA: Insufficient documentation

## 2017-09-26 DIAGNOSIS — N183 Chronic kidney disease, stage 3 (moderate): Secondary | ICD-10-CM | POA: Insufficient documentation

## 2017-09-26 DIAGNOSIS — E1122 Type 2 diabetes mellitus with diabetic chronic kidney disease: Secondary | ICD-10-CM | POA: Diagnosis not present

## 2017-09-26 DIAGNOSIS — E860 Dehydration: Secondary | ICD-10-CM | POA: Diagnosis not present

## 2017-09-26 DIAGNOSIS — Z9049 Acquired absence of other specified parts of digestive tract: Secondary | ICD-10-CM | POA: Diagnosis not present

## 2017-09-26 DIAGNOSIS — Z79899 Other long term (current) drug therapy: Secondary | ICD-10-CM | POA: Diagnosis not present

## 2017-09-26 DIAGNOSIS — Z8522 Personal history of malignant neoplasm of nasal cavities, middle ear, and accessory sinuses: Secondary | ICD-10-CM | POA: Diagnosis not present

## 2017-09-26 DIAGNOSIS — I2510 Atherosclerotic heart disease of native coronary artery without angina pectoris: Secondary | ICD-10-CM | POA: Diagnosis not present

## 2017-09-26 DIAGNOSIS — J449 Chronic obstructive pulmonary disease, unspecified: Secondary | ICD-10-CM | POA: Insufficient documentation

## 2017-09-26 DIAGNOSIS — Z7901 Long term (current) use of anticoagulants: Secondary | ICD-10-CM | POA: Insufficient documentation

## 2017-09-26 DIAGNOSIS — F0390 Unspecified dementia without behavioral disturbance: Secondary | ICD-10-CM | POA: Diagnosis not present

## 2017-09-26 DIAGNOSIS — R55 Syncope and collapse: Secondary | ICD-10-CM | POA: Diagnosis not present

## 2017-09-26 DIAGNOSIS — Z955 Presence of coronary angioplasty implant and graft: Secondary | ICD-10-CM | POA: Diagnosis not present

## 2017-09-26 DIAGNOSIS — R42 Dizziness and giddiness: Secondary | ICD-10-CM | POA: Diagnosis present

## 2017-09-26 LAB — CBC WITH DIFFERENTIAL/PLATELET
BASOS PCT: 1 %
Basophils Absolute: 0 10*3/uL (ref 0.0–0.1)
EOS ABS: 0.1 10*3/uL (ref 0.0–0.7)
Eosinophils Relative: 1 %
HEMATOCRIT: 41.4 % (ref 36.0–46.0)
HEMOGLOBIN: 14 g/dL (ref 12.0–15.0)
Lymphocytes Relative: 22 %
Lymphs Abs: 1.3 10*3/uL (ref 0.7–4.0)
MCH: 31.5 pg (ref 26.0–34.0)
MCHC: 33.8 g/dL (ref 30.0–36.0)
MCV: 93.2 fL (ref 78.0–100.0)
MONOS PCT: 8 %
Monocytes Absolute: 0.4 10*3/uL (ref 0.1–1.0)
NEUTROS ABS: 4 10*3/uL (ref 1.7–7.7)
NEUTROS PCT: 68 %
Platelets: 199 10*3/uL (ref 150–400)
RBC: 4.44 MIL/uL (ref 3.87–5.11)
RDW: 13.8 % (ref 11.5–15.5)
WBC: 5.8 10*3/uL (ref 4.0–10.5)

## 2017-09-26 LAB — URINALYSIS, ROUTINE W REFLEX MICROSCOPIC
BILIRUBIN URINE: NEGATIVE
GLUCOSE, UA: NEGATIVE mg/dL
HGB URINE DIPSTICK: NEGATIVE
Ketones, ur: NEGATIVE mg/dL
LEUKOCYTES UA: NEGATIVE
NITRITE: POSITIVE — AB
PROTEIN: NEGATIVE mg/dL
Specific Gravity, Urine: 1.009 (ref 1.005–1.030)
Squamous Epithelial / LPF: NONE SEEN
pH: 7 (ref 5.0–8.0)

## 2017-09-26 LAB — I-STAT TROPONIN, ED: TROPONIN I, POC: 0 ng/mL (ref 0.00–0.08)

## 2017-09-26 LAB — COMPREHENSIVE METABOLIC PANEL
ALK PHOS: 67 U/L (ref 38–126)
ALT: 31 U/L (ref 14–54)
ANION GAP: 12 (ref 5–15)
AST: 25 U/L (ref 15–41)
Albumin: 3.8 g/dL (ref 3.5–5.0)
BUN: 12 mg/dL (ref 6–20)
CALCIUM: 9.2 mg/dL (ref 8.9–10.3)
CO2: 25 mmol/L (ref 22–32)
CREATININE: 1.07 mg/dL — AB (ref 0.44–1.00)
Chloride: 103 mmol/L (ref 101–111)
GFR calc non Af Amer: 44 mL/min — ABNORMAL LOW (ref >60)
GFR, EST AFRICAN AMERICAN: 51 mL/min — AB (ref >60)
Glucose, Bld: 117 mg/dL — ABNORMAL HIGH (ref 65–99)
Potassium: 4.1 mmol/L (ref 3.5–5.1)
SODIUM: 140 mmol/L (ref 135–145)
Total Bilirubin: 1.1 mg/dL (ref 0.3–1.2)
Total Protein: 7.1 g/dL (ref 6.5–8.1)

## 2017-09-26 MED ORDER — ACETAMINOPHEN 325 MG PO TABS
650.0000 mg | ORAL_TABLET | Freq: Once | ORAL | Status: AC
  Administered 2017-09-26: 650 mg via ORAL
  Filled 2017-09-26: qty 2

## 2017-09-26 MED ORDER — SODIUM CHLORIDE 0.9 % IV BOLUS (SEPSIS)
500.0000 mL | Freq: Once | INTRAVENOUS | Status: AC
  Administered 2017-09-26: 500 mL via INTRAVENOUS

## 2017-09-26 NOTE — ED Notes (Signed)
D/c reviewed with patient w/teach back

## 2017-09-26 NOTE — ED Provider Notes (Signed)
[name] EMERGENCY DEPARTMENT Provider Note   CSN: [id number] Arrival date & time: 09/26/17  1018     History   Chief Complaint Chief Complaint  Patient presents with  . Dizziness    HPI [name] is a 84 y.o. female.  The history is provided by the patient. No language interpreter was used.  Dizziness  [name] is a 84 y.o. female who presents to the Emergency Department complaining of near syncope, dizziness.  She presents from home for evaluation of a near syncopal episode with associated dizziness that happened this morning.  She was sitting on the commode and having a bowel movement when she felt like she was dizzy and might pass out.  Symptoms lasted for a few minutes.  She was concerned that her blood pressure was high at that time.  The dizziness has since resolved but she does have a mild associated headache now.  She denies any associated chest pain, shortness of breath, nausea vomiting, abdominal pain.  She lives at home alone and ambulates with a walker.  No recent illnesses.  She does take Eliquis for history of atrial fibrillation.  Past Medical History:  Diagnosis Date  . 1st degree AV block   . Acute renal failure ([place])    due to dehydration-Sept 2009; Normal creat 1.2 on fu 05/13/2009  . Allergic rhinitis   . Anemia    NOS chronic disease. Hgb 11.6gm% 04/14/2009  . Bradycardia   . CAD (coronary artery disease)    a. s/p DES mLAD 2005. b. Cutting balloon angioplasty for ISR 2006. c. LHC 08/2010 nonobstructive. d. Myoview 06/2012: small anteroapical infarct/mod inferolat wall infarct but no ischemia, EF 75%.  . Cancer ([place])    on nose and had it removed  . Chronic anticoagulation   . CKD (chronic kidney disease), stage III ([place])   . Dementia    slight  . Diabetes mellitus type II   . Diverticulitis of colon   . DM neuropathy, type II diabetes mellitus ([place])   . GERD (gastroesophageal reflux disease)   . HTN (hypertension)     . Hx of colonoscopy   . Hyperlipidemia   . IBS (irritable bowel syndrome)   . Multinodular goiter   . Obesity    BMI-37  . Orthostatic hypotension   . Osteoarthritis    lower back  . Osteoporosis   . Pneumonia   . PVC's (premature ventricular contractions)    a. seen on telemetry 02/2016.  Marland Kitchen RBBB   . Rheumatoid arthritis ([place])   . Spinal stenosis    djd lumbar    Patient Active Problem List   Diagnosis Date Noted  . Syncope 02/17/2017  . PVC's (premature ventricular contractions) 03/07/2016  . Depression 03/06/2016  . Chest pain at rest 03/06/2016  . Unstable angina ([place]) 10/24/2015  . Chronic anticoagulation for A fib, CHA2DS2VASc = 6 12/18/2014  . Chest pain, negative MI, negative Nuc, may be GI 12/17/2014  . Diabetes mellitus type II, non insulin dependent ([place])   . HTN (hypertension)   . Tachycardia 08/07/2011  . Atrial fibrillation, permanent 06/25/2011  . Near Syncope 06/06/2011  . Coronary artery disease 04/05/2011  . Bradycardia 12/09/2009  . ACUTE BRONCHITIS 06/17/2009  . COPD, moderate ([place]) 06/17/2009  . PULMONARY NODULE, RIGHT UPPER LOBE 06/07/2009  . SHORTNESS OF BREATH (SOB) 06/07/2009  . POSTURAL HYPOTENSION 05/23/2009  . DEHYDRATION 05/13/2009  . ABDOMINAL PAIN, LOWER 01/21/2009  . HOARSENESS, CHRONIC 12/03/2007  .  GOITER 09/30/2007  . DIABETIC PERIPHERAL NEUROPATHY 09/30/2007  . OBESITY 09/30/2007  . HIATAL HERNIA 09/30/2007  . OSTEOARTHRITIS 09/30/2007  . SPINAL STENOSIS 09/30/2007  . PERIPHERAL EDEMA 09/30/2007  . HLD (hyperlipidemia) 02/12/2007  . ANEMIA-NOS 02/12/2007  . Neuropathy ([place]) 02/12/2007  . Allergic rhinitis, cause unspecified 02/12/2007  . GERD 02/12/2007  . DIVERTICULOSIS, COLON 02/12/2007    Past Surgical History:  Procedure Laterality Date  . ABDOMINAL HYSTERECTOMY    . bilateral arthroscopic knee surgery    . carpel tunnel wrist rt    . CHOLECYSTECTOMY    . CORONARY ANGIOPLASTY     stent  . NASAL SINUS SURGERY      x2  . stent surgery      OB History    No data available       Home Medications    Prior to Admission medications   Medication Sig Start Date End Date Taking? Authorizing Provider  amLODipine (NORVASC) 5 MG tablet Take 5 mg by mouth at bedtime.  03/20/16  Yes [provider]  apixaban (ELIQUIS) 5 MG TABS tablet Take 1 tablet (5 mg total) by mouth 2 (two) times daily. 01/31/15  Yes Weaver, Scott T, PA-C  atorvastatin (LIPITOR) 20 MG tablet Take 20 mg by mouth daily.   Yes [provider]  Calcium Carbonate-Vitamin D (CALCIUM 500/D PO) Take 1 tablet by mouth daily.    Yes [provider]  fluticasone (FLONASE) 50 MCG/ACT nasal spray Place 1 spray into both nostrils daily as needed for allergies.  01/17/16  Yes [provider]  gabapentin (NEURONTIN) 600 MG tablet Take 0.5 tablets (300 mg total) by mouth 2 (two) times daily. 02/19/17  Yes Vann, Jessica U, DO  glipiZIDE (GLUCOTROL XL) 5 MG 24 hr tablet Take 5 mg by mouth every evening.    Yes [provider]  LUMIGAN 0.01 % SOLN Place 1 drop into both eyes at bedtime. 02/11/17  Yes [provider]  Multiple Vitamins-Minerals (MULTIVITAMIN PO) Take 1 tablet by mouth daily.   Yes [provider]  Multiple Vitamins-Minerals (PRESERVISION AREDS PO) Take 1 capsule by mouth 2 (two) times daily.   Yes [provider]  oxybutynin (DITROPAN) 5 MG tablet Take 5 mg by mouth daily.   Yes [provider]  alendronate (FOSAMAX) 70 MG tablet TAKE 1 TABLET BY MOUTH EVERY 7 DAYS ON TUESDAYS WITH A FULL GLASS OF WATER & ON AN EMPTY STOMACH Patient not taking: Reported on 09/26/2017 02/22/16   Fay Records, MD  nitroGLYCERIN (NITROSTAT) 0.4 MG SL tablet Place 0.4 mLs under the tongue every 5 (five) minutes as needed for chest pain (Max 3 doses).  08/16/14   [provider]  ondansetron (ZOFRAN) 8 MG tablet Take 1 tablet (8 mg total) by mouth every 8 (eight) hours as needed for  nausea or vomiting. 12/30/15   Daleen Bo, MD  traMADol (ULTRAM) 50 MG tablet Take 1 tablet (50 mg total) by mouth every 12 (twelve) hours as needed for moderate pain. Patient not taking: Reported on 09/26/2017 02/19/17   Geradine Girt, DO    Family History Family History  Problem Relation Age of Onset  . Heart attack Father   . Breast cancer Sister   . Heart disease Brother   . Heart attack Son   . Heart attack Daughter   . Colon cancer Neg Hx   . Stroke Neg Hx     Social History Social History   Tobacco Use  .  Smoking status: Never Smoker  . Smokeless tobacco: Never Used  Substance Use Topics  . Alcohol use: No  . Drug use: No     Allergies   Amoxicillin   Review of Systems Review of Systems  Neurological: Positive for dizziness.  All other systems reviewed and are negative.    Physical Exam Updated Vital Signs BP (!) 153/76   Pulse 83   Temp 97.9 F (36.6 C) (Oral)   Resp (!) 25   SpO2 96%   Physical Exam  Constitutional: She is oriented to person, place, and time. She appears well-developed and well-nourished.  HENT:  Head: Normocephalic and atraumatic.  Eyes: EOM are normal. Pupils are equal, round, and reactive to light.  Cardiovascular: Regular rhythm.  No murmur heard. Irregularly irregular  Pulmonary/Chest: Effort normal and breath sounds normal. No respiratory distress.  Abdominal: Soft. There is no tenderness. There is no rebound and no guarding.  Musculoskeletal: She exhibits no edema or tenderness.  Neurological: She is alert and oriented to person, place, and time.  5 out of 5 strength in all 4 extremities with sensation to light touch intact in all 4 extremity's.  Cranial nerves grossly intact.  Skin: Skin is warm and dry.  Psychiatric: She has a normal mood and affect. Her behavior is normal.  Nursing note and vitals reviewed.    ED Treatments / Results  Labs (all labs ordered are listed, but only abnormal results are  displayed) Labs Reviewed  COMPREHENSIVE METABOLIC PANEL - Abnormal; Notable for the following components:      Result Value   Glucose, Bld 117 (*)    Creatinine, Ser 1.07 (*)    GFR calc non Af Amer 44 (*)    GFR calc Af Amer 51 (*)    All other components within normal limits  URINALYSIS, ROUTINE W REFLEX MICROSCOPIC - Abnormal; Notable for the following components:   APPearance HAZY (*)    Nitrite POSITIVE (*)    Bacteria, UA FEW (*)    All other components within normal limits  URINE CULTURE  CBC WITH DIFFERENTIAL/PLATELET  I-STAT TROPONIN, ED    EKG  EKG Interpretation  Date/Time:  Thursday September 26 2017 13:28:57 EST Ventricular Rate:  86 PR Interval:    QRS Duration: 133 QT Interval:  411 QTC Calculation: 492 R Axis:   74 Text Interpretation:  Sinus rhythm Prolonged PR interval Right bundle branch block Confirmed by Quintella Reichert [phone]) on 09/26/2017 1:38:11 PM       Radiology Dg Chest 2 View  Result Date: 09/26/2017 CLINICAL DATA:  Near syncope, dizziness EXAM: CHEST - 2 VIEW COMPARISON:  02/17/2017 FINDINGS: The heart size and mediastinal contours are within normal limits. Both lungs are clear. The visualized skeletal structures are unremarkable. IMPRESSION: No active cardiopulmonary disease. Electronically Signed   By: Kathreen Devoid   On: 09/26/2017 11:16   Ct Head Wo Contrast  Result Date: 09/26/2017 CLINICAL DATA:  84 year old female with acute dizziness and vertigo today. EXAM: CT HEAD WITHOUT CONTRAST TECHNIQUE: Contiguous axial images were obtained from the base of the skull through the vertex without intravenous contrast. COMPARISON:  02/17/2017 and prior CTs FINDINGS: Brain: No evidence of acute infarction, hemorrhage, hydrocephalus, extra-axial collection or mass lesion/mass effect. Atrophy, severe chronic small-vessel white matter ischemic changes and remote RIGHT cerebellar infarct Vascular: Atherosclerotic calcifications noted. Skull: No acute abnormality.  Sinuses/Orbits: Chronic RIGHT maxillary sinus changes again noted. Other: None IMPRESSION: 1. No evidence of acute intracranial abnormality. 2. Atrophy, severe  chronic small-vessel white matter ischemic changes and remote RIGHT cerebellar infarct. Electronically Signed   By: Margarette Canada M.D.   On: 09/26/2017 11:33    Procedures Procedures (including critical care time)  Medications Ordered in ED Medications  sodium chloride 0.9 % bolus 500 mL (0 mLs Intravenous Stopped 09/26/17 1429)  acetaminophen (TYLENOL) tablet 650 mg (650 mg Oral Given 09/26/17 1319)     Initial Impression / Assessment and Plan / ED Course  I have reviewed the triage vital signs and the nursing notes.  Pertinent labs & imaging results that were available during my care of the patient were reviewed by me and considered in my medical decision making (see chart for details).     Patient here for evaluation following a near syncopal episode with dizziness while on the commode.  She is well-appearing on examination and in no acute distress.  Presentation is not consistent with ACS, CVA, CHF, PE.  She is able to ambulate the department without difficulty.  Question if she had a vasovagal episode while at home.  She was mildly dehydrated on initial evaluation with orthostasis.  She was provided with IV fluids and this improved.  UA is positive for nitrites but no other evidence of UTI, will send culture -patient is currently astigmatic.  Final Clinical Impressions(s) / ED Diagnoses   Final diagnoses:  Dehydration  Near syncope    ED Discharge Orders    None       Quintella Reichert, MD 09/26/17 [phone]

## 2017-09-26 NOTE — ED Notes (Signed)
Patient transported to X-ray 

## 2017-09-26 NOTE — ED Notes (Signed)
Pt ambulated with cane. Pt denies dizziness or lightheadedness.

## 2017-09-26 NOTE — ED Triage Notes (Signed)
Pt presents from home with ems for complaints of brief dizziness that has subsided while using the bathroom today. Pt also complains of a headache. Hypertensive with ems, reports not taking her bp meds. Alert oriented and ambulatory with walker. No focal neuro deficits present.

## 2017-09-29 LAB — URINE CULTURE: Culture: >=100000 — AB

## 2017-09-30 ENCOUNTER — Telehealth: Payer: Self-pay | Admitting: Emergency Medicine

## 2017-09-30 NOTE — Telephone Encounter (Signed)
Post ED Visit - Positive Culture Follow-up  Culture report reviewed by antimicrobial stewardship pharmacist:  []  Elenor Quinones, Pharm.D. []  Heide Guile, Pharm.D., BCPS AQ-ID []  Parks Neptune, Pharm.D., BCPS [x]  Alycia Rossetti, Pharm.D., BCPS []  [place], Pharm.D., BCPS, AAHIVP []  Legrand Como, Pharm.D., BCPS, AAHIVP []  Salome Arnt, PharmD, BCPS []  Jalene Mullet, PharmD []  Vincenza Hews, PharmD, BCPS  Positive urine culture asymptomatic,  and no further patient follow-up is required at this time.  Hazle Nordmann 09/30/2017, 3:23 PM

## 2017-10-20 ENCOUNTER — Emergency Department (HOSPITAL_COMMUNITY): Payer: Medicare Other

## 2017-10-20 ENCOUNTER — Encounter (HOSPITAL_COMMUNITY): Payer: Self-pay | Admitting: Emergency Medicine

## 2017-10-20 ENCOUNTER — Observation Stay (HOSPITAL_COMMUNITY)
Admission: EM | Admit: 2017-10-20 | Discharge: 2017-10-22 | Disposition: A | Discharge status: Skilled Nursing Facility | Payer: Medicare Other | Attending: Internal Medicine | Admitting: Internal Medicine

## 2017-10-20 DIAGNOSIS — J449 Chronic obstructive pulmonary disease, unspecified: Secondary | ICD-10-CM | POA: Diagnosis not present

## 2017-10-20 DIAGNOSIS — Z7984 Long term (current) use of oral hypoglycemic drugs: Secondary | ICD-10-CM | POA: Insufficient documentation

## 2017-10-20 DIAGNOSIS — M810 Age-related osteoporosis without current pathological fracture: Secondary | ICD-10-CM | POA: Insufficient documentation

## 2017-10-20 DIAGNOSIS — E785 Hyperlipidemia, unspecified: Secondary | ICD-10-CM | POA: Diagnosis not present

## 2017-10-20 DIAGNOSIS — Y92002 Bathroom of unspecified non-institutional (private) residence single-family (private) house as the place of occurrence of the external cause: Secondary | ICD-10-CM | POA: Diagnosis not present

## 2017-10-20 DIAGNOSIS — E049 Nontoxic goiter, unspecified: Secondary | ICD-10-CM | POA: Diagnosis not present

## 2017-10-20 DIAGNOSIS — S20221A Contusion of right back wall of thorax, initial encounter: Secondary | ICD-10-CM | POA: Diagnosis not present

## 2017-10-20 DIAGNOSIS — B9620 Unspecified Escherichia coli [E. coli] as the cause of diseases classified elsewhere: Secondary | ICD-10-CM | POA: Insufficient documentation

## 2017-10-20 DIAGNOSIS — W1839XA Other fall on same level, initial encounter: Secondary | ICD-10-CM | POA: Diagnosis not present

## 2017-10-20 DIAGNOSIS — E119 Type 2 diabetes mellitus without complications: Secondary | ICD-10-CM

## 2017-10-20 DIAGNOSIS — I482 Chronic atrial fibrillation: Secondary | ICD-10-CM | POA: Diagnosis not present

## 2017-10-20 DIAGNOSIS — N183 Chronic kidney disease, stage 3 (moderate): Secondary | ICD-10-CM | POA: Diagnosis not present

## 2017-10-20 DIAGNOSIS — Z8249 Family history of ischemic heart disease and other diseases of the circulatory system: Secondary | ICD-10-CM | POA: Diagnosis not present

## 2017-10-20 DIAGNOSIS — Z803 Family history of malignant neoplasm of breast: Secondary | ICD-10-CM | POA: Insufficient documentation

## 2017-10-20 DIAGNOSIS — R55 Syncope and collapse: Secondary | ICD-10-CM | POA: Diagnosis not present

## 2017-10-20 DIAGNOSIS — I4891 Unspecified atrial fibrillation: Secondary | ICD-10-CM | POA: Diagnosis present

## 2017-10-20 DIAGNOSIS — R52 Pain, unspecified: Secondary | ICD-10-CM

## 2017-10-20 DIAGNOSIS — F329 Major depressive disorder, single episode, unspecified: Secondary | ICD-10-CM | POA: Diagnosis present

## 2017-10-20 DIAGNOSIS — Z7983 Long term (current) use of bisphosphonates: Secondary | ICD-10-CM | POA: Insufficient documentation

## 2017-10-20 DIAGNOSIS — I10 Essential (primary) hypertension: Secondary | ICD-10-CM | POA: Diagnosis present

## 2017-10-20 DIAGNOSIS — F32A Depression, unspecified: Secondary | ICD-10-CM | POA: Diagnosis present

## 2017-10-20 DIAGNOSIS — N390 Urinary tract infection, site not specified: Secondary | ICD-10-CM | POA: Insufficient documentation

## 2017-10-20 DIAGNOSIS — S20211A Contusion of right front wall of thorax, initial encounter: Secondary | ICD-10-CM

## 2017-10-20 DIAGNOSIS — E1122 Type 2 diabetes mellitus with diabetic chronic kidney disease: Secondary | ICD-10-CM | POA: Diagnosis not present

## 2017-10-20 DIAGNOSIS — I2510 Atherosclerotic heart disease of native coronary artery without angina pectoris: Secondary | ICD-10-CM | POA: Diagnosis present

## 2017-10-20 LAB — CBC WITH DIFFERENTIAL/PLATELET
Basophils Absolute: 0 10*3/uL (ref 0.0–0.1)
Basophils Relative: 1 %
Eosinophils Absolute: 0.1 10*3/uL (ref 0.0–0.7)
Eosinophils Relative: 2 %
HEMATOCRIT: 40.3 % (ref 36.0–46.0)
HEMOGLOBIN: 13 g/dL (ref 12.0–15.0)
LYMPHS ABS: 0.9 10*3/uL (ref 0.7–4.0)
LYMPHS PCT: 17 %
MCH: 30.5 pg (ref 26.0–34.0)
MCHC: 32.3 g/dL (ref 30.0–36.0)
MCV: 94.6 fL (ref 78.0–100.0)
MONOS PCT: 9 %
Monocytes Absolute: 0.5 10*3/uL (ref 0.1–1.0)
NEUTROS ABS: 3.8 10*3/uL (ref 1.7–7.7)
NEUTROS PCT: 71 %
Platelets: 194 10*3/uL (ref 150–400)
RBC: 4.26 MIL/uL (ref 3.87–5.11)
RDW: 13.7 % (ref 11.5–15.5)
WBC: 5.3 10*3/uL (ref 4.0–10.5)

## 2017-10-20 LAB — URINALYSIS, ROUTINE W REFLEX MICROSCOPIC
Bacteria, UA: NONE SEEN
Bilirubin Urine: NEGATIVE
GLUCOSE, UA: 50 mg/dL — AB
Hgb urine dipstick: NEGATIVE
KETONES UR: NEGATIVE mg/dL
Nitrite: POSITIVE — AB
PH: 7 (ref 5.0–8.0)
Protein, ur: NEGATIVE mg/dL
SPECIFIC GRAVITY, URINE: 1.045 — AB (ref 1.005–1.030)

## 2017-10-20 LAB — I-STAT TROPONIN, ED: Troponin i, poc: 0 ng/mL (ref 0.00–0.08)

## 2017-10-20 LAB — COMPREHENSIVE METABOLIC PANEL
ALK PHOS: 61 U/L (ref 38–126)
ALT: 20 U/L (ref 14–54)
ANION GAP: 11 (ref 5–15)
AST: 25 U/L (ref 15–41)
Albumin: 3.3 g/dL — ABNORMAL LOW (ref 3.5–5.0)
BILIRUBIN TOTAL: 0.7 mg/dL (ref 0.3–1.2)
BUN: 13 mg/dL (ref 6–20)
CALCIUM: 8.7 mg/dL — AB (ref 8.9–10.3)
CO2: 26 mmol/L (ref 22–32)
Chloride: 98 mmol/L — ABNORMAL LOW (ref 101–111)
Creatinine, Ser: 1.24 mg/dL — ABNORMAL HIGH (ref 0.44–1.00)
GFR calc Af Amer: 43 mL/min — ABNORMAL LOW (ref >60)
GFR, EST NON AFRICAN AMERICAN: 37 mL/min — AB (ref >60)
GLUCOSE: 257 mg/dL — AB (ref 65–99)
Potassium: 4.1 mmol/L (ref 3.5–5.1)
Sodium: 135 mmol/L (ref 135–145)
TOTAL PROTEIN: 6.1 g/dL — AB (ref 6.5–8.1)

## 2017-10-20 LAB — GLUCOSE, CAPILLARY: Glucose-Capillary: 119 mg/dL — ABNORMAL HIGH (ref 65–99)

## 2017-10-20 MED ORDER — SODIUM CHLORIDE 0.9 % IV BOLUS
500.0000 mL | Freq: Once | INTRAVENOUS | Status: AC
Start: 2017-10-20 — End: 2017-10-20
  Administered 2017-10-20: 500 mL via INTRAVENOUS

## 2017-10-20 MED ORDER — ACETAMINOPHEN 500 MG PO TABS
1000.0000 mg | ORAL_TABLET | Freq: Once | ORAL | Status: AC
  Administered 2017-10-20: 1000 mg via ORAL
  Filled 2017-10-20: qty 2

## 2017-10-20 MED ORDER — BISACODYL 5 MG PO TBEC: 5.0000 mg | DELAYED_RELEASE_TABLET | Freq: Every day | ORAL | Status: DC | PRN

## 2017-10-20 MED ORDER — APIXABAN 5 MG PO TABS
5.0000 mg | ORAL_TABLET | Freq: Two times a day (BID) | ORAL | Status: DC
  Administered 2017-10-20 – 2017-10-22 (×4): 5 mg via ORAL
  Filled 2017-10-20 (×5): qty 1

## 2017-10-20 MED ORDER — IOPAMIDOL (ISOVUE-370) INJECTION 76%
INTRAVENOUS | Status: AC
  Administered 2017-10-20: 60 mL
  Filled 2017-10-20: qty 100

## 2017-10-20 MED ORDER — FENTANYL CITRATE (PF) 100 MCG/2ML IJ SOLN
25.0000 ug | Freq: Once | INTRAMUSCULAR | Status: AC
  Administered 2017-10-20: 25 ug via INTRAVENOUS
  Filled 2017-10-20: qty 2

## 2017-10-20 MED ORDER — TRAZODONE HCL 50 MG PO TABS
50.0000 mg | ORAL_TABLET | Freq: Once | ORAL | Status: AC
  Administered 2017-10-20: 50 mg via ORAL
  Filled 2017-10-20: qty 1

## 2017-10-20 MED ORDER — SODIUM CHLORIDE 0.9 % IV BOLUS
1000.0000 mL | Freq: Once | INTRAVENOUS | Status: AC
  Administered 2017-10-20: 1000 mL via INTRAVENOUS

## 2017-10-20 MED ORDER — SODIUM CHLORIDE 0.9 % IV SOLN
1.0000 g | INTRAVENOUS | Status: DC
  Administered 2017-10-20 – 2017-10-21 (×2): 1 g via INTRAVENOUS
  Filled 2017-10-20 (×2): qty 10

## 2017-10-20 MED ORDER — ACETAMINOPHEN 325 MG PO TABS
650.0000 mg | ORAL_TABLET | Freq: Four times a day (QID) | ORAL | Status: DC | PRN
  Administered 2017-10-22: 650 mg via ORAL
  Filled 2017-10-20 (×2): qty 2

## 2017-10-20 MED ORDER — ATORVASTATIN CALCIUM 20 MG PO TABS
20.0000 mg | ORAL_TABLET | Freq: Every day | ORAL | Status: DC
  Administered 2017-10-20 – 2017-10-22 (×3): 20 mg via ORAL
  Filled 2017-10-20 (×3): qty 1

## 2017-10-20 MED ORDER — POLYETHYLENE GLYCOL 3350 17 G PO PACK: 17.0000 g | PACK | Freq: Every day | ORAL | Status: DC | PRN

## 2017-10-20 MED ORDER — OXYBUTYNIN CHLORIDE 5 MG PO TABS
5.0000 mg | ORAL_TABLET | Freq: Every day | ORAL | Status: DC
  Administered 2017-10-21 – 2017-10-22 (×2): 5 mg via ORAL
  Filled 2017-10-20 (×2): qty 1

## 2017-10-20 MED ORDER — ONDANSETRON HCL 4 MG PO TABS: 4.0000 mg | ORAL_TABLET | Freq: Four times a day (QID) | ORAL | Status: DC | PRN

## 2017-10-20 MED ORDER — TRAMADOL HCL 50 MG PO TABS
100.0000 mg | ORAL_TABLET | Freq: Three times a day (TID) | ORAL | Status: DC | PRN
  Administered 2017-10-21 – 2017-10-22 (×2): 100 mg via ORAL
  Filled 2017-10-20 (×2): qty 2

## 2017-10-20 MED ORDER — GLIPIZIDE ER 5 MG PO TB24
5.0000 mg | ORAL_TABLET | Freq: Every evening | ORAL | Status: DC
  Administered 2017-10-20: 5 mg via ORAL
  Filled 2017-10-20 (×2): qty 1

## 2017-10-20 MED ORDER — SODIUM CHLORIDE 0.9 % IV SOLN: 1.0000 g | INTRAVENOUS | Status: DC

## 2017-10-20 MED ORDER — ACETAMINOPHEN 650 MG RE SUPP: 650.0000 mg | Freq: Four times a day (QID) | RECTAL | Status: DC | PRN

## 2017-10-20 MED ORDER — LATANOPROST 0.005 % OP SOLN
1.0000 [drp] | Freq: Every day | OPHTHALMIC | Status: DC
  Administered 2017-10-20 – 2017-10-21 (×2): 1 [drp] via OPHTHALMIC
  Filled 2017-10-20: qty 2.5

## 2017-10-20 MED ORDER — AMLODIPINE BESYLATE 5 MG PO TABS
5.0000 mg | ORAL_TABLET | Freq: Every day | ORAL | Status: DC
  Administered 2017-10-20 – 2017-10-21 (×2): 5 mg via ORAL
  Filled 2017-10-20 (×2): qty 1

## 2017-10-20 MED ORDER — NITROGLYCERIN 0.4 MG SL SUBL: 0.4000 mg | SUBLINGUAL_TABLET | SUBLINGUAL | Status: DC | PRN

## 2017-10-20 MED ORDER — CYCLOBENZAPRINE HCL 10 MG PO TABS
10.0000 mg | ORAL_TABLET | Freq: Once | ORAL | Status: AC
  Administered 2017-10-20: 10 mg via ORAL
  Filled 2017-10-20: qty 1

## 2017-10-20 MED ORDER — ONDANSETRON HCL 4 MG/2ML IJ SOLN: 4.0000 mg | Freq: Four times a day (QID) | INTRAMUSCULAR | Status: DC | PRN

## 2017-10-20 NOTE — H&P (Signed)
History and Physical:    [name]   GXQ:[id number] DOB: 1924/07/12 DOA: 10/20/2017  Referring MD/provider: Dr Billy Fischer PCP: Chesley Noon, MD   Patient coming from: Home  Chief Complaint: "I passed out"  History of Present Illness:   [name] is an 84 y.o. femalewith a past medical history of hypertension, atrial fibrillation, coronary artery disease, type 2 diabetes who is being admitted for syncope.  Patient has a previous history of syncope with multiple admissions in the past which has been attributed to postural hypotension and or bradycardia and or sinus pause. Patient was seen by electrophysiologist last year and syncope was thought to be vagally mediated. Patient was not considered a candidate for pacemaker at that time.   Today patient states that she was taking a hot shower and when she came out of the shower she had syncope. She does not remember falling but when she came to she was lying by the commode. Patient states that she keeps her phone with her always in a place on the commode so she was able to call 911. Patient is not sure if she hit her head however she says she has no head pain. Instead she says she has significant right back pain and assumes that she fell on her back.  Patient states this syncopal episode was similar to previous episodes. She has unaware of when she is going to pass out. She has no sensation of darkness or faintness. She denies any chest pain, shortness of breath or lightheadedness. At present she denies any headaches. She denies losing her urine or biting her tongue. Patient states that she knew immediately where she was when she woke up and was not confused at all.  Review of systems is positive for some dysuria for the past 12 hours. No fevers or chills. No new diarrhea, melena, hematemesis or vomiting.   ED Course:  The patient was evaluated with head and cervical spine CT which were negative. All electrolytes and CBC  were within normal limits. Troponin is negative and EKGs without any acute changes.  ROS:   ROS  As noted in history of present illness   Past Medical History:   Past Medical History:  Diagnosis Date  . 1st degree AV block   . Acute renal failure ([place])    due to dehydration-Sept 2009; Normal creat 1.2 on fu 05/13/2009  . Allergic rhinitis   . Anemia    NOS chronic disease. Hgb 11.6gm% 04/14/2009  . Bradycardia   . CAD (coronary artery disease)    a. s/p DES mLAD 2005. b. Cutting balloon angioplasty for ISR 2006. c. LHC 08/2010 nonobstructive. d. Myoview 06/2012: small anteroapical infarct/mod inferolat wall infarct but no ischemia, EF 75%.  . Cancer ([place])    on nose and had it removed  . Chronic anticoagulation   . CKD (chronic kidney disease), stage III ([place])   . Dementia    slight  . Diabetes mellitus type II   . Diverticulitis of colon   . DM neuropathy, type II diabetes mellitus ([place])   . GERD (gastroesophageal reflux disease)   . HTN (hypertension)   . Hx of colonoscopy   . Hyperlipidemia   . IBS (irritable bowel syndrome)   . Multinodular goiter   . Obesity    BMI-37  . Orthostatic hypotension   . Osteoarthritis    lower back  . Osteoporosis   . Pneumonia   . PVC's (premature ventricular contractions)  a. seen on telemetry 02/2016.  Marland Kitchen RBBB   . Rheumatoid arthritis ([place])   . Spinal stenosis    djd lumbar    Past Surgical History:   Past Surgical History:  Procedure Laterality Date  . ABDOMINAL HYSTERECTOMY    . bilateral arthroscopic knee surgery    . carpel tunnel wrist rt    . CHOLECYSTECTOMY    . CORONARY ANGIOPLASTY     stent  . NASAL SINUS SURGERY     x2  . stent surgery      Social History:   Social History   Socioeconomic History  . Marital status: Married    Spouse name: Not on file  . Number of children: Not on file  . Years of education: Not on file  . Highest education level: Not on file  Occupational History  . Occupation:  retired  Scientific laboratory technician  . Financial resource strain: Not on file  . Food insecurity:    Worry: Not on file    Inability: Not on file  . Transportation needs:    Medical: Not on file    Non-medical: Not on file  Tobacco Use  . Smoking status: Never Smoker  . Smokeless tobacco: Never Used  Substance and Sexual Activity  . Alcohol use: No  . Drug use: No  . Sexual activity: Never    Birth control/protection: Post-menopausal  Lifestyle  . Physical activity:    Days per week: Not on file    Minutes per session: Not on file  . Stress: Not on file  Relationships  . Social connections:    Talks on phone: Not on file    Gets together: Not on file    Attends religious service: Not on file    Active member of club or organization: Not on file    Attends meetings of clubs or organizations: Not on file    Relationship status: Not on file  . Intimate partner violence:    Fear of current or ex partner: Not on file    Emotionally abused: Not on file    Physically abused: Not on file    Forced sexual activity: Not on file  Other Topics Concern  . Not on file  Social History Narrative  . Not on file    Allergies   Amoxicillin  Family history:   Family History  Problem Relation Age of Onset  . Heart attack Father   . Breast cancer Sister   . Heart disease Brother   . Heart attack Son   . Heart attack Daughter   . Colon cancer Neg Hx   . Stroke Neg Hx     Current Medications:   Prior to Admission medications   Medication Sig Start Date End Date Taking? Authorizing Provider  alendronate (FOSAMAX) 70 MG tablet TAKE 1 TABLET BY MOUTH EVERY 7 DAYS ON TUESDAYS WITH A FULL GLASS OF WATER & ON AN EMPTY STOMACH Patient not taking: Reported on 09/26/2017 02/22/16   Fay Records, MD  amLODipine (NORVASC) 5 MG tablet Take 5 mg by mouth at bedtime.  03/20/16   [provider]  apixaban (ELIQUIS) 5 MG TABS tablet Take 1 tablet (5 mg total) by mouth 2 (two) times daily. 01/31/15    Richardson Dopp T, PA-C  atorvastatin (LIPITOR) 20 MG tablet Take 20 mg by mouth daily.    [provider]  Calcium Carbonate-Vitamin D (CALCIUM 500/D PO) Take 1 tablet by mouth daily.     [provider]  fluticasone (FLONASE) 50 MCG/ACT nasal spray Place 1 spray into both nostrils daily as needed for allergies.  01/17/16   [provider]  gabapentin (NEURONTIN) 600 MG tablet Take 0.5 tablets (300 mg total) by mouth 2 (two) times daily. 02/19/17   Geradine Girt, DO  glipiZIDE (GLUCOTROL XL) 5 MG 24 hr tablet Take 5 mg by mouth every evening.     [provider]  LUMIGAN 0.01 % SOLN Place 1 drop into both eyes at bedtime. 02/11/17   [provider]  Multiple Vitamins-Minerals (MULTIVITAMIN PO) Take 1 tablet by mouth daily.    [provider]  Multiple Vitamins-Minerals (PRESERVISION AREDS PO) Take 1 capsule by mouth 2 (two) times daily.    [provider]  nitroGLYCERIN (NITROSTAT) 0.4 MG SL tablet Place 0.4 mLs under the tongue every 5 (five) minutes as needed for chest pain (Max 3 doses).  08/16/14   [provider]  ondansetron (ZOFRAN) 8 MG tablet Take 1 tablet (8 mg total) by mouth every 8 (eight) hours as needed for nausea or vomiting. 12/30/15   Daleen Bo, MD  oxybutynin (DITROPAN) 5 MG tablet Take 5 mg by mouth daily.    [provider]  traMADol (ULTRAM) 50 MG tablet Take 1 tablet (50 mg total) by mouth every 12 (twelve) hours as needed for moderate pain. Patient not taking: Reported on 09/26/2017 02/19/17   Geradine Girt, DO    Physical Exam:   Vitals:   10/20/17 1430 10/20/17 1500 10/20/17 1600 10/20/17 1630  BP: (!) 145/57 134/63 132/68 129/86  Pulse: 72 68 (!) 54   Resp: 18 18 18    Temp:      TempSrc:      SpO2: 98% 99% 92% 97%     Physical Exam: Blood pressure 129/86, pulse (!) 54, temperature 97.8 F (36.6 C), temperature source Oral, resp. rate 18, SpO2 97 %. heart rate varied from 54-78  while I was in the room. Gen: pleasant obese female lying flat in bedin no apparent respiratory distress. Eyes: Sclerae anicteric. Conjunctiva not injected Chest: Moderately good air entry bilaterally with no adventitious sounds.  CV: Distant, regular, no audible murmurs. Abdomen: NABS, soft, nondistended, nontender.  Extremities: No edema.  Skin: patient does have marked ecchymoses on her right upper back. She certainly has tenderness in the area to palpation. Neuro: Alert and oriented times 3; grossly nonfocal. Psych: Patient is cooperative, logical and coherent with appropriate mood and affect.  Data Review:    Labs: Basic Metabolic Panel: Recent Labs  Lab 10/20/17 1327  NA 135  K 4.1  CL 98*  CO2 26  GLUCOSE 257*  BUN 13  CREATININE 1.24*  CALCIUM 8.7*   Liver Function Tests: Recent Labs  Lab 10/20/17 1327  AST 25  ALT 20  ALKPHOS 61  BILITOT 0.7  PROT 6.1*  ALBUMIN 3.3*   No results for input(s): LIPASE, AMYLASE in the last 168 hours. No results for input(s): AMMONIA in the last 168 hours. CBC: Recent Labs  Lab 10/20/17 1327  WBC 5.3  NEUTROABS 3.8  HGB 13.0  HCT 40.3  MCV 94.6  PLT 194   Cardiac Enzymes: No results for input(s): CKTOTAL, CKMB, CKMBINDEX, TROPONINI in the last 168 hours.  BNP (last 3 results) No results for input(s): PROBNP in the last 8760 hours. CBG: No results for input(s): GLUCAP in the last 168 hours.  Urinalysis    Component Value Date/Time   COLORURINE YELLOW 09/26/2017 1039  APPEARANCEUR HAZY (A) 09/26/2017 1039   LABSPEC 1.009 09/26/2017 1039   PHURINE 7.0 09/26/2017 1039   GLUCOSEU NEGATIVE 09/26/2017 1039   GLUCOSEU NEGATIVE 11/24/2010 1224   HGBUR NEGATIVE 09/26/2017 1039   BILIRUBINUR NEGATIVE 09/26/2017 1039   KETONESUR NEGATIVE 09/26/2017 1039   PROTEINUR NEGATIVE 09/26/2017 1039   UROBILINOGEN 0.2 05/05/2014 2123   NITRITE POSITIVE (A) 09/26/2017 1039   LEUKOCYTESUR NEGATIVE 09/26/2017 1039       Radiographic Studies: Ct Head Wo Contrast  Result Date: 10/20/2017 CLINICAL DATA:  Syncope. EXAM: CT HEAD WITHOUT CONTRAST CT CERVICAL SPINE WITHOUT CONTRAST TECHNIQUE: Multidetector CT imaging of the head and cervical spine was performed following the standard protocol without intravenous contrast. Multiplanar CT image reconstructions of the cervical spine were also generated. COMPARISON:  CT scan of September 26, 2017. FINDINGS: CT HEAD FINDINGS Brain: Mild diffuse cortical atrophy is noted. Moderate chronic ischemic white matter disease is noted. No mass effect or midline shift is noted. Ventricular size is within normal limits. There is no evidence of mass lesion, hemorrhage or acute infarction. Vascular: No hyperdense vessel or unexpected calcification. Skull: Normal. Negative for fracture or focal lesion. Sinuses/Orbits: No acute finding. Other: None. CT CERVICAL SPINE FINDINGS Alignment: Normal. Skull base and vertebrae: No acute fracture. No primary bone lesion or focal pathologic process. Soft tissues and spinal canal: No prevertebral fluid or swelling. No visible canal hematoma. Disc levels: Anterior osteophyte formation is noted at C5-6 and C6-7. Upper chest: Negative. Other: None. IMPRESSION: Mild diffuse cortical atrophy. Moderate chronic ischemic white matter disease. No acute intracranial abnormality seen. Multilevel degenerative changes are noted. No acute abnormality seen in the cervical spine. Electronically Signed   By: Marijo Conception, M.D.   On: 10/20/2017 15:49   Ct Angio Chest Pe W And/or Wo Contrast  Result Date: 10/20/2017 CLINICAL DATA:  Syncopal episode today after getting out of the shower. Dizziness and upper back pain. EXAM: CT ANGIOGRAPHY CHEST WITH CONTRAST TECHNIQUE: Multidetector CT imaging of the chest was performed using the standard protocol during bolus administration of intravenous contrast. Multiplanar CT image reconstructions and MIPs were obtained to evaluate the  vascular anatomy. CONTRAST:  68mL ISOVUE-370 IOPAMIDOL (ISOVUE-370) INJECTION 76% COMPARISON:  Chest radiographs 09/26/2017. Thoracic spine CT 08/11/2016. Chest CTA 07/17/2012. FINDINGS: Cardiovascular: Pulmonary arterial opacification is adequate without evidence of emboli. The ascending aorta measures up to 3.8 cm in diameter, unchanged from 2013. Coronary artery atherosclerosis and mild cardiomegaly are noted. There is no pericardial effusion. Mediastinum/Nodes: No enlarged axillary, mediastinal, or hilar lymph nodes. Unremarkable esophagus. 8 mm calcification in the right thyroid lobe. Similar appearance of asymmetric right thyroid lobe enlargement extending posteriorly behind the esophagus. Lungs/Pleura: No pleural effusion or pneumothorax. Mild respiratory motion artifact with mild dependent atelectasis in both lungs. No mass. Upper Abdomen: Status post cholecystectomy. Musculoskeletal: No acute osseous abnormality. Healed posterior left fourth rib fractures, unchanged from 07/2016 though new from 2013. Review of the MIP images confirms the above findings. IMPRESSION: 1. No evidence of pulmonary emboli or other acute abnormality in the chest. 2. Aortic Atherosclerosis (ICD10-I70.0). Electronically Signed   By: Logan Bores M.D.   On: 10/20/2017 15:50   Ct Cervical Spine Wo Contrast  Result Date: 10/20/2017 CLINICAL DATA:  Syncope. EXAM: CT HEAD WITHOUT CONTRAST CT CERVICAL SPINE WITHOUT CONTRAST TECHNIQUE: Multidetector CT imaging of the head and cervical spine was performed following the standard protocol without intravenous contrast. Multiplanar CT image reconstructions of the cervical spine were also generated. COMPARISON:  CT  scan of September 26, 2017. FINDINGS: CT HEAD FINDINGS Brain: Mild diffuse cortical atrophy is noted. Moderate chronic ischemic white matter disease is noted. No mass effect or midline shift is noted. Ventricular size is within normal limits. There is no evidence of mass lesion,  hemorrhage or acute infarction. Vascular: No hyperdense vessel or unexpected calcification. Skull: Normal. Negative for fracture or focal lesion. Sinuses/Orbits: No acute finding. Other: None. CT CERVICAL SPINE FINDINGS Alignment: Normal. Skull base and vertebrae: No acute fracture. No primary bone lesion or focal pathologic process. Soft tissues and spinal canal: No prevertebral fluid or swelling. No visible canal hematoma. Disc levels: Anterior osteophyte formation is noted at C5-6 and C6-7. Upper chest: Negative. Other: None. IMPRESSION: Mild diffuse cortical atrophy. Moderate chronic ischemic white matter disease. No acute intracranial abnormality seen. Multilevel degenerative changes are noted. No acute abnormality seen in the cervical spine. Electronically Signed   By: Marijo Conception, M.D.   On: 10/20/2017 15:49    EKG: Independently reviewed. Atrial fibrillation with a heart rate in the 60s. She has right bundle branch block. She has an insignificant Q in 3. She has a Q in V1. No acute ST-T wave changes   Assessment/Plan:   Principal Problem:   Syncope Active Problems:   HLD (hyperlipidemia)   COPD, moderate (HCC)   Coronary artery disease   Atrial fibrillation, permanent   Diabetes mellitus type II, non insulin dependent (HCC)   HTN (hypertension)   Depression  SYNCOPE Patient has been previously admitted for multiple episodes of syncope in the past. She's completed a workup which has revealed a combination of postural hypotension, bradycardia with possible chronotropic incompetence, possible sinus pauses and other vasovagal mechanisms. Patient has a UTI today which are probably exacerbating these mechanisms.  UTI We'll provide gentle hydration and a dose of ceftriaxone. Patient can be discharged home on oral antibiotics.  BACK INJURY Patient with market ecchymoses on the right side of her back presumably from her fall this morning. Imaging without evidence of fracture. No  pneumothorax.Patient states that she's tolerated tramadol at home in the past. Will provide prn tramadol.  DM Continue glipizide  HTN Continue amlodipine  ATRIAL FIBRILLATION Patient is not on any rate control medications secondary to previous history of bradycardia and sinus pauses. While I was in the room patient's heart rate varied from 62-78without provocation. I will continue patient's Eliquis as per home doses, however this decision to continue this patient with history of multiple episodes of syncope on Eliquis can be revisited at her PCPs.   Other information:   DVT prophylaxis: Lovenox ordered. Code Status: Full code. Family Communication: patient's granddaughter Anderson Malta was visiting her grand mother today in the hospital. Disposition Plan: home Consults called: none Admission status: observation   The medical decision making is of moderate complexity, therefore this is a level 2 visit.  Dewaine Oats Derek Jack [facility] Pager [phone] Cell: [phone]   If 7PM-7AM, please contact night-coverage [email or web address] Password [email or web address] 10/20/2017, 5:10 PM

## 2017-10-20 NOTE — Progress Notes (Signed)
Pharmacy Antibiotic Note  [name] is a 84 y.o. female admitted on 10/20/2017 with UTI.  Pharmacy has been consulted for ceftriaxone dosing. Noted "unknown" reaction to amoxicillin listed - per discussion with Dr. Jamse Arn, ok to start ceftriaxone  Plan: Ceftriaxone 1g IV q24h Monitor clinical progress, LOT Not renally adjusted - Rx will s/o consult     Temp (24hrs), Avg:97.8 F (36.6 C), Min:97.8 F (36.6 C), Max:97.8 F (36.6 C)  Recent Labs  Lab 10/20/17 1327  WBC 5.3  CREATININE 1.24*    CrCl cannot be calculated (Unknown ideal weight.).    Allergies  Allergen Reactions  . Amoxicillin Other (See Comments)    UNKNOWN    Elicia Lamp, PharmD, BCPS Clinical Pharmacist 10/20/2017 5:38 PM

## 2017-10-20 NOTE — ED Notes (Signed)
Inserted PW. Patient was comfortable with it. Got her a warm blanket.

## 2017-10-20 NOTE — ED Notes (Signed)
Admitting at bedside 

## 2017-10-20 NOTE — ED Provider Notes (Signed)
[facility] Provider Note   CSN: [id number] Arrival date & time: 10/20/17  1258     History   Chief Complaint Chief Complaint  Patient presents with  . Loss of Consciousness    HPI [name] is a 84 y.o. female.  HPI   84 year old female with a history of diabetes, hypertension, hyperlipidemia, right bundle branch block, prior episodes of syncope with prior admission showing sinus pause at the time thought to be vasovagal and related to metoprolol, presents with concern for syncopal episode while getting out of the shower today.  Patient reports that she is in normal state of health today, when she got out of the shower and began to feel very lightheaded, next thing she knew she woke up on the floor.  Reports she is not sure if she hit her head.  She reports severe pain in the right side of her back, that is worse with movements and deep breaths.  Denies nausea, vomiting, fevers, cough, shortness of breath or chest pain.  No numbness or weakness on one side or the other.  Denies neck pain.  Denies black or bloody stools.  Reports that this episode felt similar to the prior episodes she is had.  She initially denied urinary symptoms, but then reported that she has had some urinary symptoms to her granddaughter who is at bedside.  Past Medical History:  Diagnosis Date  . 1st degree AV block   . Acute renal failure ([place])    due to dehydration-Sept 2009; Normal creat 1.2 on fu 05/13/2009  . Allergic rhinitis   . Anemia    NOS chronic disease. Hgb 11.6gm% 04/14/2009  . Bradycardia   . CAD (coronary artery disease)    a. s/p DES mLAD 2005. b. Cutting balloon angioplasty for ISR 2006. c. LHC 08/2010 nonobstructive. d. Myoview 06/2012: small anteroapical infarct/mod inferolat wall infarct but no ischemia, EF 75%.  . Cancer ([place])    on nose and had it removed  . Chronic anticoagulation   . CKD (chronic kidney disease), stage III ([place])   .  Dementia    slight  . Diabetes mellitus type II   . Diverticulitis of colon   . DM neuropathy, type II diabetes mellitus ([place])   . GERD (gastroesophageal reflux disease)   . HTN (hypertension)   . Hx of colonoscopy   . Hyperlipidemia   . IBS (irritable bowel syndrome)   . Multinodular goiter   . Obesity    BMI-37  . Orthostatic hypotension   . Osteoarthritis    lower back  . Osteoporosis   . Pneumonia   . PVC's (premature ventricular contractions)    a. seen on telemetry 02/2016.  Marland Kitchen RBBB   . Rheumatoid arthritis ([place])   . Spinal stenosis    djd lumbar    Patient Active Problem List   Diagnosis Date Noted  . Syncope 02/17/2017  . PVC's (premature ventricular contractions) 03/07/2016  . Depression 03/06/2016  . Chest pain at rest 03/06/2016  . Unstable angina ([name]) 10/24/2015  . Chronic anticoagulation for A fib, CHA2DS2VASc = 6 12/18/2014  . Chest pain, negative MI, negative Nuc, may be GI 12/17/2014  . Diabetes mellitus type II, non insulin dependent ([place])   . HTN (hypertension)   . Tachycardia 08/07/2011  . Atrial fibrillation, permanent 06/25/2011  . Near Syncope 06/06/2011  . Coronary artery disease 04/05/2011  . Bradycardia 12/09/2009  . ACUTE BRONCHITIS 06/17/2009  . COPD, moderate ([place]) 06/17/2009  .  PULMONARY NODULE, RIGHT UPPER LOBE 06/07/2009  . SHORTNESS OF BREATH (SOB) 06/07/2009  . POSTURAL HYPOTENSION 05/23/2009  . DEHYDRATION 05/13/2009  . ABDOMINAL PAIN, LOWER 01/21/2009  . HOARSENESS, CHRONIC 12/03/2007  . GOITER 09/30/2007  . DIABETIC PERIPHERAL NEUROPATHY 09/30/2007  . OBESITY 09/30/2007  . HIATAL HERNIA 09/30/2007  . OSTEOARTHRITIS 09/30/2007  . SPINAL STENOSIS 09/30/2007  . PERIPHERAL EDEMA 09/30/2007  . HLD (hyperlipidemia) 02/12/2007  . ANEMIA-NOS 02/12/2007  . Neuropathy ([place]) 02/12/2007  . Allergic rhinitis, cause unspecified 02/12/2007  . GERD 02/12/2007  . DIVERTICULOSIS, COLON 02/12/2007    Past Surgical History:  Procedure  Laterality Date  . ABDOMINAL HYSTERECTOMY    . bilateral arthroscopic knee surgery    . carpel tunnel wrist rt    . CHOLECYSTECTOMY    . CORONARY ANGIOPLASTY     stent  . NASAL SINUS SURGERY     x2  . stent surgery       OB History   None      Home Medications    Prior to Admission medications   Medication Sig Start Date End Date Taking? Authorizing Provider  alendronate (FOSAMAX) 70 MG tablet TAKE 1 TABLET BY MOUTH EVERY 7 DAYS ON TUESDAYS WITH A FULL GLASS OF WATER & ON AN EMPTY STOMACH Patient not taking: Reported on 09/26/2017 02/22/16   Fay Records, MD  amLODipine (NORVASC) 5 MG tablet Take 5 mg by mouth at bedtime.  03/20/16   [provider]  apixaban (ELIQUIS) 5 MG TABS tablet Take 1 tablet (5 mg total) by mouth 2 (two) times daily. 01/31/15   Richardson Dopp T, PA-C  atorvastatin (LIPITOR) 20 MG tablet Take 20 mg by mouth daily.    [provider]  Calcium Carbonate-Vitamin D (CALCIUM 500/D PO) Take 1 tablet by mouth daily.     [provider]  fluticasone (FLONASE) 50 MCG/ACT nasal spray Place 1 spray into both nostrils daily as needed for allergies.  01/17/16   [provider]  gabapentin (NEURONTIN) 600 MG tablet Take 0.5 tablets (300 mg total) by mouth 2 (two) times daily. 02/19/17   Geradine Girt, DO  glipiZIDE (GLUCOTROL XL) 5 MG 24 hr tablet Take 5 mg by mouth every evening.     [provider]  LUMIGAN 0.01 % SOLN Place 1 drop into both eyes at bedtime. 02/11/17   [provider]  Multiple Vitamins-Minerals (MULTIVITAMIN PO) Take 1 tablet by mouth daily.    [provider]  Multiple Vitamins-Minerals (PRESERVISION AREDS PO) Take 1 capsule by mouth 2 (two) times daily.    [provider]  nitroGLYCERIN (NITROSTAT) 0.4 MG SL tablet Place 0.4 mLs under the tongue every 5 (five) minutes as needed for chest pain (Max 3 doses).  08/16/14   [provider]  ondansetron (ZOFRAN) 8 MG tablet Take 1  tablet (8 mg total) by mouth every 8 (eight) hours as needed for nausea or vomiting. 12/30/15   Daleen Bo, MD  oxybutynin (DITROPAN) 5 MG tablet Take 5 mg by mouth daily.    [provider]  traMADol (ULTRAM) 50 MG tablet Take 1 tablet (50 mg total) by mouth every 12 (twelve) hours as needed for moderate pain. Patient not taking: Reported on 09/26/2017 02/19/17   Geradine Girt, DO    Family History Family History  Problem Relation Age of Onset  . Heart attack Father   . Breast cancer Sister   . Heart disease Brother   . Heart attack Son   .  Heart attack Daughter   . Colon cancer Neg Hx   . Stroke Neg Hx     Social History Social History   Tobacco Use  . Smoking status: Never Smoker  . Smokeless tobacco: Never Used  Substance Use Topics  . Alcohol use: No  . Drug use: No     Allergies   Amoxicillin   Review of Systems Review of Systems  Constitutional: Positive for fatigue. Negative for fever.  HENT: Negative for sore throat.   Eyes: Negative for visual disturbance.  Respiratory: Negative for cough and shortness of breath.   Cardiovascular: Negative for chest pain.  Gastrointestinal: Negative for abdominal pain, anal bleeding, nausea and vomiting.  Genitourinary: Negative for difficulty urinating and dysuria.  Musculoskeletal: Positive for back pain. Negative for neck pain.  Skin: Negative for rash.  Neurological: Positive for syncope and light-headedness. Negative for weakness, numbness and headaches.     Physical Exam Updated Vital Signs BP (!) 145/57   Pulse 72   Temp 97.8 F (36.6 C) (Oral)   Resp 18   SpO2 98%   Physical Exam  Constitutional: She is oriented to person, place, and time. She appears well-developed and well-nourished. No distress.  HENT:  Head: Normocephalic and atraumatic.  Eyes: Conjunctivae and EOM are normal.  Neck: Normal range of motion.  Cardiovascular: Normal rate, regular rhythm, normal heart sounds and intact  distal pulses. Exam reveals no gallop and no friction rub.  No murmur heard. Pulmonary/Chest: Effort normal and breath sounds normal. No respiratory distress. She has no wheezes. She has no rales.  Abdominal: Soft. She exhibits no distension. There is no tenderness. There is no guarding.  Musculoskeletal: She exhibits no edema or tenderness.  Tenderness right side of thoracic back/right ribs  Neurological: She is alert and oriented to person, place, and time.  Skin: Skin is warm and dry. No rash noted. She is not diaphoretic. No erythema.  Nursing note and vitals reviewed.    ED Treatments / Results  Labs (all labs ordered are listed, but only abnormal results are displayed) Labs Reviewed  COMPREHENSIVE METABOLIC PANEL - Abnormal; Notable for the following components:      Result Value   Chloride 98 (*)    Glucose, Bld 257 (*)    Creatinine, Ser 1.24 (*)    Calcium 8.7 (*)    Total Protein 6.1 (*)    Albumin 3.3 (*)    GFR calc non Af Amer 37 (*)    GFR calc Af Amer 43 (*)    All other components within normal limits  URINE CULTURE  CBC WITH DIFFERENTIAL/PLATELET  URINALYSIS, ROUTINE W REFLEX MICROSCOPIC  I-STAT TROPONIN, ED    EKG EKG Interpretation  Date/Time:  Sunday October 20 2017 13:05:39 EDT Ventricular Rate:  77 PR Interval:    QRS Duration: 158 QT Interval:  428 QTC Calculation: 485 R Axis:   35 Text Interpretation:  Atrial fibrillation Right bundle branch block Since prior ECG, rhythm appears to be atrial fibrillation Confirmed by Gareth Morgan [phone]) on 10/20/2017 1:20:58 PM   Radiology Ct Head Wo Contrast  Result Date: 10/20/2017 CLINICAL DATA:  Syncope. EXAM: CT HEAD WITHOUT CONTRAST CT CERVICAL SPINE WITHOUT CONTRAST TECHNIQUE: Multidetector CT imaging of the head and cervical spine was performed following the standard protocol without intravenous contrast. Multiplanar CT image reconstructions of the cervical spine were also generated. COMPARISON:  CT  scan of September 26, 2017. FINDINGS: CT HEAD FINDINGS Brain: Mild diffuse cortical atrophy is noted.  Moderate chronic ischemic white matter disease is noted. No mass effect or midline shift is noted. Ventricular size is within normal limits. There is no evidence of mass lesion, hemorrhage or acute infarction. Vascular: No hyperdense vessel or unexpected calcification. Skull: Normal. Negative for fracture or focal lesion. Sinuses/Orbits: No acute finding. Other: None. CT CERVICAL SPINE FINDINGS Alignment: Normal. Skull base and vertebrae: No acute fracture. No primary bone lesion or focal pathologic process. Soft tissues and spinal canal: No prevertebral fluid or swelling. No visible canal hematoma. Disc levels: Anterior osteophyte formation is noted at C5-6 and C6-7. Upper chest: Negative. Other: None. IMPRESSION: Mild diffuse cortical atrophy. Moderate chronic ischemic white matter disease. No acute intracranial abnormality seen. Multilevel degenerative changes are noted. No acute abnormality seen in the cervical spine. Electronically Signed   By: Marijo Conception, M.D.   On: 10/20/2017 15:49   Ct Angio Chest Pe W And/or Wo Contrast  Result Date: 10/20/2017 CLINICAL DATA:  Syncopal episode today after getting out of the shower. Dizziness and upper back pain. EXAM: CT ANGIOGRAPHY CHEST WITH CONTRAST TECHNIQUE: Multidetector CT imaging of the chest was performed using the standard protocol during bolus administration of intravenous contrast. Multiplanar CT image reconstructions and MIPs were obtained to evaluate the vascular anatomy. CONTRAST:  67mL ISOVUE-370 IOPAMIDOL (ISOVUE-370) INJECTION 76% COMPARISON:  Chest radiographs 09/26/2017. Thoracic spine CT 08/11/2016. Chest CTA 07/17/2012. FINDINGS: Cardiovascular: Pulmonary arterial opacification is adequate without evidence of emboli. The ascending aorta measures up to 3.8 cm in diameter, unchanged from 2013. Coronary artery atherosclerosis and mild cardiomegaly are  noted. There is no pericardial effusion. Mediastinum/Nodes: No enlarged axillary, mediastinal, or hilar lymph nodes. Unremarkable esophagus. 8 mm calcification in the right thyroid lobe. Similar appearance of asymmetric right thyroid lobe enlargement extending posteriorly behind the esophagus. Lungs/Pleura: No pleural effusion or pneumothorax. Mild respiratory motion artifact with mild dependent atelectasis in both lungs. No mass. Upper Abdomen: Status post cholecystectomy. Musculoskeletal: No acute osseous abnormality. Healed posterior left fourth rib fractures, unchanged from 07/2016 though new from 2013. Review of the MIP images confirms the above findings. IMPRESSION: 1. No evidence of pulmonary emboli or other acute abnormality in the chest. 2. Aortic Atherosclerosis (ICD10-I70.0). Electronically Signed   By: Logan Bores M.D.   On: 10/20/2017 15:50   Ct Cervical Spine Wo Contrast  Result Date: 10/20/2017 CLINICAL DATA:  Syncope. EXAM: CT HEAD WITHOUT CONTRAST CT CERVICAL SPINE WITHOUT CONTRAST TECHNIQUE: Multidetector CT imaging of the head and cervical spine was performed following the standard protocol without intravenous contrast. Multiplanar CT image reconstructions of the cervical spine were also generated. COMPARISON:  CT scan of September 26, 2017. FINDINGS: CT HEAD FINDINGS Brain: Mild diffuse cortical atrophy is noted. Moderate chronic ischemic white matter disease is noted. No mass effect or midline shift is noted. Ventricular size is within normal limits. There is no evidence of mass lesion, hemorrhage or acute infarction. Vascular: No hyperdense vessel or unexpected calcification. Skull: Normal. Negative for fracture or focal lesion. Sinuses/Orbits: No acute finding. Other: None. CT CERVICAL SPINE FINDINGS Alignment: Normal. Skull base and vertebrae: No acute fracture. No primary bone lesion or focal pathologic process. Soft tissues and spinal canal: No prevertebral fluid or swelling. No visible  canal hematoma. Disc levels: Anterior osteophyte formation is noted at C5-6 and C6-7. Upper chest: Negative. Other: None. IMPRESSION: Mild diffuse cortical atrophy. Moderate chronic ischemic white matter disease. No acute intracranial abnormality seen. Multilevel degenerative changes are noted. No acute abnormality seen in the cervical spine.  Electronically Signed   By: Marijo Conception, M.D.   On: 10/20/2017 15:49    Procedures Procedures (including critical care time)  Medications Ordered in ED Medications  sodium chloride 0.9 % bolus 1,000 mL (1,000 mLs Intravenous New Bag/Given 10/20/17 1637)  fentaNYL (SUBLIMAZE) injection 25 mcg (25 mcg Intravenous Given 10/20/17 1410)  iopamidol (ISOVUE-370) 76 % injection (60 mLs  Contrast Given 10/20/17 1519)  acetaminophen (TYLENOL) tablet 1,000 mg (1,000 mg Oral Given 10/20/17 1637)  cyclobenzaprine (FLEXERIL) tablet 10 mg (10 mg Oral Given 10/20/17 1637)     Initial Impression / Assessment and Plan / ED Course  I have reviewed the triage vital signs and the nursing notes.  Pertinent labs & imaging results that were available during my care of the patient were reviewed by me and considered in my medical decision making (see chart for details).    84 year old female with a history of diabetes, hypertension, hyperlipidemia, right bundle branch block, prior episodes of syncope with prior admission showing sinus pause at the time thought to be vasovagal and related to metoprolol, presents with concern for syncopal episode while getting out of the shower today.  EKG shows right bundle branch block without acute change from prior.  Labs show no significant anemia or electrolyte abnormalities.  Given patient's history of fall, severe pain, CT Angio was done to evaluate for pulmonary embolus or signs of rib fractures, and showed no acute abnormalities.  Do not feel location of pain is suspicious for retroperitoneal hematoma.  Suspect patient's severe right-sided  back pain is likely secondary to rib contusion and muscular strain.  CT had a cervical spine without significant abnormalities.  Urinalysis is pending at this time, with granddaughter reporting that patient is more prone to syncopal episodes when she has urinary tract infections, patient reporting urinary symptoms today.  Discussed options of close outpatient cardiology follow-up given patient's prior history of vasovagal episodes, however given patient's pain limiting mobility, as well as her concerns regarding repeat and more frequent syncopal episodes, age, history of prior pauses, it is appropriate to admit her for continued syncope obs and pain control for rib contusion.    Final Clinical Impressions(s) / ED Diagnoses   Final diagnoses:  Syncope, unspecified syncope type  Rib contusion, right, initial encounter    ED Discharge Orders    None       Gareth Morgan, MD 10/20/17 1650

## 2017-10-20 NOTE — ED Triage Notes (Signed)
Pt arrives via EMS from home with a syncopal episode after getting out of the shower. Pt reports dizziness. Pt A&Ox4

## 2017-10-21 ENCOUNTER — Other Ambulatory Visit: Payer: Self-pay

## 2017-10-21 DIAGNOSIS — I482 Chronic atrial fibrillation: Secondary | ICD-10-CM

## 2017-10-21 DIAGNOSIS — E119 Type 2 diabetes mellitus without complications: Secondary | ICD-10-CM | POA: Diagnosis not present

## 2017-10-21 DIAGNOSIS — R55 Syncope and collapse: Secondary | ICD-10-CM | POA: Diagnosis not present

## 2017-10-21 LAB — BASIC METABOLIC PANEL
ANION GAP: 9 (ref 5–15)
BUN: 11 mg/dL (ref 6–20)
CALCIUM: 8.8 mg/dL — AB (ref 8.9–10.3)
CHLORIDE: 104 mmol/L (ref 101–111)
CO2: 26 mmol/L (ref 22–32)
Creatinine, Ser: 0.97 mg/dL (ref 0.44–1.00)
GFR calc non Af Amer: 50 mL/min — ABNORMAL LOW (ref >60)
GFR, EST AFRICAN AMERICAN: 57 mL/min — AB (ref >60)
Glucose, Bld: 133 mg/dL — ABNORMAL HIGH (ref 65–99)
Potassium: 4.8 mmol/L (ref 3.5–5.1)
Sodium: 139 mmol/L (ref 135–145)

## 2017-10-21 LAB — CBC
HCT: 38.2 % (ref 36.0–46.0)
HEMOGLOBIN: 12.5 g/dL (ref 12.0–15.0)
MCH: 30.9 pg (ref 26.0–34.0)
MCHC: 32.7 g/dL (ref 30.0–36.0)
MCV: 94.6 fL (ref 78.0–100.0)
Platelets: 173 10*3/uL (ref 150–400)
RBC: 4.04 MIL/uL (ref 3.87–5.11)
RDW: 13.9 % (ref 11.5–15.5)
WBC: 5.1 10*3/uL (ref 4.0–10.5)

## 2017-10-21 LAB — GLUCOSE, CAPILLARY: GLUCOSE-CAPILLARY: 114 mg/dL — AB (ref 65–99)

## 2017-10-21 MED ORDER — LINAGLIPTIN 5 MG PO TABS
5.0000 mg | ORAL_TABLET | Freq: Every day | ORAL | Status: DC
  Administered 2017-10-21 – 2017-10-22 (×2): 5 mg via ORAL
  Filled 2017-10-21 (×2): qty 1

## 2017-10-21 NOTE — Discharge Instructions (Signed)

## 2017-10-21 NOTE — Progress Notes (Signed)
Inpatient Diabetes Program Recommendations  AACE/ADA: New Consensus Statement on Inpatient Glycemic Control (2015)  Target Ranges:  Prepandial:   less than 140 mg/dL      Peak postprandial:   less than 180 mg/dL (1-2 hours)      Critically ill patients:  140 - 180 mg/dL   Lab Results  Component Value Date   GLUCAP 114 (H) 10/21/2017   HGBA1C 6.3 03/19/2016    Review of Glycemic Control  Diabetes history: DM2 Outpatient Diabetes medications: Glucotrol 5 mg Current orders for Inpatient glycemic control: Glucotrol 5 mg  Inpatient Diabetes Program Recommendations:   Due to patient's age, consider change of Glucotrol to possible Tradjenta on discharge. Or referral to PCP for reevaluation of medications.  Will follow during hospitalization.  Thank you, Nani Gasser. Dickie Labarre, RN, MSN, CDE  Diabetes Coordinator Inpatient Glycemic Control Team Team Pager [phone] (8am-5pm) 10/21/2017 12:54 PM

## 2017-10-21 NOTE — Evaluation (Signed)
Physical Therapy Evaluation Patient Details Name: [name] MRN: [id number] DOB: [birth year] Today's Date: 10/21/2017   History of Present Illness  Pt adm with syncopal episode likely due to dehydration due to UTI. PMH - syncope, HTN, CAD, DM, neuropathy, RA, orthostatic hypotension  Clinical Impression  Pt presents to PT with decreased mobility due to weakness, decreased activity tolerance, and decreased balance. Pt lives alone and is currently unable to function at an independent level. Recommend ST-SNF for further rehab and pt and granddaughter are in agreement.     Follow Up Recommendations SNF    Equipment Recommendations  None recommended by PT    Recommendations for Other Services       Precautions / Restrictions Precautions Precautions: Fall Restrictions Weight Bearing Restrictions: No      Mobility  Bed Mobility Overal bed mobility: Needs Assistance Bed Mobility: Supine to Sit     Supine to sit: Min assist;HOB elevated     General bed mobility comments: Assist to elevate trunk into sitting and to bring hips to EOB  Transfers Overall transfer level: Needs assistance Equipment used: 4-wheeled walker Transfers: Sit to/from Stand Sit to Stand: Min assist         General transfer comment: Assist to bring hips up and for balance  Ambulation/Gait Ambulation/Gait assistance: Min assist Ambulation Distance (Feet): 30 Feet Assistive device: 4-wheeled walker Gait Pattern/deviations: Step-through pattern;Decreased step length - right;Decreased step length - left;Antalgic;Trunk flexed Gait velocity: decr Gait velocity interpretation: <1.8 ft/sec, indicative of risk for recurrent falls General Gait Details: Assist for balance and support. Limp on lt due to chronic knee pain. Verbal cues to stand more erect  Stairs            Wheelchair Mobility    Modified Rankin (Stroke Patients Only)       Balance Overall balance assessment: Needs  assistance Sitting-balance support: No upper extremity supported;Feet supported Sitting balance-Leahy Scale: Good     Standing balance support: Bilateral upper extremity supported Standing balance-Leahy Scale: Poor Standing balance comment: rollator and min assist for static standing                             Pertinent Vitals/Pain Pain Assessment: Faces Faces Pain Scale: Hurts little more Pain Location: lt knee - chronic Pain Descriptors / Indicators: Grimacing Pain Intervention(s): Limited activity within patient's tolerance;Monitored during session    Home Living Family/patient expects to be discharged to:: Private residence Living Arrangements: Alone Available Help at Discharge: Family;Available PRN/intermittently Type of Home: House Home Access: Stairs to enter Entrance Stairs-Rails: Right Entrance Stairs-Number of Steps: 2 Home Layout: Two level;Able to live on main level with bedroom/bathroom Home Equipment: Walker - 4 wheels;Bedside commode;Shower seat;Grab bars - tub/shower;Hand held shower head      Prior Function Level of Independence: Independent with assistive device(s)         Comments: Uses rollator     Hand Dominance   Dominant Hand: Right    Extremity/Trunk Assessment   Upper Extremity Assessment Upper Extremity Assessment: Generalized weakness    Lower Extremity Assessment Lower Extremity Assessment: Generalized weakness       Communication   Communication: No difficulties  Cognition Arousal/Alertness: Awake/alert Behavior During Therapy: WFL for tasks assessed/performed Overall Cognitive Status: Within Functional Limits for tasks assessed  General Comments      Exercises     Assessment/Plan    PT Assessment Patient needs continued PT services  PT Problem List Decreased strength;Decreased activity tolerance;Decreased balance;Decreased mobility;Cardiopulmonary  status limiting activity       PT Treatment Interventions DME instruction;Gait training;Functional mobility training;Therapeutic activities;Therapeutic exercise;Balance training;Patient/family education    PT Goals (Current goals can be found in the Care Plan section)  Acute Rehab PT Goals Patient Stated Goal: Get better PT Goal Formulation: With patient/family Time For Goal Achievement: 11/04/17 Potential to Achieve Goals: Good    Frequency Min 3X/week   Barriers to discharge Decreased caregiver support Lives alone    Co-evaluation               AM-PAC PT "6 Clicks" Daily Activity  Outcome Measure Difficulty turning over in bed (including adjusting bedclothes, sheets and blankets)?: Unable Difficulty moving from lying on back to sitting on the side of the bed? : Unable Difficulty sitting down on and standing up from a chair with arms (e.g., wheelchair, bedside commode, etc,.)?: Unable Help needed moving to and from a bed to chair (including a wheelchair)?: A Little Help needed walking in hospital room?: A Little Help needed climbing 3-5 steps with a railing? : A Lot 6 Click Score: 11    End of Session Equipment Utilized During Treatment: Gait belt Activity Tolerance: Patient limited by fatigue Patient left: in chair;with call bell/phone within reach;with chair alarm set Nurse Communication: Mobility status PT Visit Diagnosis: Unsteadiness on feet (R26.81);Other abnormalities of gait and mobility (R26.89);Muscle weakness (generalized) (M62.81);Repeated falls (R29.6)    Time: 4680-3212 PT Time Calculation (min) (ACUTE ONLY): 26 min   Charges:   PT Evaluation $PT Eval Moderate Complexity: 1 Mod PT Treatments $Gait Training: 8-22 mins   PT G Codes:        [facility] PT [place] 10/21/2017, 2:19 PM

## 2017-10-21 NOTE — Progress Notes (Signed)
PROGRESS NOTE    [name]  EPP:[id number] DOB: October 12, 1923 DOA: 10/20/2017 PCP: Chesley Noon, MD   Outpatient Specialists:    Brief Narrative:  [name] is an 84 y.o. femalewith a past medical history of hypertension, atrial fibrillation, coronary artery disease, type 2 diabetes who is being admitted for syncope.  Patient has a previous history of syncope with multiple admissions in the past which has been attributed to postural hypotension and or bradycardia and or sinus pause. Patient was seen by electrophysiologist last year and syncope was thought to be vagally mediated. Patient was not considered a candidate for pacemaker at that time.   Today patient states that she was taking a hot shower and when she came out of the shower she had syncope.      Assessment & Plan:   Principal Problem:   Syncope Active Problems:   HLD (hyperlipidemia)   COPD, moderate (HCC)   Coronary artery disease   Atrial fibrillation, permanent   Diabetes mellitus type II, non insulin dependent (HCC)   HTN (hypertension)   Depression   SYNCOPE Patient has been previously admitted for multiple episodes of syncope in the past. She's completed a workup which has revealed a combination of postural hypotension, bradycardia with possible chronotropic incompetence, possible sinus pauses and other vasovagal mechanisms. -has worn holter in past with out issues found--appears for 48 hours -needs to follow up with cardiology (ross) -appeared to be dehydrated from UTI leading to syncope  UTI- recent culture showed e coli (09/2017) IV rocephin Change to PO upon d/c  BACK INJURY  Imaging without evidence of fracture -PRN pain meds  DM Continue glipizide  HTN Continue amlodipine  ATRIAL FIBRILLATION -not on any rate control medications secondary to previous history of bradycardia and sinus pauses -continue patient's Eliquis as per home doses, however this decision to  continue this patient with history of multiple episodes of syncope on Eliquis can be revisited at her PCPs.     DVT prophylaxis:  Fully anticoagulated   Code Status: Full Code   Family Communication:   Disposition Plan:  Await PT eval-- lives home alone   Consultants:        Subjective: Asking when she gets to go home  Objective: Vitals:   10/20/17 2204 10/21/17 0032 10/21/17 0427 10/21/17 0938  BP: (!) 132/50 129/88 (!) 149/58 (!) 143/57  Pulse:  82 75 76  Resp:  18 18 18   Temp:  97.6 F (36.4 C) (!) 97.5 F (36.4 C)   TempSrc:  Oral Oral   SpO2:  96% 98% 95%  Weight:   79.7 kg (175 lb 11.2 oz)   Height:        Intake/Output Summary (Last 24 hours) at 10/21/2017 1227 Last data filed at 10/21/2017 0948 Gross per 24 hour  Intake 2080 ml  Output 1200 ml  Net 880 ml   Filed Weights   10/20/17 1807 10/21/17 0427  Weight: 79.8 kg (175 lb 14.4 oz) 79.7 kg (175 lb 11.2 oz)    Examination:  General exam: Appears calm and comfortable  Respiratory system: Clear to auscultation. Respiratory effort normal. Cardiovascular system: irr   Data Reviewed: I have personally reviewed following labs and imaging studies  CBC: Recent Labs  Lab 10/20/17 1327 10/21/17 0247  WBC 5.3 5.1  NEUTROABS 3.8  --   HGB 13.0 12.5  HCT 40.3 38.2  MCV 94.6 94.6  PLT 194 606   Basic Metabolic Panel: Recent Labs  Lab  10/20/17 1327 10/21/17 0247  NA 135 139  K 4.1 4.8  CL 98* 104  CO2 26 26  GLUCOSE 257* 133*  BUN 13 11  CREATININE 1.24* 0.97  CALCIUM 8.7* 8.8*   GFR: Estimated Creatinine Clearance: 37.7 mL/min (by C-G formula based on SCr of 0.97 mg/dL). Liver Function Tests: Recent Labs  Lab 10/20/17 1327  AST 25  ALT 20  ALKPHOS 61  BILITOT 0.7  PROT 6.1*  ALBUMIN 3.3*   No results for input(s): LIPASE, AMYLASE in the last 168 hours. No results for input(s): AMMONIA in the last 168 hours. Coagulation Profile: No results for input(s): INR, PROTIME in  the last 168 hours. Cardiac Enzymes: No results for input(s): CKTOTAL, CKMB, CKMBINDEX, TROPONINI in the last 168 hours. BNP (last 3 results) No results for input(s): PROBNP in the last 8760 hours. HbA1C: No results for input(s): HGBA1C in the last 72 hours. CBG: Recent Labs  Lab 10/20/17 2124 10/21/17 0613  GLUCAP 119* 114*   Lipid Profile: No results for input(s): CHOL, HDL, LDLCALC, TRIG, CHOLHDL, LDLDIRECT in the last 72 hours. Thyroid Function Tests: No results for input(s): TSH, T4TOTAL, FREET4, T3FREE, THYROIDAB in the last 72 hours. Anemia Panel: No results for input(s): VITAMINB12, FOLATE, FERRITIN, TIBC, IRON, RETICCTPCT in the last 72 hours. Urine analysis:    Component Value Date/Time   COLORURINE YELLOW 10/20/2017 1657   APPEARANCEUR HAZY (A) 10/20/2017 1657   LABSPEC 1.045 (H) 10/20/2017 1657   PHURINE 7.0 10/20/2017 1657   GLUCOSEU 50 (A) 10/20/2017 1657   GLUCOSEU NEGATIVE 11/24/2010 1224   HGBUR NEGATIVE 10/20/2017 1657   BILIRUBINUR NEGATIVE 10/20/2017 1657   KETONESUR NEGATIVE 10/20/2017 1657   PROTEINUR NEGATIVE 10/20/2017 1657   UROBILINOGEN 0.2 05/05/2014 2123   NITRITE POSITIVE (A) 10/20/2017 1657   LEUKOCYTESUR MODERATE (A) 10/20/2017 1657     )No results found for this or any previous visit (from the past 240 hour(s)).    Anti-infectives (From admission, onward)   Start     Dose/Rate Route Frequency Ordered Stop   10/20/17 2000  cefTRIAXone (ROCEPHIN) 1 g in sodium chloride 0.9 % 100 mL IVPB     1 g 200 mL/hr over 30 Minutes Intravenous Every 24 hours 10/20/17 1848     10/20/17 1745  cefTRIAXone (ROCEPHIN) 1 g in sodium chloride 0.9 % 100 mL IVPB  Status:  Discontinued     1 g 200 mL/hr over 30 Minutes Intravenous Every 24 hours 10/20/17 1744 10/20/17 1848       Radiology Studies: Ct Head Wo Contrast  Result Date: 10/20/2017 CLINICAL DATA:  Syncope. EXAM: CT HEAD WITHOUT CONTRAST CT CERVICAL SPINE WITHOUT CONTRAST TECHNIQUE:  Multidetector CT imaging of the head and cervical spine was performed following the standard protocol without intravenous contrast. Multiplanar CT image reconstructions of the cervical spine were also generated. COMPARISON:  CT scan of September 26, 2017. FINDINGS: CT HEAD FINDINGS Brain: Mild diffuse cortical atrophy is noted. Moderate chronic ischemic white matter disease is noted. No mass effect or midline shift is noted. Ventricular size is within normal limits. There is no evidence of mass lesion, hemorrhage or acute infarction. Vascular: No hyperdense vessel or unexpected calcification. Skull: Normal. Negative for fracture or focal lesion. Sinuses/Orbits: No acute finding. Other: None. CT CERVICAL SPINE FINDINGS Alignment: Normal. Skull base and vertebrae: No acute fracture. No primary bone lesion or focal pathologic process. Soft tissues and spinal canal: No prevertebral fluid or swelling. No visible canal hematoma. Disc levels: Anterior osteophyte  formation is noted at C5-6 and C6-7. Upper chest: Negative. Other: None. IMPRESSION: Mild diffuse cortical atrophy. Moderate chronic ischemic white matter disease. No acute intracranial abnormality seen. Multilevel degenerative changes are noted. No acute abnormality seen in the cervical spine. Electronically Signed   By: Marijo Conception, M.D.   On: 10/20/2017 15:49   Ct Angio Chest Pe W And/or Wo Contrast  Result Date: 10/20/2017 CLINICAL DATA:  Syncopal episode today after getting out of the shower. Dizziness and upper back pain. EXAM: CT ANGIOGRAPHY CHEST WITH CONTRAST TECHNIQUE: Multidetector CT imaging of the chest was performed using the standard protocol during bolus administration of intravenous contrast. Multiplanar CT image reconstructions and MIPs were obtained to evaluate the vascular anatomy. CONTRAST:  66mL ISOVUE-370 IOPAMIDOL (ISOVUE-370) INJECTION 76% COMPARISON:  Chest radiographs 09/26/2017. Thoracic spine CT 08/11/2016. Chest CTA 07/17/2012.  FINDINGS: Cardiovascular: Pulmonary arterial opacification is adequate without evidence of emboli. The ascending aorta measures up to 3.8 cm in diameter, unchanged from 2013. Coronary artery atherosclerosis and mild cardiomegaly are noted. There is no pericardial effusion. Mediastinum/Nodes: No enlarged axillary, mediastinal, or hilar lymph nodes. Unremarkable esophagus. 8 mm calcification in the right thyroid lobe. Similar appearance of asymmetric right thyroid lobe enlargement extending posteriorly behind the esophagus. Lungs/Pleura: No pleural effusion or pneumothorax. Mild respiratory motion artifact with mild dependent atelectasis in both lungs. No mass. Upper Abdomen: Status post cholecystectomy. Musculoskeletal: No acute osseous abnormality. Healed posterior left fourth rib fractures, unchanged from 07/2016 though new from 2013. Review of the MIP images confirms the above findings. IMPRESSION: 1. No evidence of pulmonary emboli or other acute abnormality in the chest. 2. Aortic Atherosclerosis (ICD10-I70.0). Electronically Signed   By: Logan Bores M.D.   On: 10/20/2017 15:50   Ct Cervical Spine Wo Contrast  Result Date: 10/20/2017 CLINICAL DATA:  Syncope. EXAM: CT HEAD WITHOUT CONTRAST CT CERVICAL SPINE WITHOUT CONTRAST TECHNIQUE: Multidetector CT imaging of the head and cervical spine was performed following the standard protocol without intravenous contrast. Multiplanar CT image reconstructions of the cervical spine were also generated. COMPARISON:  CT scan of September 26, 2017. FINDINGS: CT HEAD FINDINGS Brain: Mild diffuse cortical atrophy is noted. Moderate chronic ischemic white matter disease is noted. No mass effect or midline shift is noted. Ventricular size is within normal limits. There is no evidence of mass lesion, hemorrhage or acute infarction. Vascular: No hyperdense vessel or unexpected calcification. Skull: Normal. Negative for fracture or focal lesion. Sinuses/Orbits: No acute finding.  Other: None. CT CERVICAL SPINE FINDINGS Alignment: Normal. Skull base and vertebrae: No acute fracture. No primary bone lesion or focal pathologic process. Soft tissues and spinal canal: No prevertebral fluid or swelling. No visible canal hematoma. Disc levels: Anterior osteophyte formation is noted at C5-6 and C6-7. Upper chest: Negative. Other: None. IMPRESSION: Mild diffuse cortical atrophy. Moderate chronic ischemic white matter disease. No acute intracranial abnormality seen. Multilevel degenerative changes are noted. No acute abnormality seen in the cervical spine. Electronically Signed   By: Marijo Conception, M.D.   On: 10/20/2017 15:49        Scheduled Meds: . amLODipine  5 mg Oral QHS  . apixaban  5 mg Oral BID  . atorvastatin  20 mg Oral Daily  . glipiZIDE  5 mg Oral QPM  . latanoprost  1 drop Both Eyes QHS  . oxybutynin  5 mg Oral Daily   Continuous Infusions: . cefTRIAXone (ROCEPHIN)  IV Stopped (10/20/17 2154)     LOS: 0 days  Time spent: 15 min    Geradine Girt, DO [facility] Pager [phone]  If 7PM-7AM, please contact night-coverage [email or web address] Password [facility] 10/21/2017, 12:27 PM

## 2017-10-22 ENCOUNTER — Observation Stay (HOSPITAL_COMMUNITY): Payer: Medicare Other

## 2017-10-22 DIAGNOSIS — R55 Syncope and collapse: Secondary | ICD-10-CM | POA: Diagnosis not present

## 2017-10-22 DIAGNOSIS — I482 Chronic atrial fibrillation: Secondary | ICD-10-CM | POA: Diagnosis not present

## 2017-10-22 DIAGNOSIS — E119 Type 2 diabetes mellitus without complications: Secondary | ICD-10-CM | POA: Diagnosis not present

## 2017-10-22 LAB — URINE CULTURE

## 2017-10-22 LAB — GLUCOSE, CAPILLARY: Glucose-Capillary: 130 mg/dL — ABNORMAL HIGH (ref 65–99)

## 2017-10-22 MED ORDER — POLYETHYLENE GLYCOL 3350 17 G PO PACK: 17.0000 g | PACK | Freq: Every day | ORAL | 0 refills | Status: DC | PRN

## 2017-10-22 MED ORDER — TRAMADOL HCL 50 MG PO TABS: 100.0000 mg | ORAL_TABLET | Freq: Three times a day (TID) | ORAL | 0 refills | Status: DC | PRN

## 2017-10-22 MED ORDER — CEPHALEXIN 500 MG PO CAPS: 500.0000 mg | ORAL_CAPSULE | Freq: Two times a day (BID) | ORAL | Status: DC

## 2017-10-22 MED ORDER — LINAGLIPTIN 5 MG PO TABS: 5.0000 mg | ORAL_TABLET | Freq: Every day | ORAL | Status: DC

## 2017-10-22 MED ORDER — NITROGLYCERIN 0.4 MG SL SUBL: 0.4000 mg | SUBLINGUAL_TABLET | SUBLINGUAL | 12 refills | Status: AC | PRN

## 2017-10-22 MED ORDER — CEPHALEXIN 500 MG PO CAPS
500.0000 mg | ORAL_CAPSULE | Freq: Two times a day (BID) | ORAL | Status: DC
Start: 2017-10-22 — End: 2017-10-22
  Administered 2017-10-22: 500 mg via ORAL
  Filled 2017-10-22 (×2): qty 1

## 2017-10-22 MED ORDER — DICLOFENAC SODIUM 1 % TD GEL: 2.0000 g | Freq: Four times a day (QID) | TRANSDERMAL | Status: AC

## 2017-10-22 MED ORDER — DICLOFENAC SODIUM 1 % TD GEL
2.0000 g | Freq: Four times a day (QID) | TRANSDERMAL | Status: DC
  Administered 2017-10-22: 19:00:00 2 g via TOPICAL
  Filled 2017-10-22: qty 100

## 2017-10-22 NOTE — Discharge Summary (Addendum)
Physician Discharge Summary  [name] HGD:[id number] DOB: Dec 19, 1923 DOA: 10/20/2017  PCP: Chesley Noon, MD  Admit date: 10/20/2017 Discharge date: 10/22/2017   Recommendations for Outpatient Follow-Up:   1. Keflex through 4/5 then stop 2. encourage patient not to use depends if possible 3. Bowel regimen 4. Outpatient follow up with Dr. Harrington Challenger 5. X ray of wrist shows arthritis: PRN pain meds/voltaren gel   Discharge Diagnosis:   Principal Problem:   Syncope Active Problems:   HLD (hyperlipidemia)   COPD, moderate (HCC)   Coronary artery disease   Atrial fibrillation, permanent   Diabetes mellitus type II, non insulin dependent (HCC)   HTN (hypertension)   Depression   Discharge disposition:  SNF:  Discharge Condition: Improved.  Diet recommendation: Low sodium, heart healthy.  Carbohydrate-modified.  Wound care: None.   History of Present Illness:   [name] is an 84 y.o. femalewith a past medical history of hypertension, atrial fibrillation, coronary artery disease, type 2 diabetes who is being admitted for syncope.  Patient has a previous history of syncope with multiple admissions in the past which has been attributed to postural hypotension and or bradycardia and or sinus pause. Patient was seen by electrophysiologist last year and syncope was thought to be vagally mediated. Patient was not considered a candidate for pacemaker at that time.   Today patient states that she was taking a hot shower and when she came out of the shower she had syncope. She does not remember falling but when she came to she was lying by the commode. Patient states that she keeps her phone with her always in a place on the commode so she was able to call 911. Patient is not sure if she hit her head however she says she has no head pain. Instead she says she has significant right back pain and assumes that she fell on her back.  Patient states this syncopal episode  was similar to previous episodes. She has unaware of when she is going to pass out. She has no sensation of darkness or faintness. She denies any chest pain, shortness of breath or lightheadedness. At present she denies any headaches. She denies losing her urine or biting her tongue. Patient states that she knew immediately where she was when she woke up and was not confused at all.  Review of systems is positive for some dysuria for the past 12 hours. No fevers or chills. No new diarrhea, melena, hematemesis or vomiting.      Hospital Course by Problem:   SYNCOPE Patient has been previously admitted for multiple episodes of syncope in the past. She's completed a workup which has revealed a combination of postural hypotension, bradycardia with possible chronotropic incompetence, possible sinus pauses and other vasovagal mechanisms. -has worn holter in past with out issues found--appears for 48 hours -needs to follow up with cardiology (ross) -appeared to be dehydrated from UTI leading to syncope  UTI- recent culture showed e coli (09/2017) IV rocephin--Change to PO for total of 5 days  BACK INJURY  Imaging without evidence of fracture  DM Change to tradjenta  HTN Continue amlodipine  ATRIAL FIBRILLATION -not on any rate control medications secondary to previous history of bradycardia and sinus pauses -continue patient's Eliquis as per home doses, however this decision to continue this patient with history of multiple episodes of syncope on Eliquis can be revisited at her PCPs.       Medical Consultants:    None.  Discharge Exam:   Vitals:   10/22/17 1200 10/22/17 1659  BP: 131/61 (!) 144/74  Pulse: 80 67  Resp: 18   Temp: 98.9 F (37.2 C)   SpO2: 97% 99%   Vitals:   10/22/17 0522 10/22/17 0900 10/22/17 1200 10/22/17 1659  BP: 121/68 (!) 134/98 131/61 (!) 144/74  Pulse: 90 78 80 67  Resp: 20 19 18    Temp: 98.6 F (37 C) 99.5 F (37.5 C) 98.9 F  (37.2 C)   TempSrc: Oral Oral Oral   SpO2: 96% 97% 97% 99%  Weight: 78 kg (172 lb)     Height:        Gen:  NAD   The results of significant diagnostics from this hospitalization (including imaging, microbiology, ancillary and laboratory) are listed below for reference.     Procedures and Diagnostic Studies:   Ct Head Wo Contrast  Result Date: 10/20/2017 CLINICAL DATA:  Syncope. EXAM: CT HEAD WITHOUT CONTRAST CT CERVICAL SPINE WITHOUT CONTRAST TECHNIQUE: Multidetector CT imaging of the head and cervical spine was performed following the standard protocol without intravenous contrast. Multiplanar CT image reconstructions of the cervical spine were also generated. COMPARISON:  CT scan of September 26, 2017. FINDINGS: CT HEAD FINDINGS Brain: Mild diffuse cortical atrophy is noted. Moderate chronic ischemic white matter disease is noted. No mass effect or midline shift is noted. Ventricular size is within normal limits. There is no evidence of mass lesion, hemorrhage or acute infarction. Vascular: No hyperdense vessel or unexpected calcification. Skull: Normal. Negative for fracture or focal lesion. Sinuses/Orbits: No acute finding. Other: None. CT CERVICAL SPINE FINDINGS Alignment: Normal. Skull base and vertebrae: No acute fracture. No primary bone lesion or focal pathologic process. Soft tissues and spinal canal: No prevertebral fluid or swelling. No visible canal hematoma. Disc levels: Anterior osteophyte formation is noted at C5-6 and C6-7. Upper chest: Negative. Other: None. IMPRESSION: Mild diffuse cortical atrophy. Moderate chronic ischemic white matter disease. No acute intracranial abnormality seen. Multilevel degenerative changes are noted. No acute abnormality seen in the cervical spine. Electronically Signed   By: Marijo Conception, M.D.   On: 10/20/2017 15:49   Ct Angio Chest Pe W And/or Wo Contrast  Result Date: 10/20/2017 CLINICAL DATA:  Syncopal episode today after getting out of the  shower. Dizziness and upper back pain. EXAM: CT ANGIOGRAPHY CHEST WITH CONTRAST TECHNIQUE: Multidetector CT imaging of the chest was performed using the standard protocol during bolus administration of intravenous contrast. Multiplanar CT image reconstructions and MIPs were obtained to evaluate the vascular anatomy. CONTRAST:  39mL ISOVUE-370 IOPAMIDOL (ISOVUE-370) INJECTION 76% COMPARISON:  Chest radiographs 09/26/2017. Thoracic spine CT 08/11/2016. Chest CTA 07/17/2012. FINDINGS: Cardiovascular: Pulmonary arterial opacification is adequate without evidence of emboli. The ascending aorta measures up to 3.8 cm in diameter, unchanged from 2013. Coronary artery atherosclerosis and mild cardiomegaly are noted. There is no pericardial effusion. Mediastinum/Nodes: No enlarged axillary, mediastinal, or hilar lymph nodes. Unremarkable esophagus. 8 mm calcification in the right thyroid lobe. Similar appearance of asymmetric right thyroid lobe enlargement extending posteriorly behind the esophagus. Lungs/Pleura: No pleural effusion or pneumothorax. Mild respiratory motion artifact with mild dependent atelectasis in both lungs. No mass. Upper Abdomen: Status post cholecystectomy. Musculoskeletal: No acute osseous abnormality. Healed posterior left fourth rib fractures, unchanged from 07/2016 though new from 2013. Review of the MIP images confirms the above findings. IMPRESSION: 1. No evidence of pulmonary emboli or other acute abnormality in the chest. 2. Aortic Atherosclerosis (ICD10-I70.0). Electronically Signed  By: Logan Bores M.D.   On: 10/20/2017 15:50   Ct Cervical Spine Wo Contrast  Result Date: 10/20/2017 CLINICAL DATA:  Syncope. EXAM: CT HEAD WITHOUT CONTRAST CT CERVICAL SPINE WITHOUT CONTRAST TECHNIQUE: Multidetector CT imaging of the head and cervical spine was performed following the standard protocol without intravenous contrast. Multiplanar CT image reconstructions of the cervical spine were also  generated. COMPARISON:  CT scan of September 26, 2017. FINDINGS: CT HEAD FINDINGS Brain: Mild diffuse cortical atrophy is noted. Moderate chronic ischemic white matter disease is noted. No mass effect or midline shift is noted. Ventricular size is within normal limits. There is no evidence of mass lesion, hemorrhage or acute infarction. Vascular: No hyperdense vessel or unexpected calcification. Skull: Normal. Negative for fracture or focal lesion. Sinuses/Orbits: No acute finding. Other: None. CT CERVICAL SPINE FINDINGS Alignment: Normal. Skull base and vertebrae: No acute fracture. No primary bone lesion or focal pathologic process. Soft tissues and spinal canal: No prevertebral fluid or swelling. No visible canal hematoma. Disc levels: Anterior osteophyte formation is noted at C5-6 and C6-7. Upper chest: Negative. Other: None. IMPRESSION: Mild diffuse cortical atrophy. Moderate chronic ischemic white matter disease. No acute intracranial abnormality seen. Multilevel degenerative changes are noted. No acute abnormality seen in the cervical spine. Electronically Signed   By: Marijo Conception, M.D.   On: 10/20/2017 15:49     Labs:   Basic Metabolic Panel: Recent Labs  Lab 10/20/17 1327 10/21/17 0247  NA 135 139  K 4.1 4.8  CL 98* 104  CO2 26 26  GLUCOSE 257* 133*  BUN 13 11  CREATININE 1.24* 0.97  CALCIUM 8.7* 8.8*   GFR Estimated Creatinine Clearance: 37.3 mL/min (by C-G formula based on SCr of 0.97 mg/dL). Liver Function Tests: Recent Labs  Lab 10/20/17 1327  AST 25  ALT 20  ALKPHOS 61  BILITOT 0.7  PROT 6.1*  ALBUMIN 3.3*   No results for input(s): LIPASE, AMYLASE in the last 168 hours. No results for input(s): AMMONIA in the last 168 hours. Coagulation profile No results for input(s): INR, PROTIME in the last 168 hours.  CBC: Recent Labs  Lab 10/20/17 1327 10/21/17 0247  WBC 5.3 5.1  NEUTROABS 3.8  --   HGB 13.0 12.5  HCT 40.3 38.2  MCV 94.6 94.6  PLT 194 173   Cardiac  Enzymes: No results for input(s): CKTOTAL, CKMB, CKMBINDEX, TROPONINI in the last 168 hours. BNP: Invalid input(s): POCBNP CBG: Recent Labs  Lab 10/20/17 2124 10/21/17 0613 10/22/17 0519  GLUCAP 119* 114* 130*   D-Dimer No results for input(s): DDIMER in the last 72 hours. Hgb A1c No results for input(s): HGBA1C in the last 72 hours. Lipid Profile No results for input(s): CHOL, HDL, LDLCALC, TRIG, CHOLHDL, LDLDIRECT in the last 72 hours. Thyroid function studies No results for input(s): TSH, T4TOTAL, T3FREE, THYROIDAB in the last 72 hours.  Invalid input(s): FREET3 Anemia work up No results for input(s): VITAMINB12, FOLATE, FERRITIN, TIBC, IRON, RETICCTPCT in the last 72 hours. Microbiology Recent Results (from the past 240 hour(s))  Urine culture     Status: Abnormal   Collection Time: 10/20/17  4:27 PM  Result Value Ref Range Status   Specimen Description URINE, RANDOM  Final   Special Requests   Final    NONE Performed at [facility], [address] [place], [place] [address]    Culture >=100,000 COLONIES/mL ESCHERICHIA COLI (A)  Final   Report Status 10/22/2017 FINAL  Final  Organism ID, Bacteria ESCHERICHIA COLI (A)  Final      Susceptibility   Escherichia coli - MIC*    AMPICILLIN >=32 RESISTANT Resistant     CEFAZOLIN <=4 SENSITIVE Sensitive     CEFTRIAXONE <=1 SENSITIVE Sensitive     CIPROFLOXACIN <=0.25 SENSITIVE Sensitive     GENTAMICIN <=1 SENSITIVE Sensitive     IMIPENEM <=0.25 SENSITIVE Sensitive     NITROFURANTOIN <=16 SENSITIVE Sensitive     TRIMETH/SULFA <=20 SENSITIVE Sensitive     AMPICILLIN/SULBACTAM >=32 RESISTANT Resistant     PIP/TAZO <=4 SENSITIVE Sensitive     Extended ESBL NEGATIVE Sensitive     * >=100,000 COLONIES/mL ESCHERICHIA COLI     Discharge Instructions:   Discharge Instructions    Diet - low sodium heart healthy   Complete by:  As directed    Diet Carb Modified   Complete by:  As directed    Increase activity  slowly   Complete by:  As directed      Allergies as of 10/22/2017      Reactions   Amoxicillin Other (See Comments)   UNKNOWN      Medication List    STOP taking these medications   gabapentin 600 MG tablet Commonly known as:  NEURONTIN   glipiZIDE 5 MG 24 hr tablet Commonly known as:  GLUCOTROL XL     TAKE these medications   amLODipine 5 MG tablet Commonly known as:  NORVASC Take 5 mg by mouth at bedtime.   apixaban 5 MG Tabs tablet Commonly known as:  ELIQUIS Take 1 tablet (5 mg total) by mouth 2 (two) times daily.   atorvastatin 20 MG tablet Commonly known as:  LIPITOR Take 20 mg by mouth daily at 6 PM.   CALCIUM 500/D PO Take 1 tablet by mouth 2 (two) times daily.   cephALEXin 500 MG capsule Commonly known as:  KEFLEX Take 1 capsule (500 mg total) by mouth every 12 (twelve) hours.   diclofenac sodium 1 % Gel Commonly known as:  VOLTAREN Apply 2 g topically 4 (four) times daily.   linagliptin 5 MG Tabs tablet Commonly known as:  TRADJENTA Take 1 tablet (5 mg total) by mouth daily.   LUMIGAN 0.01 % Soln Generic drug:  bimatoprost Place 1 drop into both eyes at bedtime.   MULTIVITAMIN PO Take 1 tablet by mouth daily.   PRESERVISION AREDS PO Take 1 capsule by mouth 2 (two) times daily.   nitroGLYCERIN 0.4 MG SL tablet Commonly known as:  NITROSTAT Place 1 tablet (0.4 mg total) under the tongue every 5 (five) minutes as needed for chest pain (Max 3 doses). What changed:  how much to take   oxybutynin 5 MG tablet Commonly known as:  DITROPAN Take 5 mg by mouth daily.   polyethylene glycol packet Commonly known as:  MIRALAX / GLYCOLAX Take 17 g by mouth daily as needed for mild constipation.   traMADol 50 MG tablet Commonly known as:  ULTRAM Take 2 tablets (100 mg total) by mouth every 8 (eight) hours as needed for severe pain.       Contact information for follow-up providers    Chesley Noon, MD Follow up.   Specialty:  Family  Medicine Why:  when released from SNF Contact information: [place] [address] [phone]            Contact information for after-discharge care    [facility] .  Service:  Skilled Nursing Contact information: [address] [place] [phone]                   Time coordinating discharge: 35 min  Signed:  Geradine Girt   [facility] 10/22/2017, 5:45 PM

## 2017-10-22 NOTE — Progress Notes (Signed)
Pt is screaming in pain of Right wrist uncontrolled with tramadol.. Asked for a xray and brace.

## 2017-10-22 NOTE — Progress Notes (Signed)
Patient alert orent transportation her to pick up up iv iv out tele off.

## 2017-10-22 NOTE — Clinical Social Work Placement (Signed)
   CLINICAL SOCIAL WORK PLACEMENT  NOTE 10/22/17 - DISCHARGED TO [facility]  Date:  10/22/2017  Patient Details  Name: [name] MRN: [id number] Date of Birth: 28-Aug-1923  Clinical Social Work is seeking post-discharge placement for this patient at the [place] level of care (*CSW will initial, date and re-position this form in  chart as items are completed):  Yes   Patient/family provided with [place] Work Department's list of facilities offering this level of care within the geographic area requested by the patient (or if unable, by the patient's family).  Yes   Patient/family informed of their freedom to choose among providers that offer the needed level of care, that participate in Medicare, Medicaid or managed care program needed by the patient, have an available bed and are willing to accept the patient.  Yes   Patient/family informed of [facility] [facility]'s ownership interest in [facility] and [facility], as well as of the fact that they are under no obligation to receive care at these facilities.  PASRR submitted to EDS on       PASRR number received on       Existing PASRR number confirmed on 10/22/17     FL2 transmitted to all facilities in geographic area requested by pt/family on 10/22/17     FL2 transmitted to all facilities within larger geographic area on       Patient informed that his/her managed care company has contracts with or will negotiate with certain facilities, including the following:        Yes   Patient/family informed of bed offers received.  Patient chooses bed at [facility]     Physician recommends and patient chooses bed at      Patient to be transferred to [facility] on 10/22/17.  Patient to be transferred to facility by Ambulance     Patient family notified on 10/22/17 of transfer.  Name of family member notified:  Elbert Ewings - granddaughter ([place]); [phone]       PHYSICIAN      Additional Comment:  10/22/17 - [facility] received insurance authorization from [facility].   _______________________________________________ Sable Feil, LCSW 10/22/2017, 5:25 PM

## 2017-10-22 NOTE — Progress Notes (Signed)
Repot given to Clovis Pu at [facility]

## 2017-10-22 NOTE — Progress Notes (Signed)
Patient c/o wrist pain that started today.  DG xray pending.   [name]

## 2017-10-22 NOTE — Clinical Social Work Note (Signed)
Clinical Social Work Assessment  Patient Details  Name: [name] MRN: [id number] Date of Birth: February 04, 1924  Date of referral:  10/22/17               Reason for consult:  Discharge Planning, Facility Placement                Permission sought to share information with:  Family Supports, Customer service manager Permission granted to share information::  Yes, Verbal Permission Granted  Name::     Elbert Ewings  Agency::  [facility]   Relationship::  Designer, fashion/clothing Information:  [phone]  Housing/Transportation Living arrangements for the past 2 months:  [place] of Information:  Power of Attorney Photographer) is [employer]) Patient Interpreter Needed:  None Criminal Activity/Legal Involvement Pertinent to Current Situation/Hospitalization:  No - Comment as needed Significant Relationships:  Other Family Members, Adult Children Lives with:  Self Do you feel safe going back to the place where you live?  No Need for family participation in patient care:  No (Coment)  Care giving concerns: Granddaughter [name] is in agreement with the recommendation of short term rehab, as her grandmother lives alone.    Social Worker assessment / plan: CSW intern spoke with [name] via phone call regarding discharge disposition and facility placement for patient. [name] was pleasant and engaged in the conversation. [name] informed CSW intern that she is the POA and will bring in the paperwork today. [name] stated that her grandmother went to [facility] for short term rehab in 2017 and wants to go back to [facility]. Mrs Clydene Shepherd went on to inform CSW intern that after patient has finished short term rehab, she may go stay with her daughter who will provide 24/7 care. CSW intern informed [name] that if [facility] does not have beds available, a second choice will be needed. [name] agreed for CSW to e-mail facility list to her and once Ms.  Carew's info is sent out CSW intern will inform [name] of bed offers.   Employment status:  Retired Forensic scientist:  Medicaid In [place], [place] PT Recommendations:  [place] / Referral to community resources:  [place]  Patient/Family's Response to care: [name] expressed no concerns regarding care during hospitalization.   Patient/Family's Understanding of and Emotional Response to Diagnosis, Current Treatment, and Prognosis: [name] is understanding of Ms. Kang's need for short term rehab and has agreed for patient to receive services at a skilled nursing facility.   Emotional Assessment Appearance:  Other (Comment Required(Not assess at this time. Patient is oriented to self only. ) Attitude/Demeanor/Rapport:  Unable to Assess Affect (typically observed):  Unable to Assess(Patient oriented to self only. ) Orientation:  Oriented to Self Alcohol / Substance use:  Never Used Psych involvement (Current and /or in the community):  No (Comment)  Discharge Needs  Concerns to be addressed:  No discharge needs identified Readmission within the last 30 days:  No Current discharge risk:  Lives alone Barriers to Discharge:  No Barriers Identified   Ernesto Rutherford, Student-Social Work 10/22/2017, 12:25 PM

## 2017-10-22 NOTE — NC FL2 (Signed)
[place] LEVEL OF CARE SCREENING TOOL     IDENTIFICATION  Patient Name: [name] Birthdate: [birth year] Sex: female Admission Date (Current Location): 10/20/2017  [place] and [place] Number:  Kathleen Argue [id number] T Facility and Address:  [facility] [place], [place] [address], [place], [place] [address]      Provider Number: [id number]  Attending Physician Name and Address:  Geradine Girt, DO  Relative Name and Phone Number:  Rosalie Gums) [phone]    Current Level of Care: Home Recommended Level of Care: [place] Prior Approval Number:    Date Approved/Denied: 03/10/16 PASRR Number: [id number]  Discharge Plan: SNF    Current Diagnoses: Patient Active Problem List   Diagnosis Date Noted  . Syncope 02/17/2017  . PVC's (premature ventricular contractions) 03/07/2016  . Depression 03/06/2016  . Chest pain at rest 03/06/2016  . Unstable angina ([place]) 10/24/2015  . Chronic anticoagulation for A fib, CHA2DS2VASc = 6 12/18/2014  . Chest pain, negative MI, negative Nuc, may be GI 12/17/2014  . Diabetes mellitus type II, non insulin dependent ([place])   . HTN (hypertension)   . Tachycardia 08/07/2011  . Atrial fibrillation, permanent 06/25/2011  . Near Syncope 06/06/2011  . Coronary artery disease 04/05/2011  . Bradycardia 12/09/2009  . ACUTE BRONCHITIS 06/17/2009  . COPD, moderate ([place]) 06/17/2009  . PULMONARY NODULE, RIGHT UPPER LOBE 06/07/2009  . SHORTNESS OF BREATH (SOB) 06/07/2009  . POSTURAL HYPOTENSION 05/23/2009  . DEHYDRATION 05/13/2009  . ABDOMINAL PAIN, LOWER 01/21/2009  . HOARSENESS, CHRONIC 12/03/2007  . GOITER 09/30/2007  . DIABETIC PERIPHERAL NEUROPATHY 09/30/2007  . OBESITY 09/30/2007  . HIATAL HERNIA 09/30/2007  . OSTEOARTHRITIS 09/30/2007  . SPINAL STENOSIS 09/30/2007  . PERIPHERAL EDEMA 09/30/2007  . HLD (hyperlipidemia) 02/12/2007  . ANEMIA-NOS 02/12/2007  . Neuropathy ([place])  02/12/2007  . Allergic rhinitis, cause unspecified 02/12/2007  . GERD 02/12/2007  . DIVERTICULOSIS, COLON 02/12/2007    Orientation RESPIRATION BLADDER Height & Weight     Self  Normal External catheter Weight: 172 lb (78 kg) Height:  5\' 3"  (160 cm)  BEHAVIORAL SYMPTOMS/MOOD NEUROLOGICAL BOWEL NUTRITION STATUS      Continent Diet(Heart healthy/carb modified. Fluid consistency- Thin.)  AMBULATORY STATUS COMMUNICATION OF NEEDS Skin   Limited Assist(4 wheel walker used.) Verbally Normal                       Personal Care Assistance Level of Assistance  Bathing, Feeding, Dressing Bathing Assistance: Limited assistance Feeding assistance: Independent Dressing Assistance: Limited assistance     Functional Limitations Info  Sight, Hearing, Speech Sight Info: Adequate Hearing Info: Impaired(impaired hearing in both ears. ) Speech Info: Adequate    SPECIAL CARE FACTORS FREQUENCY                       Contractures Contractures Info: Not present    Additional Factors Info  Code Status, Allergies Code Status Info: Full Code Allergies Info: Amoxicillin            Current Medications (10/22/2017):  This is the current hospital active medication list Current Facility-Administered Medications  Medication Dose Route Frequency Provider Last Rate Last Dose  . acetaminophen (TYLENOL) tablet 650 mg  650 mg Oral Q6H PRN Vashti Hey, MD       Or  . acetaminophen (TYLENOL) suppository 650 mg  650 mg Rectal Q6H PRN Vashti Hey, MD      . amLODipine (NORVASC) tablet  5 mg  5 mg Oral QHS Bonnell Public Tublu, MD   5 mg at 10/21/17 2121  . apixaban (ELIQUIS) tablet 5 mg  5 mg Oral BID Bonnell Public Tublu, MD   5 mg at 10/22/17 0277  . atorvastatin (LIPITOR) tablet 20 mg  20 mg Oral Daily Bonnell Public Tublu, MD   20 mg at 10/22/17 0927  . bisacodyl (DULCOLAX) EC tablet 5 mg  5 mg Oral Daily PRN Bonnell Public Tublu, MD      .  cephALEXin (KEFLEX) capsule 500 mg  500 mg Oral Q12H Vann, Jessica U, DO   500 mg at 10/22/17 4128  . latanoprost (XALATAN) 0.005 % ophthalmic solution 1 drop  1 drop Both Eyes QHS Vashti Hey, MD   1 drop at 10/21/17 2122  . linagliptin (TRADJENTA) tablet 5 mg  5 mg Oral Daily Eulogio Bear U, DO   5 mg at 10/22/17 7867  . nitroGLYCERIN (NITROSTAT) SL tablet 0.4 mg  0.4 mg Sublingual Q5 min PRN Bonnell Public Tublu, MD      . ondansetron [facility]) tablet 4 mg  4 mg Oral Q6H PRN Vashti Hey, MD       Or  . ondansetron [facility]) injection 4 mg  4 mg Intravenous Q6H PRN Bonnell Public Tublu, MD      . oxybutynin (DITROPAN) tablet 5 mg  5 mg Oral Daily Bonnell Public Tublu, MD   5 mg at 10/22/17 6720  . polyethylene glycol (MIRALAX / GLYCOLAX) packet 17 g  17 g Oral Daily PRN Bonnell Public Tublu, MD      . traMADol Veatrice Bourbon) tablet 100 mg  100 mg Oral Q8H PRN Vashti Hey, MD   100 mg at 10/22/17 1222     Discharge Medications: Please see discharge summary for a list of discharge medications.  Relevant Imaging Results:  Relevant Lab Results:   Additional Information ssn# [id number]  Mikle Bosworth Work [phone]

## 2018-01-15 ENCOUNTER — Emergency Department (HOSPITAL_COMMUNITY): Payer: Medicare Other

## 2018-01-15 ENCOUNTER — Encounter (HOSPITAL_COMMUNITY): Payer: Self-pay | Admitting: Radiology

## 2018-01-15 ENCOUNTER — Other Ambulatory Visit: Payer: Self-pay

## 2018-01-15 ENCOUNTER — Observation Stay (HOSPITAL_COMMUNITY)
Admission: EM | Admit: 2018-01-15 | Discharge: 2018-01-18 | Disposition: A | Discharge status: Skilled Nursing Facility | Payer: Medicare Other | Attending: Internal Medicine | Admitting: Internal Medicine

## 2018-01-15 DIAGNOSIS — E1122 Type 2 diabetes mellitus with diabetic chronic kidney disease: Secondary | ICD-10-CM | POA: Insufficient documentation

## 2018-01-15 DIAGNOSIS — R9082 White matter disease, unspecified: Secondary | ICD-10-CM | POA: Insufficient documentation

## 2018-01-15 DIAGNOSIS — N390 Urinary tract infection, site not specified: Secondary | ICD-10-CM | POA: Insufficient documentation

## 2018-01-15 DIAGNOSIS — E669 Obesity, unspecified: Secondary | ICD-10-CM | POA: Diagnosis not present

## 2018-01-15 DIAGNOSIS — Z7984 Long term (current) use of oral hypoglycemic drugs: Secondary | ICD-10-CM | POA: Insufficient documentation

## 2018-01-15 DIAGNOSIS — F0390 Unspecified dementia without behavioral disturbance: Secondary | ICD-10-CM | POA: Insufficient documentation

## 2018-01-15 DIAGNOSIS — I493 Ventricular premature depolarization: Secondary | ICD-10-CM | POA: Diagnosis not present

## 2018-01-15 DIAGNOSIS — H42 Glaucoma in diseases classified elsewhere: Secondary | ICD-10-CM | POA: Insufficient documentation

## 2018-01-15 DIAGNOSIS — Y92009 Unspecified place in unspecified non-institutional (private) residence as the place of occurrence of the external cause: Secondary | ICD-10-CM | POA: Diagnosis not present

## 2018-01-15 DIAGNOSIS — M25552 Pain in left hip: Secondary | ICD-10-CM | POA: Insufficient documentation

## 2018-01-15 DIAGNOSIS — I252 Old myocardial infarction: Secondary | ICD-10-CM | POA: Insufficient documentation

## 2018-01-15 DIAGNOSIS — W19XXXA Unspecified fall, initial encounter: Secondary | ICD-10-CM | POA: Diagnosis not present

## 2018-01-15 DIAGNOSIS — K219 Gastro-esophageal reflux disease without esophagitis: Secondary | ICD-10-CM | POA: Insufficient documentation

## 2018-01-15 DIAGNOSIS — N183 Chronic kidney disease, stage 3 (moderate): Secondary | ICD-10-CM | POA: Diagnosis not present

## 2018-01-15 DIAGNOSIS — I4510 Unspecified right bundle-branch block: Secondary | ICD-10-CM | POA: Insufficient documentation

## 2018-01-15 DIAGNOSIS — Z8249 Family history of ischemic heart disease and other diseases of the circulatory system: Secondary | ICD-10-CM | POA: Insufficient documentation

## 2018-01-15 DIAGNOSIS — B9620 Unspecified Escherichia coli [E. coli] as the cause of diseases classified elsewhere: Secondary | ICD-10-CM | POA: Diagnosis not present

## 2018-01-15 DIAGNOSIS — Z79899 Other long term (current) drug therapy: Secondary | ICD-10-CM | POA: Insufficient documentation

## 2018-01-15 DIAGNOSIS — I25110 Atherosclerotic heart disease of native coronary artery with unstable angina pectoris: Secondary | ICD-10-CM | POA: Insufficient documentation

## 2018-01-15 DIAGNOSIS — Z7901 Long term (current) use of anticoagulants: Secondary | ICD-10-CM | POA: Insufficient documentation

## 2018-01-15 DIAGNOSIS — G9341 Metabolic encephalopathy: Secondary | ICD-10-CM | POA: Diagnosis not present

## 2018-01-15 DIAGNOSIS — M069 Rheumatoid arthritis, unspecified: Secondary | ICD-10-CM | POA: Insufficient documentation

## 2018-01-15 DIAGNOSIS — I482 Chronic atrial fibrillation: Secondary | ICD-10-CM

## 2018-01-15 DIAGNOSIS — N3281 Overactive bladder: Secondary | ICD-10-CM | POA: Diagnosis not present

## 2018-01-15 DIAGNOSIS — Z6830 Body mass index (BMI) 30.0-30.9, adult: Secondary | ICD-10-CM | POA: Insufficient documentation

## 2018-01-15 DIAGNOSIS — I5032 Chronic diastolic (congestive) heart failure: Secondary | ICD-10-CM | POA: Insufficient documentation

## 2018-01-15 DIAGNOSIS — M810 Age-related osteoporosis without current pathological fracture: Secondary | ICD-10-CM | POA: Diagnosis not present

## 2018-01-15 DIAGNOSIS — I10 Essential (primary) hypertension: Secondary | ICD-10-CM | POA: Diagnosis not present

## 2018-01-15 DIAGNOSIS — K589 Irritable bowel syndrome without diarrhea: Secondary | ICD-10-CM | POA: Insufficient documentation

## 2018-01-15 DIAGNOSIS — I440 Atrioventricular block, first degree: Secondary | ICD-10-CM | POA: Insufficient documentation

## 2018-01-15 DIAGNOSIS — M6282 Rhabdomyolysis: Secondary | ICD-10-CM | POA: Insufficient documentation

## 2018-01-15 DIAGNOSIS — Z1611 Resistance to penicillins: Secondary | ICD-10-CM | POA: Insufficient documentation

## 2018-01-15 DIAGNOSIS — M25551 Pain in right hip: Secondary | ICD-10-CM | POA: Diagnosis not present

## 2018-01-15 DIAGNOSIS — J449 Chronic obstructive pulmonary disease, unspecified: Secondary | ICD-10-CM | POA: Insufficient documentation

## 2018-01-15 DIAGNOSIS — G3189 Other specified degenerative diseases of nervous system: Secondary | ICD-10-CM | POA: Insufficient documentation

## 2018-01-15 DIAGNOSIS — I130 Hypertensive heart and chronic kidney disease with heart failure and stage 1 through stage 4 chronic kidney disease, or unspecified chronic kidney disease: Secondary | ICD-10-CM | POA: Diagnosis not present

## 2018-01-15 DIAGNOSIS — E785 Hyperlipidemia, unspecified: Secondary | ICD-10-CM | POA: Diagnosis not present

## 2018-01-15 DIAGNOSIS — Z880 Allergy status to penicillin: Secondary | ICD-10-CM | POA: Insufficient documentation

## 2018-01-15 DIAGNOSIS — M47816 Spondylosis without myelopathy or radiculopathy, lumbar region: Secondary | ICD-10-CM | POA: Insufficient documentation

## 2018-01-15 DIAGNOSIS — E1139 Type 2 diabetes mellitus with other diabetic ophthalmic complication: Secondary | ICD-10-CM | POA: Diagnosis not present

## 2018-01-15 DIAGNOSIS — E1142 Type 2 diabetes mellitus with diabetic polyneuropathy: Secondary | ICD-10-CM | POA: Insufficient documentation

## 2018-01-15 DIAGNOSIS — Z8589 Personal history of malignant neoplasm of other organs and systems: Secondary | ICD-10-CM | POA: Insufficient documentation

## 2018-01-15 DIAGNOSIS — R262 Difficulty in walking, not elsewhere classified: Secondary | ICD-10-CM | POA: Diagnosis not present

## 2018-01-15 DIAGNOSIS — Z955 Presence of coronary angioplasty implant and graft: Secondary | ICD-10-CM | POA: Insufficient documentation

## 2018-01-15 LAB — BASIC METABOLIC PANEL
Anion gap: 10 (ref 5–15)
BUN: 14 mg/dL (ref 8–23)
CALCIUM: 8.9 mg/dL (ref 8.9–10.3)
CO2: 29 mmol/L (ref 22–32)
CREATININE: 0.77 mg/dL (ref 0.44–1.00)
Chloride: 99 mmol/L (ref 98–111)
GFR calc non Af Amer: >60 mL/min (ref >60)
GLUCOSE: 163 mg/dL — AB (ref 70–99)
Potassium: 4 mmol/L (ref 3.5–5.1)
Sodium: 138 mmol/L (ref 135–145)

## 2018-01-15 LAB — CK: Total CK: 3241 U/L — ABNORMAL HIGH (ref 38–234)

## 2018-01-15 LAB — CBC
HEMATOCRIT: 39.7 % (ref 36.0–46.0)
HEMOGLOBIN: 13.4 g/dL (ref 12.0–15.0)
MCH: 31 pg (ref 26.0–34.0)
MCHC: 33.8 g/dL (ref 30.0–36.0)
MCV: 91.9 fL (ref 78.0–100.0)
Platelets: 321 10*3/uL (ref 150–400)
RBC: 4.32 MIL/uL (ref 3.87–5.11)
RDW: 14.5 % (ref 11.5–15.5)
WBC: 11.3 10*3/uL — AB (ref 4.0–10.5)

## 2018-01-15 LAB — URINALYSIS, ROUTINE W REFLEX MICROSCOPIC
Bilirubin Urine: NEGATIVE
Glucose, UA: NEGATIVE mg/dL
Ketones, ur: 20 mg/dL — AB
Nitrite: POSITIVE — AB
PH: 6 (ref 5.0–8.0)
Protein, ur: 30 mg/dL — AB
SPECIFIC GRAVITY, URINE: 1.014 (ref 1.005–1.030)

## 2018-01-15 LAB — HEMOGLOBIN A1C
HEMOGLOBIN A1C: 6.2 % — AB (ref 4.8–5.6)
Mean Plasma Glucose: 131.24 mg/dL

## 2018-01-15 LAB — GLUCOSE, CAPILLARY: GLUCOSE-CAPILLARY: 185 mg/dL — AB (ref 70–99)

## 2018-01-15 MED ORDER — TRAZODONE HCL 50 MG PO TABS
50.0000 mg | ORAL_TABLET | Freq: Every evening | ORAL | Status: DC | PRN
  Administered 2018-01-15 – 2018-01-17 (×3): 50 mg via ORAL
  Filled 2018-01-15 (×3): qty 1

## 2018-01-15 MED ORDER — APIXABAN 5 MG PO TABS
5.0000 mg | ORAL_TABLET | Freq: Two times a day (BID) | ORAL | Status: DC
  Administered 2018-01-15 – 2018-01-18 (×6): 5 mg via ORAL
  Filled 2018-01-15 (×6): qty 1

## 2018-01-15 MED ORDER — SODIUM CHLORIDE 0.9 % IV SOLN
1.0000 g | INTRAVENOUS | Status: DC
  Administered 2018-01-16 – 2018-01-17 (×2): 1 g via INTRAVENOUS
  Filled 2018-01-15 (×2): qty 1

## 2018-01-15 MED ORDER — LATANOPROST 0.005 % OP SOLN
1.0000 [drp] | Freq: Every day | OPHTHALMIC | Status: DC
  Administered 2018-01-15 – 2018-01-17 (×3): 1 [drp] via OPHTHALMIC
  Filled 2018-01-15: qty 2.5

## 2018-01-15 MED ORDER — INSULIN ASPART 100 UNIT/ML ~~LOC~~ SOLN
0.0000 [IU] | Freq: Three times a day (TID) | SUBCUTANEOUS | Status: DC
  Administered 2018-01-16 (×2): 1 [IU] via SUBCUTANEOUS
  Administered 2018-01-17 (×2): 2 [IU] via SUBCUTANEOUS
  Administered 2018-01-17 – 2018-01-18 (×3): 1 [IU] via SUBCUTANEOUS

## 2018-01-15 MED ORDER — SODIUM CHLORIDE 0.9 % IV BOLUS
1000.0000 mL | Freq: Once | INTRAVENOUS | Status: AC
  Administered 2018-01-15: 1000 mL via INTRAVENOUS

## 2018-01-15 MED ORDER — INSULIN ASPART 100 UNIT/ML ~~LOC~~ SOLN: 0.0000 [IU] | Freq: Every day | SUBCUTANEOUS | Status: DC

## 2018-01-15 MED ORDER — SODIUM CHLORIDE 0.9 % IV SOLN
INTRAVENOUS | Status: DC
  Administered 2018-01-15 – 2018-01-18 (×5): via INTRAVENOUS

## 2018-01-15 MED ORDER — OXYBUTYNIN CHLORIDE 5 MG PO TABS
5.0000 mg | ORAL_TABLET | Freq: Every day | ORAL | Status: DC
  Administered 2018-01-15 – 2018-01-18 (×4): 5 mg via ORAL
  Filled 2018-01-15 (×4): qty 1

## 2018-01-15 MED ORDER — SODIUM CHLORIDE 0.9 % IV SOLN
1.0000 g | Freq: Once | INTRAVENOUS | Status: AC
  Administered 2018-01-15: 1 g via INTRAVENOUS
  Filled 2018-01-15: qty 10

## 2018-01-15 MED ORDER — ACETAMINOPHEN 325 MG PO TABS
650.0000 mg | ORAL_TABLET | Freq: Four times a day (QID) | ORAL | Status: DC | PRN
  Administered 2018-01-15 – 2018-01-18 (×5): 650 mg via ORAL
  Filled 2018-01-15 (×5): qty 2

## 2018-01-15 MED ORDER — METOPROLOL TARTRATE 5 MG/5ML IV SOLN
2.5000 mg | INTRAVENOUS | Status: DC | PRN
Start: 2018-01-15 — End: 2018-01-18
  Administered 2018-01-15: 2.5 mg via INTRAVENOUS
  Filled 2018-01-15: qty 5

## 2018-01-15 MED ORDER — ATORVASTATIN CALCIUM 20 MG PO TABS: 20.0000 mg | ORAL_TABLET | Freq: Every day | ORAL | Status: DC

## 2018-01-15 MED ORDER — ONDANSETRON HCL 4 MG/2ML IJ SOLN
4.0000 mg | Freq: Four times a day (QID) | INTRAMUSCULAR | Status: DC | PRN
Start: 2018-01-15 — End: 2018-01-18

## 2018-01-15 MED ORDER — AMLODIPINE BESYLATE 5 MG PO TABS
5.0000 mg | ORAL_TABLET | Freq: Every day | ORAL | Status: DC
  Administered 2018-01-15 – 2018-01-17 (×3): 5 mg via ORAL
  Filled 2018-01-15 (×3): qty 1

## 2018-01-15 NOTE — ED Triage Notes (Signed)
Transported by [facility] from home--lives alone; family has reported a decline in [place] over the last 2 months. Patient presents today with AMS and called EMS because of an unwitnessed "fall." Pt AAO x2 which is not baseline per EMS. Reporting pain to right side r/t fall.

## 2018-01-15 NOTE — ED Notes (Signed)
ED Provider at bedside. 

## 2018-01-15 NOTE — ED Provider Notes (Signed)
[facility] DEPT Provider Note   CSN: [id number] Arrival date & time: 01/15/18  1328     History   Chief Complaint Chief Complaint  Patient presents with  . Fall  . Altered Mental Status    HPI [name] is a 84 y.o. female presenting for evaluation after a fall.  Level 5 caveat, patient with altered mental status (?dementia).  Per patient, she states she fell when she was in the bathroom today.  She does not know what caused her to fall, but denies dizziness, chest pain, shortness of breath prior to the fall.  She states she landed on the right side.  She had the back of her head, but denies loss of consciousness.  She is on blood thinners. She is not sure how long she was laying on the ground, states that the fall happened sometime today.   Per chart review, there is been concern by the primary care doctor about whether patient is safe to be living at home by herself.  She has been found by EMS wedged between the toilet in the bathtub, thought she was laying in bed.  HPI  Past Medical History:  Diagnosis Date  . 1st degree AV block   . Acute renal failure ([place])    due to dehydration-Sept 2009; Normal creat 1.2 on fu 05/13/2009  . Allergic rhinitis   . Anemia    NOS chronic disease. Hgb 11.6gm% 04/14/2009  . Bradycardia   . CAD (coronary artery disease)    a. s/p DES mLAD 2005. b. Cutting balloon angioplasty for ISR 2006. c. LHC 08/2010 nonobstructive. d. Myoview 06/2012: small anteroapical infarct/mod inferolat wall infarct but no ischemia, EF 75%.  . Cancer ([place])    on nose and had it removed  . Chronic anticoagulation   . CKD (chronic kidney disease), stage III ([place])   . Dementia    slight  . Diabetes mellitus type II   . Diverticulitis of colon   . DM neuropathy, type II diabetes mellitus ([place])   . GERD (gastroesophageal reflux disease)   . HTN (hypertension)   . Hx of colonoscopy   . Hyperlipidemia   . IBS (irritable bowel  syndrome)   . Multinodular goiter   . Obesity    BMI-37  . Orthostatic hypotension   . Osteoarthritis    lower back  . Osteoporosis   . Pneumonia   . PVC's (premature ventricular contractions)    a. seen on telemetry 02/2016.  Marland Kitchen RBBB   . Rheumatoid arthritis ([place])   . Spinal stenosis    djd lumbar    Patient Active Problem List   Diagnosis Date Noted  . Acute metabolic encephalopathy 81/07/7508  . Syncope 02/17/2017  . PVC's (premature ventricular contractions) 03/07/2016  . Depression 03/06/2016  . Chest pain at rest 03/06/2016  . Unstable angina ([place]) 10/24/2015  . Chronic anticoagulation for A fib, CHA2DS2VASc = 6 12/18/2014  . Chest pain, negative MI, negative Nuc, may be GI 12/17/2014  . Diabetes mellitus type II, non insulin dependent ([place])   . HTN (hypertension)   . Tachycardia 08/07/2011  . Atrial fibrillation, permanent 06/25/2011  . Near Syncope 06/06/2011  . Coronary artery disease 04/05/2011  . Bradycardia 12/09/2009  . ACUTE BRONCHITIS 06/17/2009  . COPD, moderate ([place]) 06/17/2009  . PULMONARY NODULE, RIGHT UPPER LOBE 06/07/2009  . SHORTNESS OF BREATH (SOB) 06/07/2009  . POSTURAL HYPOTENSION 05/23/2009  . DEHYDRATION 05/13/2009  . ABDOMINAL PAIN, LOWER 01/21/2009  .  HOARSENESS, CHRONIC 12/03/2007  . GOITER 09/30/2007  . DIABETIC PERIPHERAL NEUROPATHY 09/30/2007  . OBESITY 09/30/2007  . HIATAL HERNIA 09/30/2007  . OSTEOARTHRITIS 09/30/2007  . SPINAL STENOSIS 09/30/2007  . PERIPHERAL EDEMA 09/30/2007  . HLD (hyperlipidemia) 02/12/2007  . ANEMIA-NOS 02/12/2007  . Neuropathy ([place]) 02/12/2007  . Allergic rhinitis, cause unspecified 02/12/2007  . GERD 02/12/2007  . DIVERTICULOSIS, COLON 02/12/2007    Past Surgical History:  Procedure Laterality Date  . ABDOMINAL HYSTERECTOMY    . bilateral arthroscopic knee surgery    . carpel tunnel wrist rt    . CHOLECYSTECTOMY    . CORONARY ANGIOPLASTY     stent  . NASAL SINUS SURGERY     x2  . stent surgery        OB History   None      Home Medications    Prior to Admission medications   Medication Sig Start Date End Date Taking? Authorizing Provider  amLODipine (NORVASC) 5 MG tablet Take 5 mg by mouth at bedtime.  03/20/16  Yes [provider]  apixaban (ELIQUIS) 5 MG TABS tablet Take 1 tablet (5 mg total) by mouth 2 (two) times daily. 01/31/15  Yes Weaver, [place] T, PA-C  atorvastatin (LIPITOR) 20 MG tablet Take 20 mg by mouth daily at 6 PM.    Yes [provider]  Calcium Carbonate-Vitamin D (CALCIUM 500/D PO) Take 1 tablet by mouth 2 (two) times daily.    Yes [provider]  diclofenac sodium (VOLTAREN) 1 % GEL Apply 2 g topically 4 (four) times daily. 10/22/17  Yes Vann, Jessica U, DO  gabapentin (NEURONTIN) 600 MG tablet Take 600 mg by mouth daily. 01/06/18  Yes [provider]  glipiZIDE (GLUCOTROL XL) 5 MG 24 hr tablet Take 5 mg by mouth daily. 12/05/17  Yes [provider]  latanoprost (XALATAN) 0.005 % ophthalmic solution INSTILL 1 DROP INTO BOTH EYES AT BEDTINE FOR GLAUCOMA 11/25/17  Yes [provider]  linaclotide (LINZESS) 145 MCG CAPS capsule Take 145 mg by mouth daily. 10/22/16  Yes [provider]  linagliptin (TRADJENTA) 5 MG TABS tablet Take 1 tablet (5 mg total) by mouth daily. 10/22/17  Yes Vann, Jessica U, DO  LUMIGAN 0.01 % SOLN Place 1 drop into both eyes at bedtime. 02/11/17  Yes [provider]  Melatonin 10 MG TABS Take 10 mg by mouth at bedtime.   Yes [provider]  Multiple Vitamins-Minerals (MULTIVITAMIN PO) Take 1 tablet by mouth daily.   Yes [provider]  Multiple Vitamins-Minerals (PRESERVISION AREDS PO) Take 1 capsule by mouth 2 (two) times daily.   Yes [provider]  oxybutynin (DITROPAN) 5 MG tablet Take 5 mg by mouth daily.   Yes [provider]  polyethylene glycol (MIRALAX / GLYCOLAX) packet Take 17 g by mouth daily as needed for mild constipation.  10/22/17  Yes Vann, Jessica U, DO  traMADol (ULTRAM) 50 MG tablet Take 2 tablets (100 mg total) by mouth every 8 (eight) hours as needed for severe pain. 10/22/17  Yes Vann, Jessica U, DO  traZODone (DESYREL) 50 MG tablet Take 50 mg by mouth at bedtime as needed for sleep. 01/06/18  Yes [provider]  cephALEXin (KEFLEX) 500 MG capsule Take 1 capsule (500 mg total) by mouth every 12 (twelve) hours. Patient not taking: Reported on 01/15/2018 10/22/17   Geradine Girt, DO  nitroGLYCERIN (NITROSTAT) 0.4 MG SL tablet Place 1 tablet (0.4 mg total) under the tongue every  5 (five) minutes as needed for chest pain (Max 3 doses). 10/22/17   Geradine Girt, DO    Family History Family History  Problem Relation Age of Onset  . Heart attack Father   . Breast cancer Sister   . Heart disease Brother   . Heart attack Son   . Heart attack Daughter   . Colon cancer Neg Hx   . Stroke Neg Hx     Social History Social History   Tobacco Use  . Smoking status: Never Smoker  . Smokeless tobacco: Never Used  Substance Use Topics  . Alcohol use: No  . Drug use: No     Allergies   Amoxicillin   Review of Systems Review of Systems  Unable to perform ROS: Mental status change  Musculoskeletal: Positive for back pain.  Neurological: Negative for dizziness, weakness and headaches.  Hematological: Bruises/bleeds easily.  All other systems reviewed and are negative.    Physical Exam Updated Vital Signs BP (!) 150/78   Pulse (!) 118   Temp 98.9 F (37.2 C) (Oral)   Resp 16   SpO2 91%   Physical Exam  Constitutional: She is oriented to person, place, and time. She appears well-developed and well-nourished. No distress.  Elderly female in no apparent distress  HENT:  Head: Normocephalic.  Eyes: Pupils are equal, round, and reactive to light. Conjunctivae and EOM are normal.  Neck: Normal range of motion. Neck supple.  Cardiovascular: Intact distal pulses.  irreg irreg, in a fib    Pulmonary/Chest: Effort normal and breath sounds normal. No respiratory distress. She has no wheezes.  Abdominal: Soft. Bowel sounds are normal. She exhibits no distension and no mass. There is no tenderness. There is no guarding.  Musculoskeletal: Normal range of motion. She exhibits tenderness. She exhibits no edema or deformity.  TTP of bilateral thoracic back musculature.  No obvious step-offs or deformities.  Strength of upper extremities intact bilaterally.  Pt reports soreness with palpation of hips bilaterally. Radial pedal pulses intact bilaterally.  No pain with straight leg raise bilaterally.   Neurological: She is alert and oriented to person, place, and time. No sensory deficit.  Skin: Skin is warm and dry. Capillary refill takes less than 2 seconds.  Psychiatric: She has a normal mood and affect.  Nursing note and vitals reviewed.    ED Treatments / Results  Labs (all labs ordered are listed, but only abnormal results are displayed) Labs Reviewed  CBC - Abnormal; Notable for the following components:      Result Value   WBC 11.3 (*)    All other components within normal limits  BASIC METABOLIC PANEL - Abnormal; Notable for the following components:   Glucose, Bld 163 (*)    All other components within normal limits  URINALYSIS, ROUTINE W REFLEX MICROSCOPIC - Abnormal; Notable for the following components:   Color, Urine AMBER (*)    APPearance HAZY (*)    Hgb urine dipstick SMALL (*)    Ketones, ur 20 (*)    Protein, ur 30 (*)    Nitrite POSITIVE (*)    Leukocytes, UA MODERATE (*)    Bacteria, UA FEW (*)    All other components within normal limits  CK - Abnormal; Notable for the following components:   Total CK 3,241 (*)    All other components within normal limits  URINE CULTURE    EKG None  Radiology Dg Chest 2 View  Result Date: 01/15/2018 CLINICAL DATA:  Chest pain.  Unwitnessed fall. EXAM: CHEST - 2 VIEW COMPARISON:  Radiographs of September 26, 2017.  FINDINGS: Stable cardiomediastinal silhouette. No pneumothorax or pleural effusion is noted. Both lungs are clear. The visualized skeletal structures are unremarkable. IMPRESSION: No active cardiopulmonary disease. Electronically Signed   By: Marijo Conception, M.D.   On: 01/15/2018 15:38   Ct Head Wo Contrast  Result Date: 01/15/2018 CLINICAL DATA:  Trauma. EXAM: CT HEAD WITHOUT CONTRAST CT CERVICAL SPINE WITHOUT CONTRAST TECHNIQUE: Multidetector CT imaging of the head and cervical spine was performed following the standard protocol without intravenous contrast. Multiplanar CT image reconstructions of the cervical spine were also generated. COMPARISON:  CT scan of October 20, 2017. FINDINGS: CT HEAD FINDINGS Brain: Mild diffuse cortical atrophy is noted. Moderate chronic ischemic white matter disease is noted. No mass effect or midline shift is noted. Ventricular size is within normal limits. There is no evidence of mass lesion, hemorrhage or acute infarction. Vascular: No hyperdense vessel or unexpected calcification. Skull: Normal. Negative for fracture or focal lesion. Sinuses/Orbits: No acute finding. Other: None. CT CERVICAL SPINE FINDINGS Alignment: Normal. Skull base and vertebrae: No acute fracture. No primary bone lesion or focal pathologic process. Soft tissues and spinal canal: No prevertebral fluid or swelling. No visible canal hematoma. Disc levels: Anterior osteophyte formation is noted anteriorly at C5-6, C6-7 and T1-2. Upper chest: Negative. Other: Degenerative changes are seen involving posterior facet joints bilaterally. IMPRESSION: Mild diffuse cortical atrophy. Moderate chronic ischemic white matter disease. No acute intracranial abnormality seen. Degenerative changes as described above. No acute abnormality seen in the cervical spine. Electronically Signed   By: Marijo Conception, M.D.   On: 01/15/2018 15:27   Ct Cervical Spine Wo Contrast  Result Date: 01/15/2018 CLINICAL DATA:  Trauma.  EXAM: CT HEAD WITHOUT CONTRAST CT CERVICAL SPINE WITHOUT CONTRAST TECHNIQUE: Multidetector CT imaging of the head and cervical spine was performed following the standard protocol without intravenous contrast. Multiplanar CT image reconstructions of the cervical spine were also generated. COMPARISON:  CT scan of October 20, 2017. FINDINGS: CT HEAD FINDINGS Brain: Mild diffuse cortical atrophy is noted. Moderate chronic ischemic white matter disease is noted. No mass effect or midline shift is noted. Ventricular size is within normal limits. There is no evidence of mass lesion, hemorrhage or acute infarction. Vascular: No hyperdense vessel or unexpected calcification. Skull: Normal. Negative for fracture or focal lesion. Sinuses/Orbits: No acute finding. Other: None. CT CERVICAL SPINE FINDINGS Alignment: Normal. Skull base and vertebrae: No acute fracture. No primary bone lesion or focal pathologic process. Soft tissues and spinal canal: No prevertebral fluid or swelling. No visible canal hematoma. Disc levels: Anterior osteophyte formation is noted anteriorly at C5-6, C6-7 and T1-2. Upper chest: Negative. Other: Degenerative changes are seen involving posterior facet joints bilaterally. IMPRESSION: Mild diffuse cortical atrophy. Moderate chronic ischemic white matter disease. No acute intracranial abnormality seen. Degenerative changes as described above. No acute abnormality seen in the cervical spine. Electronically Signed   By: Marijo Conception, M.D.   On: 01/15/2018 15:27   Dg Hips Bilat W Or Wo Pelvis 3-4 Views  Result Date: 01/15/2018 CLINICAL DATA:  Bilateral hip pain after unwitnessed fall. EXAM: DG HIP (WITH OR WITHOUT PELVIS) 3-4V BILAT COMPARISON:  None. FINDINGS: There is no evidence of hip fracture or dislocation. There is no evidence of arthropathy or other focal bone abnormality. IMPRESSION: Normal bilateral hips. Electronically Signed   By: Marijo Conception, M.D.   On: 01/15/2018  15:39     Procedures Procedures (including critical care time)  Medications Ordered in ED Medications  sodium chloride 0.9 % bolus 1,000 mL (1,000 mLs Intravenous New Bag/Given 01/15/18 1555)  cefTRIAXone (ROCEPHIN) 1 g in sodium chloride 0.9 % 100 mL IVPB (0 g Intravenous Stopped 01/15/18 1555)     Initial Impression / Assessment and Plan / ED Course  I have reviewed the triage vital signs and the nursing notes.  Pertinent labs & imaging results that were available during my care of the patient were reviewed by me and considered in my medical decision making (see chart for details).     Patient presenting for evaluation after a fall.  Physical exam shows elderly female who appears in no distress.  Unknown amount of time laying on the floor.  Patient has an odor consistent with a UTI.  Will obtain imaging including CT head, neck, chest x-ray, and hip x-rays.  Will obtain labs including CK.  Will obtain urine and cx.  Labs show UTI with positive nitrates and leuks.  Per previous urine cultures, susceptible to cephalosporins.  Will give dose of Rocephin.  Mild leukocytosis at 11.3.  Creatinine stable.  CK elevated greater than 3200.  Will give IV fluid bolus. xrays viewed and interpreted by me, no fx, dislocations, PNA, pnx, or effusions. CT head and neck without acute findings including fx or bleed.  Discussed with patient that she will need to stay in the hospital.  Social work consult placed, as I am concerned about patient safety returning home by herself. Case discussed with attending, Dr. Ashok Cordia evaluated the pt. Will consult with hospitalist for admission.   Discussed with hospitalist, patient to be admitted under [facility] service.  Final Clinical Impressions(s) / ED Diagnoses   Final diagnoses:  Urinary tract infection without hematuria, site unspecified  Non-traumatic rhabdomyolysis    ED Discharge Orders    None       Franchot Heidelberg, PA-C 01/15/18 1617    Lajean Saver,  MD 01/17/18 1323

## 2018-01-15 NOTE — H&P (Addendum)
History and Physical  [name] CHY:[id number] DOB: 1923/11/03 DOA: 01/15/2018  Referring physician: DR Junious Silk  PCP: Chesley Noon, MD  Outpatient Specialists: None Patient coming from: Home  Chief Complaint: Altered mental status  HPI: [name] is a 84 y.o. female with medical history significant for chronic A. fib on Eliquis, type 2 diabetes, diastolic CHF, recurrent UTI, overactive bladder, who presented to ED [facility] due to altered mental status and a fall at home.  The history is obtained from the patient, her granddaughter, ED physician, and medical records.  Per the patient's granddaughter who is also her power of attorney she had been confused intermittently for the past 2 months.  This morning the patient was not answering her phone.  Her granddaughter became concerned and presented to her house where she was found on the floor.  Unclear how long she had been laying there.  At the time of this visit, the patient is alert and oriented x3.  She admits to falling and feeling dizzy prior to the fall.  Denies any chest pain dyspnea or palpitations.  Admits to hitting the back of her head and her right shoulder.  ED Course: Upon presentation to the ED vital signs remarkable for hypertension and A. fib RVR at a rate of 118.  Urine analysis positive for pyuria.  Lab studies remarkable for elevated CPK over 3200.  Promptly started on IV ceftriaxone and IV fluid.  CT head no contrast unremarkable for any acute intracranial abnormalities. Bilateral hip x-ray unremarkable for any fractures.  Unremarkable chest x-ray.  Admitted for acute metabolic encephalopathy most likely secondary to UTI.  Review of Systems: Review of systems as noted in the HPI.  All other systems reviewed and are negative.   Past Medical History:  Diagnosis Date  . 1st degree AV block   . Acute renal failure ([place])    due to dehydration-Sept 2009; Normal creat 1.2 on fu 05/13/2009  . Allergic rhinitis   .  Anemia    NOS chronic disease. Hgb 11.6gm% 04/14/2009  . Bradycardia   . CAD (coronary artery disease)    a. s/p DES mLAD 2005. b. Cutting balloon angioplasty for ISR 2006. c. LHC 08/2010 nonobstructive. d. Myoview 06/2012: small anteroapical infarct/mod inferolat wall infarct but no ischemia, EF 75%.  . Cancer ([place])    on nose and had it removed  . Chronic anticoagulation   . CKD (chronic kidney disease), stage III ([place])   . Dementia    slight  . Diabetes mellitus type II   . Diverticulitis of colon   . DM neuropathy, type II diabetes mellitus ([place])   . GERD (gastroesophageal reflux disease)   . HTN (hypertension)   . Hx of colonoscopy   . Hyperlipidemia   . IBS (irritable bowel syndrome)   . Multinodular goiter   . Obesity    BMI-37  . Orthostatic hypotension   . Osteoarthritis    lower back  . Osteoporosis   . Pneumonia   . PVC's (premature ventricular contractions)    a. seen on telemetry 02/2016.  Marland Kitchen RBBB   . Rheumatoid arthritis ([place])   . Spinal stenosis    djd lumbar   Past Surgical History:  Procedure Laterality Date  . ABDOMINAL HYSTERECTOMY    . bilateral arthroscopic knee surgery    . carpel tunnel wrist rt    . CHOLECYSTECTOMY    . CORONARY ANGIOPLASTY     stent  . NASAL SINUS SURGERY  x2  . stent surgery      Social History:  reports that she has never smoked. She has never used smokeless tobacco. She reports that she does not drink alcohol or use drugs.   Allergies  Allergen Reactions  . Amoxicillin Other (See Comments)    UNKNOWN    Family History  Problem Relation Age of Onset  . Heart attack Father   . Breast cancer Sister   . Heart disease Brother   . Heart attack Son   . Heart attack Daughter   . Colon cancer Neg Hx   . Stroke Neg Hx      Prior to Admission medications   Medication Sig Start Date End Date Taking? Authorizing Provider  amLODipine (NORVASC) 5 MG tablet Take 5 mg by mouth at bedtime.  03/20/16  Yes [provider]  apixaban (ELIQUIS) 5 MG TABS tablet Take 1 tablet (5 mg total) by mouth 2 (two) times daily. 01/31/15  Yes Weaver, Scott T, PA-C  atorvastatin (LIPITOR) 20 MG tablet Take 20 mg by mouth daily at 6 PM.    Yes [provider]  Calcium Carbonate-Vitamin D (CALCIUM 500/D PO) Take 1 tablet by mouth 2 (two) times daily.    Yes [provider]  diclofenac sodium (VOLTAREN) 1 % GEL Apply 2 g topically 4 (four) times daily. 10/22/17  Yes Vann, Jessica U, DO  gabapentin (NEURONTIN) 600 MG tablet Take 600 mg by mouth daily. 01/06/18  Yes [provider]  glipiZIDE (GLUCOTROL XL) 5 MG 24 hr tablet Take 5 mg by mouth daily. 12/05/17  Yes [provider]  latanoprost (XALATAN) 0.005 % ophthalmic solution INSTILL 1 DROP INTO BOTH EYES AT BEDTINE FOR GLAUCOMA 11/25/17  Yes [provider]  linaclotide (LINZESS) 145 MCG CAPS capsule Take 145 mg by mouth daily. 10/22/16  Yes [provider]  linagliptin (TRADJENTA) 5 MG TABS tablet Take 1 tablet (5 mg total) by mouth daily. 10/22/17  Yes Vann, Jessica U, DO  LUMIGAN 0.01 % SOLN Place 1 drop into both eyes at bedtime. 02/11/17  Yes [provider]  Melatonin 10 MG TABS Take 10 mg by mouth at bedtime.   Yes [provider]  Multiple Vitamins-Minerals (MULTIVITAMIN PO) Take 1 tablet by mouth daily.   Yes [provider]  Multiple Vitamins-Minerals (PRESERVISION AREDS PO) Take 1 capsule by mouth 2 (two) times daily.   Yes [provider]  oxybutynin (DITROPAN) 5 MG tablet Take 5 mg by mouth daily.   Yes [provider]  polyethylene glycol (MIRALAX / GLYCOLAX) packet Take 17 g by mouth daily as needed for mild constipation. 10/22/17  Yes Vann, Jessica U, DO  traMADol (ULTRAM) 50 MG tablet Take 2 tablets (100 mg total) by mouth every 8 (eight) hours as needed for severe pain. 10/22/17  Yes Vann, Jessica U, DO  traZODone (DESYREL) 50 MG tablet Take 50 mg by mouth at bedtime  as needed for sleep. 01/06/18  Yes [provider]  cephALEXin (KEFLEX) 500 MG capsule Take 1 capsule (500 mg total) by mouth every 12 (twelve) hours. Patient not taking: Reported on 01/15/2018 10/22/17   Geradine Girt, DO  nitroGLYCERIN (NITROSTAT) 0.4 MG SL tablet Place 1 tablet (0.4 mg total) under the tongue every 5 (five) minutes as needed for chest pain (Max 3 doses). 10/22/17   Geradine Girt, DO    Physical Exam: BP (!) 150/78   Pulse (!) 118   Temp 98.9 F (37.2  C) (Oral)   Resp 16   SpO2 91%   . General: 84 y.o. year-old female well developed well nourished in no acute distress.  Alert and oriented x3. . Cardiovascular: Irregular rate and rhythm with no rubs or gallops.  No thyromegaly or JVD noted.   Marland Kitchen Respiratory: Clear to auscultation with no wheezes or rales. Good inspiratory effort. . Abdomen: Soft nontender nondistended with normal bowel sounds x4 quadrants. . Musculoskeletal: No lower extremity edema. 2/4 pulses in all 4 extremities.  4 out of 5 strength in RLE. . Skin: No ulcerative lesions noted or rashes.   . Psychiatry: Mood is appropriate for condition and setting          Labs on Admission:  Basic Metabolic Panel: Recent Labs  Lab 01/15/18 1407  NA 138  K 4.0  CL 99  CO2 29  GLUCOSE 163*  BUN 14  CREATININE 0.77  CALCIUM 8.9   Liver Function Tests: No results for input(s): AST, ALT, ALKPHOS, BILITOT, PROT, ALBUMIN in the last 168 hours. No results for input(s): LIPASE, AMYLASE in the last 168 hours. No results for input(s): AMMONIA in the last 168 hours. CBC: Recent Labs  Lab 01/15/18 1407  WBC 11.3*  HGB 13.4  HCT 39.7  MCV 91.9  PLT 321   Cardiac Enzymes: Recent Labs  Lab 01/15/18 1407  CKTOTAL 3,241*    BNP (last 3 results) No results for input(s): BNP in the last 8760 hours.  ProBNP (last 3 results) No results for input(s): PROBNP in the last 8760 hours.  CBG: No results for input(s): GLUCAP in the last 168  hours.  Radiological Exams on Admission: Dg Chest 2 View  Result Date: 01/15/2018 CLINICAL DATA:  Chest pain.  Unwitnessed fall. EXAM: CHEST - 2 VIEW COMPARISON:  Radiographs of September 26, 2017. FINDINGS: Stable cardiomediastinal silhouette. No pneumothorax or pleural effusion is noted. Both lungs are clear. The visualized skeletal structures are unremarkable. IMPRESSION: No active cardiopulmonary disease. Electronically Signed   By: Marijo Conception, M.D.   On: 01/15/2018 15:38   Ct Head Wo Contrast  Result Date: 01/15/2018 CLINICAL DATA:  Trauma. EXAM: CT HEAD WITHOUT CONTRAST CT CERVICAL SPINE WITHOUT CONTRAST TECHNIQUE: Multidetector CT imaging of the head and cervical spine was performed following the standard protocol without intravenous contrast. Multiplanar CT image reconstructions of the cervical spine were also generated. COMPARISON:  CT scan of October 20, 2017. FINDINGS: CT HEAD FINDINGS Brain: Mild diffuse cortical atrophy is noted. Moderate chronic ischemic white matter disease is noted. No mass effect or midline shift is noted. Ventricular size is within normal limits. There is no evidence of mass lesion, hemorrhage or acute infarction. Vascular: No hyperdense vessel or unexpected calcification. Skull: Normal. Negative for fracture or focal lesion. Sinuses/Orbits: No acute finding. Other: None. CT CERVICAL SPINE FINDINGS Alignment: Normal. Skull base and vertebrae: No acute fracture. No primary bone lesion or focal pathologic process. Soft tissues and spinal canal: No prevertebral fluid or swelling. No visible canal hematoma. Disc levels: Anterior osteophyte formation is noted anteriorly at C5-6, C6-7 and T1-2. Upper chest: Negative. Other: Degenerative changes are seen involving posterior facet joints bilaterally. IMPRESSION: Mild diffuse cortical atrophy. Moderate chronic ischemic white matter disease. No acute intracranial abnormality seen. Degenerative changes as described above. No acute  abnormality seen in the cervical spine. Electronically Signed   By: Marijo Conception, M.D.   On: 01/15/2018 15:27   Ct Cervical Spine Wo Contrast  Result Date: 01/15/2018 CLINICAL DATA:  Trauma. EXAM: CT HEAD WITHOUT CONTRAST CT CERVICAL SPINE WITHOUT CONTRAST TECHNIQUE: Multidetector CT imaging of the head and cervical spine was performed following the standard protocol without intravenous contrast. Multiplanar CT image reconstructions of the cervical spine were also generated. COMPARISON:  CT scan of October 20, 2017. FINDINGS: CT HEAD FINDINGS Brain: Mild diffuse cortical atrophy is noted. Moderate chronic ischemic white matter disease is noted. No mass effect or midline shift is noted. Ventricular size is within normal limits. There is no evidence of mass lesion, hemorrhage or acute infarction. Vascular: No hyperdense vessel or unexpected calcification. Skull: Normal. Negative for fracture or focal lesion. Sinuses/Orbits: No acute finding. Other: None. CT CERVICAL SPINE FINDINGS Alignment: Normal. Skull base and vertebrae: No acute fracture. No primary bone lesion or focal pathologic process. Soft tissues and spinal canal: No prevertebral fluid or swelling. No visible canal hematoma. Disc levels: Anterior osteophyte formation is noted anteriorly at C5-6, C6-7 and T1-2. Upper chest: Negative. Other: Degenerative changes are seen involving posterior facet joints bilaterally. IMPRESSION: Mild diffuse cortical atrophy. Moderate chronic ischemic white matter disease. No acute intracranial abnormality seen. Degenerative changes as described above. No acute abnormality seen in the cervical spine. Electronically Signed   By: Marijo Conception, M.D.   On: 01/15/2018 15:27   Dg Hips Bilat W Or Wo Pelvis 3-4 Views  Result Date: 01/15/2018 CLINICAL DATA:  Bilateral hip pain after unwitnessed fall. EXAM: DG HIP (WITH OR WITHOUT PELVIS) 3-4V BILAT COMPARISON:  None. FINDINGS: There is no evidence of hip fracture or  dislocation. There is no evidence of arthropathy or other focal bone abnormality. IMPRESSION: Normal bilateral hips. Electronically Signed   By: Marijo Conception, M.D.   On: 01/15/2018 15:39    EKG: Independently reviewed.  Personally reviewed twelve-lead EKG which revealed A. fib at a rate of 100 with prolonged QTC of 500.  Assessment/Plan Present on Admission: . Acute metabolic encephalopathy  Active Problems:   Acute metabolic encephalopathy   Acute metabolic encephalopathy most likely secondary to UTI CT head unremarkable for any acute intracranial findings Neuro checks q4H Reorient as needed Obtain urine culture Continue IV ceftriaxone Monitor fever curve Monitor urine output Monitor WBC Obtain CBC in the morning Patient lives alone Case worker consulted for placement to SNF or assisted living  Rhabdomyolysis suspect secondary to fall and laying on the floor CPK on  presentation 3200 Continue IV fluid hydration Monitor urine output Monitor renal function Repeat CPK in the morning  Ambulatory dysfunction/fall Obtain orthostatic vital signs Monitor on telemetry Fall precautions PT to assess  UTI, present on admission Management as stated above  Chronic A. fib Not on rate control medications On Eliquis for CVA prophylaxis Start beta-blocker if goes into RVR  History of medical non compliance Per her granddaughter Anderson Malta patient had not been taking her medication consistently Needs reinforcement Needs placement to SNF or assisted living  Type 2 diabetes completed by hyperglycemia Obtain A1c Start insulin sliding scale Hold glipizide and Tradjenta Start heart heart healthy diabetic diet  Hyperlipidemia Continue Lipitor  Chronic diastolic CHF Last 2D echo done on 02/18/2017 revealed LVEF 60 to 65% Strict I's and O Daily weight  Hypertension Continue Norvasc    Risks: Severe risk for decompensation due to acute metabolic encephalopathy in the  setting of UTI, rhabdomyolysis, chronic A. fib on Eliquis, ambulatory dysfunction with fall, multiple comorbidities, and advanced age.  DVT prophylaxis: Eliquis  Code Status: Full code  Family Communication: Spoke with granddaughter Anderson Malta who is  also power of attorney on the phone at [phone]  Disposition Plan: SNF when clinically stable  Consults called: None  Admission status: Inpatient  Patient will require at least 2 midnights for further testing and treatment of her present condition.    Kayleen Memos MD [facility] Pager [phone]  If 7PM-7AM, please contact night-coverage [email or web address] Password [facility]  01/15/2018, 5:05 PM

## 2018-01-15 NOTE — ED Notes (Signed)
Bed: GS81 Expected date:  Expected time:  Means of arrival:  Comments: EMS/Fall/A.M.S.

## 2018-01-16 DIAGNOSIS — M6282 Rhabdomyolysis: Secondary | ICD-10-CM | POA: Diagnosis not present

## 2018-01-16 DIAGNOSIS — G9341 Metabolic encephalopathy: Secondary | ICD-10-CM | POA: Diagnosis not present

## 2018-01-16 DIAGNOSIS — N390 Urinary tract infection, site not specified: Secondary | ICD-10-CM | POA: Diagnosis not present

## 2018-01-16 LAB — GLUCOSE, CAPILLARY
GLUCOSE-CAPILLARY: 119 mg/dL — AB (ref 70–99)
Glucose-Capillary: 135 mg/dL — ABNORMAL HIGH (ref 70–99)
Glucose-Capillary: 147 mg/dL — ABNORMAL HIGH (ref 70–99)
Glucose-Capillary: 119 mg/dL — ABNORMAL HIGH (ref 70–99)

## 2018-01-16 LAB — CBC
HEMATOCRIT: 34.6 % — AB (ref 36.0–46.0)
Hemoglobin: 11.3 g/dL — ABNORMAL LOW (ref 12.0–15.0)
MCH: 30.5 pg (ref 26.0–34.0)
MCHC: 32.7 g/dL (ref 30.0–36.0)
MCV: 93.3 fL (ref 78.0–100.0)
PLATELETS: 240 10*3/uL (ref 150–400)
RBC: 3.71 MIL/uL — ABNORMAL LOW (ref 3.87–5.11)
RDW: 14.9 % (ref 11.5–15.5)
WBC: 7.1 10*3/uL (ref 4.0–10.5)

## 2018-01-16 LAB — COMPREHENSIVE METABOLIC PANEL
ALT: 32 U/L (ref 0–44)
AST: 53 U/L — ABNORMAL HIGH (ref 15–41)
Albumin: 2.8 g/dL — ABNORMAL LOW (ref 3.5–5.0)
Alkaline Phosphatase: 53 U/L (ref 38–126)
Anion gap: 10 (ref 5–15)
BILIRUBIN TOTAL: 0.8 mg/dL (ref 0.3–1.2)
BUN: 12 mg/dL (ref 8–23)
CALCIUM: 8.2 mg/dL — AB (ref 8.9–10.3)
CO2: 27 mmol/L (ref 22–32)
CREATININE: 0.75 mg/dL (ref 0.44–1.00)
Chloride: 103 mmol/L (ref 98–111)
GFR calc Af Amer: >60 mL/min (ref >60)
GLUCOSE: 135 mg/dL — AB (ref 70–99)
Potassium: 3.8 mmol/L (ref 3.5–5.1)
Sodium: 140 mmol/L (ref 135–145)
Total Protein: 5.5 g/dL — ABNORMAL LOW (ref 6.5–8.1)

## 2018-01-16 LAB — CK: Total CK: 1155 U/L — ABNORMAL HIGH (ref 38–234)

## 2018-01-16 NOTE — Progress Notes (Signed)
PROGRESS NOTE  [name] ERX:[id number] DOB: September 11, 1923 DOA: 01/15/2018 PCP: Chesley Noon, MD  HPI/Recap of past 24 hours: [name] is a 84 y.o. female with medical history significant for chronic A. fib on Eliquis, type 2 diabetes, diastolic CHF, recurrent UTI, overactive bladder, who presented to ED [facility] due to altered mental status and a fall at home.  The history is obtained from the patient, her granddaughter, ED physician, and medical records.  Per the patient's granddaughter who is also her power of attorney she had been confused intermittently for the past 2 months.  This morning the patient was not answering her phone.  Her granddaughter became concerned and presented to her house where she was found on the floor.  Unclear how long she had been laying there.  At the time of this visit, the patient is alert and oriented x3.  She admits to falling and feeling dizzy prior to the fall.  Denies any chest pain dyspnea or palpitations.  Admits to hitting the back of her head and her right shoulder.  01/16/2018: Patient seen and examined at bedside.  Reports moderate dull pain in her right shoulder.  Assessment/Plan: Active Problems:   Acute metabolic encephalopathy  Acute metabolic encephalopathy Most likely multifactorial secondary to UTI versus others Waxes and wanes, suspect baseline dementia Reorient as needed Fall precautions  UTI Urine culture greater than 100,000 colonies of gram-negative rods Awaiting sensitivities Continue IV ceftriaxone Monitor fever curve Monitor WBC Repeat CBC in the morning  Rhabdomyolysis  CPK is trending down Continue IV fluid hydration Continue to monitor urine output Continue to monitor renal function Repeat BMP in the morning  Chronic A. fib Rate controlled Continue Eliquis  Type 2 diabetes Hemoglobin A1c 6.2 Continue insulin sliding scale Avoid hypoglycemia  Hypertension Blood pressures well controlled Continue  Norvasc    Code Status: Full code  Family Communication: None at bedside  Disposition Plan: Home versus SNF when clinically stable   Consultants:  none  Procedures:  None  Antimicrobials:  Iv ceftriaxone  DVT prophylaxis:  eliquis    Objective: Vitals:   01/15/18 2200 01/16/18 0449 01/16/18 0454 01/16/18 1348  BP:  133/66  102/88  Pulse:  72  95  Resp:  18  16  Temp:  97.7 F (36.5 C)  97.8 F (36.6 C)  TempSrc:  Oral  Oral  SpO2: 98% 98%  99%  Weight:   75.2 kg (165 lb 12.6 oz)   Height:        Intake/Output Summary (Last 24 hours) at 01/16/2018 1701 Last data filed at 01/16/2018 1415 Gross per 24 hour  Intake 3308.33 ml  Output 250 ml  Net 3058.33 ml   Filed Weights   01/15/18 1816 01/16/18 0454  Weight: 74.2 kg (163 lb 8 oz) 75.2 kg (165 lb 12.6 oz)    Exam:  . General: 84 y.o. year-old female well developed well nourished in no acute distress.  Alert and oriented x3. . Cardiovascular: Irregular rate and rhythm with no rubs or gallops.  No thyromegaly or JVD noted.   Marland Kitchen Respiratory: Clear to auscultation with no wheezes or rales. Good inspiratory effort. . Abdomen: Soft nontender nondistended with normal bowel sounds x4 quadrants. . Musculoskeletal: No lower extremity edema. 2/4 pulses in all 4 extremities. . Skin: No ulcerative lesions noted or rashes . Psychiatry: Mood is appropriate for condition and setting   Data Reviewed: CBC: Recent Labs  Lab 01/15/18 1407 01/16/18 0424  WBC 11.3*  7.1  HGB 13.4 11.3*  HCT 39.7 34.6*  MCV 91.9 93.3  PLT 321 350   Basic Metabolic Panel: Recent Labs  Lab 01/15/18 1407 01/16/18 0424  NA 138 140  K 4.0 3.8  CL 99 103  CO2 29 27  GLUCOSE 163* 135*  BUN 14 12  CREATININE 0.77 0.75  CALCIUM 8.9 8.2*   GFR: Estimated Creatinine Clearance: 44.5 mL/min (by C-G formula based on SCr of 0.75 mg/dL). Liver Function Tests: Recent Labs  Lab 01/16/18 0424  AST 53*  ALT 32  ALKPHOS 53  BILITOT  0.8  PROT 5.5*  ALBUMIN 2.8*   No results for input(s): LIPASE, AMYLASE in the last 168 hours. No results for input(s): AMMONIA in the last 168 hours. Coagulation Profile: No results for input(s): INR, PROTIME in the last 168 hours. Cardiac Enzymes: Recent Labs  Lab 01/15/18 1407 01/16/18 0424  CKTOTAL 3,241* 1,155*   BNP (last 3 results) No results for input(s): PROBNP in the last 8760 hours. HbA1C: Recent Labs    01/15/18 1407  HGBA1C 6.2*   CBG: Recent Labs  Lab 01/15/18 2109 01/16/18 0737 01/16/18 1222 01/16/18 1657  GLUCAP 185* 119* 135* 147*   Lipid Profile: No results for input(s): CHOL, HDL, LDLCALC, TRIG, CHOLHDL, LDLDIRECT in the last 72 hours. Thyroid Function Tests: No results for input(s): TSH, T4TOTAL, FREET4, T3FREE, THYROIDAB in the last 72 hours. Anemia Panel: No results for input(s): VITAMINB12, FOLATE, FERRITIN, TIBC, IRON, RETICCTPCT in the last 72 hours. Urine analysis:    Component Value Date/Time   COLORURINE AMBER (A) 01/15/2018 1412   APPEARANCEUR HAZY (A) 01/15/2018 1412   LABSPEC 1.014 01/15/2018 1412   PHURINE 6.0 01/15/2018 1412   GLUCOSEU NEGATIVE 01/15/2018 1412   GLUCOSEU NEGATIVE 11/24/2010 1224   HGBUR SMALL (A) 01/15/2018 1412   BILIRUBINUR NEGATIVE 01/15/2018 1412   KETONESUR 20 (A) 01/15/2018 1412   PROTEINUR 30 (A) 01/15/2018 1412   UROBILINOGEN 0.2 05/05/2014 2123   NITRITE POSITIVE (A) 01/15/2018 1412   LEUKOCYTESUR MODERATE (A) 01/15/2018 1412   Sepsis Labs: @LABRCNTIP (procalcitonin:4,lacticidven:4)  ) Recent Results (from the past 240 hour(s))  Urine culture     Status: Abnormal (Preliminary result)   Collection Time: 01/15/18  2:12 PM  Result Value Ref Range Status   Specimen Description   Final    URINE, CLEAN CATCH Performed at [facility], [place] [address], [place], [place] [address]    Special Requests   Final    NONE Performed at [facility], [place] [address],  [place], [place] [address]    Culture >=100,000 COLONIES/mL GRAM NEGATIVE RODS (A)  Final   Report Status PENDING  Incomplete      Studies: No results found.  Scheduled Meds: . amLODipine  5 mg Oral QHS  . apixaban  5 mg Oral BID  . insulin aspart  0-5 Units Subcutaneous QHS  . insulin aspart  0-9 Units Subcutaneous TID WC  . latanoprost  1 drop Both Eyes QHS  . oxybutynin  5 mg Oral Daily    Continuous Infusions: . sodium chloride 100 mL/hr at 01/16/18 1522  . cefTRIAXone (ROCEPHIN)  IV Stopped (01/16/18 1042)     LOS: 1 day     Kayleen Memos, MD [facility] Pager [phone]  If 7PM-7AM, please contact night-coverage [email or web address] Password [email or web address] 01/16/2018, 5:01 PM

## 2018-01-16 NOTE — Care Management Note (Signed)
Case Management Note  Patient Details  Name: [name] MRN: [id number] Date of Birth: 1924/01/31  Subjective/Objective:  Spoke to grand dtr [name] on phone-informed of Condition code 44-voiced understanding. [name] states she is already looking int [facility]-CSW notified.                  Action/Plan:d/c ALF   Expected Discharge Date:  (unknown)               Expected Discharge Plan:  Assisted Living / Rest Home  In-House Referral:  Clinical Social Work  Discharge planning Services  CM Consult  Post Acute Care Choice:    Choice offered to:     DME Arranged:    DME Agency:     HH Arranged:    [place] Agency:     Status of Service:  In process, will continue to follow  If discussed at Long Length of Stay Meetings, dates discussed:    Additional Comments:  Dessa Phi, RN 01/16/2018, 4:28 PM

## 2018-01-16 NOTE — Evaluation (Signed)
Physical Therapy Evaluation Patient Details Name: [name] MRN: [id number] DOB: 05/23/1924 Today's Date: 01/16/2018   History of Present Illness  PT 84 yr old female admitted with altered mental status; acute metabolic encephalopathy. Granddaughter POA.  Clinical Impression  Pt admitted after being found on floor. Patient reports falling. Patient requires significant assistance for all mobility at this time. Complains of numbness in lateral Rt foot and lower leg making weight bearing activities difficult. Pt admitted with above diagnosis. Pt currently with functional limitations due to the deficits listed below (see PT Problem List). Pt will benefit from skilled PT to increase their independence and safety with mobility to allow discharge to the venue listed below.       Follow Up Recommendations SNF;Supervision for mobility/OOB    Equipment Recommendations  None recommended by PT;Other (comment)(to be further assessed at next venue of care.)    Recommendations for Other Services       Precautions / Restrictions Precautions Precautions: Fall Restrictions Weight Bearing Restrictions: No Other Position/Activity Restrictions: Patient c/o decreased sensation in Rt lateral foot and lower leg. Impairs weight bearing ability and safety with transfers/ambulation.      Mobility  Bed Mobility Overal bed mobility: Needs Assistance Bed Mobility: Supine to Sit     Supine to sit: Mod assist        Transfers Overall transfer level: Needs assistance Equipment used: None Transfers: Sit to/from [employer] Transfers Sit to Stand: Mod assist Stand pivot transfers: Max assist       General transfer comment: attempted transfers with RW; sit-to-stand and side-stepping to recliner completed with knees blocked and patient holding therapists arms; weight shifting required to unweight RLE to side-step to right.  Ambulation/Gait             General Gait Details: to be  further assess in subsequent PT sessions.  Stairs            Wheelchair Mobility    Modified Rankin (Stroke Patients Only)       Balance Overall balance assessment: Needs assistance Sitting-balance support: Feet supported;Bilateral upper extremity supported Sitting balance-Leahy Scale: Fair     Standing balance support: Bilateral upper extremity supported;During functional activity Standing balance-Leahy Scale: Poor                               Pertinent Vitals/Pain Pain Assessment: Faces Faces Pain Scale: Hurts little more Pain Location: right side, ribs, decreased sensation over Rt foot and lower leg. Pain Descriptors / Indicators: Sore;Numbness Pain Intervention(s): Limited activity within patient's tolerance;Monitored during session;Repositioned    Home Living Family/patient expects to be discharged to:: Inpatient rehab Living Arrangements: Alone               Additional Comments: asking her grandaughter to find her an assisted living facility.    Prior Function Level of Independence: Independent with assistive device(s)         Comments: Uses rollator     Hand Dominance   Dominant Hand: Right    Extremity/Trunk Assessment   Upper Extremity Assessment Upper Extremity Assessment: Generalized weakness    Lower Extremity Assessment Lower Extremity Assessment: Generalized weakness;RLE deficits/detail RLE Deficits / Details: Patient c/o decreased sensation in Rt lateral foot and lower leg. Impairs weight bearing ability and safety with transfers/ambulation. RLE Sensation: decreased light touch    Cervical / Trunk Assessment Cervical / Trunk Assessment: Kyphotic  Communication   Communication: No difficulties  Cognition Arousal/Alertness: Awake/alert Behavior During Therapy: WFL for tasks assessed/performed Overall Cognitive Status: No family/caregiver present to determine baseline cognitive functioning                                         General Comments      Exercises     Assessment/Plan    PT Assessment Patient needs continued PT services  PT Problem List Decreased strength;Pain;Decreased activity tolerance;Decreased balance;Decreased mobility;Impaired sensation       PT Treatment Interventions DME instruction;Therapeutic activities;Gait training;Therapeutic exercise;Patient/family education;Balance training;Functional mobility training    PT Goals (Current goals can be found in the Care Plan section)  Acute Rehab PT Goals Patient Stated Goal: go to rehab; maybe live in assisted living facility. PT Goal Formulation: With patient Time For Goal Achievement: 01/30/18 Potential to Achieve Goals: Fair    Frequency Min 2X/week   Barriers to discharge        Co-evaluation               AM-PAC PT "6 Clicks" Daily Activity  Outcome Measure Difficulty turning over in bed (including adjusting bedclothes, sheets and blankets)?: A Lot Difficulty moving from lying on back to sitting on the side of the bed? : A Lot Difficulty sitting down on and standing up from a chair with arms (e.g., wheelchair, bedside commode, etc,.)?: A Lot Help needed moving to and from a bed to chair (including a wheelchair)?: A Lot Help needed walking in hospital room?: Total Help needed climbing 3-5 steps with a railing? : Total 6 Click Score: 10    End of Session Equipment Utilized During Treatment: Gait belt Activity Tolerance: Patient limited by pain;Other (comment)(and c/o numbness in lateral Rt foot and lower leg.) Patient left: in chair;with chair alarm set;with nursing/sitter in room;with call bell/phone within reach Nurse Communication: Mobility status PT Visit Diagnosis: Unsteadiness on feet (R26.81);Other abnormalities of gait and mobility (R26.89);Difficulty in walking, not elsewhere classified (R26.2);History of falling (Z91.81);Muscle weakness (generalized) (M62.81)    Time:  1000-1030 PT Time Calculation (min) (ACUTE ONLY): 30 min   Charges:   PT Evaluation $PT Eval Moderate Complexity: 1 Mod PT Treatments $Therapeutic Activity: 8-22 mins   PT G Codes:        Shamiyah Ngu D. Hartnett-Rands, MS, PT [place] [phone] 01/16/2018, 10:48 AM

## 2018-01-16 NOTE — Care Management Obs Status (Signed)
[place] NOTIFICATION   Patient Details  Name: [name] MRN: [id number] Date of Birth: 04/05/1924   Medicare Observation Status Notification Given:  Yes    MahabirJuliann Pulse, RN 01/16/2018, 4:27 PM

## 2018-01-16 NOTE — Care Management CC44 (Signed)
Condition Code 44 Documentation Completed  Patient Details  Name: OMOLARA CAROL MRN: [id number] Date of Birth: 08-28-1923   Condition Code 44 given:  Yes Patient signature on Condition Code 44 notice:  Yes Documentation of 2 MD's agreement:  Yes Code 44 added to claim:  Yes    Dessa Phi, RN 01/16/2018, 4:28 PM

## 2018-01-17 DIAGNOSIS — R262 Difficulty in walking, not elsewhere classified: Secondary | ICD-10-CM | POA: Diagnosis not present

## 2018-01-17 DIAGNOSIS — N390 Urinary tract infection, site not specified: Secondary | ICD-10-CM | POA: Diagnosis not present

## 2018-01-17 DIAGNOSIS — G9341 Metabolic encephalopathy: Secondary | ICD-10-CM | POA: Diagnosis not present

## 2018-01-17 DIAGNOSIS — M6282 Rhabdomyolysis: Secondary | ICD-10-CM | POA: Diagnosis not present

## 2018-01-17 LAB — CBC
HEMATOCRIT: 36.7 % (ref 36.0–46.0)
Hemoglobin: 12.2 g/dL (ref 12.0–15.0)
MCH: 30.9 pg (ref 26.0–34.0)
MCHC: 33.2 g/dL (ref 30.0–36.0)
MCV: 92.9 fL (ref 78.0–100.0)
Platelets: 254 10*3/uL (ref 150–400)
RBC: 3.95 MIL/uL (ref 3.87–5.11)
RDW: 14.8 % (ref 11.5–15.5)
WBC: 7.6 10*3/uL (ref 4.0–10.5)

## 2018-01-17 LAB — BASIC METABOLIC PANEL
ANION GAP: 12 (ref 5–15)
BUN: 8 mg/dL (ref 8–23)
CO2: 24 mmol/L (ref 22–32)
Calcium: 8.3 mg/dL — ABNORMAL LOW (ref 8.9–10.3)
Chloride: 103 mmol/L (ref 98–111)
Creatinine, Ser: 0.64 mg/dL (ref 0.44–1.00)
GFR calc Af Amer: >60 mL/min (ref >60)
GFR calc non Af Amer: >60 mL/min (ref >60)
GLUCOSE: 150 mg/dL — AB (ref 70–99)
POTASSIUM: 3.9 mmol/L (ref 3.5–5.1)
Sodium: 139 mmol/L (ref 135–145)

## 2018-01-17 LAB — GLUCOSE, CAPILLARY
GLUCOSE-CAPILLARY: 132 mg/dL — AB (ref 70–99)
Glucose-Capillary: 132 mg/dL — ABNORMAL HIGH (ref 70–99)
Glucose-Capillary: 155 mg/dL — ABNORMAL HIGH (ref 70–99)
Glucose-Capillary: 192 mg/dL — ABNORMAL HIGH (ref 70–99)

## 2018-01-17 LAB — URINE CULTURE

## 2018-01-17 MED ORDER — CEFAZOLIN SODIUM-DEXTROSE 1-4 GM/50ML-% IV SOLN
1.0000 g | Freq: Two times a day (BID) | INTRAVENOUS | Status: DC
  Administered 2018-01-18: 1 g via INTRAVENOUS
  Filled 2018-01-17 (×2): qty 50

## 2018-01-17 NOTE — NC FL2 (Signed)
[place] LEVEL OF CARE SCREENING TOOL     IDENTIFICATION  Patient Name: [name] Birthdate: June 26, 1924 Sex: female Admission Date (Current Location): 01/15/2018  [facility] and [place] Number:  Herbalist and Address:  [facility],  [place], [place]      Provider Number: [id number]  Attending Physician Name and Address:  Kayleen Memos, DO  Relative Name and Phone Number:       Current Level of Care: Hospital Recommended Level of Care: [place] Prior Approval Number:    Date Approved/Denied:   PASRR Number: [id number]  Discharge Plan: SNF    Current Diagnoses: Patient Active Problem List   Diagnosis Date Noted  . Acute metabolic encephalopathy 61/60/7371  . Syncope 02/17/2017  . PVC's (premature ventricular contractions) 03/07/2016  . Depression 03/06/2016  . Chest pain at rest 03/06/2016  . Unstable angina ([place]) 10/24/2015  . Chronic anticoagulation for A fib, CHA2DS2VASc = 6 12/18/2014  . Chest pain, negative MI, negative Nuc, may be GI 12/17/2014  . Diabetes mellitus type II, non insulin dependent ([place])   . HTN (hypertension)   . Tachycardia 08/07/2011  . Atrial fibrillation, permanent 06/25/2011  . Near Syncope 06/06/2011  . Coronary artery disease 04/05/2011  . Bradycardia 12/09/2009  . ACUTE BRONCHITIS 06/17/2009  . COPD, moderate ([place]) 06/17/2009  . PULMONARY NODULE, RIGHT UPPER LOBE 06/07/2009  . SHORTNESS OF BREATH (SOB) 06/07/2009  . POSTURAL HYPOTENSION 05/23/2009  . DEHYDRATION 05/13/2009  . ABDOMINAL PAIN, LOWER 01/21/2009  . HOARSENESS, CHRONIC 12/03/2007  . GOITER 09/30/2007  . DIABETIC PERIPHERAL NEUROPATHY 09/30/2007  . OBESITY 09/30/2007  . HIATAL HERNIA 09/30/2007  . OSTEOARTHRITIS 09/30/2007  . SPINAL STENOSIS 09/30/2007  . PERIPHERAL EDEMA 09/30/2007  . HLD (hyperlipidemia) 02/12/2007  . ANEMIA-NOS 02/12/2007  . Neuropathy ([place]) 02/12/2007  . Allergic  rhinitis, cause unspecified 02/12/2007  . GERD 02/12/2007  . DIVERTICULOSIS, COLON 02/12/2007    Orientation RESPIRATION BLADDER Height & Weight     Self, Time, Situation, Place  Normal Continent Weight: 172 lb 3.2 oz (78.1 kg) Height:  5\' 3"  (160 cm)  BEHAVIORAL SYMPTOMS/MOOD NEUROLOGICAL BOWEL NUTRITION STATUS        Diet(see dc summary)  AMBULATORY STATUS COMMUNICATION OF NEEDS Skin   Extensive Assist Verbally Other (Comment)(MASD (Sacrum) Foam Dressing)                       Personal Care Assistance Level of Assistance  Bathing, Feeding, Dressing Bathing Assistance: Maximum assistance Feeding assistance: Independent Dressing Assistance: Maximum assistance     Functional Limitations Info  Sight, Hearing, Speech Sight Info: Adequate Hearing Info: Impaired Speech Info: Adequate    SPECIAL CARE FACTORS FREQUENCY  PT (By licensed PT), OT (By licensed OT)     PT Frequency: 5x/week OT Frequency: 5x/week            Contractures      Additional Factors Info  Code Status, Allergies Code Status Info: Full Code Allergies Info: Amoxicillin            Current Medications (01/17/2018):  This is the current hospital active medication list Current Facility-Administered Medications  Medication Dose Route Frequency Provider Last Rate Last Dose  . 0.9 %  sodium chloride infusion   Intravenous Continuous Kayleen Memos, DO 75 mL/hr at 01/17/18 0256    . acetaminophen (TYLENOL) tablet 650 mg  650 mg Oral Q6H PRN Kayleen Memos, DO  650 mg at 01/16/18 2152  . amLODipine (NORVASC) tablet 5 mg  5 mg Oral QHS Hall, Carole N, DO   5 mg at 01/16/18 2148  . apixaban (ELIQUIS) tablet 5 mg  5 mg Oral BID Irene Pap N, DO   5 mg at 01/17/18 0916  . [START ON 01/18/2018] ceFAZolin (ANCEF) IVPB 1 g/50 mL premix  1 g Intravenous Q12H Hall, Carole N, DO      . insulin aspart (novoLOG) injection 0-5 Units  0-5 Units Subcutaneous QHS Hall, Carole N, DO      . insulin aspart (novoLOG)  injection 0-9 Units  0-9 Units Subcutaneous TID WC Irene Pap N, DO   2 Units at 01/17/18 1236  . latanoprost (XALATAN) 0.005 % ophthalmic solution 1 drop  1 drop Both Eyes QHS Hall, Carole N, DO   1 drop at 01/16/18 2148  . metoprolol tartrate (LOPRESSOR) injection 2.5 mg  2.5 mg Intravenous Q2H PRN Irene Pap N, DO   2.5 mg at 01/15/18 2144  . ondansetron (ZOFRAN) injection 4 mg  4 mg Intravenous Q6H PRN Irene Pap N, DO      . oxybutynin (DITROPAN) tablet 5 mg  5 mg Oral Daily [place], Archie Patten N, DO   5 mg at 01/17/18 0916  . traZODone (DESYREL) tablet 50 mg  50 mg Oral QHS PRN Irene Pap N, DO   50 mg at 01/16/18 2152     Discharge Medications: Please see discharge summary for a list of discharge medications.  Relevant Imaging Results:  Relevant Lab Results:   Additional Information ssn# [id number]  Burnis Medin, LCSW

## 2018-01-17 NOTE — Progress Notes (Signed)
PROGRESS NOTE  [name] WER:[id number] DOB: 11/30/1923 DOA: 01/15/2018 PCP: Chesley Noon, MD  HPI/Recap of past 24 hours: [name] is a 84 y.o. female with medical history significant for chronic A. fib on Eliquis, type 2 diabetes, diastolic CHF, recurrent UTI, overactive bladder, who presented to ED [facility] due to altered mental status and a fall at home.  The history is obtained from the patient, her granddaughter, ED physician, and medical records.  Per the patient's granddaughter who is also her power of attorney she had been confused intermittently for the past 2 months.  This morning the patient was not answering her phone.  Her granddaughter became concerned and presented to her house where she was found on the floor.  Unclear how long she had been laying there.  At the time of this visit, the patient is alert and oriented x3.  She admits to falling and feeling dizzy prior to the fall.  Denies any chest pain dyspnea or palpitations.  Admits to hitting the back of her head and her right shoulder.  01/16/2018: Patient seen and examined at bedside.  Reports moderate dull pain in her right shoulder.  01/17/18: Pleasant, mildly confused. Lives alone. Would be an unsafe discharge to home. CSF consulted to assist w SNF placement.  Assessment/Plan: Active Problems:   Acute metabolic encephalopathy  Acute metabolic encephalopathy Appears to have a baseline mild dementia Waxes and wanes Reorient as needed Fall precautions  Recent fall/Ambulatory dysfunction Lives alone Concern for unsafe discharge to home PT evaluated and recommended SNF placement Continue fall precaution/ on anticoagulation eliquis OOb to chair with assist CSW to assist w SNF placement  Ecoli UTI, poa Urine culture greater than 100,000 colonies of gram-negative rods Resistant to ampicillin Continue IV ceftriaxone Monitor fever curve Monitor WBC Repeat CBC in the morning  Rhabdomyolysis  CPK is  trending down Continue IV fluid hydration Continue to monitor urine output Continue to monitor renal function Repeat BMP in the morning  Chronic A. fib Rate controlled Continue Eliquis  Type 2 diabetes Hemoglobin A1c 6.2 Continue insulin sliding scale Avoid hypoglycemia  Hypertension Blood pressures well controlled Continue Norvasc    Code Status: Full code  Family Communication: None at bedside  Disposition Plan: Hopefully SNF as she would be an unsafe discharge to home due to suspected underlying dementia and ambulatory dysfunction with recent fall on blood thinner.   Consultants:  none  Procedures:  None  Antimicrobials:  Iv ceftriaxone  DVT prophylaxis:  eliquis    Objective: Vitals:   01/16/18 1348 01/16/18 2120 01/17/18 0618 01/17/18 0619  BP: 102/88 138/73 103/87   Pulse: 95 84 78   Resp: 16 16 18    Temp: 97.8 F (36.6 C) 98.2 F (36.8 C) 98.8 F (37.1 C)   TempSrc: Oral     SpO2: 99% 98% 97%   Weight:    78.1 kg (172 lb 3.2 oz)  Height:        Intake/Output Summary (Last 24 hours) at 01/17/2018 1155 Last data filed at 01/17/2018 1000 Gross per 24 hour  Intake 3675.83 ml  Output 1800 ml  Net 1875.83 ml   Filed Weights   01/15/18 1816 01/16/18 0454 01/17/18 0619  Weight: 74.2 kg (163 lb 8 oz) 75.2 kg (165 lb 12.6 oz) 78.1 kg (172 lb 3.2 oz)    Exam:  . General: 84 y.o. year-old female WD wN NAD A&O x 3 . Cardiovascular:IRRR no rubs or gallops. No JVD or thyromegaly.  Marland Kitchen  Respiratory: Clear to auscultation with no wheezes or rales. Good inspiratory effort. . Abdomen: Soft nontender nondistended with normal bowel sounds x4 quadrants. . Musculoskeletal: No lower extremity edema. 2/4 pulses in all 4 extremities. . Skin: No ulcerative lesions noted or rashes . Psychiatry: Mood is appropriate for condition and setting   Data Reviewed: CBC: Recent Labs  Lab 01/15/18 1407 01/16/18 0424  WBC 11.3* 7.1  HGB 13.4 11.3*  HCT 39.7 34.6*    MCV 91.9 93.3  PLT 321 161   Basic Metabolic Panel: Recent Labs  Lab 01/15/18 1407 01/16/18 0424  NA 138 140  K 4.0 3.8  CL 99 103  CO2 29 27  GLUCOSE 163* 135*  BUN 14 12  CREATININE 0.77 0.75  CALCIUM 8.9 8.2*   GFR: Estimated Creatinine Clearance: 45.3 mL/min (by C-G formula based on SCr of 0.75 mg/dL). Liver Function Tests: Recent Labs  Lab 01/16/18 0424  AST 53*  ALT 32  ALKPHOS 53  BILITOT 0.8  PROT 5.5*  ALBUMIN 2.8*   No results for input(s): LIPASE, AMYLASE in the last 168 hours. No results for input(s): AMMONIA in the last 168 hours. Coagulation Profile: No results for input(s): INR, PROTIME in the last 168 hours. Cardiac Enzymes: Recent Labs  Lab 01/15/18 1407 01/16/18 0424  CKTOTAL 3,241* 1,155*   BNP (last 3 results) No results for input(s): PROBNP in the last 8760 hours. HbA1C: Recent Labs    01/15/18 1407  HGBA1C 6.2*   CBG: Recent Labs  Lab 01/16/18 0737 01/16/18 1222 01/16/18 1657 01/16/18 2119 01/17/18 0816  GLUCAP 119* 135* 147* 119* 132*   Lipid Profile: No results for input(s): CHOL, HDL, LDLCALC, TRIG, CHOLHDL, LDLDIRECT in the last 72 hours. Thyroid Function Tests: No results for input(s): TSH, T4TOTAL, FREET4, T3FREE, THYROIDAB in the last 72 hours. Anemia Panel: No results for input(s): VITAMINB12, FOLATE, FERRITIN, TIBC, IRON, RETICCTPCT in the last 72 hours. Urine analysis:    Component Value Date/Time   COLORURINE AMBER (A) 01/15/2018 1412   APPEARANCEUR HAZY (A) 01/15/2018 1412   LABSPEC 1.014 01/15/2018 1412   PHURINE 6.0 01/15/2018 1412   GLUCOSEU NEGATIVE 01/15/2018 1412   GLUCOSEU NEGATIVE 11/24/2010 1224   HGBUR SMALL (A) 01/15/2018 1412   BILIRUBINUR NEGATIVE 01/15/2018 1412   KETONESUR 20 (A) 01/15/2018 1412   PROTEINUR 30 (A) 01/15/2018 1412   UROBILINOGEN 0.2 05/05/2014 2123   NITRITE POSITIVE (A) 01/15/2018 1412   LEUKOCYTESUR MODERATE (A) 01/15/2018 1412   Sepsis  Labs: @LABRCNTIP (procalcitonin:4,lacticidven:4)  ) Recent Results (from the past 240 hour(s))  Urine culture     Status: Abnormal   Collection Time: 01/15/18  2:12 PM  Result Value Ref Range Status   Specimen Description   Final    URINE, CLEAN CATCH Performed at [facility], [place] [address] [place], [place] [address]    Special Requests   Final    NONE Performed at [facility], [place] [address] [place], [place] [address]    Culture >=100,000 COLONIES/mL ESCHERICHIA COLI (A)  Final   Report Status 01/17/2018 FINAL  Final   Organism ID, Bacteria ESCHERICHIA COLI (A)  Final      Susceptibility   Escherichia coli - MIC*    AMPICILLIN >=32 RESISTANT Resistant     CEFAZOLIN <=4 SENSITIVE Sensitive     CEFTRIAXONE <=1 SENSITIVE Sensitive     CIPROFLOXACIN <=0.25 SENSITIVE Sensitive     GENTAMICIN <=1 SENSITIVE Sensitive     IMIPENEM <=0.25 SENSITIVE Sensitive  NITROFURANTOIN <=16 SENSITIVE Sensitive     TRIMETH/SULFA <=20 SENSITIVE Sensitive     AMPICILLIN/SULBACTAM 16 INTERMEDIATE Intermediate     PIP/TAZO <=4 SENSITIVE Sensitive     Extended ESBL NEGATIVE Sensitive     * >=100,000 COLONIES/mL ESCHERICHIA COLI      Studies: No results found.  Scheduled Meds: . amLODipine  5 mg Oral QHS  . apixaban  5 mg Oral BID  . insulin aspart  0-5 Units Subcutaneous QHS  . insulin aspart  0-9 Units Subcutaneous TID WC  . latanoprost  1 drop Both Eyes QHS  . oxybutynin  5 mg Oral Daily    Continuous Infusions: . sodium chloride 75 mL/hr at 01/17/18 0256  . cefTRIAXone (ROCEPHIN)  IV Stopped (01/17/18 1015)     LOS: 1 day     Kayleen Memos, MD [facility] Pager [phone]  If 7PM-7AM, please contact night-coverage [email or web address] Password [facility] 01/17/2018, 11:55 AM

## 2018-01-17 NOTE — Clinical Social Work Note (Signed)
Clinical Social Work Assessment  Patient Details  Name: DELONNA NEY MRN: [id number] Date of Birth: 16-Oct-1923  Date of referral:  01/17/18               Reason for consult:  Facility Placement                Permission sought to share information with:  Facility Sport and exercise psychologist, Family Supports Permission granted to share information::  Yes, Verbal Permission Granted  Name::     Elbert Ewings  Agency::     Relationship::  Administrator Information:  [phone];  Housing/Transportation Living arrangements for the past 2 months:  [place] of Information:  Patient Patient Interpreter Needed:  None Criminal Activity/Legal Involvement Pertinent to Current Situation/Hospitalization:  No - Comment as needed Significant Relationships:  Siblings Lives with:  Self Do you feel safe going back to the place where you live?  (PT recommending SNF) Need for family participation in patient care:  Yes (Comment)  Care giving concerns:  Patient from home alone. Patient reported that she uses a walker at baseline and needs assistance with ADLs. Patient admitted with Acute metabolic encephalopathy. PT recommending SNF;Supervision for mobility/OOB.   Social Worker assessment / plan:  CSW spoke with patient at bedside regarding PT recommendation for SNF and discharge planning. Patient reported that she is agreeable to SNF and has been to SNF in the past. CSW explained SNF placement process and inquired if patient had any preferred SNFs. Patient requested that CSW speak with her granddaughter Colan Neptune) to get a SNF selection and discuss discharge planning further.  CSW contacted patient's granddaughter Colan Neptune) and discussed PT recommendation for SNF. Patient's granddaughter reported that she is agreeable to patient discharging to SNF for ST rehab. Patient's granddaughter reported that patient just discharged from a SNF on Monday, noting patient is in  copay days with [facility] Medicare. Patient's granddaughter reported that patient's previous SNF used patient's Medicaid to cover patient's copay and wanted to know if new SNF would cover because patient does not have money to cover copay days, CSW agreed to follow up with selected SNF. Patient's granddaughter reported that patient has had multiple falls this year and does not feel comfortable with patient returning home. Patient's granddaughter reported that patient was at [facility] a few years ago and really liked it. Patient's granddaughter reported that the preference is for patient to dc to [facility] and will transition to ALF after ST rehab is completed. Patient's granddaughter reported that she is in the process of  is setting up an ALF for patient to go to after SNF and that she has applied to have patient's medicaid transitioned into [place] term care medicaid.   CSW completed patient's FL2 and contacted admissions staff member Tabitha at [facility]. Staff confirmed ability to offer patient a bed and agreed to start insurance authorization. Staff reported that patient's copay should be covered by Medicaid if patient's medicaid is switched to [employer] term care medicaid.   CSW will continue to follow and assist with discharge planning.  Employment status:  Retired Nurse, adult, Medicaid In [place] PT Recommendations:  [place] / Referral to community resources:  [place]  Patient/Family's Response to care:  Patient/patient's granddaughter appreciative of CSW assistance with discharge planning.   Patient/Family's Understanding of and Emotional Response to Diagnosis, Current Treatment, and Prognosis:  Patient presented calm and verbalized understanding of current treatment  plan. Patient verbalized plan to dc to SNF for ST rehab. Patient's granddaughter involved in patient's care and verbalized understanding of patient's  current treatment. Patient's granddaughter verbalized concerns about patient living alone and reported that she is setting up an ALF for patient to go to after ST rehab at [facility].   Emotional Assessment Appearance:  Appears younger than stated age Attitude/Demeanor/Rapport:  Other(Cooperative) Affect (typically observed):  Appropriate, Calm Orientation:  Oriented to Self, Oriented to Place, Oriented to  Time, Oriented to Situation Alcohol / Substance use:  Not Applicable Psych involvement (Current and /or in the community):  No (Comment)  Discharge Needs  Concerns to be addressed:  Care Coordination Readmission within the last 30 days:  No Current discharge risk:  Lives alone, Physical Impairment Barriers to Discharge:  Ship broker, Continued Medical Work up   [employer], LCSW 01/17/2018, 3:38 PM

## 2018-01-18 DIAGNOSIS — M6282 Rhabdomyolysis: Secondary | ICD-10-CM | POA: Diagnosis not present

## 2018-01-18 DIAGNOSIS — G9341 Metabolic encephalopathy: Secondary | ICD-10-CM | POA: Diagnosis not present

## 2018-01-18 DIAGNOSIS — N390 Urinary tract infection, site not specified: Secondary | ICD-10-CM | POA: Diagnosis not present

## 2018-01-18 LAB — GLUCOSE, CAPILLARY
GLUCOSE-CAPILLARY: 132 mg/dL — AB (ref 70–99)
GLUCOSE-CAPILLARY: 144 mg/dL — AB (ref 70–99)

## 2018-01-18 LAB — BASIC METABOLIC PANEL
ANION GAP: 7 (ref 5–15)
BUN: 8 mg/dL (ref 8–23)
CALCIUM: 8.2 mg/dL — AB (ref 8.9–10.3)
CO2: 26 mmol/L (ref 22–32)
Chloride: 106 mmol/L (ref 98–111)
Creatinine, Ser: 0.77 mg/dL (ref 0.44–1.00)
Glucose, Bld: 139 mg/dL — ABNORMAL HIGH (ref 70–99)
POTASSIUM: 3.6 mmol/L (ref 3.5–5.1)
Sodium: 139 mmol/L (ref 135–145)

## 2018-01-18 LAB — CBC
HEMATOCRIT: 35.6 % — AB (ref 36.0–46.0)
Hemoglobin: 11.9 g/dL — ABNORMAL LOW (ref 12.0–15.0)
MCH: 31 pg (ref 26.0–34.0)
MCHC: 33.4 g/dL (ref 30.0–36.0)
MCV: 92.7 fL (ref 78.0–100.0)
PLATELETS: 252 10*3/uL (ref 150–400)
RBC: 3.84 MIL/uL — AB (ref 3.87–5.11)
RDW: 14.9 % (ref 11.5–15.5)
WBC: 7.2 10*3/uL (ref 4.0–10.5)

## 2018-01-18 LAB — MAGNESIUM: Magnesium: 1.8 mg/dL (ref 1.7–2.4)

## 2018-01-18 LAB — CK: Total CK: 198 U/L (ref 38–234)

## 2018-01-18 MED ORDER — CEPHALEXIN 500 MG PO CAPS: 500.0000 mg | ORAL_CAPSULE | Freq: Two times a day (BID) | ORAL | 0 refills | Status: DC

## 2018-01-18 MED ORDER — CEPHALEXIN 500 MG PO CAPS: 500.0000 mg | ORAL_CAPSULE | Freq: Two times a day (BID) | ORAL | 0 refills | Status: AC

## 2018-01-18 MED ORDER — MAGNESIUM OXIDE 400 (241.3 MG) MG PO TABS
800.0000 mg | ORAL_TABLET | Freq: Once | ORAL | Status: AC
  Administered 2018-01-18: 800 mg via ORAL
  Filled 2018-01-18: qty 2

## 2018-01-18 NOTE — Progress Notes (Signed)
Attempted to call report to [facility] place x 2 with no answer from nurses station. Will attempt to call report again when PTAR arrives to transfer patient.

## 2018-01-18 NOTE — Discharge Instructions (Signed)

## 2018-01-18 NOTE — Clinical Social Work Placement (Signed)
Patient received and accepted bed offer at [facility]. Facility aware of patient's discharge and confirmed bed offer (insurance authorization received). Patient's RN can call report to [phone] Room 704, packet complete. CSW signing off, no other needs identified at this time.  CLINICAL SOCIAL WORK PLACEMENT  NOTE  Date:  01/18/2018  Patient Details  Name: [name] MRN: [id number] Date of Birth: [birth year]  Clinical Social Work is seeking post-discharge placement for this patient at the [place] level of care (*CSW will initial, date and re-position this form in  chart as items are completed):  Yes   Patient/family provided with [place] Work Department's list of facilities offering this level of care within the geographic area requested by the patient (or if unable, by the patient's family).  Yes   Patient/family informed of their freedom to choose among providers that offer the needed level of care, that participate in Medicare, Medicaid or managed care program needed by the patient, have an available bed and are willing to accept the patient.  Yes   Patient/family informed of [facility]'s ownership interest in [facility] and [facility], as well as of the fact that they are under no obligation to receive care at these facilities.  PASRR submitted to EDS on       PASRR number received on       Existing PASRR number confirmed on 01/17/18     FL2 transmitted to all facilities in geographic area requested by pt/family on 01/17/18     FL2 transmitted to all facilities within larger geographic area on       Patient informed that his/her managed care company has contracts with or will negotiate with certain facilities, including the following:        Yes   Patient/family informed of bed offers received.  Patient chooses bed at [facility]     Physician recommends and patient chooses bed at      Patient to be transferred  to [facility] on 01/18/18.  Patient to be transferred to facility by [facility]     Patient family notified on 01/18/18 of transfer.  Name of family member notified:  Elbert Ewings     PHYSICIAN       Additional Comment:    _______________________________________________ Burnis Medin, LCSW 01/18/2018, 1:13 PM

## 2018-01-18 NOTE — Discharge Summary (Signed)
Discharge Summary  [name] IRJ:[id number] DOB: 03/05/1924  PCP: Chesley Noon, MD  Admit date: 01/15/2018 Discharge date: 01/18/2018  Time spent: 25 minutes  Recommendations for Outpatient Follow-up:  1. Take your medication as prescribed 2. Continue physical therapy to improve your strength 3. Follow-up with PCP posthospitalization 4. Defer to PCP for oral anticoagulation management  Discharge Diagnoses:  Active Hospital Problems   Diagnosis Date Noted  . Acute metabolic encephalopathy 30/16/0109    Resolved Hospital Problems  No resolved problems to display.    Discharge Condition: Stable  Diet recommendation: Resume previous diet  Vitals:   01/17/18 2101 01/18/18 0457  BP: 124/77 137/77  Pulse: 75 87  Resp: 18 18  Temp: 99.8 F (37.7 C) 98.5 F (36.9 C)  SpO2: 97% 97%    History of present illness:  [name] a 84 y.o.femalewith medical history significant forchronic A. fib on Eliquis, type 2 diabetes, diastolic CHF,recurrent UTI, overactive bladder, who presented to ED [facility] due to altered mental status and a fall at home. The history is obtained from the patient, her granddaughter, ED physician,and medical records. Per the patient's granddaughter who is also her power of attorney she had been confused intermittently forthe past 2 months. This morning the patient was not answering her phone. Her granddaughter became concerned and presented to her house where she was found on the floor. Unclear how long she had been laying there. At the time of this visit,the patient is alert and oriented x3. She admits to falling and feeling dizzy prior to the fall. Denies any chest pain dyspnea or palpitations.  Discussed oral anticoagulation with her medical POA Anderson [place] who opted to continue Eliquis for CVA prophylaxis. CT head with no contrast unremarkable for any acute intracranial abnormalities.  Hospital course complicated by generalized  weakness.  Assessed by PT who recommended SNF.  On the day of discharge, the patient was hemodynamically stable.  She will need to continue physical therapy to improve her strength.     Hospital Course:  Active Problems:   Acute metabolic encephalopathy  Acute metabolic encephalopathy, resolved Reorient as needed Fall precautions  Recent fall/generalized weakness PT evaluated and recommended SNF placement Continue fall precaution/ on anticoagulation eliquis  Ecoli UTI, poa Urine culture greater than 100,000 colonies E. coli Resistant to ampicillin Completed 3 days of IV antibiotics Start Keflex 500 mg twice daily x5 days  Rhabdomyolysis, resolving  CPK 3241 on presentation trending down Repeat CPK at SNF on Monday, January 20, 2018 Last creatinine 0.77 on 01/18/2018  Chronic A. fib Rate controlled Continue Eliquis  Type 2 diabetes Hemoglobin A1c 6.2 Avoid hypoglycemia  Hypertension Blood pressures well controlled Continue Norvasc    Procedures:  None  Consultations:  CSW  Discharge Exam: BP 137/77 (BP Location: Left Arm)   Pulse 87   Temp 98.5 F (36.9 C)   Resp 18   Ht 5\' 3"  (1.6 m)   Wt 77.1 kg (169 lb 15.6 oz)   SpO2 97%   BMI 30.11 kg/m  . General: 84 y.o. year-old female well developed well nourished in no acute distress.  Alert and oriented x3. . Cardiovascular: Irregular rate and rhythm with no rubs or gallops.  No thyromegaly or JVD noted.   Marland Kitchen Respiratory: Clear to auscultation with no wheezes or rales. Good inspiratory effort. . Abdomen: Soft nontender nondistended with normal bowel sounds x4 quadrants. . Musculoskeletal: Trace lower extremity edema. 2/4 pulses in all 4 extremities. Marland Kitchen Psychiatry: Mood is appropriate  for condition and setting  Discharge Instructions You were cared for by a hospitalist during your hospital stay. If you have any questions about your discharge medications or the care you received while you were in the  hospital after you are discharged, you can call the unit and asked to speak with the hospitalist on call if the hospitalist that took care of you is not available. Once you are discharged, your primary care physician will handle any further medical issues. Please note that NO REFILLS for any discharge medications will be authorized once you are discharged, as it is imperative that you return to your primary care physician (or establish a relationship with a primary care physician if you do not have one) for your aftercare needs so that they can reassess your need for medications and monitor your lab values.   Allergies as of 01/18/2018      Reactions   Amoxicillin Other (See Comments)   UNKNOWN      Medication List    STOP taking these medications   glipiZIDE 5 MG 24 hr tablet Commonly known as:  GLUCOTROL XL   LINZESS 145 MCG Caps capsule Generic drug:  linaclotide   traMADol 50 MG tablet Commonly known as:  ULTRAM     TAKE these medications   amLODipine 5 MG tablet Commonly known as:  NORVASC Take 5 mg by mouth at bedtime.   apixaban 5 MG Tabs tablet Commonly known as:  ELIQUIS Take 1 tablet (5 mg total) by mouth 2 (two) times daily.   atorvastatin 20 MG tablet Commonly known as:  LIPITOR Take 20 mg by mouth daily at 6 PM.   CALCIUM 500/D PO Take 1 tablet by mouth 2 (two) times daily.   cephALEXin 500 MG capsule Commonly known as:  KEFLEX Take 1 capsule (500 mg total) by mouth 2 (two) times daily for 5 days. What changed:  when to take this   diclofenac sodium 1 % Gel Commonly known as:  VOLTAREN Apply 2 g topically 4 (four) times daily.   gabapentin 600 MG tablet Commonly known as:  NEURONTIN Take 600 mg by mouth daily.   latanoprost 0.005 % ophthalmic solution Commonly known as:  XALATAN INSTILL 1 DROP INTO BOTH EYES AT BEDTINE FOR GLAUCOMA   linagliptin 5 MG Tabs tablet Commonly known as:  TRADJENTA Take 1 tablet (5 mg total) by mouth daily.   LUMIGAN  0.01 % Soln Generic drug:  bimatoprost Place 1 drop into both eyes at bedtime.   Melatonin 10 MG Tabs Take 10 mg by mouth at bedtime.   MULTIVITAMIN PO Take 1 tablet by mouth daily.   PRESERVISION AREDS PO Take 1 capsule by mouth 2 (two) times daily.   nitroGLYCERIN 0.4 MG SL tablet Commonly known as:  NITROSTAT Place 1 tablet (0.4 mg total) under the tongue every 5 (five) minutes as needed for chest pain (Max 3 doses).   oxybutynin 5 MG tablet Commonly known as:  DITROPAN Take 5 mg by mouth daily.   polyethylene glycol packet Commonly known as:  MIRALAX / GLYCOLAX Take 17 g by mouth daily as needed for mild constipation.   traZODone 50 MG tablet Commonly known as:  DESYREL Take 50 mg by mouth at bedtime as needed for sleep.      Allergies  Allergen Reactions  . Amoxicillin Other (See Comments)    UNKNOWN   Follow-up Information    Chesley Noon, MD. Call in 1 day(s).   Specialty:  Family  Medicine Why:  Please call for an appointment Contact information: [place] [address] [phone]            The results of significant diagnostics from this hospitalization (including imaging, microbiology, ancillary and laboratory) are listed below for reference.    Significant Diagnostic Studies: Dg Chest 2 View  Result Date: 01/15/2018 CLINICAL DATA:  Chest pain.  Unwitnessed fall. EXAM: CHEST - 2 VIEW COMPARISON:  Radiographs of September 26, 2017. FINDINGS: Stable cardiomediastinal silhouette. No pneumothorax or pleural effusion is noted. Both lungs are clear. The visualized skeletal structures are unremarkable. IMPRESSION: No active cardiopulmonary disease. Electronically Signed   By: Marijo Conception, M.D.   On: 01/15/2018 15:38   Ct Head Wo Contrast  Result Date: 01/15/2018 CLINICAL DATA:  Trauma. EXAM: CT HEAD WITHOUT CONTRAST CT CERVICAL SPINE WITHOUT CONTRAST TECHNIQUE: Multidetector CT imaging of the head and cervical spine was performed  following the standard protocol without intravenous contrast. Multiplanar CT image reconstructions of the cervical spine were also generated. COMPARISON:  CT scan of October 20, 2017. FINDINGS: CT HEAD FINDINGS Brain: Mild diffuse cortical atrophy is noted. Moderate chronic ischemic white matter disease is noted. No mass effect or midline shift is noted. Ventricular size is within normal limits. There is no evidence of mass lesion, hemorrhage or acute infarction. Vascular: No hyperdense vessel or unexpected calcification. Skull: Normal. Negative for fracture or focal lesion. Sinuses/Orbits: No acute finding. Other: None. CT CERVICAL SPINE FINDINGS Alignment: Normal. Skull base and vertebrae: No acute fracture. No primary bone lesion or focal pathologic process. Soft tissues and spinal canal: No prevertebral fluid or swelling. No visible canal hematoma. Disc levels: Anterior osteophyte formation is noted anteriorly at C5-6, C6-7 and T1-2. Upper chest: Negative. Other: Degenerative changes are seen involving posterior facet joints bilaterally. IMPRESSION: Mild diffuse cortical atrophy. Moderate chronic ischemic white matter disease. No acute intracranial abnormality seen. Degenerative changes as described above. No acute abnormality seen in the cervical spine. Electronically Signed   By: Marijo Conception, M.D.   On: 01/15/2018 15:27   Ct Cervical Spine Wo Contrast  Result Date: 01/15/2018 CLINICAL DATA:  Trauma. EXAM: CT HEAD WITHOUT CONTRAST CT CERVICAL SPINE WITHOUT CONTRAST TECHNIQUE: Multidetector CT imaging of the head and cervical spine was performed following the standard protocol without intravenous contrast. Multiplanar CT image reconstructions of the cervical spine were also generated. COMPARISON:  CT scan of October 20, 2017. FINDINGS: CT HEAD FINDINGS Brain: Mild diffuse cortical atrophy is noted. Moderate chronic ischemic white matter disease is noted. No mass effect or midline shift is noted. Ventricular  size is within normal limits. There is no evidence of mass lesion, hemorrhage or acute infarction. Vascular: No hyperdense vessel or unexpected calcification. Skull: Normal. Negative for fracture or focal lesion. Sinuses/Orbits: No acute finding. Other: None. CT CERVICAL SPINE FINDINGS Alignment: Normal. Skull base and vertebrae: No acute fracture. No primary bone lesion or focal pathologic process. Soft tissues and spinal canal: No prevertebral fluid or swelling. No visible canal hematoma. Disc levels: Anterior osteophyte formation is noted anteriorly at C5-6, C6-7 and T1-2. Upper chest: Negative. Other: Degenerative changes are seen involving posterior facet joints bilaterally. IMPRESSION: Mild diffuse cortical atrophy. Moderate chronic ischemic white matter disease. No acute intracranial abnormality seen. Degenerative changes as described above. No acute abnormality seen in the cervical spine. Electronically Signed   By: Marijo Conception, M.D.   On: 01/15/2018 15:27   Dg Hips Bilat W Or Wo Pelvis 3-4  Views  Result Date: 01/15/2018 CLINICAL DATA:  Bilateral hip pain after unwitnessed fall. EXAM: DG HIP (WITH OR WITHOUT PELVIS) 3-4V BILAT COMPARISON:  None. FINDINGS: There is no evidence of hip fracture or dislocation. There is no evidence of arthropathy or other focal bone abnormality. IMPRESSION: Normal bilateral hips. Electronically Signed   By: Marijo Conception, M.D.   On: 01/15/2018 15:39    Microbiology: Recent Results (from the past 240 hour(s))  Urine culture     Status: Abnormal   Collection Time: 01/15/18  2:12 PM  Result Value Ref Range Status   Specimen Description   Final    URINE, CLEAN CATCH Performed at [facility], [place] [address], [place], [place] [address]    Special Requests   Final    NONE Performed at [facility], [place] [address], [place], [place] [address]    Culture >=100,000 COLONIES/mL ESCHERICHIA COLI (A)  Final   Report Status  01/17/2018 FINAL  Final   Organism ID, Bacteria ESCHERICHIA COLI (A)  Final      Susceptibility   Escherichia coli - MIC*    AMPICILLIN >=32 RESISTANT Resistant     CEFAZOLIN <=4 SENSITIVE Sensitive     CEFTRIAXONE <=1 SENSITIVE Sensitive     CIPROFLOXACIN <=0.25 SENSITIVE Sensitive     GENTAMICIN <=1 SENSITIVE Sensitive     IMIPENEM <=0.25 SENSITIVE Sensitive     NITROFURANTOIN <=16 SENSITIVE Sensitive     TRIMETH/SULFA <=20 SENSITIVE Sensitive     AMPICILLIN/SULBACTAM 16 INTERMEDIATE Intermediate     PIP/TAZO <=4 SENSITIVE Sensitive     Extended ESBL NEGATIVE Sensitive     * >=100,000 COLONIES/mL ESCHERICHIA COLI     Labs: Basic Metabolic Panel: Recent Labs  Lab 01/15/18 1407 01/16/18 0424 01/17/18 1253 01/18/18 0444  NA 138 140 139 139  K 4.0 3.8 3.9 3.6  CL 99 103 103 106  CO2 29 27 24 26   GLUCOSE 163* 135* 150* 139*  BUN 14 12 8 8   CREATININE 0.77 0.75 0.64 0.77  CALCIUM 8.9 8.2* 8.3* 8.2*  MG  --   --   --  1.8   Liver Function Tests: Recent Labs  Lab 01/16/18 0424  AST 53*  ALT 32  ALKPHOS 53  BILITOT 0.8  PROT 5.5*  ALBUMIN 2.8*   No results for input(s): LIPASE, AMYLASE in the last 168 hours. No results for input(s): AMMONIA in the last 168 hours. CBC: Recent Labs  Lab 01/15/18 1407 01/16/18 0424 01/17/18 1253 01/18/18 0444  WBC 11.3* 7.1 7.6 7.2  HGB 13.4 11.3* 12.2 11.9*  HCT 39.7 34.6* 36.7 35.6*  MCV 91.9 93.3 92.9 92.7  PLT 321 240 254 252   Cardiac Enzymes: Recent Labs  Lab 01/15/18 1407 01/16/18 0424  CKTOTAL 3,241* 1,155*   BNP: BNP (last 3 results) No results for input(s): BNP in the last 8760 hours.  ProBNP (last 3 results) No results for input(s): PROBNP in the last 8760 hours.  CBG: Recent Labs  Lab 01/17/18 0816 01/17/18 1225 01/17/18 1746 01/17/18 2134 01/18/18 0811  GLUCAP 132* 155* 192* 132* 132*       Signed:  Kayleen Memos, MD [facility] 01/18/2018, 12:13 PM

## 2018-01-18 NOTE — Progress Notes (Signed)
Attempted to call report to [facility] place no answer. PAtient discharged to facility via [facility].

## 2018-01-18 NOTE — Progress Notes (Signed)
PT Cancellation Note  Patient Details Name: [name] MRN: [id number] DOB: October 01, 1923   Cancelled Treatment:    Reason Eval/Treat Not Completed: Other (comment); noted order for imminent d/c, pt has already been evaluated by PT and has plan in place, to go to SNF today; a second PT eval/re-eval  is not indicated at this time   [facility] 01/18/2018, 2:08 PM

## 2018-03-20 ENCOUNTER — Observation Stay (HOSPITAL_COMMUNITY)
Admission: EM | Admit: 2018-03-20 | Discharge: 2018-03-21 | Disposition: A | Discharge status: Skilled Nursing Facility | Payer: Medicare Other | Attending: Internal Medicine | Admitting: Internal Medicine

## 2018-03-20 ENCOUNTER — Emergency Department (HOSPITAL_COMMUNITY): Payer: Medicare Other

## 2018-03-20 ENCOUNTER — Encounter (HOSPITAL_COMMUNITY): Payer: Self-pay | Admitting: Emergency Medicine

## 2018-03-20 ENCOUNTER — Other Ambulatory Visit: Payer: Self-pay

## 2018-03-20 DIAGNOSIS — N183 Chronic kidney disease, stage 3 (moderate): Secondary | ICD-10-CM | POA: Insufficient documentation

## 2018-03-20 DIAGNOSIS — R079 Chest pain, unspecified: Secondary | ICD-10-CM

## 2018-03-20 DIAGNOSIS — M7989 Other specified soft tissue disorders: Secondary | ICD-10-CM | POA: Diagnosis not present

## 2018-03-20 DIAGNOSIS — Z8719 Personal history of other diseases of the digestive system: Secondary | ICD-10-CM | POA: Insufficient documentation

## 2018-03-20 DIAGNOSIS — I4510 Unspecified right bundle-branch block: Secondary | ICD-10-CM | POA: Diagnosis not present

## 2018-03-20 DIAGNOSIS — Z9049 Acquired absence of other specified parts of digestive tract: Secondary | ICD-10-CM | POA: Insufficient documentation

## 2018-03-20 DIAGNOSIS — E669 Obesity, unspecified: Secondary | ICD-10-CM | POA: Diagnosis not present

## 2018-03-20 DIAGNOSIS — J90 Pleural effusion, not elsewhere classified: Secondary | ICD-10-CM

## 2018-03-20 DIAGNOSIS — N179 Acute kidney failure, unspecified: Secondary | ICD-10-CM | POA: Diagnosis not present

## 2018-03-20 DIAGNOSIS — Z880 Allergy status to penicillin: Secondary | ICD-10-CM | POA: Diagnosis not present

## 2018-03-20 DIAGNOSIS — R0789 Other chest pain: Secondary | ICD-10-CM | POA: Diagnosis present

## 2018-03-20 DIAGNOSIS — Z79899 Other long term (current) drug therapy: Secondary | ICD-10-CM | POA: Insufficient documentation

## 2018-03-20 DIAGNOSIS — Z66 Do not resuscitate: Secondary | ICD-10-CM | POA: Insufficient documentation

## 2018-03-20 DIAGNOSIS — R918 Other nonspecific abnormal finding of lung field: Secondary | ICD-10-CM | POA: Diagnosis not present

## 2018-03-20 DIAGNOSIS — Z6828 Body mass index (BMI) 28.0-28.9, adult: Secondary | ICD-10-CM | POA: Insufficient documentation

## 2018-03-20 DIAGNOSIS — Z7901 Long term (current) use of anticoagulants: Secondary | ICD-10-CM | POA: Diagnosis not present

## 2018-03-20 DIAGNOSIS — I252 Old myocardial infarction: Secondary | ICD-10-CM | POA: Diagnosis not present

## 2018-03-20 DIAGNOSIS — I482 Chronic atrial fibrillation: Secondary | ICD-10-CM | POA: Insufficient documentation

## 2018-03-20 DIAGNOSIS — K219 Gastro-esophageal reflux disease without esophagitis: Secondary | ICD-10-CM | POA: Insufficient documentation

## 2018-03-20 DIAGNOSIS — M25512 Pain in left shoulder: Secondary | ICD-10-CM | POA: Insufficient documentation

## 2018-03-20 DIAGNOSIS — M069 Rheumatoid arthritis, unspecified: Secondary | ICD-10-CM | POA: Diagnosis not present

## 2018-03-20 DIAGNOSIS — E785 Hyperlipidemia, unspecified: Secondary | ICD-10-CM | POA: Insufficient documentation

## 2018-03-20 DIAGNOSIS — Z90710 Acquired absence of both cervix and uterus: Secondary | ICD-10-CM | POA: Insufficient documentation

## 2018-03-20 DIAGNOSIS — Z8249 Family history of ischemic heart disease and other diseases of the circulatory system: Secondary | ICD-10-CM | POA: Insufficient documentation

## 2018-03-20 DIAGNOSIS — F0390 Unspecified dementia without behavioral disturbance: Secondary | ICD-10-CM | POA: Diagnosis not present

## 2018-03-20 DIAGNOSIS — K581 Irritable bowel syndrome with constipation: Secondary | ICD-10-CM | POA: Insufficient documentation

## 2018-03-20 DIAGNOSIS — I2510 Atherosclerotic heart disease of native coronary artery without angina pectoris: Secondary | ICD-10-CM | POA: Insufficient documentation

## 2018-03-20 DIAGNOSIS — I129 Hypertensive chronic kidney disease with stage 1 through stage 4 chronic kidney disease, or unspecified chronic kidney disease: Secondary | ICD-10-CM | POA: Insufficient documentation

## 2018-03-20 DIAGNOSIS — E1122 Type 2 diabetes mellitus with diabetic chronic kidney disease: Secondary | ICD-10-CM | POA: Diagnosis not present

## 2018-03-20 DIAGNOSIS — Z9889 Other specified postprocedural states: Secondary | ICD-10-CM | POA: Insufficient documentation

## 2018-03-20 DIAGNOSIS — E1142 Type 2 diabetes mellitus with diabetic polyneuropathy: Secondary | ICD-10-CM | POA: Insufficient documentation

## 2018-03-20 DIAGNOSIS — Z85828 Personal history of other malignant neoplasm of skin: Secondary | ICD-10-CM | POA: Insufficient documentation

## 2018-03-20 DIAGNOSIS — Z95820 Peripheral vascular angioplasty status with implants and grafts: Secondary | ICD-10-CM | POA: Insufficient documentation

## 2018-03-20 LAB — BASIC METABOLIC PANEL
Anion gap: 7 (ref 5–15)
BUN: 12 mg/dL (ref 8–23)
CHLORIDE: 104 mmol/L (ref 98–111)
CO2: 30 mmol/L (ref 22–32)
CREATININE: 0.87 mg/dL (ref 0.44–1.00)
Calcium: 8.8 mg/dL — ABNORMAL LOW (ref 8.9–10.3)
GFR calc non Af Amer: 56 mL/min — ABNORMAL LOW (ref >60)
Glucose, Bld: 109 mg/dL — ABNORMAL HIGH (ref 70–99)
POTASSIUM: 4.4 mmol/L (ref 3.5–5.1)
Sodium: 141 mmol/L (ref 135–145)

## 2018-03-20 LAB — CBC
HEMATOCRIT: 34 % — AB (ref 36.0–46.0)
HEMOGLOBIN: 10.7 g/dL — AB (ref 12.0–15.0)
MCH: 29.9 pg (ref 26.0–34.0)
MCHC: 31.5 g/dL (ref 30.0–36.0)
MCV: 95 fL (ref 78.0–100.0)
PLATELETS: 167 10*3/uL (ref 150–400)
RBC: 3.58 MIL/uL — AB (ref 3.87–5.11)
RDW: 15.1 % (ref 11.5–15.5)
WBC: 5.2 10*3/uL (ref 4.0–10.5)

## 2018-03-20 LAB — GLUCOSE, CAPILLARY
GLUCOSE-CAPILLARY: 122 mg/dL — AB (ref 70–99)
Glucose-Capillary: 109 mg/dL — ABNORMAL HIGH (ref 70–99)
Glucose-Capillary: 106 mg/dL — ABNORMAL HIGH (ref 70–99)

## 2018-03-20 LAB — TROPONIN I: Troponin I: <0.03 ng/mL (ref <0.03)

## 2018-03-20 LAB — I-STAT TROPONIN, ED
TROPONIN I, POC: 0.02 ng/mL (ref 0.00–0.08)
Troponin i, poc: 0.01 ng/mL (ref 0.00–0.08)

## 2018-03-20 LAB — MAGNESIUM: MAGNESIUM: 1.7 mg/dL (ref 1.7–2.4)

## 2018-03-20 LAB — BRAIN NATRIURETIC PEPTIDE: B NATRIURETIC PEPTIDE 5: 204.7 pg/mL — AB (ref 0.0–100.0)

## 2018-03-20 MED ORDER — DICLOFENAC SODIUM 1 % TD GEL
2.0000 g | Freq: Four times a day (QID) | TRANSDERMAL | Status: DC
  Administered 2018-03-20 – 2018-03-21 (×5): 2 g via TOPICAL
  Filled 2018-03-20: qty 100

## 2018-03-20 MED ORDER — ACETAMINOPHEN 325 MG PO TABS: 650.0000 mg | ORAL_TABLET | Freq: Four times a day (QID) | ORAL | Status: DC | PRN

## 2018-03-20 MED ORDER — ACETAMINOPHEN 650 MG RE SUPP: 650.0000 mg | Freq: Four times a day (QID) | RECTAL | Status: DC | PRN

## 2018-03-20 MED ORDER — ATORVASTATIN CALCIUM 20 MG PO TABS
20.0000 mg | ORAL_TABLET | Freq: Every day | ORAL | Status: DC
  Administered 2018-03-20: 20 mg via ORAL
  Filled 2018-03-20: qty 1

## 2018-03-20 MED ORDER — POLYETHYLENE GLYCOL 3350 17 G PO PACK: 17.0000 g | PACK | Freq: Every day | ORAL | Status: DC | PRN

## 2018-03-20 MED ORDER — MELATONIN 3 MG PO TABS
9.0000 mg | ORAL_TABLET | Freq: Every day | ORAL | Status: DC
  Administered 2018-03-20: 9 mg via ORAL
  Filled 2018-03-20 (×2): qty 3

## 2018-03-20 MED ORDER — DONEPEZIL HCL 5 MG PO TABS
10.0000 mg | ORAL_TABLET | Freq: Every day | ORAL | Status: DC
  Administered 2018-03-20: 10 mg via ORAL
  Filled 2018-03-20: qty 2

## 2018-03-20 MED ORDER — LISINOPRIL 10 MG PO TABS
10.0000 mg | ORAL_TABLET | Freq: Every day | ORAL | Status: DC
  Administered 2018-03-20 – 2018-03-21 (×2): 10 mg via ORAL
  Filled 2018-03-20 (×2): qty 1

## 2018-03-20 MED ORDER — APIXABAN 5 MG PO TABS
5.0000 mg | ORAL_TABLET | Freq: Two times a day (BID) | ORAL | Status: DC
  Administered 2018-03-20 – 2018-03-21 (×3): 5 mg via ORAL
  Filled 2018-03-20 (×3): qty 1

## 2018-03-20 MED ORDER — DIVALPROEX SODIUM 125 MG PO DR TAB
125.0000 mg | DELAYED_RELEASE_TABLET | Freq: Two times a day (BID) | ORAL | Status: DC
  Administered 2018-03-20 – 2018-03-21 (×3): 125 mg via ORAL
  Filled 2018-03-20 (×4): qty 1

## 2018-03-20 MED ORDER — SENNOSIDES-DOCUSATE SODIUM 8.6-50 MG PO TABS
1.0000 | ORAL_TABLET | Freq: Two times a day (BID) | ORAL | Status: DC
  Administered 2018-03-20 – 2018-03-21 (×3): 1 via ORAL
  Filled 2018-03-20 (×3): qty 1

## 2018-03-20 MED ORDER — TRAZODONE HCL 50 MG PO TABS
50.0000 mg | ORAL_TABLET | Freq: Every day | ORAL | Status: DC
  Administered 2018-03-20: 50 mg via ORAL
  Filled 2018-03-20: qty 1

## 2018-03-20 MED ORDER — GABAPENTIN 300 MG PO CAPS
600.0000 mg | ORAL_CAPSULE | Freq: Every day | ORAL | Status: DC
  Administered 2018-03-20: 600 mg via ORAL
  Filled 2018-03-20: qty 2

## 2018-03-20 MED ORDER — ACETAMINOPHEN 500 MG PO TABS
1000.0000 mg | ORAL_TABLET | Freq: Once | ORAL | Status: AC
Start: 2018-03-20 — End: 2018-03-20
  Administered 2018-03-20: 1000 mg via ORAL
  Filled 2018-03-20: qty 2

## 2018-03-20 MED ORDER — OXYBUTYNIN CHLORIDE 5 MG PO TABS
5.0000 mg | ORAL_TABLET | Freq: Every day | ORAL | Status: DC
  Administered 2018-03-20 – 2018-03-21 (×2): 5 mg via ORAL
  Filled 2018-03-20 (×2): qty 1

## 2018-03-20 MED ORDER — LINAGLIPTIN 5 MG PO TABS
5.0000 mg | ORAL_TABLET | Freq: Every day | ORAL | Status: DC
  Administered 2018-03-20 – 2018-03-21 (×2): 5 mg via ORAL
  Filled 2018-03-20 (×2): qty 1

## 2018-03-20 MED ORDER — MAGNESIUM SULFATE 2 GM/50ML IV SOLN
2.0000 g | Freq: Once | INTRAVENOUS | Status: AC
  Administered 2018-03-20: 2 g via INTRAVENOUS
  Filled 2018-03-20: qty 50

## 2018-03-20 MED ORDER — LATANOPROST 0.005 % OP SOLN
1.0000 [drp] | Freq: Every day | OPHTHALMIC | Status: DC
  Administered 2018-03-20: 1 [drp] via OPHTHALMIC
  Filled 2018-03-20: qty 2.5

## 2018-03-20 MED ORDER — GABAPENTIN 600 MG PO TABS
300.0000 mg | ORAL_TABLET | Freq: Every day | ORAL | Status: DC
  Administered 2018-03-20 – 2018-03-21 (×2): 300 mg via ORAL
  Filled 2018-03-20 (×2): qty 1

## 2018-03-20 MED ORDER — INSULIN ASPART 100 UNIT/ML ~~LOC~~ SOLN
0.0000 [IU] | Freq: Three times a day (TID) | SUBCUTANEOUS | Status: DC
  Administered 2018-03-20 – 2018-03-21 (×2): 1 [IU] via SUBCUTANEOUS

## 2018-03-20 NOTE — Progress Notes (Signed)
Patient to 4E room 14 at this time. Report received from [place], [place] in the ED. Telemetry applied and v/s done. CCMD notified.  Assessment complete. CHG bath done. Patient oriented to room and how to call nurse with any needs.

## 2018-03-20 NOTE — ED Notes (Signed)
Called lab and added on BNP

## 2018-03-20 NOTE — ED Provider Notes (Signed)
[facility] EMERGENCY DEPARTMENT Provider Note  CSN: [id number] Arrival date & time: 03/20/18 0434  Chief Complaint(s) Chest Pain  HPI [name] is a 84 y.o. female   The history is provided by the patient.  Chest Pain   This is a new problem. The current episode started 3 to 5 hours ago. The problem occurs constantly. The problem has been gradually improving. The pain is associated with rest. Pain location: left upper chest and shoulder. The pain is at a severity of 4/10. The pain is moderate. The quality of the pain is described as pressure-like. The pain radiates to the left arm and left neck. Pertinent negatives include no cough, no fever, no headaches, no nausea, no shortness of breath, no sputum production and no vomiting. She has tried nitroglycerin for the symptoms. The treatment provided significant relief. Risk factors include being elderly.  Her past medical history is significant for arrhythmia (a.fib on Eliquis), CAD (s/p dES), diabetes, hyperlipidemia and hypertension.    Past Medical History Past Medical History:  Diagnosis Date  . 1st degree AV block   . Acute renal failure ([place])    due to dehydration-Sept 2009; Normal creat 1.2 on fu 05/13/2009  . Allergic rhinitis   . Anemia    NOS chronic disease. Hgb 11.6gm% 04/14/2009  . Bradycardia   . CAD (coronary artery disease)    a. s/p DES mLAD 2005. b. Cutting balloon angioplasty for ISR 2006. c. LHC 08/2010 nonobstructive. d. Myoview 06/2012: small anteroapical infarct/mod inferolat wall infarct but no ischemia, EF 75%.  . Cancer ([place])    on nose and had it removed  . Chronic anticoagulation   . CKD (chronic kidney disease), stage III ([place])   . Dementia    slight  . Diabetes mellitus type II   . Diverticulitis of colon   . DM neuropathy, type II diabetes mellitus ([place])   . GERD (gastroesophageal reflux disease)   . HTN (hypertension)   . Hx of colonoscopy   . Hyperlipidemia   . IBS  (irritable bowel syndrome)   . Multinodular goiter   . Obesity    BMI-37  . Orthostatic hypotension   . Osteoarthritis    lower back  . Osteoporosis   . Pneumonia   . PVC's (premature ventricular contractions)    a. seen on telemetry 02/2016.  Marland Kitchen RBBB   . Rheumatoid arthritis ([place])   . Spinal stenosis    djd lumbar   Patient Active Problem List   Diagnosis Date Noted  . Acute metabolic encephalopathy 46/80/3212  . Syncope 02/17/2017  . PVC's (premature ventricular contractions) 03/07/2016  . Depression 03/06/2016  . Chest pain at rest 03/06/2016  . Unstable angina ([place]) 10/24/2015  . Chronic anticoagulation for A fib, CHA2DS2VASc = 6 12/18/2014  . Chest pain, negative MI, negative Nuc, may be GI 12/17/2014  . Diabetes mellitus type II, non insulin dependent ([place])   . HTN (hypertension)   . Tachycardia 08/07/2011  . Atrial fibrillation, permanent 06/25/2011  . Near Syncope 06/06/2011  . Coronary artery disease 04/05/2011  . Bradycardia 12/09/2009  . ACUTE BRONCHITIS 06/17/2009  . COPD, moderate ([place]) 06/17/2009  . PULMONARY NODULE, RIGHT UPPER LOBE 06/07/2009  . SHORTNESS OF BREATH (SOB) 06/07/2009  . POSTURAL HYPOTENSION 05/23/2009  . DEHYDRATION 05/13/2009  . ABDOMINAL PAIN, LOWER 01/21/2009  . HOARSENESS, CHRONIC 12/03/2007  . GOITER 09/30/2007  . DIABETIC PERIPHERAL NEUROPATHY 09/30/2007  . OBESITY 09/30/2007  . HIATAL HERNIA 09/30/2007  . OSTEOARTHRITIS  09/30/2007  . SPINAL STENOSIS 09/30/2007  . PERIPHERAL EDEMA 09/30/2007  . HLD (hyperlipidemia) 02/12/2007  . ANEMIA-NOS 02/12/2007  . Neuropathy ([place]) 02/12/2007  . Allergic rhinitis, cause unspecified 02/12/2007  . GERD 02/12/2007  . DIVERTICULOSIS, COLON 02/12/2007   Home Medication(s) Prior to Admission medications   Medication Sig Start Date End Date Taking? Authorizing Provider  acetaminophen (TYLENOL) 500 MG tablet Take 1,000 mg by mouth 3 (three) times daily.   Yes [provider]  apixaban  (ELIQUIS) 5 MG TABS tablet Take 1 tablet (5 mg total) by mouth 2 (two) times daily. 01/31/15  Yes Weaver, Scott T, PA-C  atorvastatin (LIPITOR) 20 MG tablet Take 20 mg by mouth daily at 6 PM.    Yes [provider]  Calcium Carbonate-Vitamin D (CALCIUM 500/D PO) Take 1 tablet by mouth 2 (two) times daily.    Yes [provider]  diclofenac sodium (VOLTAREN) 1 % GEL Apply 2 g topically 4 (four) times daily. 10/22/17  Yes Vann, Jessica U, DO  divalproex (DEPAKOTE) 125 MG DR tablet Take 125 mg by mouth 2 (two) times daily.   Yes [provider]  donepezil (ARICEPT) 10 MG tablet Take 10 mg by mouth at bedtime.   Yes [provider]  gabapentin (NEURONTIN) 600 MG tablet Take 300-600 mg by mouth 2 (two) times daily. Take 300 mg in the morning and 600 mg in the evening 01/06/18  Yes [provider]  guaiFENesin 200 MG tablet Take 200 mg by mouth every 6 (six) hours.   Yes [provider]  latanoprost (XALATAN) 0.005 % ophthalmic solution INSTILL 1 DROP INTO BOTH EYES AT [place] GLAUCOMA 11/25/17  Yes [provider]  linagliptin (TRADJENTA) 5 MG TABS tablet Take 1 tablet (5 mg total) by mouth daily. 10/22/17  Yes Vann, Jessica U, DO  Liniments (SALONPAS EX) Apply 1 patch topically daily.   Yes [provider]  lisinopril (PRINIVIL,ZESTRIL) 10 MG tablet Take 10 mg by mouth daily.   Yes [provider]  loratadine (CLARITIN) 10 MG tablet Take 10 mg by mouth daily.   Yes [provider]  LUMIGAN 0.01 % SOLN Place 1 drop into both eyes at bedtime. 02/11/17  Yes [provider]  Melatonin 10 MG TABS Take 10 mg by mouth at bedtime.   Yes [provider]  Multiple Vitamins-Minerals (MULTIVITAMIN PO) Take 1 tablet by mouth daily.   Yes [provider]  Multiple Vitamins-Minerals (PRESERVISION AREDS PO) Take 1 capsule by mouth 2 (two) times daily.   Yes [provider]  nitroGLYCERIN (NITROSTAT)  0.4 MG SL tablet Place 1 tablet (0.4 mg total) under the tongue every 5 (five) minutes as needed for chest pain (Max 3 doses). 10/22/17  Yes Vann, Jessica U, DO  oxybutynin (DITROPAN) 5 MG tablet Take 5 mg by mouth daily.   Yes [provider]  polyethylene glycol (MIRALAX / GLYCOLAX) packet Take 17 g by mouth daily as needed for mild constipation. 10/22/17  Yes Vann, Jessica U, DO  senna-docusate (SENOKOT-S) 8.6-50 MG tablet Take 1 tablet by mouth 2 (two) times daily.   Yes [provider]  traZODone (DESYREL) 50 MG tablet Take 50 mg by mouth at bedtime.   Yes [provider]  Past Surgical History Past Surgical History:  Procedure Laterality Date  . ABDOMINAL HYSTERECTOMY    . bilateral arthroscopic knee surgery    . carpel tunnel wrist rt    . CHOLECYSTECTOMY    . CORONARY ANGIOPLASTY     stent  . NASAL SINUS SURGERY     x2  . stent surgery     Family History Family History  Problem Relation Age of Onset  . Heart attack Father   . Breast cancer Sister   . Heart disease Brother   . Heart attack Son   . Heart attack Daughter   . Colon cancer Neg Hx   . Stroke Neg Hx     Social History Social History   Tobacco Use  . Smoking status: Never Smoker  . Smokeless tobacco: Never Used  Substance Use Topics  . Alcohol use: No  . Drug use: No   Allergies Amoxicillin  Review of Systems Review of Systems  Constitutional: Negative for fever.  Respiratory: Negative for cough, sputum production and shortness of breath.   Cardiovascular: Positive for chest pain.  Gastrointestinal: Negative for nausea and vomiting.  Neurological: Negative for headaches.   All other systems are reviewed and are negative for acute change except as noted in the HPI  Physical Exam Vital Signs  I have reviewed the triage vital signs BP 127/67    Pulse 89   Temp 97.7 F (36.5 C) (Oral)   Ht 5\' 3"  (1.6 m)   Wt 72.6 kg   SpO2 98%   BMI 28.34 kg/m   Physical Exam  Constitutional: She is oriented to person, place, and time. She appears well-developed and well-nourished. No distress.  HENT:  Head: Normocephalic and atraumatic.  Nose: Nose normal.  Eyes: Pupils are equal, round, and reactive to light. Conjunctivae and EOM are normal. Right eye exhibits no discharge. Left eye exhibits no discharge. No scleral icterus.  Neck: Normal range of motion. Neck supple.  Cardiovascular: Normal rate and regular rhythm. Exam reveals no gallop and no friction rub.  No murmur heard. Pulmonary/Chest: Effort normal. No stridor. No respiratory distress. She has rales in the left middle field and the left lower field.  Abdominal: Soft. She exhibits no distension. There is no tenderness.  Musculoskeletal: She exhibits no edema.  BLE pitting edema  Neurological: She is alert and oriented to person, place, and time.  Skin: Skin is warm and dry. No rash noted. She is not diaphoretic. No erythema.  Psychiatric: She has a normal mood and affect.  Vitals reviewed.   ED Results and Treatments Labs (all labs ordered are listed, but only abnormal results are displayed) Labs Reviewed  BASIC METABOLIC PANEL - Abnormal; Notable for the following components:      Result Value   Glucose, Bld 109 (*)    Calcium 8.8 (*)    GFR calc non Af Amer 56 (*)    All other components within normal limits  CBC - Abnormal; Notable for the following components:   RBC 3.58 (*)    Hemoglobin 10.7 (*)    HCT 34.0 (*)    All other components within normal limits  BRAIN NATRIURETIC PEPTIDE - Abnormal; Notable for the following components:   B Natriuretic Peptide 204.7 (*)    All other components within normal limits  I-STAT TROPONIN, ED  I-STAT TROPONIN, ED  EKG  EKG Interpretation  Date/Time:  Thursday March 20 2018 04:50:41 EDT Ventricular Rate:  81 PR Interval:    QRS Duration: 128 QT Interval:  415 QTC Calculation: 482 R Axis:   69 Text Interpretation:  Atrial fibrillation Right bundle branch block No significant change since last tracing Confirmed by Addison Lank [phone]) on 03/20/2018 5:06:25 AM      Radiology Dg Chest 2 View  Result Date: 03/20/2018 CLINICAL DATA:  83 year old female with chest pain and shortness of breath. EXAM: CHEST - 2 VIEW COMPARISON:  Chest radiograph dated 01/15/2018 FINDINGS: Probable small left pleural effusion with minimal left lung base atelectasis. Infiltrate is not excluded. Clinical correlation is recommended. The right lung is clear. Linear lucency along the peripheral aspect of the inferior right lung field appears artifactual. No definite pneumothorax. There is mild cardiomegaly and coronary vascular calcification. There is atherosclerotic calcification of the aorta. No acute osseous pathology. IMPRESSION: 1. Probable small left pleural effusion and left lung base atelectasis/infiltrate. 2. Mild cardiomegaly. Electronically Signed   By: Anner Crete M.D.   On: 03/20/2018 05:41   Pertinent labs & imaging results that were available during my care of the patient were reviewed by me and considered in my medical decision making (see chart for details).  Medications Ordered in ED Medications  acetaminophen (TYLENOL) tablet 1,000 mg (1,000 mg Oral Given 03/20/18 0521)                                                                                                                                    Procedures Procedures  (including critical care time)  Medical Decision Making / ED Course I have reviewed the nursing notes for this encounter and the patient's prior records (if available in EHR or on provided paperwork).    Left chest pain with radiation to the arm and neck.  EKG with atrial  fibrillation and right bundle branch block similar to prior.  No acute ischemic changes or evidence of pericarditis.  Initial troponin negative.  On exam patient has peripheral edema.  Lungs with left rales. CXR with left pleural effusion with opacity suspicious for atelectasis vs infiltrate. Patient denied any recent infectious symptoms.  Possibly related to volume overload given peripheral edema and orthopnea.  Given pain resolution with NTG, she will need admission for ACS rule out and further assessment of pleural effusion.  Doubt PE, not classic for dissection.  Will discuss with medicine for admission and further work up.    Final Clinical Impression(s) / ED Diagnoses Final diagnoses:  Chest pain at rest  Pleural effusion on left      This chart was dictated using voice recognition software.  Despite best efforts to proofread,  errors can occur which can change the documentation meaning.   Fatima Blank, MD 03/20/18 [phone]

## 2018-03-20 NOTE — H&P (Signed)
Date: 03/20/2018               Patient Name:  [name] MRN: [id number]  DOB: April 28, 1924 Age / Sex: 84 y.o., female   PCP: Chesley Noon, MD         Medical Service: Internal Medicine Teaching Service         Attending Physician: Dr. Oval Linsey, MD    First Contact: Dr. Laural Golden Pager: [phone]  Second Contact: Dr. Tarri Abernethy Pager: [phone]       After Hours (After 5p/  First Contact Pager: [phone]  weekends / holidays): Second Contact Pager: [phone]   Chief Complaint: chest pain  History of Present Illness:  [name] is a 84yo female with PMH of CAD s/p stent, HTN, a fib on chronic anticoagulation, dementia presenting to [facility] from her [facility] for evaluation of chest pain.  Patient states that she was opening up a sudden onset of left-sided chest pain that was sharp in nature and radiated to the side of her left neck, jaw, and left shoulder.  She denies associated diaphoresis, dizziness, shortness of breath, nausea, vomiting, syncope.  She states she has never had this type of pain before; her previous CAD pain was much different and more of a diffuse pressure.  She states that the pain lasted for quite a long time but is unable to specify for how long, currently it is resolved.  Via EMS she received 324 mg of aspirin and 4 nitroglycerin tabs.  She said that she did have a headache earlier that is now resolved with the Tylenol she received in the ED.  She is unable to stay when her headache began. She denies any recent falls, increased activity.  Patient also endorses 2 days of right lower leg pain and knee pain.  The knee pain is relieved with a cream that staff at [facility] have been applying.  She endorses swelling of the right lower leg.  She denies fevers, rashes, cough, hemoptysis, orthopnea.  Furthermore patient endorses chronic constipation, without melena or hematochezia.  She denies urinary symptoms or abdominal pain currently; denies dysphagia,  odynophagia, dysarthria, focal weakness or numbness.  She states that she has not had any medication changes recently.  Meds:  Current Meds  Medication Sig  . acetaminophen (TYLENOL) 500 MG tablet Take 1,000 mg by mouth 3 (three) times daily.  Marland Kitchen apixaban (ELIQUIS) 5 MG TABS tablet Take 1 tablet (5 mg total) by mouth 2 (two) times daily.  Marland Kitchen atorvastatin (LIPITOR) 20 MG tablet Take 20 mg by mouth daily at 6 PM.   . Calcium Carbonate-Vitamin D (CALCIUM 500/D PO) Take 1 tablet by mouth 2 (two) times daily.   . diclofenac sodium (VOLTAREN) 1 % GEL Apply 2 g topically 4 (four) times daily.  . divalproex (DEPAKOTE) 125 MG DR tablet Take 125 mg by mouth 2 (two) times daily.  Marland Kitchen donepezil (ARICEPT) 10 MG tablet Take 10 mg by mouth at bedtime.  . gabapentin (NEURONTIN) 600 MG tablet Take 300-600 mg by mouth 2 (two) times daily. Take 300 mg in the morning and 600 mg in the evening  . guaiFENesin 200 MG tablet Take 200 mg by mouth every 6 (six) hours.  Marland Kitchen latanoprost (XALATAN) 0.005 % ophthalmic solution INSTILL 1 DROP INTO BOTH EYES AT [place] FOR GLAUCOMA  . linagliptin (TRADJENTA) 5 MG TABS tablet Take 1 tablet (5 mg total) by mouth daily.  . Liniments (SALONPAS EX) Apply 1 patch topically  daily.  . lisinopril (PRINIVIL,ZESTRIL) 10 MG tablet Take 10 mg by mouth daily.  Marland Kitchen loratadine (CLARITIN) 10 MG tablet Take 10 mg by mouth daily.  Marland Kitchen LUMIGAN 0.01 % SOLN Place 1 drop into both eyes at bedtime.  . Melatonin 10 MG TABS Take 10 mg by mouth at bedtime.  . Multiple Vitamins-Minerals (MULTIVITAMIN PO) Take 1 tablet by mouth daily.  . Multiple Vitamins-Minerals (PRESERVISION AREDS PO) Take 1 capsule by mouth 2 (two) times daily.  . nitroGLYCERIN (NITROSTAT) 0.4 MG SL tablet Place 1 tablet (0.4 mg total) under the tongue every 5 (five) minutes as needed for chest pain (Max 3 doses).  Marland Kitchen oxybutynin (DITROPAN) 5 MG tablet Take 5 mg by mouth daily.  . polyethylene glycol (MIRALAX / GLYCOLAX) packet Take 17 g by  mouth daily as needed for mild constipation.  . senna-docusate (SENOKOT-S) 8.6-50 MG tablet Take 1 tablet by mouth 2 (two) times daily.  . traZODone (DESYREL) 50 MG tablet Take 50 mg by mouth at bedtime.   Allergies: Allergies as of 03/20/2018 - Review Complete 03/20/2018  Allergen Reaction Noted  . Amoxicillin Other (See Comments) 12/03/2007   Past Medical History:  Diagnosis Date  . 1st degree AV block   . Acute renal failure ([place])    due to dehydration-Sept 2009; Normal creat 1.2 on fu 05/13/2009  . Allergic rhinitis   . Anemia    NOS chronic disease. Hgb 11.6gm% 04/14/2009  . Bradycardia   . CAD (coronary artery disease)    a. s/p DES mLAD 2005. b. Cutting balloon angioplasty for ISR 2006. c. LHC 08/2010 nonobstructive. d. Myoview 06/2012: small anteroapical infarct/mod inferolat wall infarct but no ischemia, EF 75%.  . Cancer ([place])    on nose and had it removed  . Chronic anticoagulation   . CKD (chronic kidney disease), stage III ([place])   . Dementia    slight  . Diabetes mellitus type II   . Diverticulitis of colon   . DM neuropathy, type II diabetes mellitus ([place])   . GERD (gastroesophageal reflux disease)   . HTN (hypertension)   . Hx of colonoscopy   . Hyperlipidemia   . IBS (irritable bowel syndrome)   . Multinodular goiter   . Obesity    BMI-37  . Orthostatic hypotension   . Osteoarthritis    lower back  . Osteoporosis   . Pneumonia   . PVC's (premature ventricular contractions)    a. seen on telemetry 02/2016.  Marland Kitchen RBBB   . Rheumatoid arthritis ([place])   . Spinal stenosis    djd lumbar    Family History: Significant for coronary artery disease with some family members passing away in her 59s and 54s.  Social History: The patient lives at [facility]; she denies current or prior history of tobacco use, alcohol use, or illicit drug use.  Per chart review her granddaughter is her healthcare power of attorney.  Review of Systems: A complete ROS was negative  except as per HPI.   Physical Exam: Blood pressure (!) 132/56, pulse 62, temperature 97.7 F (36.5 C), temperature source Oral, resp. rate 19, height 5\' 3"  (1.6 m), weight 72.6 kg, SpO2 100 %. GENERAL- alert, co-operative, appears as stated age, not in any distress. HEENT- Atraumatic, normocephalic, PERRL, EOMI, oral mucosa appears moist CARDIAC- irregularly irregular rhythm, 2/ holosystolic murmur, no rubs or gallops. RESP- Moving equal volumes of air, and clear to auscultation bilaterally, no wheezes or crackles. ABDOMEN- Soft, mild suprapubic tenderness, bowel sounds present.  NEURO- CN 2-12 intact, strength and sensation intact throught; A&Ox3. EXTREMITIES- pulse 2+ PT/DP, symmetric; mild nonpitting edema on left LE; moderate nonpitting edema on RLE with tenderness to palpation but w/o erythema, increased warmth, rash; no TTP or effusion noted on R knee; however on re-examination legs are equal with trace edema. ?baker's cyst on right. TTP of anterior head of left humerous which represents the pain she had earlier; no TTP of cervical spine, neck, or anterior chest. SKIN- Warm, dry, no rash or lesion. PSYCH- Normal mood and affect, appropriate thought content and speech.  EKG: personally reviewed my interpretation is atrial fibrillation, RBBB  CXR: personally reviewed my interpretation is poor inspiratory effort, arthritic changes to bil shoulders; increased diffuse opacification of lower lung fields bilaterally likely representing atelectasis  Assessment & Plan by Problem: Active Problems:   Chest pain  Likely noncardiac chest pain: Patient with sudden onset, sharp left sided chest pain with radiation to left neck and shoulder with nonischemic EKG and initial troponin; pain relieved after being given nitro and asa 324mg . She does have CAD history, and significant risk factors for ACS. On exam she has significant tenderness to palpation of her left anterior shoulder which she states is the  same type of pain she had earlier, however no reproducible pain at the other sites she mentioned before. Her HR is controlled; she is presumably taking her eliquis for anticoagulation so VTE less likely and w/u would not change management; no infectious signs or symptoms to think PNA: and exam not consistent with volume overload.  --trend troponin --continue medical management of CAD h/o with atorvastatin, lisinopril; may be able to add low dose BB  RLE swelling: Patient with 2d h/o of right knee pain and RLE swelling; on exam right LE is larger in diameter than her left, but is w/o ecchymoses, erythema, increased warmth, rash. Pulses intact; no evidence of knee effusion. Low likelihood of DVT if she has been on eliquis. Prior doppler studies a few years ago noted bil Baker's cysts; current presentation could be related to enlarging Baker's cyst. There does not seem to be evidence of ruptured cysts, compressive thrombosis, or nerve entrapment as a complication of Baker's cyst. --voltaren gel for knee pain --could consider POC ultrasound to evaluate for knee effusion and cyst better  A fib: Patient currently in afib, rate controlled w/o medications.  --continue eliquis for anticoagulation  HTN: Normotensive in ED. --continue lisinopril  Suprapubic tenderness: Mild tenderness on exam and patient denies s/sx of UTI. She does have incontinence and is on oxybutynin. --measure PVR to assess for retention --continue oxybutynin  T2DM: At home on tradjenta. --continue home meds; SSI-S  Diet: CM IVF: none VTE ppx: eliquis Code: patient's wishes consistent with DNR, she was able to communicate what this meant.  Dispo: Admit patient to Observation with expected length of stay less than 2 midnights. SW consulted to help arrange transfer back to facility once she is stable for discharge.  Signed: Alphonzo Grieve, MD 03/20/2018, 8:00 AM  [facility] - PGY3 Pager [phone]

## 2018-03-20 NOTE — ED Triage Notes (Signed)
Pt BIB [facility] from [facility]. Called out for chest pain that woke patient from sleep. Pt received 4 nitro and 324 ASA. Pain was an 8/10 now is a 2/10.

## 2018-03-21 DIAGNOSIS — M25512 Pain in left shoulder: Secondary | ICD-10-CM

## 2018-03-21 DIAGNOSIS — R011 Cardiac murmur, unspecified: Secondary | ICD-10-CM

## 2018-03-21 DIAGNOSIS — I10 Essential (primary) hypertension: Secondary | ICD-10-CM

## 2018-03-21 DIAGNOSIS — Z7901 Long term (current) use of anticoagulants: Secondary | ICD-10-CM

## 2018-03-21 DIAGNOSIS — Z955 Presence of coronary angioplasty implant and graft: Secondary | ICD-10-CM

## 2018-03-21 DIAGNOSIS — I4891 Unspecified atrial fibrillation: Secondary | ICD-10-CM

## 2018-03-21 DIAGNOSIS — I2510 Atherosclerotic heart disease of native coronary artery without angina pectoris: Secondary | ICD-10-CM

## 2018-03-21 DIAGNOSIS — Z881 Allergy status to other antibiotic agents status: Secondary | ICD-10-CM

## 2018-03-21 DIAGNOSIS — R0789 Other chest pain: Secondary | ICD-10-CM | POA: Diagnosis not present

## 2018-03-21 DIAGNOSIS — I4510 Unspecified right bundle-branch block: Secondary | ICD-10-CM

## 2018-03-21 LAB — GLUCOSE, CAPILLARY
GLUCOSE-CAPILLARY: 122 mg/dL — AB (ref 70–99)
Glucose-Capillary: 93 mg/dL (ref 70–99)

## 2018-03-21 NOTE — Progress Notes (Signed)
[facility] here to transport patient. She denies CP and has all belongings. Report given to [facility] at [facility]. She left via stretcher w [facility].

## 2018-03-21 NOTE — Discharge Instructions (Addendum)
Thank you for allowing [place] to take care of you during your admission to [facility].   Chest Wall Pain Chest wall pain is pain in or around the bones and muscles of your chest. Sometimes, an injury causes this pain. Sometimes, the cause may not be known. This pain may take several weeks or longer to get better. Follow these instructions at home: Pay attention to any changes in your symptoms. Take these actions to help with your pain:  Rest as told by your doctor.  Avoid activities that cause pain. Try not to use your chest, belly (abdominal), or side muscles to lift heavy things.  If directed, apply ice to the painful area: ? Put ice in a plastic bag. ? Place a towel between your skin and the bag. ? Leave the ice on for 20 minutes, 2-3 times per day.  Take over-the-counter and prescription medicines only as told by your doctor.  Do not use tobacco products, including cigarettes, chewing tobacco, and e-cigarettes. If you need help quitting, ask your doctor.  Keep all follow-up visits as told by your doctor. This is important.  Contact a doctor if:  You have a fever.  Your chest pain gets worse.  You have new symptoms. Get help right away if:  You feel sick to your stomach (nauseous) or you throw up (vomit).  You feel sweaty or light-headed.  You have a cough with phlegm (sputum) or you cough up blood.  You are short of breath. This information is not intended to replace advice given to you by your health care provider. Make sure you discuss any questions you have with your health care provider. Document Released: 12/26/2007 Document Revised: 12/15/2015 Document Reviewed: 10/04/2014 Elsevier Interactive Patient Education  2018 [place] on my medicine - ELIQUIS (apixaban)  This medication education was reviewed with me or my healthcare representative as part of my discharge preparation.  The pharmacist that spoke with me during my hospital stay was:   Einar Grad, [facility]  Why was Eliquis prescribed for you? Eliquis was prescribed for you to reduce the risk of a blood clot forming that can cause a stroke if you have a medical condition called atrial fibrillation (a type of irregular heartbeat).  What do You need to know about Eliquis ? Take your Eliquis TWICE DAILY - one tablet in the morning and one tablet in the evening with or without food. If you have difficulty swallowing the tablet whole please discuss with your pharmacist how to take the medication safely.  Take Eliquis exactly as prescribed by your doctor and DO NOT stop taking Eliquis without talking to the doctor who prescribed the medication.  Stopping may increase your risk of developing a stroke.  Refill your prescription before you run out.  After discharge, you should have regular check-up appointments with your healthcare provider that is prescribing your Eliquis.  In the future your dose may need to be changed if your kidney function or weight changes by a significant amount or as you get older.  What do you do if you miss a dose? If you miss a dose, take it as soon as you remember on the same day and resume taking twice daily.  Do not take more than one dose of ELIQUIS at the same time to make up a missed dose.  Important Safety Information A possible side effect of Eliquis is bleeding. You should call your healthcare provider right away if you experience any of  the following: ? Bleeding from an injury or your nose that does not stop. ? Unusual colored urine (red or dark brown) or unusual colored stools (red or black). ? Unusual bruising for unknown reasons. ? A serious fall or if you hit your head (even if there is no bleeding).  Some medicines may interact with Eliquis and might increase your risk of bleeding or clotting while on Eliquis. To help avoid this, consult your healthcare provider or pharmacist prior to using any new prescription or non-prescription  medications, including herbals, vitamins, non-steroidal anti-inflammatory drugs (NSAIDs) and supplements.  This website has more information on Eliquis (apixaban): http://www.eliquis.com/eliquis/home

## 2018-03-21 NOTE — Plan of Care (Signed)
  Problem: Education: Goal: Knowledge of General Education information will improve Description Including pain rating scale, medication(s)/side effects and non-pharmacologic comfort measures 03/21/2018 1600 by Armstrong Creasy, Lydia Guiles, RN Outcome: Adequate for Discharge 03/21/2018 1600 by Arnetia Bronk, Lydia Guiles, RN Outcome: Adequate for Discharge   Problem: Health Behavior/Discharge Planning: Goal: Ability to manage health-related needs will improve 03/21/2018 1600 by Ambre Kobayashi, Lydia Guiles, RN Outcome: Adequate for Discharge 03/21/2018 1600 by Brynnly Bonet, Lydia Guiles, RN Outcome: Adequate for Discharge   Problem: Clinical Measurements: Goal: Ability to maintain clinical measurements within normal limits will improve 03/21/2018 1600 by Ambriella Kitt, Lydia Guiles, RN Outcome: Adequate for Discharge 03/21/2018 1600 by Kelyn Ponciano, Lydia Guiles, RN Outcome: Adequate for Discharge Goal: Will remain free from infection 03/21/2018 1600 by Juanantonio Stolar, Lydia Guiles, RN Outcome: Adequate for Discharge 03/21/2018 1600 by Satoru Milich, Lydia Guiles, RN Outcome: Adequate for Discharge Goal: Diagnostic test results will improve 03/21/2018 1600 by Shirleen Mcfaul, Lydia Guiles, RN Outcome: Adequate for Discharge 03/21/2018 1600 by Makenley Shimp, Lydia Guiles, RN Outcome: Adequate for Discharge Goal: Respiratory complications will improve 03/21/2018 1600 by Jalaysia Lobb, Lydia Guiles, RN Outcome: Adequate for Discharge 03/21/2018 1600 by Hogan Hoobler, Lydia Guiles, RN Outcome: Adequate for Discharge Goal: Cardiovascular complication will be avoided 03/21/2018 1600 by Illona Bulman, Lydia Guiles, RN Outcome: Adequate for Discharge 03/21/2018 1600 by Joss Friedel, Lydia Guiles, RN Outcome: Adequate for Discharge   Problem: Activity: Goal: Risk for activity intolerance will decrease 03/21/2018 1600 by Bethany Hirt, Lydia Guiles, RN Outcome: Adequate for Discharge 03/21/2018 1600 by Ashyra Cantin, Lydia Guiles, RN Outcome: Adequate for Discharge   Problem: Nutrition: Goal: Adequate  nutrition will be maintained 03/21/2018 1600 by Emitt Maglione, Lydia Guiles, RN Outcome: Adequate for Discharge 03/21/2018 1600 by Windel Keziah, Lydia Guiles, RN Outcome: Adequate for Discharge   Problem: Coping: Goal: Level of anxiety will decrease 03/21/2018 1600 by Conor Filsaime, Lydia Guiles, RN Outcome: Adequate for Discharge 03/21/2018 1600 by Allizon Woznick, Lydia Guiles, RN Outcome: Adequate for Discharge   Problem: Elimination: Goal: Will not experience complications related to bowel motility 03/21/2018 1600 by Melquisedec Journey, Lydia Guiles, RN Outcome: Adequate for Discharge 03/21/2018 1600 by Areya Lemmerman, Lydia Guiles, RN Outcome: Adequate for Discharge Goal: Will not experience complications related to urinary retention 03/21/2018 1600 by Marletta Bousquet, Lydia Guiles, RN Outcome: Adequate for Discharge 03/21/2018 1600 by Kailash Hinze, Lydia Guiles, RN Outcome: Adequate for Discharge   Problem: Pain Managment: Goal: General experience of comfort will improve 03/21/2018 1600 by Remi Rester, Lydia Guiles, RN Outcome: Adequate for Discharge 03/21/2018 1600 by Trang Bouse, Lydia Guiles, RN Outcome: Adequate for Discharge   Problem: Safety: Goal: Ability to remain free from injury will improve 03/21/2018 1600 by Jauna Raczynski, Lydia Guiles, RN Outcome: Adequate for Discharge 03/21/2018 1600 by Lacey Wallman, Lydia Guiles, RN Outcome: Adequate for Discharge   Problem: Skin Integrity: Goal: Risk for impaired skin integrity will decrease 03/21/2018 1600 by Notnamed Croucher, Lydia Guiles, RN Outcome: Adequate for Discharge 03/21/2018 1600 by Laure Leone, Lydia Guiles, RN Outcome: Adequate for Discharge

## 2018-03-21 NOTE — Progress Notes (Signed)
   Subjective: [name] is a 84 y.o caucasian female with a PMHx of A Fib & CAD s/p stent presenting with acute atypical L sided chest pain radiating to her L neck and arm and was admitted yesterday for an ACS r/o. Overnight, she sustained a 6 beat run of VTach but was asymptomatic. She currently has had a negative cardiac w/u with negative troponins and nonischemic EKG's and due to our speculation of her atypical chest pain being non cardiac in nature, the plan is to d/c her today to her nursing home. We discussed with the patient for the discharge plans and she is agreeable with the plans.  Objective:  Vital signs in last 24 hours: Vitals:   03/20/18 1910 03/20/18 1948 03/20/18 2020 03/21/18 0330  BP: (!) 163/70  (!) 162/74 (!) 145/66  Pulse:  81  91  Resp: (!) 29 (!) 22 (!) 26   Temp:  98.7 F (37.1 C)  98.8 F (37.1 C)  TempSrc:  Oral  Oral  SpO2: 95% 100% 95%   Weight:      Height:       Physical Exam  Constitutional: She is oriented to person, place, and time and well-developed, well-nourished, and in no distress.  HENT:  Head: Normocephalic and atraumatic.  Eyes: Pupils are equal, round, and reactive to light. EOM are normal.  Neck: Normal range of motion. Neck supple.  Cardiovascular: Normal rate and intact distal pulses. Exam reveals no gallop and no friction rub.  Irregularly irregular rhythm  Pulmonary/Chest: Effort normal. No respiratory distress.  Rales in the bilateral lower lung fields posteriorly  Abdominal: Soft. Bowel sounds are normal. She exhibits no distension. There is no tenderness. There is no rebound and no guarding.  Musculoskeletal: Normal range of motion. She exhibits edema.  Mildly to the bilateral lower extremity  Neurological: She is alert and oriented to person, place, and time.  Skin: Skin is warm and dry.    Assessment/Plan:  This is a 84 y.o caucasian female with PMHx of CAD s/p stent being admitted for an ACS r/o who is clinically  improved.  Active Problems:   Chest pain  Atypical Chest Pain, likely MSK in nature. Benign cardiac w/u so far with negative Troponins x3 and nonischemic EKG. We believe the angina is non cardiac in nature and secondary to her left shoulder musculoskeletal tenderness. Since pt is clinically improved with the optimized ACS meds of Lisinopril, Lipitor, will discharge today. Social worker on board to set up transfer back to [facility].  Dispo: Anticipated discharge today   Trixie Rude, Medical Student 03/21/2018, 6:02 AM Pager: [phone]

## 2018-03-21 NOTE — H&P (Signed)
Internal Medicine Attending Admission Note Date: 03/21/2018  Patient name: [name] Medical record number: [id number] Date of birth: September 17, 1923 Age: 84 y.o. Gender: female  I saw and evaluated the patient. I reviewed the resident's note and I agree with the resident's findings and plan as documented in the resident's note.  Chief Complaint(s): Sudden onset left chest and shoulder pain.  History - key components related to admission:  [name] is a 84 year old woman with a history of atrial fibrillation on chronic Eliquis, hypertension, coronary artery disease status post percutaneous coronary intervention, and reportedly dementia who was in her usual state of health until the morning of admission when she was awoken by sudden left chest and shoulder pain. She is a resident of [facility]. On the morning of admission she had the sudden onset of left chest pain and shoulder pain which was characterized as sharp and radiating to the left neck and jaw. There was no shortness of breath, nausea, dizziness, or diaphoresis. This pain was different than her previous coronary pain. She was unable to tell us how long the pain lasted but it apparently resolved in the ambulance after four nitroglycerin tablets.  She denied any recent chest trauma, falls, increased activity including lifting, fevers, shakes, chills, or productive cough. She was admitted to the internal medicine teaching service for further evaluation and care.  When seen on rounds the morning following admission she was without any further chest pain and had no other acute complaints.  Physical Exam - key components related to admission:  Vitals:   03/20/18 2020 03/21/18 0330 03/21/18 0855 03/21/18 0935  BP: (!) 162/74 (!) 145/66 (!) 161/79 (!) 152/67  Pulse:  91  84  Resp: (!) 26   (!) 24  Temp:  98.8 F (37.1 C) 97.8 F (36.6 C) 98 F (36.7 C)  TempSrc:  Oral Oral Oral  SpO2: 95% 99% 97% 98%  Weight:      Height:        Gen.: Well-developed, well-nourished, woman sitting up in bed eating breakfast in no acute distress. Lungs: Clear to auscultation bilaterally without wheezes, rhonchi, or rales. Chest wall: Nontender to palpation in the left chest and left shoulder. Heart: Irregularly irregular with a low pitched 2/6 systolic murmur best heard at the apex. Abdomen: Soft, nontender, without guarding or rebound. Extremities: 1+ pitting edema bilaterally.  Lab results:  Basic Metabolic Panel: Recent Labs    03/20/18 0442 03/20/18 2015  NA 141  --   K 4.4  --   CL 104  --   CO2 30  --   GLUCOSE 109*  --   BUN 12  --   CREATININE 0.87  --   CALCIUM 8.8*  --   MG  --  1.7   CBC: Recent Labs    03/20/18 0442  WBC 5.2  HGB 10.7*  HCT 34.0*  MCV 95.0  PLT 167   Cardiac Enzymes: Recent Labs    03/20/18 0812 03/20/18 1309 03/20/18 1944  TROPONINI <0.03 <0.03 <0.03   CBG: Recent Labs    03/20/18 1207 03/20/18 1648 03/20/18 2101 03/21/18 0559 03/21/18 1112  GLUCAP 109* 122* 106* 93 122*   Misc. Labs:  BNP 204.7  Imaging results:   Chest x-ray: Personally reviewed. Left basilar atelectasis.  Other results:  EKG: Personally reviewed. Atrial fibrillation at 81 bpm, normal axis, right bundle branch block, no significant Q waves, no LVH by voltage, no significant no ST segment changes, T wave inversions in  a strain pattern in V1-V3. No significant changes from the previous ECG on 01/15/2018.  Assessment & Plan by Problem:  [name] is a 84 year old woman with a history of atrial fibrillation on chronic Eliquis, hypertension, coronary artery disease status post percutaneous coronary intervention, and reportedly dementia who was in her usual state of health until the morning of admission when she was awoken by sudden left chest and shoulder pain.  Despite her previous history of coronary artery disease the pain she describes is very atypical.  She has ruled out for myocardial  infarction with serial enzymes. Given the low likelihood that this pain represents cardiac pain I do not believe she requires an adjustment in her antianginal regimen. In addition, in the past, she apparently had significant conduction abnormalities exacerbated by beta blockade, thus we would like to avoid any antianginals that would be nodal acting. Fortunately, we do not feel at this time antianginal therapy is required. She has some left basilar atelectasis which could represent a process that is referred to the left shoulder with regards to pain. She has no signs or symptoms of infection whatsoever and presumably has been provided her Eliquis consistently at the nursing home making a venous thromboembolism less likely. My suspicion is that he atelectasis is secondary to poor inspiratory result on chest x-ray and likely decreased activity, but does not represent an acute process. That said, if the pain recurs or she develops symptoms consistent of an infection one would need to reconsider a clinically relevant process in the left base of the lung.  1) Atypical chest pain: Ruled out for myocardial infarction with serial enzymes. No further evaluation is necessary at this time.  2) Disposition: She will be transferred back to [facility] for continued long-term care.

## 2018-03-21 NOTE — Discharge Summary (Addendum)
Name: [name] MRN: [id number] DOB: [birth year] 84 y.o. PCP: Chesley Noon, MD  Date of Admission: 03/20/2018  4:34 AM Date of Discharge:  Attending Physician: Oval Linsey, MD  Discharge Diagnosis: 1. Atypical Chest Pain likely musculoskeletal in nature, resolved  Discharge Medications: Allergies as of 03/21/2018      Reactions   Amoxicillin Other (See Comments)   UNKNOWN      Medication List    TAKE these medications   acetaminophen 500 MG tablet Commonly known as:  TYLENOL Take 1,000 mg by mouth 3 (three) times daily.   apixaban 5 MG Tabs tablet Commonly known as:  ELIQUIS Take 1 tablet (5 mg total) by mouth 2 (two) times daily.   atorvastatin 20 MG tablet Commonly known as:  LIPITOR Take 20 mg by mouth daily at 6 PM.   CALCIUM 500/D PO Take 1 tablet by mouth 2 (two) times daily.   diclofenac sodium 1 % Gel Commonly known as:  VOLTAREN Apply 2 g topically 4 (four) times daily.   divalproex 125 MG DR tablet Commonly known as:  DEPAKOTE Take 125 mg by mouth 2 (two) times daily.   donepezil 10 MG tablet Commonly known as:  ARICEPT Take 10 mg by mouth at bedtime.   gabapentin 600 MG tablet Commonly known as:  NEURONTIN Take 300-600 mg by mouth 2 (two) times daily. Take 300 mg in the morning and 600 mg in the evening   guaiFENesin 200 MG tablet Take 200 mg by mouth every 6 (six) hours.   latanoprost 0.005 % ophthalmic solution Commonly known as:  XALATAN INSTILL 1 DROP INTO BOTH EYES AT BEDTINE FOR GLAUCOMA   linagliptin 5 MG Tabs tablet Commonly known as:  TRADJENTA Take 1 tablet (5 mg total) by mouth daily.   lisinopril 10 MG tablet Commonly known as:  PRINIVIL,ZESTRIL Take 10 mg by mouth daily.   loratadine 10 MG tablet Commonly known as:  CLARITIN Take 10 mg by mouth daily.   LUMIGAN 0.01 % Soln Generic drug:  bimatoprost Place 1 drop into both eyes at bedtime.   Melatonin 10 MG Tabs Take 10 mg by mouth at bedtime.     MULTIVITAMIN PO Take 1 tablet by mouth daily.   PRESERVISION AREDS PO Take 1 capsule by mouth 2 (two) times daily.   nitroGLYCERIN 0.4 MG SL tablet Commonly known as:  NITROSTAT Place 1 tablet (0.4 mg total) under the tongue every 5 (five) minutes as needed for chest pain (Max 3 doses).   oxybutynin 5 MG tablet Commonly known as:  DITROPAN Take 5 mg by mouth daily.   polyethylene glycol packet Commonly known as:  MIRALAX / GLYCOLAX Take 17 g by mouth daily as needed for mild constipation.   SALONPAS EX Apply 1 patch topically daily.   senna-docusate 8.6-50 MG tablet Commonly known as:  Senokot-S Take 1 tablet by mouth 2 (two) times daily.   traZODone 50 MG tablet Commonly known as:  DESYREL Take 50 mg by mouth at bedtime.      Disposition and follow-up:   Ms.[name] was discharged from [facility] in stable condition.  At the hospital follow up visit please address:  1.  Atypical Chest Pain. Please continue all other medication as prescribed.   2.  Labs / imaging needed at time of follow-up: none  3.  Pending labs/ test needing follow-up: none  Follow-up Appointments: Follow-up Information    Chesley Noon, MD. Schedule an appointment  as soon as possible for a visit.   Specialty:  Family Medicine Contact information: [place] [address] [place] Hospital Course by problem list: 1. Atypical Chest Pain likely musculoskeletal. Mrs. Batte is a 84 y.o caucasian female with a PMHx of A-Fib and CAD s/p stent who initially presented with atypical left sided chest pain for an ACS r/o on Thursday March 20 2018. An EKG revealed rate controlled A Fib with RBBB, which is stable compared to prior. She had negative troponin x3. ACS was subsequently ruled out. On PE she did have point tenderness to palpation of the chest wall that reproduced the exact type of chest pain. Therefore, her chest pain is likely  of musculoskeletal etiology secondary to her L shoulder tenderness so we will not proceed with a further cardiac w/u with an outpatient stress test. We will d/c her back to her [facility] home [facility]) and she is agreeable with this plan.  Discharge Vitals:   BP (!) 152/67 (BP Location: Left Arm)   Pulse 84   Temp 98 F (36.7 C) (Oral)   Resp (!) 24   Ht 5\' 3"  (1.6 m)   Wt 72.6 kg   SpO2 98%   BMI 28.34 kg/m   Pertinent Labs, Studies, and Procedures:  Troponin x3 negative  Discharge Instructions: Discharge Instructions    Diet - low sodium heart healthy   Complete by:  As directed    Increase activity slowly   Complete by:  As directed      Signed: Ina Homes, MD IMTS, PGY2

## 2018-03-21 NOTE — Progress Notes (Signed)
Internal Medicine Attending  Date: 03/21/2018  Patient name: [name] Medical record number: [id number] Date of birth: [birth year] Age: 84 y.o. Gender: female  I saw and evaluated the patient. I reviewed the medical student's/resident's note by Mr. Arbie Cookey. Tarri Abernethy and I agree with the medical student's/resident's findings and plans as documented in their progress note.  Please see my H&P dated 03/21/2018 for the specifics of my evaluation, assessment, and plan from earlier in the day.

## 2018-03-21 NOTE — Clinical Social Work Note (Signed)
Clinical Social Work Assessment  Patient Details  Name: [name] MRN: [id number] Date of Birth: October 23, 1923  Date of referral:  03/21/18               Reason for consult:  Discharge Planning, Facility Placement                Permission sought to share information with:  Family Supports Permission granted to share information::  Yes, Verbal Permission Granted  Name::     [name]  Agency::  [facility]  Relationship::  son  Contact Information:  [phone]  Housing/Transportation Living arrangements for the past 2 months:  [place] of Information:  Patient Patient Interpreter Needed:  None Criminal Activity/Legal Involvement Pertinent to Current Situation/Hospitalization:  No - Comment as needed Significant Relationships:  Adult Children, Friend, [employer] Lives with:  Facility Resident Do you feel safe going back to the place where you live?  Yes Need for family participation in patient care:  Yes (Comment)  Care giving concerns:  No family at bedside   Social Worker assessment / plan:  CSW met patient at bedside to discuss her return back to [facility]. Patient stated she is eager to go back to facility. Patient is from [facility] and is a long term resident. CSW reached out to facility and spoke with Olivia Mackie (admission coordinator). Olivia Mackie stated patient can come back today  Employment status:  Retired Nurse, adult PT Recommendations:  Not assessed at this time Information / Referral to community resources:  [place]  Patient/Family's Response to care:  Patient appreciates CSW role in care  Patient/Family's Understanding of and Emotional Response to Diagnosis, Current Treatment, and Prognosis:  Patient eager to return back to facility  Emotional Assessment Appearance:  Appears stated age Attitude/Demeanor/Rapport:  Engaged Affect (typically observed):  Accepting Orientation:   Oriented to Self, Oriented to Place, Oriented to  Time, Oriented to Situation Alcohol / Substance use:  Not Applicable Psych involvement (Current and /or in the community):  No (Comment)  Discharge Needs  Concerns to be addressed:  No discharge needs identified Readmission within the last 30 days:  No Current discharge risk:  None Barriers to Discharge:  No Barriers Identified   Wende Neighbors, LCSW 03/21/2018, 10:10 AM

## 2018-03-21 NOTE — Progress Notes (Signed)
Clinical Social Worker facilitated patient discharge including contacting patient family and facility to confirm patient discharge plans.  Clinical information faxed to facility and family agreeable with plan.  CSW arranged ambulance transport via [facility] to [facility] .  RN to call  [phone] (pt will go back in rm# [phone]) for report prior to discharge.  Clinical Social Worker will sign off for now as social work intervention is no longer needed. Please consult [place] again if new need arises.  Rhea Pink, MSW, [place]

## 2018-03-21 NOTE — Plan of Care (Signed)
Problem: Education: Goal: Knowledge of General Education information will improve Description Including pain rating scale, medication(s)/side effects and non-pharmacologic comfort measures Outcome: Adequate for Discharge   Problem: Health Behavior/Discharge Planning: Goal: Ability to manage health-related needs will improve Outcome: Adequate for Discharge   Problem: Clinical Measurements: Goal: Ability to maintain clinical measurements within normal limits will improve Outcome: Adequate for Discharge Goal: Will remain free from infection Outcome: Adequate for Discharge Goal: Diagnostic test results will improve Outcome: Adequate for Discharge Goal: Respiratory complications will improve Outcome: Adequate for Discharge Goal: Cardiovascular complication will be avoided Outcome: Adequate for Discharge   Problem: Activity: Goal: Risk for activity intolerance will decrease Outcome: Adequate for Discharge   Problem: Nutrition: Goal: Adequate nutrition will be maintained Outcome: Adequate for Discharge   Problem: Coping: Goal: Level of anxiety will decrease Outcome: Adequate for Discharge   Problem: Elimination: Goal: Will not experience complications related to bowel motility Outcome: Adequate for Discharge Goal: Will not experience complications related to urinary retention Outcome: Adequate for Discharge   Problem: Pain Managment: Goal: General experience of comfort will improve Outcome: Adequate for Discharge   Problem: Safety: Goal: Ability to remain free from injury will improve Outcome: Adequate for Discharge   Problem: Skin Integrity: Goal: Risk for impaired skin integrity will decrease Outcome: Adequate for Discharge   Problem: Education: Goal: Knowledge of General Education information will improve Description Including pain rating scale, medication(s)/side effects and non-pharmacologic comfort measures Outcome: Adequate for Discharge   Problem: Health  Behavior/Discharge Planning: Goal: Ability to manage health-related needs will improve Outcome: Adequate for Discharge   Problem: Clinical Measurements: Goal: Ability to maintain clinical measurements within normal limits will improve Outcome: Adequate for Discharge Goal: Will remain free from infection Outcome: Adequate for Discharge Goal: Diagnostic test results will improve Outcome: Adequate for Discharge Goal: Respiratory complications will improve Outcome: Adequate for Discharge Goal: Cardiovascular complication will be avoided Outcome: Adequate for Discharge   Problem: Activity: Goal: Risk for activity intolerance will decrease Outcome: Adequate for Discharge   Problem: Nutrition: Goal: Adequate nutrition will be maintained Outcome: Adequate for Discharge   Problem: Coping: Goal: Level of anxiety will decrease Outcome: Adequate for Discharge   Problem: Elimination: Goal: Will not experience complications related to bowel motility Outcome: Adequate for Discharge Goal: Will not experience complications related to urinary retention Outcome: Adequate for Discharge   Problem: Pain Managment: Goal: General experience of comfort will improve Outcome: Adequate for Discharge   Problem: Safety: Goal: Ability to remain free from injury will improve Outcome: Adequate for Discharge   Problem: Skin Integrity: Goal: Risk for impaired skin integrity will decrease Outcome: Adequate for Discharge   Problem: Education: Goal: Knowledge of General Education information will improve Description Including pain rating scale, medication(s)/side effects and non-pharmacologic comfort measures Outcome: Adequate for Discharge   Problem: Health Behavior/Discharge Planning: Goal: Ability to manage health-related needs will improve Outcome: Adequate for Discharge   Problem: Clinical Measurements: Goal: Ability to maintain clinical measurements within normal limits will  improve Outcome: Adequate for Discharge Goal: Will remain free from infection Outcome: Adequate for Discharge Goal: Diagnostic test results will improve Outcome: Adequate for Discharge Goal: Respiratory complications will improve Outcome: Adequate for Discharge Goal: Cardiovascular complication will be avoided Outcome: Adequate for Discharge   Problem: Activity: Goal: Risk for activity intolerance will decrease Outcome: Adequate for Discharge   Problem: Nutrition: Goal: Adequate nutrition will be maintained Outcome: Adequate for Discharge   Problem: Coping: Goal: Level of anxiety will decrease Outcome: Adequate for  Discharge   Problem: Elimination: Goal: Will not experience complications related to bowel motility Outcome: Adequate for Discharge Goal: Will not experience complications related to urinary retention Outcome: Adequate for Discharge   Problem: Pain Managment: Goal: General experience of comfort will improve Outcome: Adequate for Discharge   Problem: Safety: Goal: Ability to remain free from injury will improve Outcome: Adequate for Discharge   Problem: Skin Integrity: Goal: Risk for impaired skin integrity will decrease Outcome: Adequate for Discharge   Problem: Education: Goal: Knowledge of General Education information will improve Description Including pain rating scale, medication(s)/side effects and non-pharmacologic comfort measures Outcome: Adequate for Discharge   Problem: Health Behavior/Discharge Planning: Goal: Ability to manage health-related needs will improve Outcome: Adequate for Discharge   Problem: Clinical Measurements: Goal: Ability to maintain clinical measurements within normal limits will improve Outcome: Adequate for Discharge Goal: Will remain free from infection Outcome: Adequate for Discharge Goal: Diagnostic test results will improve Outcome: Adequate for Discharge Goal: Respiratory complications will improve Outcome:  Adequate for Discharge Goal: Cardiovascular complication will be avoided Outcome: Adequate for Discharge   Problem: Activity: Goal: Risk for activity intolerance will decrease Outcome: Adequate for Discharge   Problem: Nutrition: Goal: Adequate nutrition will be maintained Outcome: Adequate for Discharge   Problem: Coping: Goal: Level of anxiety will decrease Outcome: Adequate for Discharge   Problem: Elimination: Goal: Will not experience complications related to bowel motility Outcome: Adequate for Discharge Goal: Will not experience complications related to urinary retention Outcome: Adequate for Discharge   Problem: Pain Managment: Goal: General experience of comfort will improve Outcome: Adequate for Discharge   Problem: Safety: Goal: Ability to remain free from injury will improve Outcome: Adequate for Discharge   Problem: Skin Integrity: Goal: Risk for impaired skin integrity will decrease Outcome: Adequate for Discharge

## 2018-04-28 ENCOUNTER — Ambulatory Visit (INDEPENDENT_AMBULATORY_CARE_PROVIDER_SITE_OTHER): Payer: Medicare Other | Admitting: Podiatry

## 2018-04-28 ENCOUNTER — Encounter: Payer: Self-pay | Admitting: Podiatry

## 2018-04-28 ENCOUNTER — Ambulatory Visit: Payer: Medicare Other | Admitting: Podiatry

## 2018-04-28 ENCOUNTER — Other Ambulatory Visit: Payer: Self-pay

## 2018-04-28 DIAGNOSIS — E0843 Diabetes mellitus due to underlying condition with diabetic autonomic (poly)neuropathy: Secondary | ICD-10-CM

## 2018-04-28 DIAGNOSIS — B351 Tinea unguium: Secondary | ICD-10-CM | POA: Diagnosis not present

## 2018-04-28 DIAGNOSIS — M79676 Pain in unspecified toe(s): Secondary | ICD-10-CM

## 2018-04-29 NOTE — Progress Notes (Signed)
   SUBJECTIVE Patient with a history of diabetes mellitus presents to office today complaining of elongated, thickened nails that cause pain while ambulating in shoes. She is unable to trim her own nails. Patient is here for further evaluation and treatment.   Past Medical History:  Diagnosis Date  . 1st degree AV block   . Acute renal failure ([place])    due to dehydration-Sept 2009; Normal creat 1.2 on fu 05/13/2009  . Allergic rhinitis   . Anemia    NOS chronic disease. Hgb 11.6gm% 04/14/2009  . Bradycardia   . CAD (coronary artery disease)    a. s/p DES mLAD 2005. b. Cutting balloon angioplasty for ISR 2006. c. LHC 08/2010 nonobstructive. d. Myoview 06/2012: small anteroapical infarct/mod inferolat wall infarct but no ischemia, EF 75%.  . Cancer ([place])    on nose and had it removed  . Chronic anticoagulation   . CKD (chronic kidney disease), stage III ([place])   . Dementia    slight  . Diabetes mellitus type II   . Diverticulitis of colon   . DM neuropathy, type II diabetes mellitus ([place])   . GERD (gastroesophageal reflux disease)   . HTN (hypertension)   . Hx of colonoscopy   . Hyperlipidemia   . IBS (irritable bowel syndrome)   . Multinodular goiter   . Obesity    BMI-37  . Orthostatic hypotension   . Osteoarthritis    lower back  . Osteoporosis   . Pneumonia   . PVC's (premature ventricular contractions)    a. seen on telemetry 02/2016.  Marland Kitchen RBBB   . Rheumatoid arthritis ([place])   . Spinal stenosis    djd lumbar    OBJECTIVE General Patient is awake, alert, and oriented x 3 and in no acute distress. Derm Skin is dry and supple bilateral. Negative open lesions or macerations. Remaining integument unremarkable. Nails are tender, long, thickened and dystrophic with subungual debris, consistent with onychomycosis, 1-5 bilateral. No signs of infection noted. Vasc  DP and PT pedal pulses palpable bilaterally. Temperature gradient within normal limits.  Neuro Epicritic and  protective threshold sensation diminished bilaterally.  Musculoskeletal Exam No symptomatic pedal deformities noted bilateral. Muscular strength within normal limits.  ASSESSMENT 1. Diabetes Mellitus w/ peripheral neuropathy 2. Onychomycosis of nail due to dermatophyte bilateral 3. Pain in foot bilateral  PLAN OF CARE 1. Patient evaluated today. 2. Instructed to maintain good pedal hygiene and foot care. Stressed importance of controlling blood sugar.  3. Mechanical debridement of nails 1-5 bilaterally performed using a nail nipper. Filed with dremel without incident.  4. Return to clinic in 3 mos.     Edrick Kins, DPM [facility]  Dr. Edrick Kins, [address]                                        [place], [place] [address]                Office [phone]  Fax [phone]

## 2018-05-05 ENCOUNTER — Ambulatory Visit: Payer: Medicare Other | Admitting: Podiatry

## 2018-09-14 IMAGING — DX DG WRIST COMPLETE 3+V*R*
4 series · 4 of 4 positions shown · non-contrast
Comparison: None.

CLINICAL DATA: Wrist pain following fall 2 days ago, initial
encounter

EXAM:
RIGHT WRIST - COMPLETE 3+ VIEW

[wrist ap (1 of 2)]
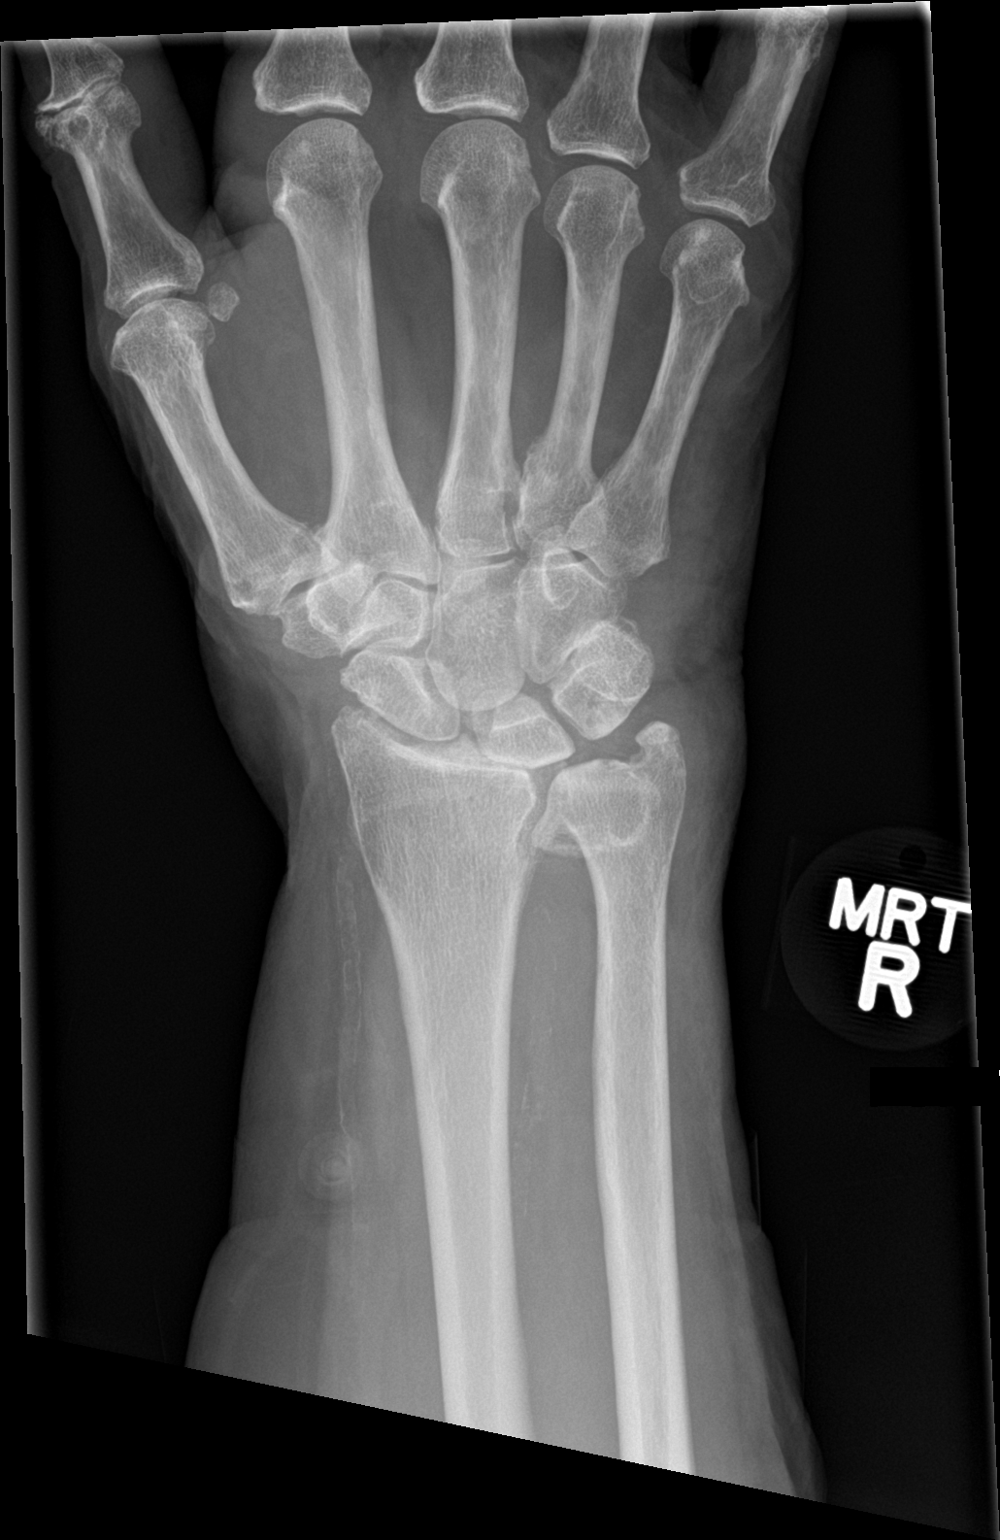

[wrist obl]
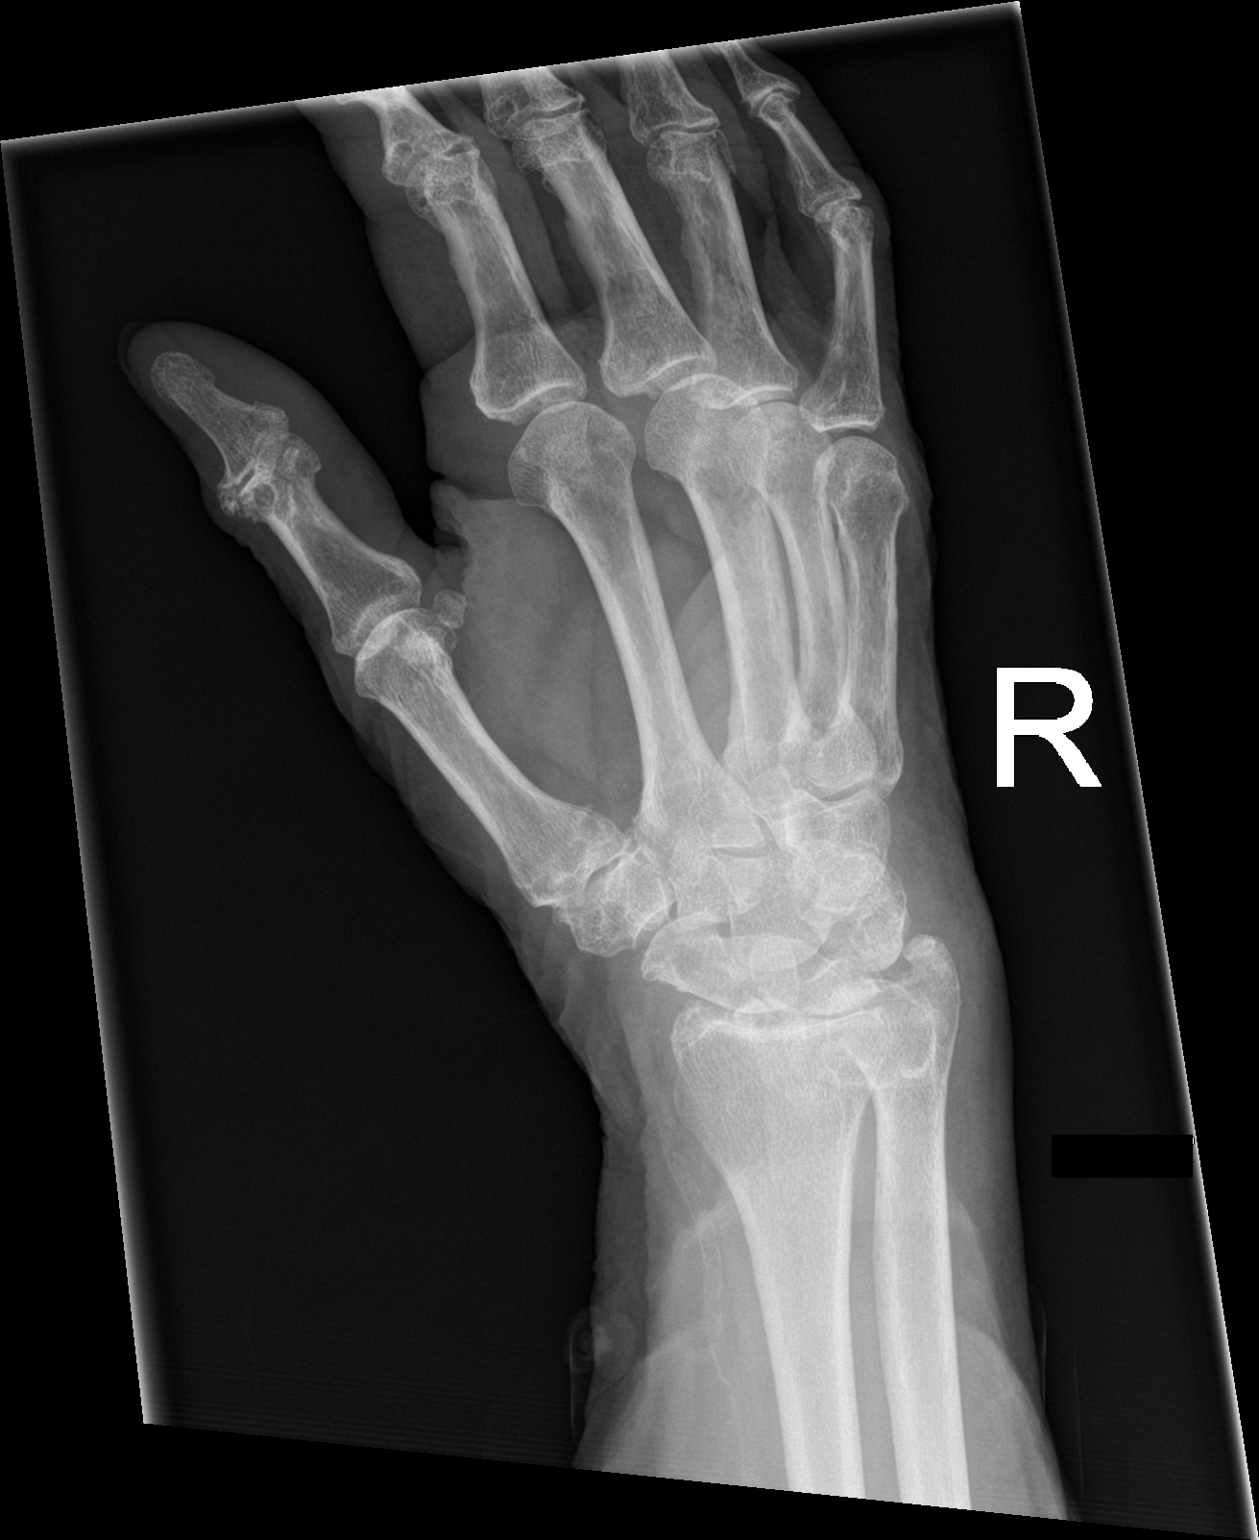

[wrist lat]
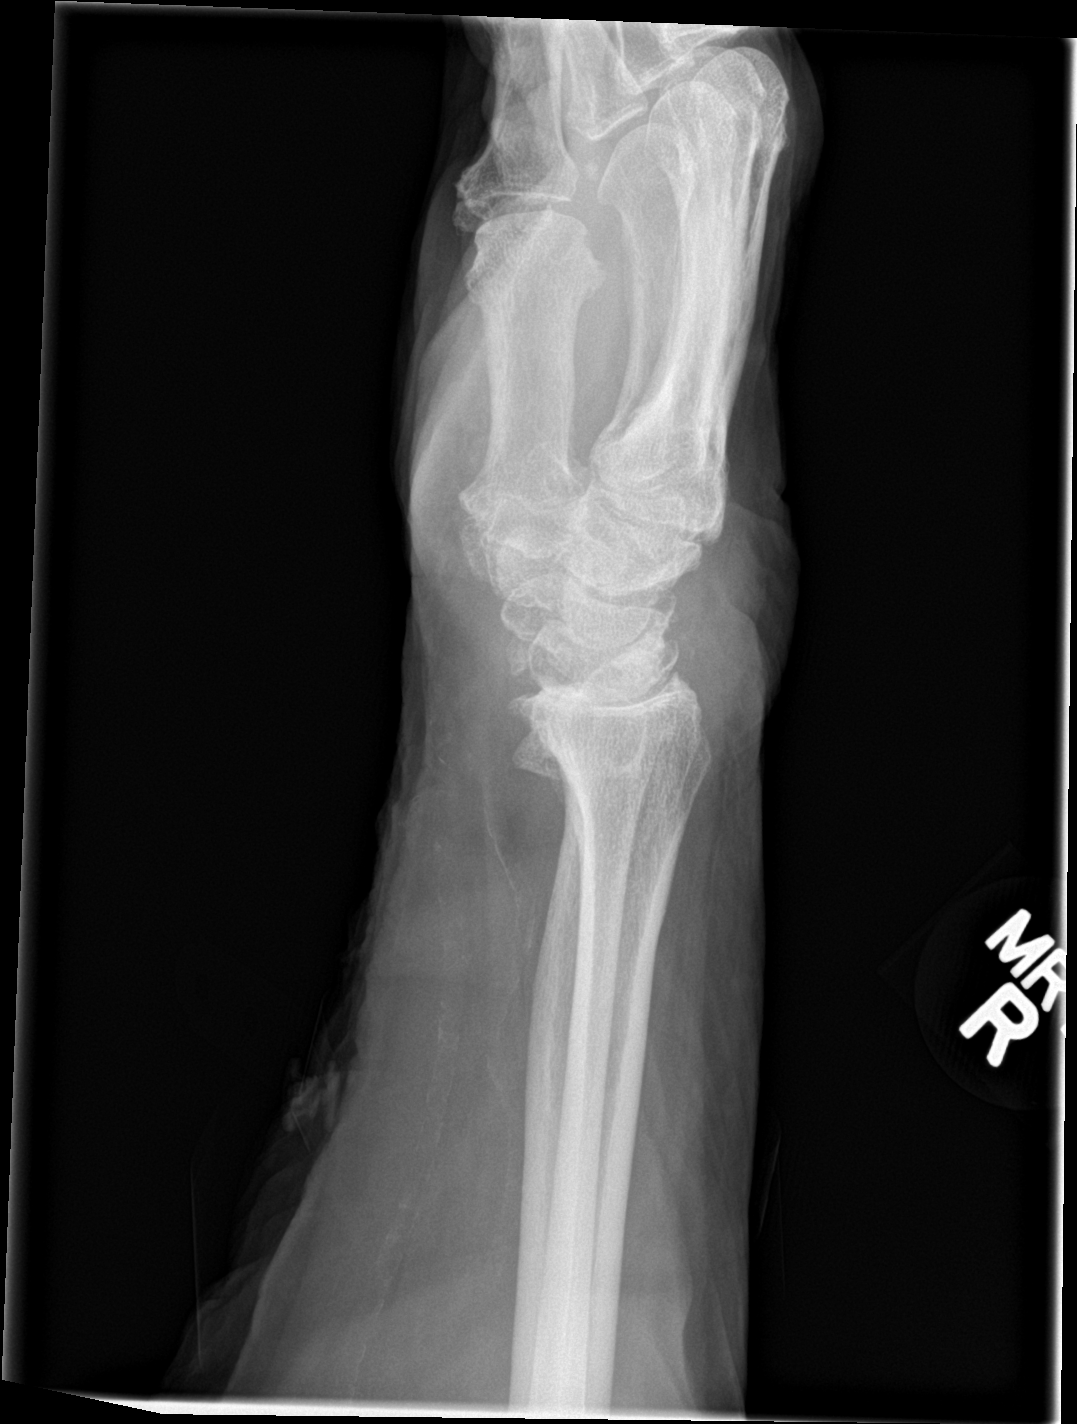

[wrist ap (2 of 2)]
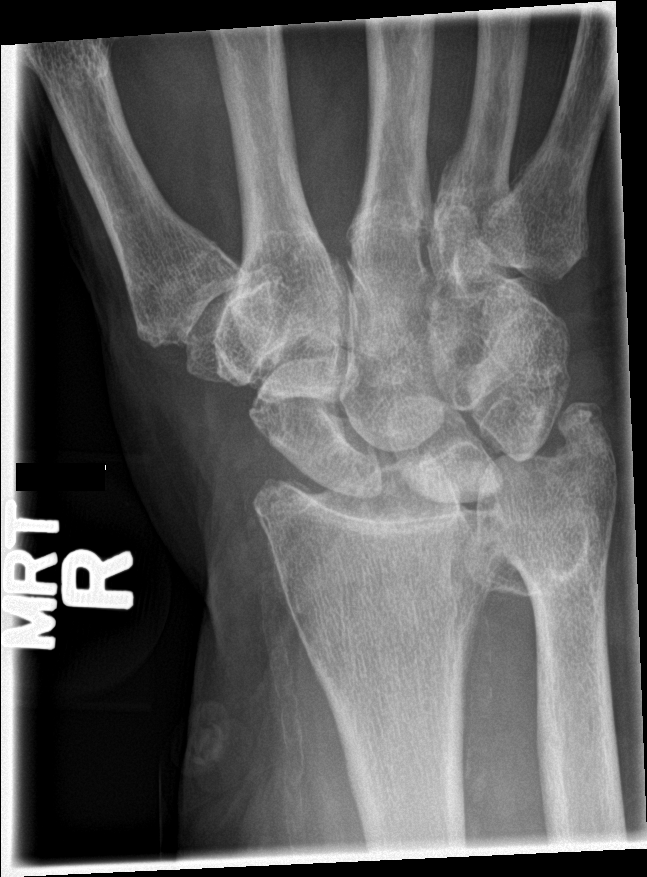

[4 of 4 positions shown; findings below may reference images not displayed]

FINDINGS: Irregularity of the distal ulna is noted likely related to prior
fracture. Some remodeling of the distal radius is noted related to
these changes. Degenerative changes of the radiocarpal articulation
are noted. Widening of the scapholunate space is noted which may be
related to ligamentous injury. No definitive fracture is seen.
IMPRESSION: Chronic changes about the wrist joint without acute abnormality.

## 2019-07-13 ENCOUNTER — Observation Stay (HOSPITAL_COMMUNITY): Payer: Medicare Other

## 2019-07-13 ENCOUNTER — Emergency Department (HOSPITAL_COMMUNITY): Payer: Medicare Other

## 2019-07-13 ENCOUNTER — Inpatient Hospital Stay (HOSPITAL_COMMUNITY)
Admission: EM | Admit: 2019-07-13 | Discharge: 2019-07-15 | DRG: 393 | Disposition: A | Discharge status: Skilled Nursing Facility | Payer: Medicare Other | Source: Skilled Nursing Facility | Attending: Internal Medicine | Admitting: Internal Medicine

## 2019-07-13 ENCOUNTER — Other Ambulatory Visit: Payer: Self-pay

## 2019-07-13 ENCOUNTER — Encounter (HOSPITAL_COMMUNITY): Payer: Self-pay

## 2019-07-13 DIAGNOSIS — K2100 Gastro-esophageal reflux disease with esophagitis, without bleeding: Secondary | ICD-10-CM | POA: Diagnosis present

## 2019-07-13 DIAGNOSIS — L89152 Pressure ulcer of sacral region, stage 2: Secondary | ICD-10-CM | POA: Diagnosis present

## 2019-07-13 DIAGNOSIS — J9811 Atelectasis: Secondary | ICD-10-CM | POA: Diagnosis present

## 2019-07-13 DIAGNOSIS — R531 Weakness: Secondary | ICD-10-CM

## 2019-07-13 DIAGNOSIS — W19XXXA Unspecified fall, initial encounter: Secondary | ICD-10-CM

## 2019-07-13 DIAGNOSIS — M810 Age-related osteoporosis without current pathological fracture: Secondary | ICD-10-CM | POA: Diagnosis present

## 2019-07-13 DIAGNOSIS — Z66 Do not resuscitate: Secondary | ICD-10-CM | POA: Diagnosis present

## 2019-07-13 DIAGNOSIS — K5900 Constipation, unspecified: Secondary | ICD-10-CM | POA: Diagnosis present

## 2019-07-13 DIAGNOSIS — D631 Anemia in chronic kidney disease: Secondary | ICD-10-CM | POA: Diagnosis present

## 2019-07-13 DIAGNOSIS — Z8249 Family history of ischemic heart disease and other diseases of the circulatory system: Secondary | ICD-10-CM

## 2019-07-13 DIAGNOSIS — K922 Gastrointestinal hemorrhage, unspecified: Secondary | ICD-10-CM | POA: Diagnosis not present

## 2019-07-13 DIAGNOSIS — G8929 Other chronic pain: Secondary | ICD-10-CM | POA: Diagnosis present

## 2019-07-13 DIAGNOSIS — I2510 Atherosclerotic heart disease of native coronary artery without angina pectoris: Secondary | ICD-10-CM | POA: Diagnosis present

## 2019-07-13 DIAGNOSIS — Z955 Presence of coronary angioplasty implant and graft: Secondary | ICD-10-CM

## 2019-07-13 DIAGNOSIS — M069 Rheumatoid arthritis, unspecified: Secondary | ICD-10-CM | POA: Diagnosis present

## 2019-07-13 DIAGNOSIS — I252 Old myocardial infarction: Secondary | ICD-10-CM

## 2019-07-13 DIAGNOSIS — Z79899 Other long term (current) drug therapy: Secondary | ICD-10-CM

## 2019-07-13 DIAGNOSIS — I700 Atherosclerosis of aorta: Secondary | ICD-10-CM | POA: Diagnosis present

## 2019-07-13 DIAGNOSIS — E669 Obesity, unspecified: Secondary | ICD-10-CM | POA: Diagnosis present

## 2019-07-13 DIAGNOSIS — E785 Hyperlipidemia, unspecified: Secondary | ICD-10-CM | POA: Diagnosis present

## 2019-07-13 DIAGNOSIS — K625 Hemorrhage of anus and rectum: Secondary | ICD-10-CM | POA: Diagnosis not present

## 2019-07-13 DIAGNOSIS — E1122 Type 2 diabetes mellitus with diabetic chronic kidney disease: Secondary | ICD-10-CM | POA: Diagnosis present

## 2019-07-13 DIAGNOSIS — Z90710 Acquired absence of both cervix and uterus: Secondary | ICD-10-CM

## 2019-07-13 DIAGNOSIS — F0390 Unspecified dementia without behavioral disturbance: Secondary | ICD-10-CM | POA: Diagnosis present

## 2019-07-13 DIAGNOSIS — M2578 Osteophyte, vertebrae: Secondary | ICD-10-CM | POA: Diagnosis present

## 2019-07-13 DIAGNOSIS — Z803 Family history of malignant neoplasm of breast: Secondary | ICD-10-CM

## 2019-07-13 DIAGNOSIS — M545 Low back pain: Secondary | ICD-10-CM | POA: Diagnosis present

## 2019-07-13 DIAGNOSIS — Z7901 Long term (current) use of anticoagulants: Secondary | ICD-10-CM

## 2019-07-13 DIAGNOSIS — I480 Paroxysmal atrial fibrillation: Secondary | ICD-10-CM | POA: Diagnosis present

## 2019-07-13 DIAGNOSIS — N183 Chronic kidney disease, stage 3 unspecified: Secondary | ICD-10-CM | POA: Diagnosis present

## 2019-07-13 DIAGNOSIS — Z880 Allergy status to penicillin: Secondary | ICD-10-CM

## 2019-07-13 DIAGNOSIS — E119 Type 2 diabetes mellitus without complications: Secondary | ICD-10-CM

## 2019-07-13 DIAGNOSIS — K649 Unspecified hemorrhoids: Principal | ICD-10-CM | POA: Diagnosis present

## 2019-07-13 DIAGNOSIS — Z6837 Body mass index (BMI) 37.0-37.9, adult: Secondary | ICD-10-CM

## 2019-07-13 DIAGNOSIS — I440 Atrioventricular block, first degree: Secondary | ICD-10-CM | POA: Diagnosis present

## 2019-07-13 DIAGNOSIS — I129 Hypertensive chronic kidney disease with stage 1 through stage 4 chronic kidney disease, or unspecified chronic kidney disease: Secondary | ICD-10-CM | POA: Diagnosis present

## 2019-07-13 DIAGNOSIS — E1142 Type 2 diabetes mellitus with diabetic polyneuropathy: Secondary | ICD-10-CM | POA: Diagnosis present

## 2019-07-13 DIAGNOSIS — Z79890 Hormone replacement therapy (postmenopausal): Secondary | ICD-10-CM

## 2019-07-13 DIAGNOSIS — Z9049 Acquired absence of other specified parts of digestive tract: Secondary | ICD-10-CM

## 2019-07-13 DIAGNOSIS — K5731 Diverticulosis of large intestine without perforation or abscess with bleeding: Secondary | ICD-10-CM | POA: Diagnosis present

## 2019-07-13 DIAGNOSIS — Z20828 Contact with and (suspected) exposure to other viral communicable diseases: Secondary | ICD-10-CM | POA: Diagnosis present

## 2019-07-13 LAB — ABO/RH: ABO/RH(D): A POS

## 2019-07-13 LAB — TYPE AND SCREEN
ABO/RH(D): A POS
Antibody Screen: NEGATIVE

## 2019-07-13 LAB — CBC WITH DIFFERENTIAL/PLATELET
Abs Immature Granulocytes: 0.03 10*3/uL (ref 0.00–0.07)
Basophils Absolute: 0 10*3/uL (ref 0.0–0.1)
Basophils Relative: 1 %
Eosinophils Absolute: 0.1 10*3/uL (ref 0.0–0.5)
Eosinophils Relative: 2 %
HCT: 39.4 % (ref 36.0–46.0)
Hemoglobin: 12.8 g/dL (ref 12.0–15.0)
Immature Granulocytes: 1 %
Lymphocytes Relative: 24 %
Lymphs Abs: 1.4 10*3/uL (ref 0.7–4.0)
MCH: 30.8 pg (ref 26.0–34.0)
MCHC: 32.5 g/dL (ref 30.0–36.0)
MCV: 94.7 fL (ref 80.0–100.0)
Monocytes Absolute: 0.5 10*3/uL (ref 0.1–1.0)
Monocytes Relative: 8 %
Neutro Abs: 3.9 10*3/uL (ref 1.7–7.7)
Neutrophils Relative %: 64 %
Platelets: 246 10*3/uL (ref 150–400)
RBC: 4.16 MIL/uL (ref 3.87–5.11)
RDW: 14.1 % (ref 11.5–15.5)
WBC: 6 10*3/uL (ref 4.0–10.5)
nRBC: 0 % (ref 0.0–0.2)

## 2019-07-13 LAB — LACTIC ACID, PLASMA: Lactic Acid, Venous: 1 mmol/L (ref 0.5–1.9)

## 2019-07-13 LAB — COMPREHENSIVE METABOLIC PANEL
ALT: 16 U/L (ref 0–44)
AST: 20 U/L (ref 15–41)
Albumin: 3.1 g/dL — ABNORMAL LOW (ref 3.5–5.0)
Alkaline Phosphatase: 58 U/L (ref 38–126)
Anion gap: 12 (ref 5–15)
BUN: 17 mg/dL (ref 8–23)
CO2: 30 mmol/L (ref 22–32)
Calcium: 8.9 mg/dL (ref 8.9–10.3)
Chloride: 96 mmol/L — ABNORMAL LOW (ref 98–111)
Creatinine, Ser: 0.97 mg/dL (ref 0.44–1.00)
GFR calc Af Amer: 58 mL/min — ABNORMAL LOW (ref >60)
GFR calc non Af Amer: 50 mL/min — ABNORMAL LOW (ref >60)
Glucose, Bld: 147 mg/dL — ABNORMAL HIGH (ref 70–99)
Potassium: 4.4 mmol/L (ref 3.5–5.1)
Sodium: 138 mmol/L (ref 135–145)
Total Bilirubin: 0.7 mg/dL (ref 0.3–1.2)
Total Protein: 6.6 g/dL (ref 6.5–8.1)

## 2019-07-13 LAB — PROTIME-INR
INR: 1.4 — ABNORMAL HIGH (ref 0.8–1.2)
Prothrombin Time: 17.4 seconds — ABNORMAL HIGH (ref 11.4–15.2)

## 2019-07-13 LAB — POC SARS CORONAVIRUS 2 AG -  ED: SARS Coronavirus 2 Ag: NEGATIVE

## 2019-07-13 LAB — POC OCCULT BLOOD, ED: Fecal Occult Bld: POSITIVE — AB

## 2019-07-13 LAB — SARS CORONAVIRUS 2 (TAT 6-24 HRS): SARS Coronavirus 2: NEGATIVE

## 2019-07-13 MED ORDER — DIVALPROEX SODIUM 125 MG PO DR TAB
125.0000 mg | DELAYED_RELEASE_TABLET | Freq: Every day | ORAL | Status: DC
  Administered 2019-07-14 (×2): 125 mg via ORAL
  Filled 2019-07-13 (×3): qty 1

## 2019-07-13 MED ORDER — ONDANSETRON HCL 4 MG PO TABS: 4.0000 mg | ORAL_TABLET | Freq: Four times a day (QID) | ORAL | Status: DC | PRN

## 2019-07-13 MED ORDER — SENNOSIDES-DOCUSATE SODIUM 8.6-50 MG PO TABS
1.0000 | ORAL_TABLET | Freq: Two times a day (BID) | ORAL | Status: DC
  Administered 2019-07-14 – 2019-07-15 (×3): 1 via ORAL
  Filled 2019-07-13 (×3): qty 1

## 2019-07-13 MED ORDER — AMLODIPINE BESYLATE 5 MG PO TABS
2.5000 mg | ORAL_TABLET | Freq: Every day | ORAL | Status: DC
  Administered 2019-07-14 – 2019-07-15 (×2): 2.5 mg via ORAL
  Filled 2019-07-13 (×2): qty 1

## 2019-07-13 MED ORDER — IOHEXOL 300 MG/ML  SOLN
100.0000 mL | Freq: Once | INTRAMUSCULAR | Status: AC | PRN
  Administered 2019-07-13: 100 mL via INTRAVENOUS

## 2019-07-13 MED ORDER — SODIUM CHLORIDE 0.9 % IV SOLN
8.0000 mg/h | INTRAVENOUS | Status: DC
  Administered 2019-07-13: 8 mg/h via INTRAVENOUS
  Filled 2019-07-13: qty 80

## 2019-07-13 MED ORDER — BISACODYL 10 MG RE SUPP: 10.0000 mg | Freq: Every day | RECTAL | Status: DC | PRN

## 2019-07-13 MED ORDER — LISINOPRIL 10 MG PO TABS
10.0000 mg | ORAL_TABLET | Freq: Every day | ORAL | Status: DC
  Administered 2019-07-14 – 2019-07-15 (×2): 10 mg via ORAL
  Filled 2019-07-13 (×2): qty 1

## 2019-07-13 MED ORDER — SODIUM CHLORIDE 0.9 % IV BOLUS
1000.0000 mL | Freq: Once | INTRAVENOUS | Status: AC
  Administered 2019-07-13: 12:00:00 1000 mL via INTRAVENOUS

## 2019-07-13 MED ORDER — SODIUM CHLORIDE 0.9 % IV SOLN: INTRAVENOUS | Status: AC

## 2019-07-13 MED ORDER — SODIUM CHLORIDE 0.9 % IV SOLN: INTRAVENOUS | Status: DC

## 2019-07-13 MED ORDER — TRAMADOL HCL 50 MG PO TABS: 25.0000 mg | ORAL_TABLET | Freq: Two times a day (BID) | ORAL | Status: DC | PRN

## 2019-07-13 MED ORDER — NITROGLYCERIN 0.4 MG SL SUBL: 0.4000 mg | SUBLINGUAL_TABLET | SUBLINGUAL | Status: DC | PRN

## 2019-07-13 MED ORDER — SODIUM CHLORIDE (PF) 0.9 % IJ SOLN
INTRAMUSCULAR | Status: AC
  Filled 2019-07-13: qty 50

## 2019-07-13 MED ORDER — DONEPEZIL HCL 10 MG PO TABS
10.0000 mg | ORAL_TABLET | Freq: Every day | ORAL | Status: DC
  Administered 2019-07-14 (×2): 10 mg via ORAL
  Filled 2019-07-13: qty 1
  Filled 2019-07-13: qty 2

## 2019-07-13 MED ORDER — ACETAMINOPHEN 650 MG RE SUPP: 650.0000 mg | Freq: Four times a day (QID) | RECTAL | Status: DC | PRN

## 2019-07-13 MED ORDER — FLUTICASONE PROPIONATE 50 MCG/ACT NA SUSP
2.0000 | Freq: Every day | NASAL | Status: DC
  Filled 2019-07-13: qty 16

## 2019-07-13 MED ORDER — GABAPENTIN 300 MG PO CAPS
300.0000 mg | ORAL_CAPSULE | Freq: Every day | ORAL | Status: DC
  Administered 2019-07-14 – 2019-07-15 (×2): 300 mg via ORAL
  Filled 2019-07-13 (×2): qty 1

## 2019-07-13 MED ORDER — TRAZODONE HCL 50 MG PO TABS
75.0000 mg | ORAL_TABLET | Freq: Every day | ORAL | Status: DC
  Administered 2019-07-14 (×2): 75 mg via ORAL
  Filled 2019-07-13 (×2): qty 2

## 2019-07-13 MED ORDER — ACETAMINOPHEN 325 MG PO TABS: 650.0000 mg | ORAL_TABLET | Freq: Four times a day (QID) | ORAL | Status: DC | PRN

## 2019-07-13 MED ORDER — OXYBUTYNIN CHLORIDE 5 MG PO TABS
5.0000 mg | ORAL_TABLET | Freq: Two times a day (BID) | ORAL | Status: DC
  Administered 2019-07-13 – 2019-07-15 (×4): 5 mg via ORAL
  Filled 2019-07-13 (×3): qty 1

## 2019-07-13 MED ORDER — ONDANSETRON HCL 4 MG/2ML IJ SOLN: 4.0000 mg | Freq: Four times a day (QID) | INTRAMUSCULAR | Status: DC | PRN

## 2019-07-13 MED ORDER — LORATADINE 10 MG PO TABS
10.0000 mg | ORAL_TABLET | Freq: Every day | ORAL | Status: DC
  Administered 2019-07-14 – 2019-07-15 (×2): 10 mg via ORAL
  Filled 2019-07-13 (×2): qty 1

## 2019-07-13 MED ORDER — LATANOPROST 0.005 % OP SOLN
1.0000 [drp] | Freq: Every day | OPHTHALMIC | Status: DC
  Administered 2019-07-14: 1 [drp] via OPHTHALMIC
  Filled 2019-07-13: qty 2.5

## 2019-07-13 MED ORDER — POLYETHYLENE GLYCOL 3350 17 G PO PACK
17.0000 g | PACK | Freq: Two times a day (BID) | ORAL | Status: DC
  Administered 2019-07-14 – 2019-07-15 (×3): 17 g via ORAL
  Filled 2019-07-13 (×3): qty 1

## 2019-07-13 MED ORDER — GABAPENTIN 300 MG PO CAPS
600.0000 mg | ORAL_CAPSULE | Freq: Every evening | ORAL | Status: DC
  Administered 2019-07-14 (×2): 600 mg via ORAL
  Filled 2019-07-13 (×2): qty 2

## 2019-07-13 MED ORDER — SODIUM CHLORIDE 0.9 % IV SOLN
80.0000 mg | Freq: Once | INTRAVENOUS | Status: AC
  Administered 2019-07-13: 80 mg via INTRAVENOUS
  Filled 2019-07-13: qty 80

## 2019-07-13 NOTE — H&P (Signed)
History and Physical    [name] [id number] DOB: [birth year] DOA: 07/13/2019  PCP: Chesley Noon, MD   Patient coming from: SNF ([facility])  Chief Complaint: Rectal Bleeding   HPI: [name] is a 85 y.o. female with a PMH signficiant for but not limited too she had first-degree AV block and history of acute renal failure, allergic rhinitis, anemia of not other specified, CAD status post DES to mid LAD, history of CKD stage III, diabetes mellitus type 2 complicated by neuropathy, hypertension, hyperlipidemia, obesity, dementia, as well as other comorbidities including atrial fibrillation on anticoagulation who presents with rectal bleeding this morning with blood clots.  Of note she was being changed at her nursing facility and they found that she had some rectal bleeding and noted blood when they change her diaper.  They noted to have a clot and stated it was maroon in color.  She is on anticoagulation with apixaban due to her history of proximal atrial fibrillation.  Of note she recently had Covid diagnosis a few weeks ago and has had a persistent cough but recovered since then.  Due to her current baseline dementia she is unable to provide much of a subjective history but she did note that she was from [facility] and that she did know herself and her birthday but did not know where she was in the hospital. Most of the history is obtained from the chart as well as the patient's granddaughter.  Patient denies any chest pain, lightheadedness or dizziness or any abdominal pain denies any shortness of breath chills or fever.  Patient did admit to having some crampy abdominal pain on the right side of her abdomen.  [facility] was asked to admit this patient for rectal bleeding and gastroenterology was consulted for further evaluation recommendations.  ED Course: In the ED she had basic blood work done and she tested positive for FOBT.  She was given a liter normal saline bolus and then  she was placed on normal saline 125 which was reduced down to 75 mL's per day.  Is also placed on Protonix drip which was then changed to p.o. by gastroenterology.  Of note SARS corona 2 testing was negative.  Patient also had a chest x-ray and a CT of the abdomen pelvis  Review of Systems: As per HPI otherwise all other systems reviewed and negative.   Past Medical History:  Diagnosis Date  . 1st degree AV block   . Acute renal failure ([place])    due to dehydration-Sept 2009; Normal creat 1.2 on fu 05/13/2009  . Allergic rhinitis   . Anemia    NOS chronic disease. Hgb 11.6gm% 04/14/2009  . Bradycardia   . CAD (coronary artery disease)    a. s/p DES mLAD 2005. b. Cutting balloon angioplasty for ISR 2006. c. LHC 08/2010 nonobstructive. d. Myoview 06/2012: small anteroapical infarct/mod inferolat wall infarct but no ischemia, EF 75%.  . Cancer ([place])    on nose and had it removed  . Chronic anticoagulation   . CKD (chronic kidney disease), stage III   . Dementia    slight  . Diabetes mellitus type II   . Diverticulitis of colon   . DM neuropathy, type II diabetes mellitus ([place])   . GERD (gastroesophageal reflux disease)   . HTN (hypertension)   . Hx of colonoscopy   . Hyperlipidemia   . IBS (irritable bowel syndrome)   . Multinodular goiter   . Obesity  BMI-37  . Orthostatic hypotension   . Osteoarthritis    lower back  . Osteoporosis   . Pneumonia   . PVC's (premature ventricular contractions)    a. seen on telemetry 02/2016.  Marland Kitchen RBBB   . Rheumatoid arthritis ([place])   . Spinal stenosis    djd lumbar   Past Surgical History:  Procedure Laterality Date  . ABDOMINAL HYSTERECTOMY    . bilateral arthroscopic knee surgery    . carpel tunnel wrist rt    . CHOLECYSTECTOMY    . CORONARY ANGIOPLASTY     stent  . NASAL SINUS SURGERY     x2  . stent surgery     SOCIAL HISTORY  reports that she has never smoked. She has never used smokeless tobacco. She reports that she does not  drink alcohol or use drugs.  Allergies  Allergen Reactions  . Amoxicillin Other (See Comments)    UNKNOWN   Family History  Problem Relation Age of Onset  . Heart attack Father   . Breast cancer Sister   . Heart disease Brother   . Heart attack Son   . Heart attack Daughter   . Colon cancer Neg Hx   . Stroke Neg Hx    Prior to Admission medications   Medication Sig Start Date End Date Taking? Authorizing Provider  acetaminophen (TYLENOL) 500 MG tablet Take 1,000 mg by mouth 3 (three) times daily.   Yes [provider]  amLODipine (NORVASC) 2.5 MG tablet Take 2.5 mg by mouth daily. 06/16/19  Yes [provider]  apixaban (ELIQUIS) 5 MG TABS tablet Take 1 tablet (5 mg total) by mouth 2 (two) times daily. 01/31/15  Yes Weaver, Scott T, PA-C  cetirizine (ZYRTEC) 10 MG tablet Take 10 mg by mouth daily.   Yes [provider]  dextromethorphan-guaiFENesin (ROBITUSSIN-DM) 10-100 MG/5ML liquid Take 10 mLs by mouth every 4 (four) hours as needed for cough.   Yes [provider]  diclofenac sodium (VOLTAREN) 1 % GEL Apply 2 g topically 4 (four) times daily. 10/22/17  Yes Vann, Jessica U, DO  divalproex (DEPAKOTE) 125 MG DR tablet Take 125 mg by mouth at bedtime.    Yes [provider]  donepezil (ARICEPT) 10 MG tablet Take 10 mg by mouth at bedtime.   Yes [provider]  fluticasone (FLONASE) 50 MCG/ACT nasal spray Place 2 sprays into both nostrils daily.   Yes [provider]  furosemide (LASIX) 40 MG tablet Take 40 mg by mouth daily. 07/09/19  Yes [provider]  gabapentin (NEURONTIN) 300 MG capsule Take 300 mg by mouth daily. 06/21/19  Yes [provider]  gabapentin (NEURONTIN) 600 MG tablet Take 600 mg by mouth every evening.   Yes [provider]  lisinopril (PRINIVIL,ZESTRIL) 10 MG tablet Take 10 mg by mouth daily.   Yes [provider]  LUMIGAN 0.01 % SOLN Place 1 drop into both eyes at  bedtime. 02/11/17  Yes [provider]  Melatonin 10 MG TABS Take 10 mg by mouth at bedtime.   Yes [provider]  Menthol, Topical Analgesic, (BIOFREEZE) 4 % GEL Apply 1 application topically 2 (two) times daily.   Yes [provider]  nitroGLYCERIN (NITROSTAT) 0.4 MG SL tablet Place 1 tablet (0.4 mg total) under the tongue every 5 (five) minutes as needed for chest pain (Max 3 doses). 10/22/17  Yes Vann, Jessica U, DO  oxybutynin (DITROPAN) 5 MG tablet Take 5 mg by mouth 2 (  two) times daily.    Yes [provider]  polyethylene glycol (MIRALAX / GLYCOLAX) packet Take 17 g by mouth daily as needed for mild constipation. 10/22/17  Yes Vann, Jessica U, DO  senna-docusate (SENOKOT-S) 8.6-50 MG tablet Take 1 tablet by mouth daily.    Yes [provider]  traMADol (ULTRAM) 50 MG tablet Take 25 mg by mouth every 12 (twelve) hours as needed for moderate pain (given half a tablet for pain).   Yes [provider]  traZODone (DESYREL) 150 MG tablet Take 75 mg by mouth at bedtime.    Yes [provider]  linagliptin (TRADJENTA) 5 MG TABS tablet Take 1 tablet (5 mg total) by mouth daily. Patient not taking: Reported on 07/13/2019 10/22/17   Geradine Girt, DO   Physical Exam: Vitals:   07/13/19 1231 07/13/19 1334 07/13/19 1400 07/13/19 1430  BP: (!) 130/55 (!) 154/62 (!) 136/59 (!) 148/63  Pulse: 68 70 66 78  Resp: 18 18 18 18   Temp:      TempSrc:      SpO2: 97% 98% 97% 100%   Constitutional: WN/WD elderly Caucasian female who is slightly pleasantly demented currently and in no acute distress appears calm Eyes: Lids and conjunctivae normal, sclerae anicteric  ENMT: External Ears, Nose appear normal. Grossly normal hearing. Neck: Appears normal, supple, no cervical masses, normal ROM, no appreciable thyromegaly; no JVD Respiratory: Diminished to auscultation bilaterally at the bases, no wheezing, rales, rhonchi or crackles. Normal respiratory  effort and patient is not tachypenic. No accessory muscle use.  Not wearing any supplemental oxygen via nasal cannula and has unlabored breathing Cardiovascular: RRR, no murmurs / rubs / gallops. S1 and S2 auscultated. No extremity edema. Abdomen: Soft, tender to palpate in the right side of the abdomen, distended secondary body habitus. Bowel sounds positive.  GU: Deferred. Musculoskeletal: No clubbing / cyanosis of digits/nails. No joint deformity upper and lower extremities. Skin: No rashes, lesions, ulcers on limited skin evaluation. No induration; Warm and dry.  Neurologic: CN 2-12 grossly intact with no focal deficits.  Romberg sign cerebellar reflexes not assessed.  Psychiatric: Normal impaired judgment and insight. Alert and oriented x 2. Normal mood and appropriate affect.   Labs on Admission: I have personally reviewed following labs and imaging studies  CBC: Recent Labs  Lab 07/13/19 1121  WBC 6.0  NEUTROABS 3.9  HGB 12.8  HCT 39.4  MCV 94.7  PLT 0000000   Basic Metabolic Panel: Recent Labs  Lab 07/13/19 1121  NA 138  K 4.4  CL 96*  CO2 30  GLUCOSE 147*  BUN 17  CREATININE 0.97  CALCIUM 8.9   GFR: CrCl cannot be calculated (Unknown ideal weight.). Liver Function Tests: Recent Labs  Lab 07/13/19 1121  AST 20  ALT 16  ALKPHOS 58  BILITOT 0.7  PROT 6.6  ALBUMIN 3.1*   No results for input(s): LIPASE, AMYLASE in the last 168 hours. No results for input(s): AMMONIA in the last 168 hours. Coagulation Profile: Recent Labs  Lab 07/13/19 1121  INR 1.4*   Cardiac Enzymes: No results for input(s): CKTOTAL, CKMB, CKMBINDEX, TROPONINI in the last 168 hours. BNP (last 3 results) No results for input(s): PROBNP in the last 8760 hours. HbA1C: No results for input(s): HGBA1C in the last 72 hours. CBG: No results for input(s): GLUCAP in the last 168 hours. Lipid Profile: No results for input(s): CHOL, HDL, LDLCALC, TRIG, CHOLHDL, LDLDIRECT in the last 72  hours. Thyroid Function Tests:  No results for input(s): TSH, T4TOTAL, FREET4, T3FREE, THYROIDAB in the last 72 hours. Anemia Panel: No results for input(s): VITAMINB12, FOLATE, FERRITIN, TIBC, IRON, RETICCTPCT in the last 72 hours. Urine analysis:    Component Value Date/Time   COLORURINE AMBER (A) 01/15/2018 1412   APPEARANCEUR HAZY (A) 01/15/2018 1412   LABSPEC 1.014 01/15/2018 1412   PHURINE 6.0 01/15/2018 1412   GLUCOSEU NEGATIVE 01/15/2018 1412   GLUCOSEU NEGATIVE 11/24/2010 1224   HGBUR SMALL (A) 01/15/2018 1412   BILIRUBINUR NEGATIVE 01/15/2018 1412   KETONESUR 20 (A) 01/15/2018 1412   PROTEINUR 30 (A) 01/15/2018 1412   UROBILINOGEN 0.2 05/05/2014 2123   NITRITE POSITIVE (A) 01/15/2018 1412   LEUKOCYTESUR MODERATE (A) 01/15/2018 1412   Sepsis Labs: !!!!!!!!!!!!!!!!!!!!!!!!!!!!!!!!!!!!!!!!!!!! @LABRCNTIP (procalcitonin:4,lacticidven:4) )No results found for this or any previous visit (from the past 240 hour(s)).   Radiological Exams on Admission: CT ABDOMEN PELVIS W CONTRAST  Result Date: 07/13/2019 CLINICAL DATA:  Melena.  Sacral pressure sores.  Chronic back pain. EXAM: CT ABDOMEN AND PELVIS WITH CONTRAST TECHNIQUE: Multidetector CT imaging of the abdomen and pelvis was performed using the standard protocol following bolus administration of intravenous contrast. CONTRAST:  124mL OMNIPAQUE IOHEXOL 300 MG/ML  SOLN COMPARISON:  12/30/2015 FINDINGS: Lower chest: Bibasilar opacities dependently, likely atelectasis. Trace left pleural effusion. Heart is borderline in size. Hepatobiliary: No focal liver abnormality is seen. Status post cholecystectomy. No biliary dilatation. Pancreas: No focal abnormality or ductal dilatation. Spleen: No focal abnormality.  Normal size. Adrenals/Urinary Tract: No adrenal abnormality. No focal renal abnormality. No stones or hydronephrosis. Urinary bladder is unremarkable. Stomach/Bowel: Sigmoid diverticulosis. Moderate stool throughout the colon. No  evidence of bowel obstruction. Normal appendix. Stomach and small bowel unremarkable. Vascular/Lymphatic: Aortic atherosclerosis. No enlarged abdominal or pelvic lymph nodes. Reproductive: Prior hysterectomy.  No adnexal masses. Other: No free fluid or free air. Musculoskeletal: No acute bony abnormality. IMPRESSION: Sigmoid diverticulosis.  Moderate stool burden throughout the colon. Aortic atherosclerosis. No acute findings in the abdomen or pelvis. Trace left pleural effusion.  Bibasilar atelectasis. Electronically Signed   By: Rolm Baptise M.D.   On: 07/13/2019 13:48   DG Chest Port 1 View  Result Date: 07/13/2019 CLINICAL DATA:  Cough.  Recent positive COVID test EXAM: PORTABLE CHEST 1 VIEW COMPARISON:  03/20/2018 FINDINGS: Borderline heart size. Small left pleural effusion left base atelectasis. No confluent opacity on the right. No acute bony abnormality. IMPRESSION: Small left pleural effusion with left base atelectasis. Electronically Signed   By: Rolm Baptise M.D.   On: 07/13/2019 12:42   EKG: No EKG done on arrival to the emergency room so we will order one now.  Assessment/Plan Active Problems:   GIB (gastrointestinal bleeding)  Rectal bleeding in the setting of anticoagulant use likely from diverticulosis -Patient observation telemetry -Hold patient's apixaban at this time Plan continue to monitor for signs and symptoms of bleeding; patient was placed on a PPI IV this was changed to p.o. by gastroenterology -Gastroenterology is consulted for further evaluation recommendations and recommends clear liquid diet for now and laxatives for constipation- -repeating CBC in a.m. as her hemoglobin/hematocrit is relatively stable -If patient rebleeds then they recommend obtaining nuclear medicine RBC bleeding scan -Given 1 L of normal saline in the ED and placed on 75 mL's per hour and will continue for 1 day  -CT of the abdomen and pelvis showed "Sigmoid diverticulosis.  Moderate stool burden  throughout the colon. Aortic atherosclerosis.No acute findings in the abdomen or pelvis. Trace left pleural effusion.  Bibasilar atelectasis." -Prior colonoscopy showed that she had severe diverticulosis.   -Further care per Gastroenterology  Constipation -CT of the abdomen pelvis showed moderate stool burden -Started the patient on laxatives and will give MiraLAX 17 g twice daily as well as senna docusate 1 tab p.o. twice daily -We will add bisacodyl suppository if necessary  Lower back pain -Has a history of chronic back pain but granddaughter states that she fell out of her wheelchair onto her back next-obtained a thoracic spine x-ray as well as a lumbar spine x-ray -Consider MRI if not improving  Dementia -Has slight dementia but she is awake and alert and oriented x3 -Continue with all divalproex 125 mg p.o. nightly and donepezil  Diabetes mellitus type 2 -Continue gabapentin 300 mg daily for peripheral neuropathy -no longer taking linagliptin -Continue to monitor blood sugars carefully -Blood sugar admission was 147 and will continue monitor carefully  GERD -Continue with PPI as above  Hypertension -Continue with amlodipine 2.5 mg p.o. daily and with lisinopril 10 mg p.o. daily  Paroxysmal atrial fibrillation -Currently holding her apixaban 5 mg p.o. twice daily -Not on any beta blockade due to history of bradycardia  CAD and history of unstable angina -Continue lisinopril -Not complaining of any chest pain and not on any beta-blockers due to bradycardia  DVT prophylaxis: SCDs Code Status: DO NOT RESUSCITATE Family Communication: Discussed with granddaughter over the telephone Disposition Plan: Pending clinical improvement and advancement of diet Consults called: Gastroenterology Dr. Lyndel Safe Admission status: Observation telemetry  Severity of Illness: The appropriate patient status for this patient is OBSERVATION. Observation status is judged to be reasonable and  necessary in order to provide the required intensity of service to ensure the patient's safety. The patient's presenting symptoms, physical exam findings, and initial radiographic and laboratory data in the context of their medical condition is felt to place them at decreased risk for further clinical deterioration. Furthermore, it is anticipated that the patient will be medically stable for discharge from the hospital within 2 midnights of admission. The following factors support the patient status of observation.   " The patient's presenting symptoms include rectal bleeding. " The physical exam findings include positive FOBT and abdominal pain " The initial radiographic and laboratory data are relatively reassuring  Kerney Elbe, D.O. [facility] P[facility]ER is on [place]  If 7PM-7AM, please contact night-coverage [email or web address]  07/13/2019, 2:52 PM

## 2019-07-13 NOTE — ED Notes (Signed)
Patient informed RN that she cannot walk, will obtain lying and sitting orthostatics.

## 2019-07-13 NOTE — ED Notes (Signed)
Patient transported to X-ray 

## 2019-07-13 NOTE — ED Notes (Addendum)
Pt states she is unable to sit on the side of the bed or stand due to her back; therefore, unable to assess orthostatic vital signs. RN at bedside and aware.

## 2019-07-13 NOTE — Consult Note (Addendum)
[facility] Consult: 2:58 PM 07/13/2019  LOS: 0 days    Referring Provider: Dr Alfredia Ferguson  Primary Care Physician:  Chesley Noon, MD Primary Gastroenterologist:  Dr. Carlean Purl    Reason for Consultation:  Gi bleed, pt on Xarelto.     HPI: [name] is a 85 y.o. female.  Hx CKD 3.  DM 2.  Chronic Eliquis for chronic A fib.  CAD with previous DES 2005, angioplasty 2006.  Rheumatoid and osteoarthritis with chronic back pain.  09/2000 Colonoscopy.  Attempted study aborted at the sigmoid colon due to sigmoid diverticulosis associated stenosis at 20 cm.  Unable to pass standard or peds colonoscope.   10/2002 EGD for chest pain, reflux symptoms.  Mild reflux esophagitis. 04/2007 EGD for reflux symptoms, hoarseness.  Normal exam, Bravo pH probe placed. 11/2007 esophageal manometry for eval of occasional chest tightness, hoarseness.  Residual LES pressure slightly above normal.   01/2009 colonoscopy  For altered bowel habits.  Sigmoid diverticulosis associated with luminal stenosis and fixed portion of bowel which precluded advancement of the scope beyond the sigmoid.  Patient had Covid "a few weeks ago" but was not admitted to the hospital.   Came from [facility] this morning after developing frank clots of blood per rectum. Pt denies abdominal pain, N/V.  No previous history of GI bleeding.  Hgb 12.8 (10.7 in 02/2018).  MCV 94.  Platelets and white count normal.  BUN, creatinine normal.  FOBT positive.  COVID-19 negative. CTAP w contrast:   Sigmoid tics.  Moderate stool burden throughout colon.  Aortic atherosclerosis.  Trace left pleural effusion, bb ATX  Past Medical History:  Diagnosis Date  . 1st degree AV block   . Acute renal failure ([place])    due to dehydration-Sept 2009; Normal creat 1.2 on fu  05/13/2009  . Allergic rhinitis   . Anemia    NOS chronic disease. Hgb 11.6gm% 04/14/2009  . Bradycardia   . CAD (coronary artery disease)    a. s/p DES mLAD 2005. b. Cutting balloon angioplasty for ISR 2006. c. LHC 08/2010 nonobstructive. d. Myoview 06/2012: small anteroapical infarct/mod inferolat wall infarct but no ischemia, EF 75%.  . Cancer ([place])    on nose and had it removed  . Chronic anticoagulation   . CKD (chronic kidney disease), stage III   . Dementia    slight  . Diabetes mellitus type II   . Diverticulitis of colon   . DM neuropathy, type II diabetes mellitus ([place])   . GERD (gastroesophageal reflux disease)   . HTN (hypertension)   . Hx of colonoscopy   . Hyperlipidemia   . IBS (irritable bowel syndrome)   . Multinodular goiter   . Obesity    BMI-37  . Orthostatic hypotension   . Osteoarthritis    lower back  . Osteoporosis   . Pneumonia   . PVC's (premature ventricular contractions)    a. seen on telemetry 02/2016.  Marland Kitchen RBBB   . Rheumatoid arthritis ([place])   . Spinal stenosis    djd lumbar  Past Surgical History:  Procedure Laterality Date  . ABDOMINAL HYSTERECTOMY    . bilateral arthroscopic knee surgery    . carpel tunnel wrist rt    . CHOLECYSTECTOMY    . CORONARY ANGIOPLASTY     stent  . NASAL SINUS SURGERY     x2  . stent surgery      Prior to Admission medications   Medication Sig Start Date End Date Taking? Authorizing Provider  acetaminophen (TYLENOL) 500 MG tablet Take 1,000 mg by mouth 3 (three) times daily.   Yes [provider]  amLODipine (NORVASC) 2.5 MG tablet Take 2.5 mg by mouth daily. 06/16/19  Yes [provider]  apixaban (ELIQUIS) 5 MG TABS tablet Take 1 tablet (5 mg total) by mouth 2 (two) times daily. 01/31/15  Yes Weaver, Scott T, PA-C  cetirizine (ZYRTEC) 10 MG tablet Take 10 mg by mouth daily.   Yes [provider]  dextromethorphan-guaiFENesin (ROBITUSSIN-DM) 10-100 MG/5ML liquid Take 10 mLs by  mouth every 4 (four) hours as needed for cough.   Yes [provider]  diclofenac sodium (VOLTAREN) 1 % GEL Apply 2 g topically 4 (four) times daily. 10/22/17  Yes Vann, Jessica U, DO  divalproex (DEPAKOTE) 125 MG DR tablet Take 125 mg by mouth at bedtime.    Yes [provider]  donepezil (ARICEPT) 10 MG tablet Take 10 mg by mouth at bedtime.   Yes [provider]  fluticasone (FLONASE) 50 MCG/ACT nasal spray Place 2 sprays into both nostrils daily.   Yes [provider]  furosemide (LASIX) 40 MG tablet Take 40 mg by mouth daily. 07/09/19  Yes [provider]  gabapentin (NEURONTIN) 300 MG capsule Take 300 mg by mouth daily. 06/21/19  Yes [provider]  gabapentin (NEURONTIN) 600 MG tablet Take 600 mg by mouth every evening.   Yes [provider]  lisinopril (PRINIVIL,ZESTRIL) 10 MG tablet Take 10 mg by mouth daily.   Yes [provider]  LUMIGAN 0.01 % SOLN Place 1 drop into both eyes at bedtime. 02/11/17  Yes [provider]  Melatonin 10 MG TABS Take 10 mg by mouth at bedtime.   Yes [provider]  Menthol, Topical Analgesic, (BIOFREEZE) 4 % GEL Apply 1 application topically 2 (two) times daily.   Yes [provider]  nitroGLYCERIN (NITROSTAT) 0.4 MG SL tablet Place 1 tablet (0.4 mg total) under the tongue every 5 (five) minutes as needed for chest pain (Max 3 doses). 10/22/17  Yes Vann, Jessica U, DO  oxybutynin (DITROPAN) 5 MG tablet Take 5 mg by mouth 2 (two) times daily.    Yes [provider]  polyethylene glycol (MIRALAX / GLYCOLAX) packet Take 17 g by mouth daily as needed for mild constipation. 10/22/17  Yes Vann, Jessica U, DO  senna-docusate (SENOKOT-S) 8.6-50 MG tablet Take 1 tablet by mouth daily.    Yes [provider]  traMADol (ULTRAM) 50 MG tablet Take 25 mg by mouth every 12 (twelve) hours as needed for moderate pain (given half a tablet for pain).   Yes [provider]  traZODone (DESYREL) 150 MG tablet Take 75 mg by mouth at bedtime.    Yes [provider]  linagliptin (TRADJENTA) 5 MG TABS tablet Take 1 tablet (5 mg total) by mouth daily. Patient not taking: Reported on 07/13/2019 10/22/17   Geradine Girt, DO    Scheduled Meds: . sodium chloride (PF)       Infusions: .  sodium chloride    . pantoprozole (PROTONIX) infusion 8 mg/hr (07/13/19 1229)   PRN Meds:    Allergies as of 07/13/2019 - Review Complete 07/13/2019  Allergen Reaction Noted  . Amoxicillin Other (See Comments) 12/03/2007    Family History  Problem Relation Age of Onset  . Heart attack Father   . Breast cancer Sister   . Heart disease Brother   . Heart attack Son   . Heart attack Daughter   . Colon cancer Neg Hx   . Stroke Neg Hx     Social History   Socioeconomic History  . Marital status: Married    Spouse name: Not on file  . Number of children: Not on file  . Years of education: Not on file  . Highest education level: Not on file  Occupational History  . Occupation: retired  Tobacco Use  . Smoking status: Never Smoker  . Smokeless tobacco: Never Used  Substance and Sexual Activity  . Alcohol use: No  . Drug use: No  . Sexual activity: Not Currently    Birth control/protection: Post-menopausal  Other Topics Concern  . Not on file  Social History Narrative  . Not on file   Social Determinants of Health   Financial Resource Strain:   . Difficulty of Paying Living Expenses: Not on file  Food Insecurity:   . Worried About Charity fundraiser in the Last Year: Not on file  . Ran Out of Food in the Last Year: Not on file  Transportation Needs:   . Lack of Transportation (Medical): Not on file  . Lack of Transportation (Non-Medical): Not on file  Physical Activity:   . Days of Exercise per Week: Not on file  . Minutes of Exercise per Session: Not on file  Stress:   . Feeling of Stress : Not on file  Social Connections:   .  Frequency of Communication with Friends and Family: Not on file  . Frequency of Social Gatherings with Friends and Family: Not on file  . Attends Religious Services: Not on file  . Active Member of Clubs or Organizations: Not on file  . Attends Archivist Meetings: Not on file  . Marital Status: Not on file  Intimate Partner Violence:   . Fear of Current or Ex-Partner: Not on file  . Emotionally Abused: Not on file  . Physically Abused: Not on file  . Sexually Abused: Not on file    REVIEW OF SYSTEMS: Constitutional: Nonambulatory due to back pain. ENT:  No nose bleeds Pulm: No shortness of breath.  Nonproductive cough on occasion. CV:  No palpitations, no LE edema.  No chest pain. GU:  No hematuria, no frequency GI: See HPI. Heme: Other than the rectal bleeding, no reports of unusual or excessive bleeding. Transfusions: None per review of epic records and care everywhere. Neuro:  No headaches, no peripheral tingling or numbness.  No syncope.  No seizures. Derm: Sacral pressure ulcer. Endocrine:  No sweats or chills.  No polyuria or dysuria Immunization: Reviewed.  Old vaccinations are listed but nothing listed since 2016. Travel:  None beyond local counties in last few months.    PHYSICAL EXAM: Vital signs in last 24 hours: Vitals:   07/13/19 1400 07/13/19 1430  BP: (!) 136/59 (!) 148/63  Pulse: 66 78  Resp: 18 18  Temp:    SpO2: 97% 100%   Wt Readings from Last 3 Encounters:  03/20/18 72.6 kg  01/18/18 77.1 kg  10/22/17  78 kg    General: Obese, chronically unwell WF.  Looks younger than stated age of 19. Head: No facial asymmetry or swelling.  No signs of head trauma. Eyes: No scleral icterus.  No conjunctival pallor. Ears: Not hard of hearing. Nose: No congestion or discharge. Mouth: Moist, clear, pink oropharynx.  Tongue midline. Neck: No JVD, no masses, no thyromegaly. Lungs: No labored breathing or cough.  Lungs clear to auscultation in  front. Heart: Irregularly irregular, rate controlled.  No MRG.  S1, S2 present Abdomen: Soft.  Mild tenderness on the right abdomen without guarding or rebound.  Obese.  Bowel sounds active.  No HSM, masses, bruits, hernias..   Rectal: Did not repeat rectal exam.  On ED physicians DRE earlier there was no formed brown and maroon stool Musc/Skeltl: No joint redness, swelling or gross deformity. Extremities: None nonpitting lower extremity swelling. Neurologic: HOH.  Oriented to self, her year of birth, current year, place.  Follows commands.  No tremors.  Moves all 4 limbs, strength not tested. Skin:  No rash.   Tattoos:  None seen   Psych:  Pleasant, cooperative, calm.    Intake/Output from previous day: No intake/output data recorded. Intake/Output this shift: No intake/output data recorded.  LAB RESULTS: Recent Labs    07/13/19 1121  WBC 6.0  HGB 12.8  HCT 39.4  PLT 246   BMET Lab Results  Component Value Date   NA 138 07/13/2019   NA 141 03/20/2018   NA 139 01/18/2018   K 4.4 07/13/2019   K 4.4 03/20/2018   K 3.6 01/18/2018   CL 96 (L) 07/13/2019   CL 104 03/20/2018   CL 106 01/18/2018   CO2 30 07/13/2019   CO2 30 03/20/2018   CO2 26 01/18/2018   GLUCOSE 147 (H) 07/13/2019   GLUCOSE 109 (H) 03/20/2018   GLUCOSE 139 (H) 01/18/2018   BUN 17 07/13/2019   BUN 12 03/20/2018   BUN 8 01/18/2018   CREATININE 0.97 07/13/2019   CREATININE 0.87 03/20/2018   CREATININE 0.77 01/18/2018   CALCIUM 8.9 07/13/2019   CALCIUM 8.8 (L) 03/20/2018   CALCIUM 8.2 (L) 01/18/2018   LFT Recent Labs    07/13/19 1121  PROT 6.6  ALBUMIN 3.1*  AST 20  ALT 16  ALKPHOS 58  BILITOT 0.7   PT/INR Lab Results  Component Value Date   INR 1.4 (H) 07/13/2019   INR 1.10 02/17/2017   INR 1.27 10/24/2015   Hepatitis Panel No results for input(s): HEPBSAG, HCVAB, HEPAIGM, HEPBIGM in the last 72 hours. C-Diff No components found for: CDIFF Lipase     Component Value Date/Time    LIPASE 33 03/07/2016 1116    Drugs of Abuse  No results found for: LABOPIA, COCAINSCRNUR, LABBENZ, AMPHETMU, THCU, LABBARB   RADIOLOGY STUDIES: CT ABDOMEN PELVIS W CONTRAST  Result Date: 07/13/2019 CLINICAL DATA:  Melena.  Sacral pressure sores.  Chronic back pain. EXAM: CT ABDOMEN AND PELVIS WITH CONTRAST TECHNIQUE: Multidetector CT imaging of the abdomen and pelvis was performed using the standard protocol following bolus administration of intravenous contrast. CONTRAST:  153mL OMNIPAQUE IOHEXOL 300 MG/ML  SOLN COMPARISON:  12/30/2015 FINDINGS: Lower chest: Bibasilar opacities dependently, likely atelectasis. Trace left pleural effusion. Heart is borderline in size. Hepatobiliary: No focal liver abnormality is seen. Status post cholecystectomy. No biliary dilatation. Pancreas: No focal abnormality or ductal dilatation. Spleen: No focal abnormality.  Normal size. Adrenals/Urinary Tract: No adrenal abnormality. No focal renal abnormality. No stones or hydronephrosis. Urinary  bladder is unremarkable. Stomach/Bowel: Sigmoid diverticulosis. Moderate stool throughout the colon. No evidence of bowel obstruction. Normal appendix. Stomach and small bowel unremarkable. Vascular/Lymphatic: Aortic atherosclerosis. No enlarged abdominal or pelvic lymph nodes. Reproductive: Prior hysterectomy.  No adnexal masses. Other: No free fluid or free air. Musculoskeletal: No acute bony abnormality. IMPRESSION: Sigmoid diverticulosis.  Moderate stool burden throughout the colon. Aortic atherosclerosis. No acute findings in the abdomen or pelvis. Trace left pleural effusion.  Bibasilar atelectasis. Electronically Signed   By: Rolm Baptise M.D.   On: 07/13/2019 13:48   DG Chest Port 1 View  Result Date: 07/13/2019 CLINICAL DATA:  Cough.  Recent positive COVID test EXAM: PORTABLE CHEST 1 VIEW COMPARISON:  03/20/2018 FINDINGS: Borderline heart size. Small left pleural effusion left base atelectasis. No confluent opacity on the  right. No acute bony abnormality. IMPRESSION: Small left pleural effusion with left base atelectasis. Electronically Signed   By: Rolm Baptise M.D.   On: 07/13/2019 12:42     IMPRESSION:   *   LGIB with painless hematochezia.  Suspect diverticular bleed.  *   Chronic Eliquis for A fib.  On hold. Last dose was 07/13/19 at 0800.     *   Covid 19 positive a few weeks ago, Covid 19 POC testing negative today.     *   Hx GERD.  No PPI or H2 blocker in use per admission med list  *   Chronic back pain in pt w osteo and rheumatoid arthritis.  Uses topical voltaren but not oral NSAIDs.     PLAN:     *  Observation for now.  If re-bleeds, obtain nuc med RBC bleeding scan.   CBC in AM.    *  Stop Protonix drip.  For duration of admission will give Protonix 40 mg po/day.    *   Diet clear liquids.  Dr Alfredia Ferguson ordering laxatives.  CBC in AM.     Azucena Freed  07/13/2019, 2:58 PM Phone [phone]   Attending physician's note   I have taken an interval history, reviewed the chart and examined the patient. I agree with the Advanced Practitioner's note, impression and recommendations.   85yr old with Afib on Eliquis (last dose today),CKD3, DM2, CAD, RA, very HOH with painless hematochezia. Likely diverticular bleed.  Doubt UGI bleed (Nl BUN). similar to previous presentations 09/2000, 01/2009 (colonoscopy only successful up to 20 cm due to significant diverticular disease/diverticular stricture). HD stable. Hb 12.8. Neg CTA.  Plan: -Manage conservatively. -Trend Hb. -Hold Eliquis. -Hold off on colonoscopy since previous 2 attempts were unsuccessful. -Clear liquid diet.  Advance as tolerated, if there is no further bleeding. -If any active bleeding, RBC scan followed by mesenteric angio if positive.   Carmell Austria, MD Velora Heckler Fabienne Bruns [phone].

## 2019-07-13 NOTE — ED Triage Notes (Addendum)
Pt BIB EMS from [facility]. Facility reports patient had a blood clot coming out of patients rectum when cleaning her this morning. Facility reports patient has sacral pressure sores. Patient c/o chronic back pain, and pain from pressure sore. Pt denies N/V. Patient denies abdominal pain. Facility reports patient had COVID "a few weeks ago". Patient has not had a negative test since. EMS reports patient has been coughing. Patient alert and oriented to self, per facility, this is baseline.

## 2019-07-13 NOTE — ED Provider Notes (Signed)
[facility] DEPT Provider Note   CSN: [id number] Arrival date & time: 07/13/19  1056     History Chief Complaint  Patient presents with  . Rectal Bleeding    [name] is a 85 y.o. female.  Level 5 caveat for dementia.  Patient sent from facility this morning with rectal bleeding.  There is blood noted when they changed her diaper this morning.  Patient has chronic back pain on chronic pressure sore to her back is complaining of back pain but this is a chronic problem apparently.  She denies any abdominal pain, nausea, vomiting.  She did have Covid "a few weeks ago".  He has a persistent cough.  Patient cannot give much history.  She is oriented x1 at baseline. She has prescribed Eliquis for unclear indication.  She denies any dizziness or lightheadedness.  Denies abdominal pain, chest pain or shortness of breath.  No fevers or chills.  The history is provided by the patient and the EMS personnel. The history is limited by the condition of the patient.  Rectal Bleeding      Past Medical History:  Diagnosis Date  . 1st degree AV block   . Acute renal failure ([place])    due to dehydration-Sept 2009; Normal creat 1.2 on fu 05/13/2009  . Allergic rhinitis   . Anemia    NOS chronic disease. Hgb 11.6gm% 04/14/2009  . Bradycardia   . CAD (coronary artery disease)    a. s/p DES mLAD 2005. b. Cutting balloon angioplasty for ISR 2006. c. LHC 08/2010 nonobstructive. d. Myoview 06/2012: small anteroapical infarct/mod inferolat wall infarct but no ischemia, EF 75%.  . Cancer ([place])    on nose and had it removed  . Chronic anticoagulation   . CKD (chronic kidney disease), stage III   . Dementia    slight  . Diabetes mellitus type II   . Diverticulitis of colon   . DM neuropathy, type II diabetes mellitus ([place])   . GERD (gastroesophageal reflux disease)   . HTN (hypertension)   . Hx of colonoscopy   . Hyperlipidemia   . IBS (irritable bowel  syndrome)   . Multinodular goiter   . Obesity    BMI-37  . Orthostatic hypotension   . Osteoarthritis    lower back  . Osteoporosis   . Pneumonia   . PVC's (premature ventricular contractions)    a. seen on telemetry 02/2016.  Marland Kitchen RBBB   . Rheumatoid arthritis ([place])   . Spinal stenosis    djd lumbar    Patient Active Problem List   Diagnosis Date Noted  . Acute metabolic encephalopathy [id number]  . Syncope 02/17/2017  . Primary osteoarthritis of both knees 01/30/2017  . Primary osteoarthritis of left shoulder 12/10/2016  . Slow transit constipation 10/22/2016  . PVC's (premature ventricular contractions) 03/07/2016  . Depression 03/06/2016  . Chest pain at rest 03/06/2016  . Bilateral sensorineural hearing loss 01/17/2016  . Pressure sensation in right ear 01/17/2016  . Unstable angina ([place]) 10/24/2015  . Arthritis of right hip 09/07/2015  . DDD (degenerative disc disease), lumbar 09/07/2015  . Epidermoid cyst of labia majora 09/02/2015  . Chronic anticoagulation for A fib, CHA2DS2VASc = 6 12/18/2014  . Musculoskeletal chest pain 12/17/2014  . Diabetes mellitus type II, non insulin dependent ([place])   . HTN (hypertension)   . Chest pain 11/20/2011  . Tachycardia 08/07/2011  . Atrial fibrillation, permanent 06/25/2011  . Near Syncope 06/06/2011  . Coronary artery  disease 04/05/2011  . Bradycardia 12/09/2009  . ACUTE BRONCHITIS 06/17/2009  . COPD, moderate ([place]) 06/17/2009  . PULMONARY NODULE, RIGHT UPPER LOBE 06/07/2009  . SHORTNESS OF BREATH (SOB) 06/07/2009  . POSTURAL HYPOTENSION 05/23/2009  . DEHYDRATION 05/13/2009  . ABDOMINAL PAIN, LOWER 01/21/2009  . HOARSENESS, CHRONIC 12/03/2007  . GOITER 09/30/2007  . DIABETIC PERIPHERAL NEUROPATHY 09/30/2007  . OBESITY 09/30/2007  . HIATAL HERNIA 09/30/2007  . OSTEOARTHRITIS 09/30/2007  . SPINAL STENOSIS 09/30/2007  . PERIPHERAL EDEMA 09/30/2007  . HLD (hyperlipidemia) 02/12/2007  . ANEMIA-NOS 02/12/2007  . Neuropathy  ([place]) 02/12/2007  . Allergic rhinitis, cause unspecified 02/12/2007  . GERD 02/12/2007  . DIVERTICULOSIS, COLON 02/12/2007    Past Surgical History:  Procedure Laterality Date  . ABDOMINAL HYSTERECTOMY    . bilateral arthroscopic knee surgery    . carpel tunnel wrist rt    . CHOLECYSTECTOMY    . CORONARY ANGIOPLASTY     stent  . NASAL SINUS SURGERY     x2  . stent surgery       OB History   No obstetric history on file.     Family History  Problem Relation Age of Onset  . Heart attack Father   . Breast cancer Sister   . Heart disease Brother   . Heart attack Son   . Heart attack Daughter   . Colon cancer Neg Hx   . Stroke Neg Hx     Social History   Tobacco Use  . Smoking status: Never Smoker  . Smokeless tobacco: Never Used  Substance Use Topics  . Alcohol use: No  . Drug use: No    Home Medications Prior to Admission medications   Medication Sig Start Date End Date Taking? Authorizing Provider  acetaminophen (TYLENOL) 500 MG tablet Take 1,000 mg by mouth 3 (three) times daily.    [provider]  apixaban (ELIQUIS) 5 MG TABS tablet Take 1 tablet (5 mg total) by mouth 2 (two) times daily. 01/31/15   Richardson Dopp T, PA-C  atorvastatin (LIPITOR) 20 MG tablet Take 20 mg by mouth daily at 6 PM.     [provider]  Calcium Carbonate-Vitamin D (CALCIUM 500/D PO) Take 1 tablet by mouth 2 (two) times daily.     [provider]  diclofenac sodium (VOLTAREN) 1 % GEL Apply 2 g topically 4 (four) times daily. 10/22/17   Geradine Girt, DO  divalproex (DEPAKOTE) 125 MG DR tablet Take 125 mg by mouth 2 (two) times daily.    [provider]  donepezil (ARICEPT) 10 MG tablet Take 10 mg by mouth at bedtime.    [provider]  gabapentin (NEURONTIN) 600 MG tablet Take 300-600 mg by mouth 2 (two) times daily. Take 300 mg in the morning and 600 mg in the evening 01/06/18   [provider]  guaiFENesin 200 MG tablet Take 200 mg  by mouth every 6 (six) hours.    [provider]  latanoprost (XALATAN) 0.005 % ophthalmic solution INSTILL 1 DROP INTO BOTH EYES AT [place] GLAUCOMA 11/25/17   [provider]  linagliptin (TRADJENTA) 5 MG TABS tablet Take 1 tablet (5 mg total) by mouth daily. 10/22/17   Geradine Girt, DO  Liniments (SALONPAS EX) Apply 1 patch topically daily.    [provider]  lisinopril (PRINIVIL,ZESTRIL) 10 MG tablet Take 10 mg by mouth daily.    [provider]  loratadine (CLARITIN) 10 MG tablet Take 10 mg by mouth  daily.    [provider]  LUMIGAN 0.01 % SOLN Place 1 drop into both eyes at bedtime. 02/11/17   [provider]  Melatonin 10 MG TABS Take 10 mg by mouth at bedtime.    [provider]  Multiple Vitamins-Minerals (MULTIVITAMIN PO) Take 1 tablet by mouth daily.    [provider]  Multiple Vitamins-Minerals (PRESERVISION AREDS PO) Take 1 capsule by mouth 2 (two) times daily.    [provider]  nitroGLYCERIN (NITROSTAT) 0.4 MG SL tablet Place 1 tablet (0.4 mg total) under the tongue every 5 (five) minutes as needed for chest pain (Max 3 doses). 10/22/17   Geradine Girt, DO  oxybutynin (DITROPAN) 5 MG tablet Take 5 mg by mouth daily.    [provider]  polyethylene glycol (MIRALAX / GLYCOLAX) packet Take 17 g by mouth daily as needed for mild constipation. 10/22/17   Geradine Girt, DO  senna-docusate (SENOKOT-S) 8.6-50 MG tablet Take 1 tablet by mouth 2 (two) times daily.    [provider]  traZODone (DESYREL) 50 MG tablet Take 50 mg by mouth at bedtime.    [provider]    Allergies    Amoxicillin  Review of Systems   Review of Systems  Unable to perform ROS: Dementia  Gastrointestinal: Positive for hematochezia.    Physical Exam Updated Vital Signs BP 118/62   Pulse 72   Temp 97.9 F (36.6 C) (Oral)   Resp 18   SpO2 96%   Physical Exam Vitals and nursing note  reviewed.  Constitutional:      General: She is not in acute distress.    Appearance: She is well-developed. She is obese.  HENT:     Head: Normocephalic and atraumatic.     Mouth/Throat:     Pharynx: No oropharyngeal exudate.  Eyes:     Conjunctiva/sclera: Conjunctivae normal.     Pupils: Pupils are equal, round, and reactive to light.  Neck:     Comments: No meningismus. Cardiovascular:     Rate and Rhythm: Normal rate and regular rhythm.     Heart sounds: Normal heart sounds. No murmur.  Pulmonary:     Effort: Pulmonary effort is normal. No respiratory distress.     Breath sounds: Normal breath sounds.  Abdominal:     Palpations: Abdomen is soft.     Tenderness: There is no abdominal tenderness. There is no guarding or rebound.  Genitourinary:    Comments: Gross blood in diaper.  There is brown and maroon stool that is well formed. Musculoskeletal:        General: No tenderness. Normal range of motion.     Cervical back: Normal range of motion and neck supple.  Skin:    General: Skin is warm.  Neurological:     Mental Status: She is alert.     Cranial Nerves: No cranial nerve deficit.     Motor: No abnormal muscle tone.     Coordination: Coordination normal.     Comments:  5/5 strength throughout. CN 2-12 intact.Equal grip strength.  Oriented to person only.    Psychiatric:        Behavior: Behavior normal.     ED Results / Procedures / Treatments   Labs (all labs ordered are listed, but only abnormal results are displayed) Labs Reviewed  COMPREHENSIVE METABOLIC PANEL - Abnormal; Notable for the following components:      Result Value   Chloride 96 (*)    Glucose,  Bld 147 (*)    Albumin 3.1 (*)    GFR calc non Af Amer 50 (*)    GFR calc Af Amer 58 (*)    All other components within normal limits  PROTIME-INR - Abnormal; Notable for the following components:   Prothrombin Time 17.4 (*)    INR 1.4 (*)    All other components within normal limits  POC OCCULT  BLOOD, ED - Abnormal; Notable for the following components:   Fecal Occult Bld POSITIVE (*)    All other components within normal limits  SARS CORONAVIRUS 2 (TAT 6-24 HRS)  CBC WITH DIFFERENTIAL/PLATELET  LACTIC ACID, PLASMA  LACTIC ACID, PLASMA  POC SARS CORONAVIRUS 2 AG -  ED  TYPE AND SCREEN  ABO/RH    EKG None  Radiology CT ABDOMEN PELVIS W CONTRAST  Result Date: 07/13/2019 CLINICAL DATA:  Melena.  Sacral pressure sores.  Chronic back pain. EXAM: CT ABDOMEN AND PELVIS WITH CONTRAST TECHNIQUE: Multidetector CT imaging of the abdomen and pelvis was performed using the standard protocol following bolus administration of intravenous contrast. CONTRAST:  130mL OMNIPAQUE IOHEXOL 300 MG/ML  SOLN COMPARISON:  12/30/2015 FINDINGS: Lower chest: Bibasilar opacities dependently, likely atelectasis. Trace left pleural effusion. Heart is borderline in size. Hepatobiliary: No focal liver abnormality is seen. Status post cholecystectomy. No biliary dilatation. Pancreas: No focal abnormality or ductal dilatation. Spleen: No focal abnormality.  Normal size. Adrenals/Urinary Tract: No adrenal abnormality. No focal renal abnormality. No stones or hydronephrosis. Urinary bladder is unremarkable. Stomach/Bowel: Sigmoid diverticulosis. Moderate stool throughout the colon. No evidence of bowel obstruction. Normal appendix. Stomach and small bowel unremarkable. Vascular/Lymphatic: Aortic atherosclerosis. No enlarged abdominal or pelvic lymph nodes. Reproductive: Prior hysterectomy.  No adnexal masses. Other: No free fluid or free air. Musculoskeletal: No acute bony abnormality. IMPRESSION: Sigmoid diverticulosis.  Moderate stool burden throughout the colon. Aortic atherosclerosis. No acute findings in the abdomen or pelvis. Trace left pleural effusion.  Bibasilar atelectasis. Electronically Signed   By: Rolm Baptise M.D.   On: 07/13/2019 13:48   DG Chest Port 1 View  Result Date: 07/13/2019 CLINICAL DATA:  Cough.   Recent positive COVID test EXAM: PORTABLE CHEST 1 VIEW COMPARISON:  03/20/2018 FINDINGS: Borderline heart size. Small left pleural effusion left base atelectasis. No confluent opacity on the right. No acute bony abnormality. IMPRESSION: Small left pleural effusion with left base atelectasis. Electronically Signed   By: Rolm Baptise M.D.   On: 07/13/2019 12:42    Procedures Procedures (including critical care time)  Medications Ordered in ED Medications  sodium chloride 0.9 % bolus 1,000 mL (has no administration in time range)    And  0.9 %  sodium chloride infusion (has no administration in time range)  pantoprazole (PROTONIX) 80 mg in sodium chloride 0.9 % 100 mL IVPB (has no administration in time range)  pantoprazole (PROTONIX) 80 mg in sodium chloride 0.9 % 250 mL (0.32 mg/mL) infusion (has no administration in time range)    ED Course  I have reviewed the triage vital signs and the nursing notes.  Pertinent labs & imaging results that were available during my care of the patient were reviewed by me and considered in my medical decision making (see chart for details).    MDM Rules/Calculators/A&P                     Patient here from facility with rectal bleeding.  She is anticoagulated.  She denies any pain other than her chronic  back pain.  She does take anticoagulation.  Vitals are stable.  She does have a DNR in place.  Discussed with patient's power of attorney and granddaughter Elbert Ewings.  [phone].  Patient is DNR and DNI.  She is agreeable to blood transfusion if necessary.  Patient did have colonoscopy in 2010 which showed severe diverticulosis.  Hemoccult is positive.  Hemoglobin is 12.8.  Blood pressure 118/62.  Patient claims of her chronic back pain abdominal pain or rectal pain.  CT is reassuring.  Does show evidence of diverticulosis which patient had on colonoscopy 10 years ago.  No acute findings.  Hemodynamics remained stable.  Blood pressure [id number]  systolic.  Heart rate 60s and 70s.  Patient continues to have some intermittent bleeding.  Suspect this is diverticular in nature.  She will be observed overnight given her use of anticoagulation which will be held.  No need for blood transfusion at this time.  Admission discussed with Dr. Alfredia Ferguson.   [name] was evaluated in Emergency Department on 07/13/2019 for the symptoms described in the history of present illness. She was evaluated in the context of the global COVID-19 pandemic, which necessitated consideration that the patient might be at risk for infection with the SARS-CoV-2 virus that causes COVID-19. Institutional protocols and algorithms that pertain to the evaluation of patients at risk for COVID-19 are in a state of rapid change based on information released by regulatory bodies including the CDC and federal and state organizations. These policies and algorithms were followed during the patient's care in the ED.  Final Clinical Impression(s) / ED Diagnoses Final diagnoses:  Weakness  Rectal bleeding    Rx / DC Orders ED Discharge Orders    None       Travonte Byard, Annie Main, MD 07/13/19 1454

## 2019-07-14 ENCOUNTER — Encounter (HOSPITAL_COMMUNITY): Payer: Self-pay | Admitting: Internal Medicine

## 2019-07-14 DIAGNOSIS — I700 Atherosclerosis of aorta: Secondary | ICD-10-CM | POA: Diagnosis present

## 2019-07-14 DIAGNOSIS — K649 Unspecified hemorrhoids: Secondary | ICD-10-CM | POA: Diagnosis present

## 2019-07-14 DIAGNOSIS — Z66 Do not resuscitate: Secondary | ICD-10-CM | POA: Diagnosis present

## 2019-07-14 DIAGNOSIS — K922 Gastrointestinal hemorrhage, unspecified: Secondary | ICD-10-CM | POA: Diagnosis present

## 2019-07-14 DIAGNOSIS — E669 Obesity, unspecified: Secondary | ICD-10-CM | POA: Diagnosis present

## 2019-07-14 DIAGNOSIS — Z20828 Contact with and (suspected) exposure to other viral communicable diseases: Secondary | ICD-10-CM | POA: Diagnosis present

## 2019-07-14 DIAGNOSIS — Z880 Allergy status to penicillin: Secondary | ICD-10-CM | POA: Diagnosis not present

## 2019-07-14 DIAGNOSIS — K5900 Constipation, unspecified: Secondary | ICD-10-CM | POA: Diagnosis present

## 2019-07-14 DIAGNOSIS — Z79899 Other long term (current) drug therapy: Secondary | ICD-10-CM | POA: Diagnosis not present

## 2019-07-14 DIAGNOSIS — I2510 Atherosclerotic heart disease of native coronary artery without angina pectoris: Secondary | ICD-10-CM | POA: Diagnosis present

## 2019-07-14 DIAGNOSIS — F0390 Unspecified dementia without behavioral disturbance: Secondary | ICD-10-CM | POA: Diagnosis present

## 2019-07-14 DIAGNOSIS — K5731 Diverticulosis of large intestine without perforation or abscess with bleeding: Secondary | ICD-10-CM | POA: Diagnosis present

## 2019-07-14 DIAGNOSIS — G8929 Other chronic pain: Secondary | ICD-10-CM | POA: Diagnosis present

## 2019-07-14 DIAGNOSIS — L89152 Pressure ulcer of sacral region, stage 2: Secondary | ICD-10-CM | POA: Diagnosis present

## 2019-07-14 DIAGNOSIS — N183 Chronic kidney disease, stage 3 unspecified: Secondary | ICD-10-CM | POA: Diagnosis present

## 2019-07-14 DIAGNOSIS — K625 Hemorrhage of anus and rectum: Secondary | ICD-10-CM | POA: Diagnosis present

## 2019-07-14 DIAGNOSIS — M2578 Osteophyte, vertebrae: Secondary | ICD-10-CM | POA: Diagnosis present

## 2019-07-14 DIAGNOSIS — M545 Low back pain: Secondary | ICD-10-CM | POA: Diagnosis present

## 2019-07-14 DIAGNOSIS — E1122 Type 2 diabetes mellitus with diabetic chronic kidney disease: Secondary | ICD-10-CM | POA: Diagnosis present

## 2019-07-14 DIAGNOSIS — E785 Hyperlipidemia, unspecified: Secondary | ICD-10-CM | POA: Diagnosis present

## 2019-07-14 DIAGNOSIS — I440 Atrioventricular block, first degree: Secondary | ICD-10-CM | POA: Diagnosis present

## 2019-07-14 DIAGNOSIS — I480 Paroxysmal atrial fibrillation: Secondary | ICD-10-CM | POA: Diagnosis present

## 2019-07-14 DIAGNOSIS — E1142 Type 2 diabetes mellitus with diabetic polyneuropathy: Secondary | ICD-10-CM | POA: Diagnosis present

## 2019-07-14 DIAGNOSIS — J9811 Atelectasis: Secondary | ICD-10-CM | POA: Diagnosis present

## 2019-07-14 DIAGNOSIS — I129 Hypertensive chronic kidney disease with stage 1 through stage 4 chronic kidney disease, or unspecified chronic kidney disease: Secondary | ICD-10-CM | POA: Diagnosis present

## 2019-07-14 DIAGNOSIS — Z6837 Body mass index (BMI) 37.0-37.9, adult: Secondary | ICD-10-CM | POA: Diagnosis not present

## 2019-07-14 LAB — CBC
HCT: 38.8 % (ref 36.0–46.0)
Hemoglobin: 12.8 g/dL (ref 12.0–15.0)
MCH: 30.8 pg (ref 26.0–34.0)
MCHC: 33 g/dL (ref 30.0–36.0)
MCV: 93.3 fL (ref 80.0–100.0)
Platelets: 220 10*3/uL (ref 150–400)
RBC: 4.16 MIL/uL (ref 3.87–5.11)
RDW: 14 % (ref 11.5–15.5)
WBC: 8.2 10*3/uL (ref 4.0–10.5)
nRBC: 0 % (ref 0.0–0.2)

## 2019-07-14 LAB — COMPREHENSIVE METABOLIC PANEL
ALT: 17 U/L (ref 0–44)
AST: 19 U/L (ref 15–41)
Albumin: 3.5 g/dL (ref 3.5–5.0)
Alkaline Phosphatase: 75 U/L (ref 38–126)
Anion gap: 16 — ABNORMAL HIGH (ref 5–15)
BUN: 14 mg/dL (ref 8–23)
CO2: 24 mmol/L (ref 22–32)
Calcium: 8.8 mg/dL — ABNORMAL LOW (ref 8.9–10.3)
Chloride: 97 mmol/L — ABNORMAL LOW (ref 98–111)
Creatinine, Ser: 0.76 mg/dL (ref 0.44–1.00)
GFR calc Af Amer: >60 mL/min (ref >60)
GFR calc non Af Amer: >60 mL/min (ref >60)
Glucose, Bld: 146 mg/dL — ABNORMAL HIGH (ref 70–99)
Potassium: 3.6 mmol/L (ref 3.5–5.1)
Sodium: 137 mmol/L (ref 135–145)
Total Bilirubin: 1.5 mg/dL — ABNORMAL HIGH (ref 0.3–1.2)
Total Protein: 7.3 g/dL (ref 6.5–8.1)

## 2019-07-14 LAB — HEMOGLOBIN A1C
Hgb A1c MFr Bld: 6.3 % — ABNORMAL HIGH (ref 4.8–5.6)
Mean Plasma Glucose: 134.11 mg/dL

## 2019-07-14 LAB — CBG MONITORING, ED: Glucose-Capillary: 193 mg/dL — ABNORMAL HIGH (ref 70–99)

## 2019-07-14 MED ORDER — HYDROCORTISONE (PERIANAL) 2.5 % EX CREA
TOPICAL_CREAM | Freq: Two times a day (BID) | CUTANEOUS | Status: DC
  Administered 2019-07-14: 1 via RECTAL
  Filled 2019-07-14 (×2): qty 28.35

## 2019-07-14 NOTE — ED Notes (Signed)
Pt provided with break fast tray. Pt repositioned in bed.

## 2019-07-14 NOTE — Progress Notes (Addendum)
Progress Note    [name]  [id number] DOB: Apr 18, 1924  DOA: 07/13/2019 PCP: Chesley Noon, MD      Brief Narrative:    Medical records reviewed and are as summarized below: [name] is an 85 y.o. female with medical history significant for first-degree AV block, history of acute renal failure, allergic rhinitis, CAD status post drug-eluting stent to mid LAD, CKD stage III, type 2 diabetes mellitus with peripheral neuropathy, hypertension, hyperlipidemia, obesity, dementia, atrial fibrillation on Eliquis.  She presented to the hospital because of rectal bleeding.  Apparently, blood was noticed in her diaper this morning they changed her diaper at the nursing facility.  She was evaluated by the gastroenterologist.  Her hemoglobin has remained stable thus far, gastroenterologist recommended conservative management.  The etiology of GI bleed is not clear but differential diagnosis includes but not limited to diverticular bleeding and hemorrhoidal bleeding.      Assessment/Plan:   Active Problems:   GIB (gastrointestinal bleeding)   There is no height or weight on file to calculate BMI.   Lower GI bleeding: Differential diagnosis include but not limited to diverticular bleeding and hemorrhoidal bleeding.  Patient was seen by the gastroenterologist and conservative management was recommended.  Continue Protonix.  No plan for endoscopic work-up.  Hemoglobin is stable and there is no anemia.  Repeat H&H tomorrow and if no drop in hemoglobin, patient can be discharged back to SNF.  Paroxysmal atrial fibrillation/CAD: Eliquis on hold.  Eliquis may be resumed if no further bleeding overnight.  Hypertension: Continue antihypertensives  GERD: Continue Protonix  Type 2 diabetes mellitus with peripheral neuropathy: Continue gabapentin.  Patient is no longer on oral hypoglycemics.  Dementia: Continue Aricept.  Supportive care.  Stage II sacral decubitus ulcer:  Present on admission.  Local wound care.  Advanced age.  She is 85 years old and is at high risk for decompensation   Family Communication/Anticipated D/C date and plan/Code Status   DVT prophylaxis: SCDs Code Status: DNR Family Communication: Plan discussed with the patient Disposition Plan: Possible discharge to home tomorrow      Subjective:   No dizziness, shortness of breath or chest pain.    Objective:    Vitals:   07/14/19 1221 07/14/19 1230 07/14/19 1300 07/14/19 1330  BP: (!) 143/94 (!) 148/70 (!) 152/64 (!) 162/70  Pulse:  81 87 76  Resp:  (!) 25 17 (!) 22  Temp:      TempSrc:      SpO2:  98% 100% 100%   No intake or output data in the 24 hours ending 07/14/19 1423 There were no vitals filed for this visit.  Exam:  GEN: NAD SKIN: Stage II sacral decubitus ulcer EYES: EOMI ENT: MMM, hard of hearing CV: RRR PULM: CTA B ABD: soft, ND, NT, +BS.  At time of my visit, patient had moved her bowels and her stools in her brief was yellowish and I did not see any frank blood CNS: AAO x 3, non focal EXT: No edema or tenderness   Data Reviewed:   I have personally reviewed following labs and imaging studies:  Labs: Labs show the following:   Basic Metabolic Panel: Recent Labs  Lab 07/13/19 1121 07/14/19 0636  NA 138 137  K 4.4 3.6  CL 96* 97*  CO2 30 24  GLUCOSE 147* 146*  BUN 17 14  CREATININE 0.97 0.76  CALCIUM 8.9 8.8*   GFR CrCl cannot be calculated (Unknown  ideal weight.). Liver Function Tests: Recent Labs  Lab 07/13/19 1121 07/14/19 0636  AST 20 19  ALT 16 17  ALKPHOS 58 75  BILITOT 0.7 1.5*  PROT 6.6 7.3  ALBUMIN 3.1* 3.5   No results for input(s): LIPASE, AMYLASE in the last 168 hours. No results for input(s): AMMONIA in the last 168 hours. Coagulation profile Recent Labs  Lab 07/13/19 1121  INR 1.4*    CBC: Recent Labs  Lab 07/13/19 1121 07/14/19 0636  WBC 6.0 8.2  NEUTROABS 3.9  --   HGB 12.8 12.8  HCT 39.4  38.8  MCV 94.7 93.3  PLT 246 220   Cardiac Enzymes: No results for input(s): CKTOTAL, CKMB, CKMBINDEX, TROPONINI in the last 168 hours. BNP (last 3 results) No results for input(s): PROBNP in the last 8760 hours. CBG: Recent Labs  Lab 07/14/19 0920  GLUCAP 193*   D-Dimer: No results for input(s): DDIMER in the last 72 hours. Hgb A1c: Recent Labs    07/14/19 0636  HGBA1C 6.3*   Lipid Profile: No results for input(s): CHOL, HDL, LDLCALC, TRIG, CHOLHDL, LDLDIRECT in the last 72 hours. Thyroid function studies: No results for input(s): TSH, T4TOTAL, T3FREE, THYROIDAB in the last 72 hours.  Invalid input(s): FREET3 Anemia work up: No results for input(s): VITAMINB12, FOLATE, FERRITIN, TIBC, IRON, RETICCTPCT in the last 72 hours. Sepsis Labs: Recent Labs  Lab 07/13/19 1121 07/13/19 1122 07/14/19 0636  WBC 6.0  --  8.2  LATICACIDVEN  --  1.0  --     Microbiology Recent Results (from the past 240 hour(s))  SARS CORONAVIRUS 2 (TAT 6-24 HRS) Nasopharyngeal Nasopharyngeal Swab     Status: None   Collection Time: 07/13/19 12:15 PM   Specimen: Nasopharyngeal Swab  Result Value Ref Range Status   SARS Coronavirus 2 NEGATIVE NEGATIVE Final    Comment: (NOTE) SARS-CoV-2 target nucleic acids are NOT DETECTED. The SARS-CoV-2 RNA is generally detectable in upper and lower respiratory specimens during the acute phase of infection. Negative results do not preclude SARS-CoV-2 infection, do not rule out co-infections with other pathogens, and should not be used as the sole basis for treatment or other patient management decisions. Negative results must be combined with clinical observations, patient history, and epidemiological information. The expected result is Negative. Fact Sheet for Patients: SugarRoll.be Fact Sheet for Healthcare Providers: https://www.woods-mathews.com/ This test is not yet approved or cleared by the [place]  FDA and  has been authorized for detection and/or diagnosis of SARS-CoV-2 by FDA under an Emergency Use Authorization (EUA). This EUA will remain  in effect (meaning this test can be used) for the duration of the COVID-19 declaration under Section 56 4(b)(1) of the Act, 21 U.S.C. section 360bbb-3(b)(1), unless the authorization is terminated or revoked sooner. Performed at [facility], [place] [address], [place], [place] [address]     Procedures and diagnostic studies:  DG Thoracic Spine 2 View  Result Date: 07/13/2019 CLINICAL DATA:  Fall EXAM: THORACIC SPINE 2 VIEWS COMPARISON:  08/11/2016 FINDINGS: Limited visualization due to cross-table positioning. No acute fracture. IMPRESSION: No acute abnormality of the thoracic spine. Electronically Signed   By: Ulyses Jarred M.D.   On: 07/13/2019 23:07   DG Lumbar Spine 2-3 Views  Result Date: 07/13/2019 CLINICAL DATA:  Fall with back pain EXAM: LUMBAR SPINE - 2-3 VIEW COMPARISON:  09/21/2012 FINDINGS: There is no evidence of lumbar spine fracture. Alignment is normal. Multilevel degenerative change is consistent compared to the prior study.  Progression of lateral osteophyte at the L2-3 level. IMPRESSION: No acute abnormality of the lumbar spine. Electronically Signed   By: Ulyses Jarred M.D.   On: 07/13/2019 23:05   CT ABDOMEN PELVIS W CONTRAST  Result Date: 07/13/2019 CLINICAL DATA:  Melena.  Sacral pressure sores.  Chronic back pain. EXAM: CT ABDOMEN AND PELVIS WITH CONTRAST TECHNIQUE: Multidetector CT imaging of the abdomen and pelvis was performed using the standard protocol following bolus administration of intravenous contrast. CONTRAST:  169mL OMNIPAQUE IOHEXOL 300 MG/ML  SOLN COMPARISON:  12/30/2015 FINDINGS: Lower chest: Bibasilar opacities dependently, likely atelectasis. Trace left pleural effusion. Heart is borderline in size. Hepatobiliary: No focal liver abnormality is seen. Status post cholecystectomy. No biliary dilatation.  Pancreas: No focal abnormality or ductal dilatation. Spleen: No focal abnormality.  Normal size. Adrenals/Urinary Tract: No adrenal abnormality. No focal renal abnormality. No stones or hydronephrosis. Urinary bladder is unremarkable. Stomach/Bowel: Sigmoid diverticulosis. Moderate stool throughout the colon. No evidence of bowel obstruction. Normal appendix. Stomach and small bowel unremarkable. Vascular/Lymphatic: Aortic atherosclerosis. No enlarged abdominal or pelvic lymph nodes. Reproductive: Prior hysterectomy.  No adnexal masses. Other: No free fluid or free air. Musculoskeletal: No acute bony abnormality. IMPRESSION: Sigmoid diverticulosis.  Moderate stool burden throughout the colon. Aortic atherosclerosis. No acute findings in the abdomen or pelvis. Trace left pleural effusion.  Bibasilar atelectasis. Electronically Signed   By: Rolm Baptise M.D.   On: 07/13/2019 13:48   DG Chest Port 1 View  Result Date: 07/13/2019 CLINICAL DATA:  Cough.  Recent positive COVID test EXAM: PORTABLE CHEST 1 VIEW COMPARISON:  03/20/2018 FINDINGS: Borderline heart size. Small left pleural effusion left base atelectasis. No confluent opacity on the right. No acute bony abnormality. IMPRESSION: Small left pleural effusion with left base atelectasis. Electronically Signed   By: Rolm Baptise M.D.   On: 07/13/2019 12:42    Medications:   . amLODipine  2.5 mg Oral Daily  . divalproex  125 mg Oral QHS  . donepezil  10 mg Oral QHS  . fluticasone  2 spray Each Nare Daily  . gabapentin  300 mg Oral Daily  . gabapentin  600 mg Oral QPM  . hydrocortisone   Rectal BID  . latanoprost  1 drop Both Eyes QHS  . lisinopril  10 mg Oral Daily  . loratadine  10 mg Oral Daily  . oxybutynin  5 mg Oral BID  . polyethylene glycol  17 g Oral BID  . senna-docusate  1 tablet Oral BID  . traZODone  75 mg Oral QHS   Continuous Infusions: . sodium chloride Stopped (07/14/19 0500)     LOS: 0 days   Loyda Costin  [facility]   *Please refer to [email or web address], password [email or web address] to get updated schedule on who will round on this patient, as hospitalists switch teams weekly. If 7PM-7AM, please contact night-coverage at [email or web address], password [email or web address] for any overnight needs.  07/14/2019, 2:23 PM

## 2019-07-14 NOTE — ED Notes (Addendum)
Pt had large BM. Large formed BM passed with large amounts of loose, soft stool after. Stool was streaked with bright red blood. Minimal blood noted.  Pt cleaned, peri care performed. Linens changed. Pt repositioned in bed. Warm blankets provided. New purewick applied.

## 2019-07-14 NOTE — Progress Notes (Addendum)
Progress Note    ASSESSMENT AND PLAN:   85 yo female with painless rectal bleeding on Eliquis. Suspect diverticular bleed vrs hemorrhoidal. Had large BM today with minimal blood noted. Her hgb has remained stable overnight at 12.8.  --Will add Hydrocortisone Cream for ? Hemorrhoidal bleeding --No plans for colonoscopy. GI will sign off. Call for any questions.     SUBJECTIVE    Minimal blood in stool today   OBJECTIVE:     Vital signs in last 24 hours: Pulse Rate:  [60-145] 81 (12/22 1230) Resp:  [10-27] 25 (12/22 1230) BP: (95-180)/(55-156) 148/70 (12/22 1230) SpO2:  [75 %-100 %] 98 % (12/22 1230)   General:   in NAD Heart:  Regular rate  Pulm: Normal respiratory effort   Abdomen:  Soft, nondistended, nontender.  Normal bowel sounds.           Intake/Output from previous day: No intake/output data recorded. Intake/Output this shift: No intake/output data recorded.  Lab Results: Recent Labs    07/13/19 1121 07/14/19 0636  WBC 6.0 8.2  HGB 12.8 12.8  HCT 39.4 38.8  PLT 246 220   BMET Recent Labs    07/13/19 1121 07/14/19 0636  NA 138 137  K 4.4 3.6  CL 96* 97*  CO2 30 24  GLUCOSE 147* 146*  BUN 17 14  CREATININE 0.97 0.76  CALCIUM 8.9 8.8*   LFT Recent Labs    07/14/19 0636  PROT 7.3  ALBUMIN 3.5  AST 19  ALT 17  ALKPHOS 75  BILITOT 1.5*   PT/INR Recent Labs    07/13/19 1121  LABPROT 17.4*  INR 1.4*   Hepatitis Panel No results for input(s): HEPBSAG, HCVAB, HEPAIGM, HEPBIGM in the last 72 hours.  DG Thoracic Spine 2 View  Result Date: 07/13/2019 CLINICAL DATA:  Fall EXAM: THORACIC SPINE 2 VIEWS COMPARISON:  08/11/2016 FINDINGS: Limited visualization due to cross-table positioning. No acute fracture. IMPRESSION: No acute abnormality of the thoracic spine. Electronically Signed   By: Ulyses Jarred M.D.   On: 07/13/2019 23:07   DG Lumbar Spine 2-3 Views  Result Date: 07/13/2019 CLINICAL DATA:  Fall with back pain EXAM:  LUMBAR SPINE - 2-3 VIEW COMPARISON:  09/21/2012 FINDINGS: There is no evidence of lumbar spine fracture. Alignment is normal. Multilevel degenerative change is consistent compared to the prior study. Progression of lateral osteophyte at the L2-3 level. IMPRESSION: No acute abnormality of the lumbar spine. Electronically Signed   By: Ulyses Jarred M.D.   On: 07/13/2019 23:05   CT ABDOMEN PELVIS W CONTRAST  Result Date: 07/13/2019 CLINICAL DATA:  Melena.  Sacral pressure sores.  Chronic back pain. EXAM: CT ABDOMEN AND PELVIS WITH CONTRAST TECHNIQUE: Multidetector CT imaging of the abdomen and pelvis was performed using the standard protocol following bolus administration of intravenous contrast. CONTRAST:  169mL OMNIPAQUE IOHEXOL 300 MG/ML  SOLN COMPARISON:  12/30/2015 FINDINGS: Lower chest: Bibasilar opacities dependently, likely atelectasis. Trace left pleural effusion. Heart is borderline in size. Hepatobiliary: No focal liver abnormality is seen. Status post cholecystectomy. No biliary dilatation. Pancreas: No focal abnormality or ductal dilatation. Spleen: No focal abnormality.  Normal size. Adrenals/Urinary Tract: No adrenal abnormality. No focal renal abnormality. No stones or hydronephrosis. Urinary bladder is unremarkable. Stomach/Bowel: Sigmoid diverticulosis. Moderate stool throughout the colon. No evidence of bowel obstruction. Normal appendix. Stomach and small bowel unremarkable. Vascular/Lymphatic: Aortic atherosclerosis. No enlarged abdominal or pelvic lymph nodes. Reproductive: Prior hysterectomy.  No adnexal masses. Other: No free  fluid or free air. Musculoskeletal: No acute bony abnormality. IMPRESSION: Sigmoid diverticulosis.  Moderate stool burden throughout the colon. Aortic atherosclerosis. No acute findings in the abdomen or pelvis. Trace left pleural effusion.  Bibasilar atelectasis. Electronically Signed   By: Rolm Baptise M.D.   On: 07/13/2019 13:48   DG Chest Port 1 View  Result  Date: 07/13/2019 CLINICAL DATA:  Cough.  Recent positive COVID test EXAM: PORTABLE CHEST 1 VIEW COMPARISON:  03/20/2018 FINDINGS: Borderline heart size. Small left pleural effusion left base atelectasis. No confluent opacity on the right. No acute bony abnormality. IMPRESSION: Small left pleural effusion with left base atelectasis. Electronically Signed   By: Rolm Baptise M.D.   On: 07/13/2019 12:42      Active Problems:   GIB (gastrointestinal bleeding)     LOS: 0 days   Tye Savoy ,NP 07/14/2019, 1:22 PM    Attending physician's note   I have taken an interval history, reviewed the chart and examined the patient. I agree with the Advanced Practitioner's note, impression and recommendations.   Had a large bowel movement with only streaks of blood. No frank blood/melena. Hemoglobin has been stable Previous colonoscopy x 2 have been unsuccessful. Sounds more like hemorrhoidal bleeding  Plan: -Trend Hb -Trial of HC PR for hemorrhoidal bleeding. -No GI intervention planned. -We will sign off for now. -Can resume Eliquis if needed.   Carmell Austria, MD Velora Heckler Fabienne Bruns [phone].

## 2019-07-15 LAB — CBC WITH DIFFERENTIAL/PLATELET
Abs Immature Granulocytes: 0.03 10*3/uL (ref 0.00–0.07)
Basophils Absolute: 0 10*3/uL (ref 0.0–0.1)
Basophils Relative: 1 %
Eosinophils Absolute: 0.1 10*3/uL (ref 0.0–0.5)
Eosinophils Relative: 2 %
HCT: 38.1 % (ref 36.0–46.0)
Hemoglobin: 12.2 g/dL (ref 12.0–15.0)
Immature Granulocytes: 1 %
Lymphocytes Relative: 31 %
Lymphs Abs: 1.8 10*3/uL (ref 0.7–4.0)
MCH: 30.6 pg (ref 26.0–34.0)
MCHC: 32 g/dL (ref 30.0–36.0)
MCV: 95.5 fL (ref 80.0–100.0)
Monocytes Absolute: 0.6 10*3/uL (ref 0.1–1.0)
Monocytes Relative: 11 %
Neutro Abs: 3.2 10*3/uL (ref 1.7–7.7)
Neutrophils Relative %: 54 %
Platelets: 236 10*3/uL (ref 150–400)
RBC: 3.99 MIL/uL (ref 3.87–5.11)
RDW: 14.4 % (ref 11.5–15.5)
WBC: 5.9 10*3/uL (ref 4.0–10.5)
nRBC: 0 % (ref 0.0–0.2)

## 2019-07-15 LAB — COMPREHENSIVE METABOLIC PANEL
ALT: 12 U/L (ref 0–44)
AST: 15 U/L (ref 15–41)
Albumin: 2.8 g/dL — ABNORMAL LOW (ref 3.5–5.0)
Alkaline Phosphatase: 61 U/L (ref 38–126)
Anion gap: 9 (ref 5–15)
BUN: 11 mg/dL (ref 8–23)
CO2: 28 mmol/L (ref 22–32)
Calcium: 8.9 mg/dL (ref 8.9–10.3)
Chloride: 101 mmol/L (ref 98–111)
Creatinine, Ser: 0.88 mg/dL (ref 0.44–1.00)
GFR calc Af Amer: >60 mL/min (ref >60)
GFR calc non Af Amer: 57 mL/min — ABNORMAL LOW (ref >60)
Glucose, Bld: 119 mg/dL — ABNORMAL HIGH (ref 70–99)
Potassium: 4 mmol/L (ref 3.5–5.1)
Sodium: 138 mmol/L (ref 135–145)
Total Bilirubin: 0.7 mg/dL (ref 0.3–1.2)
Total Protein: 6.3 g/dL — ABNORMAL LOW (ref 6.5–8.1)

## 2019-07-15 LAB — HEMOGLOBIN AND HEMATOCRIT, BLOOD
HCT: 37.1 % (ref 36.0–46.0)
Hemoglobin: 12.2 g/dL (ref 12.0–15.0)

## 2019-07-15 LAB — GLUCOSE, CAPILLARY: Glucose-Capillary: 125 mg/dL — ABNORMAL HIGH (ref 70–99)

## 2019-07-15 LAB — MAGNESIUM: Magnesium: 2 mg/dL (ref 1.7–2.4)

## 2019-07-15 LAB — PHOSPHORUS: Phosphorus: 3.2 mg/dL (ref 2.5–4.6)

## 2019-07-15 MED ORDER — SENNOSIDES-DOCUSATE SODIUM 8.6-50 MG PO TABS: 1.0000 | ORAL_TABLET | Freq: Two times a day (BID) | ORAL | Status: AC

## 2019-07-15 MED ORDER — ONDANSETRON HCL 4 MG PO TABS: 4.0000 mg | ORAL_TABLET | Freq: Four times a day (QID) | ORAL | 0 refills | Status: AC | PRN

## 2019-07-15 MED ORDER — HYDROCORTISONE (PERIANAL) 2.5 % EX CREA: TOPICAL_CREAM | Freq: Two times a day (BID) | CUTANEOUS | 0 refills | Status: AC

## 2019-07-15 MED ORDER — BISACODYL 10 MG RE SUPP: 10.0000 mg | Freq: Every day | RECTAL | 0 refills | Status: AC | PRN

## 2019-07-15 MED ORDER — POLYETHYLENE GLYCOL 3350 17 G PO PACK: 17.0000 g | PACK | Freq: Two times a day (BID) | ORAL | 0 refills | Status: AC

## 2019-07-15 NOTE — Discharge Summary (Addendum)
Physician Discharge Summary  [name] [id number] DOB: 20-Jun-1924 DOA: 07/13/2019  PCP: Chesley Noon, MD  Admit date: 07/13/2019 Discharge date: 07/15/2019  Admitted From: SNF ([facility]) Disposition: SNF  Recommendations for Outpatient Follow-up:  1. Follow up with PCP in 1-2 weeks 2. Follow up with Cardiology within 1-2 weeks 3. Follow up with Gastroenterology if needed  4. Please obtain CMP/CBC, Mag, Phos in one week 5. Please follow up on the following pending results:  Home Health: No Equipment/Devices: None    Discharge Condition: Stable  CODE STATUS: DNR, pOA Diet recommendation: Soft Heart Healthy Diet   Brief/Interim Summary: [name] is a 85 y.o. female with a PMH signficiant for but not limited too she had first-degree AV block and history of acute renal failure, allergic rhinitis, anemia of not other specified, CAD status post DES to mid LAD, history of CKD stage III, diabetes mellitus type 2 complicated by neuropathy, hypertension, hyperlipidemia, obesity, dementia, as well as other comorbidities including atrial fibrillation on anticoagulation who presents with rectal bleeding this morning with blood clots.  Of note she was being changed at her nursing facility and they found that she had some rectal bleeding and noted blood when they change her diaper.  They noted to have a clot and stated it was maroon in color.  She is on anticoagulation with apixaban due to her history of proximal atrial fibrillation.  Of note she recently had Covid diagnosis a few weeks ago and has had a persistent cough but recovered since then.  Due to her current baseline dementia she is unable to provide much of a subjective history but she did note that she was from [facility] and that she did know herself and her birthday but did not know where she was in the hospital. Most of the history is obtained from the chart as well as the patient's granddaughter.  Patient denies  any chest pain, lightheadedness or dizziness or any abdominal pain denies any shortness of breath chills or fever.  Patient did admit to having some crampy abdominal pain on the right side of her abdomen.  [facility] was asked to admit this patient for rectal bleeding and gastroenterology was consulted for further evaluation recommendations.  ED Course: In the ED she had basic blood work done and she tested positive for FOBT.  She was given a liter normal saline bolus and then she was placed on normal saline 125 which was reduced down to 75 mL's per day.  Is also placed on Protonix drip which was then changed to p.o. by gastroenterology.  Of note SARS corona 2 testing was negative.  Patient also had a chest x-ray and a CT of the abdomen pelvis  **Interim History Proved from the bleeding standpoint and it was suspected that she either had a diverticular bleed versus hemorrhoidal bleed but she had a bowel movement yesterday with minimal blood noted and today she had no blood noted as nurse describe it as brown.  Hemoglobin/hematocrit has been stable and GI added hydrocortisone cream for questionable hemorrhoidal bleeding.  They plan for no further intervention or colonoscopy given her stable hemoglobin trend.  They recommended resuming Eliquis at discharge and following up.  Patient was deemed stable for discharge perspective as her diet was advanced to soft diet.  Discharge Diagnoses:  Active Problems:   GIB (gastrointestinal bleeding)   GI bleeding  Rectal bleeding in the setting of anticoagulant use likely from diverticulosis vs. Hemorrhoidal Bleed; Favor Hemorrhoids  -  Patient observation telemetry -Held patient's apixaban but ok to Resume at Discharge  -Continue to monitor for signs and symptoms of bleeding; patient was placed on a PPI IV this was changed to p.o. by Gastroenterology -Gastroenterology is consulted for further evaluation recommendations and recommends clear liquid diet for now and  laxatives for constipation along with Conservative Measures -Repeating CBC in a.m. as her hemoglobin/hematocrit is relatively stable and has remained stable -If patient rebleeds then they recommend obtaining nuclear medicine RBC bleeding scan -Given 1 L of normal saline in the ED and placed on 75 mL's per hour and continued for 1 day  -CT of the abdomen and pelvis showed "Sigmoid diverticulosis. Moderate stool burden throughout the colon. Aortic atherosclerosis.No acute findings in the abdomen or pelvis. Trace left pleural effusion. Bibasilar atelectasis." -Prior colonoscopy showed that she had severe diverticulosis and GI Recommending no further Intervention or Colonoscopy at this time.  -Further care per Gastroenterology and they have added Hydrocortisone Cream for ? Hemorrhoidal Bleeding   Constipation, improve -CT of the abdomen pelvis showed moderate stool burden; Had a Large BM yesterday  -Started the patient on laxatives and will give MiraLAX 17 g twice daily as well as senna docusate 1 tab p.o. twice daily and Continue at Discharge  -We will add Bisacodyl suppository if necessary and will continue at Discharge  Lower Back Pain -Has a history of chronic back pain but granddaughter states that she fell out of her wheelchair onto her back  -Obtained a thoracic spine x-ray as well as a lumbar spine x-ray and Thoracic Spine showed "No acute abnormality of the thoracic spine" and Lumbar Spine showed "There is no evidence of lumbar spine fracture. Alignment is normal. Multilevel degenerative change is consistent compared to the prior study. Progression of lateral osteophyte at the L2-3 level." -Consider MRI if not improving -C/w Pain control   Dementia -Has slight Dementia but she is awake and alert and oriented x3 -Continue with all divalproex 125 mg p.o. nightly and Donepezil at D/C  Diabetes mellitus type 2 -Continue gabapentin 300 mg daily for peripheral neuropathy -No longer  taking Linagliptin -Continue to monitor blood sugars carefully -Blood sugar on admission was 147 and will continue monitor carefully and this AM was 119 and CBG was 125  GERD -Continue with PPI as above  Stage 2 Sacral Decubitus Ulcer -poA -C/w Wound Care -Frequent Turning   Hypertension -Continue with Amlodipine 2.5 mg p.o. daily and with lisinopril 10 mg p.o. daily  Paroxysmal Atrial Fibrillation -Currently was holding her Apixaban 5 mg p.o. twice daily but resume at Discharge -Not on any beta blockade due to history of bradycardia -Follow up with Cardiology in the outpatient settint  CAD and history of unstable angina -Continue Lisinopril  -Not complaining of any chest pain and not on any beta-blockers due to bradycardia  Discharge Instructions  Discharge Instructions    Call MD for:   Complete by: As directed    Call MD for:  difficulty breathing, headache or visual disturbances   Complete by: As directed    Call MD for:  extreme fatigue   Complete by: As directed    Call MD for:  hives   Complete by: As directed    Call MD for:  persistant dizziness or light-headedness   Complete by: As directed    Call MD for:  persistant nausea and vomiting   Complete by: As directed    Call MD for:  redness, tenderness, or signs of infection (pain,  swelling, redness, odor or green/yellow discharge around incision site)   Complete by: As directed    Call MD for:  severe uncontrolled pain   Complete by: As directed    Call MD for:  temperature >100.4   Complete by: As directed    Diet - low sodium heart healthy   Complete by: As directed    Diet Carb Modified   Complete by: As directed    Discharge instructions   Complete by: As directed    You were cared for by a hospitalist during your hospital stay. If you have any questions about your discharge medications or the care you received while you were in the hospital after you are discharged, you can call the unit and ask  to speak with the hospitalist on call if the hospitalist that took care of you is not available. Once you are discharged, your primary care physician will handle any further medical issues. Please note that NO REFILLS for any discharge medications will be authorized once you are discharged, as it is imperative that you return to your primary care physician (or establish a relationship with a primary care physician if you do not have one) for your aftercare needs so that they can reassess your need for medications and monitor your lab values.  Follow up with PCP, Cardiology, and Gastroenterology if necessary in the outpatient setting. Take all medications as prescribed. If symptoms change or worsen please return to the ED for evaluation   Increase activity slowly   Complete by: As directed      Allergies as of 07/15/2019      Reactions   Amoxicillin Other (See Comments)   UNKNOWN      Medication List    STOP taking these medications   linagliptin 5 MG Tabs tablet Commonly known as: TRADJENTA     TAKE these medications   acetaminophen 500 MG tablet Commonly known as: TYLENOL Take 1,000 mg by mouth 3 (three) times daily.   amLODipine 2.5 MG tablet Commonly known as: NORVASC Take 2.5 mg by mouth daily.   apixaban 5 MG Tabs tablet Commonly known as: Eliquis Take 1 tablet (5 mg total) by mouth 2 (two) times daily.   Biofreeze 4 % Gel Generic drug: Menthol (Topical Analgesic) Apply 1 application topically 2 (two) times daily.   bisacodyl 10 MG suppository Commonly known as: DULCOLAX Place 1 suppository (10 mg total) rectally daily as needed for moderate constipation.   cetirizine 10 MG tablet Commonly known as: ZYRTEC Take 10 mg by mouth daily.   dextromethorphan-guaiFENesin 10-100 MG/5ML liquid Commonly known as: ROBITUSSIN-DM Take 10 mLs by mouth every 4 (four) hours as needed for cough.   diclofenac sodium 1 % Gel Commonly known as: VOLTAREN Apply 2 g topically 4 (four)  times daily.   divalproex 125 MG DR tablet Commonly known as: DEPAKOTE Take 125 mg by mouth at bedtime.   donepezil 10 MG tablet Commonly known as: ARICEPT Take 10 mg by mouth at bedtime.   fluticasone 50 MCG/ACT nasal spray Commonly known as: FLONASE Place 2 sprays into both nostrils daily.   furosemide 40 MG tablet Commonly known as: LASIX Take 40 mg by mouth daily.   gabapentin 600 MG tablet Commonly known as: NEURONTIN Take 600 mg by mouth every evening.   gabapentin 300 MG capsule Commonly known as: NEURONTIN Take 300 mg by mouth daily.   hydrocortisone 2.5 % rectal cream Commonly known as: ANUSOL-HC Place rectally 2 (two) times daily.  lisinopril 10 MG tablet Commonly known as: ZESTRIL Take 10 mg by mouth daily.   Lumigan 0.01 % Soln Generic drug: bimatoprost Place 1 drop into both eyes at bedtime.   Melatonin 10 MG Tabs Take 10 mg by mouth at bedtime.   nitroGLYCERIN 0.4 MG SL tablet Commonly known as: Nitrostat Place 1 tablet (0.4 mg total) under the tongue every 5 (five) minutes as needed for chest pain (Max 3 doses).   ondansetron 4 MG tablet Commonly known as: ZOFRAN Take 1 tablet (4 mg total) by mouth every 6 (six) hours as needed for nausea.   oxybutynin 5 MG tablet Commonly known as: DITROPAN Take 5 mg by mouth 2 (two) times daily.   polyethylene glycol 17 g packet Commonly known as: MIRALAX / GLYCOLAX Take 17 g by mouth 2 (two) times daily. What changed:   when to take this  reasons to take this   senna-docusate 8.6-50 MG tablet Commonly known as: Senokot-S Take 1 tablet by mouth 2 (two) times daily. What changed: when to take this   traMADol 50 MG tablet Commonly known as: ULTRAM Take 25 mg by mouth every 12 (twelve) hours as needed for moderate pain (given half a tablet for pain).   traZODone 150 MG tablet Commonly known as: DESYREL Take 75 mg by mouth at bedtime.       Allergies  Allergen Reactions  . Amoxicillin Other  (See Comments)    UNKNOWN   Consultations:  Gastroenterology  Procedures/Studies: DG Thoracic Spine 2 View  Result Date: 07/13/2019 CLINICAL DATA:  Fall EXAM: THORACIC SPINE 2 VIEWS COMPARISON:  08/11/2016 FINDINGS: Limited visualization due to cross-table positioning. No acute fracture. IMPRESSION: No acute abnormality of the thoracic spine. Electronically Signed   By: Ulyses Jarred M.D.   On: 07/13/2019 23:07   DG Lumbar Spine 2-3 Views  Result Date: 07/13/2019 CLINICAL DATA:  Fall with back pain EXAM: LUMBAR SPINE - 2-3 VIEW COMPARISON:  09/21/2012 FINDINGS: There is no evidence of lumbar spine fracture. Alignment is normal. Multilevel degenerative change is consistent compared to the prior study. Progression of lateral osteophyte at the L2-3 level. IMPRESSION: No acute abnormality of the lumbar spine. Electronically Signed   By: Ulyses Jarred M.D.   On: 07/13/2019 23:05   CT ABDOMEN PELVIS W CONTRAST  Result Date: 07/13/2019 CLINICAL DATA:  Melena.  Sacral pressure sores.  Chronic back pain. EXAM: CT ABDOMEN AND PELVIS WITH CONTRAST TECHNIQUE: Multidetector CT imaging of the abdomen and pelvis was performed using the standard protocol following bolus administration of intravenous contrast. CONTRAST:  159mL OMNIPAQUE IOHEXOL 300 MG/ML  SOLN COMPARISON:  12/30/2015 FINDINGS: Lower chest: Bibasilar opacities dependently, likely atelectasis. Trace left pleural effusion. Heart is borderline in size. Hepatobiliary: No focal liver abnormality is seen. Status post cholecystectomy. No biliary dilatation. Pancreas: No focal abnormality or ductal dilatation. Spleen: No focal abnormality.  Normal size. Adrenals/Urinary Tract: No adrenal abnormality. No focal renal abnormality. No stones or hydronephrosis. Urinary bladder is unremarkable. Stomach/Bowel: Sigmoid diverticulosis. Moderate stool throughout the colon. No evidence of bowel obstruction. Normal appendix. Stomach and small bowel unremarkable.  Vascular/Lymphatic: Aortic atherosclerosis. No enlarged abdominal or pelvic lymph nodes. Reproductive: Prior hysterectomy.  No adnexal masses. Other: No free fluid or free air. Musculoskeletal: No acute bony abnormality. IMPRESSION: Sigmoid diverticulosis.  Moderate stool burden throughout the colon. Aortic atherosclerosis. No acute findings in the abdomen or pelvis. Trace left pleural effusion.  Bibasilar atelectasis. Electronically Signed   By: Rolm Baptise M.D.  On: 07/13/2019 13:48   DG Chest Port 1 View  Result Date: 07/13/2019 CLINICAL DATA:  Cough.  Recent positive COVID test EXAM: PORTABLE CHEST 1 VIEW COMPARISON:  03/20/2018 FINDINGS: Borderline heart size. Small left pleural effusion left base atelectasis. No confluent opacity on the right. No acute bony abnormality. IMPRESSION: Small left pleural effusion with left base atelectasis. Electronically Signed   By: Rolm Baptise M.D.   On: 07/13/2019 12:42    Subjective: Seen and examined at bedside and she is doing well.  Denies chest pain, lightheadedness or dizziness.  Denies any abdominal pain.  Had a bowel movement this morning nurses described as brown and no evidence of any blood in it.  Hemoglobin is stable.  She has no other concerns or complaints and feels well stable for discharge to her skilled nursing facility  Discharge Exam: Vitals:   07/14/19 1948 07/15/19 0656  BP: 116/66 131/69  Pulse: 83 76  Resp: 17 17  Temp: 98.1 F (36.7 C) 98 F (36.7 C)  SpO2: 97% 96%   Vitals:   07/14/19 1627 07/14/19 1732 07/14/19 1948 07/15/19 0656  BP: 123/68  116/66 131/69  Pulse: 81  83 76  Resp: 16  17 17   Temp: 98 F (36.7 C) 98.5 F (36.9 C) 98.1 F (36.7 C) 98 F (36.7 C)  TempSrc: Oral Oral Oral Oral  SpO2:   97% 96%  Weight: 83.9 kg     Height: 5' 2.99" (1.6 m)      General: Pt is alert, awake, not in acute distress Cardiovascular: RRR, S1/S2 +, no rubs, no gallops Respiratory: Diminished bilaterally, no wheezing, no  rhonchi; Unlabored breathing Abdominal: Soft, NT, Distended due to body habitus, bowel sounds + Extremities: no edema, no cyanosis  The results of significant diagnostics from this hospitalization (including imaging, microbiology, ancillary and laboratory) are listed below for reference.    Microbiology: Recent Results (from the past 240 hour(s))  SARS CORONAVIRUS 2 (TAT 6-24 HRS) Nasopharyngeal Nasopharyngeal Swab     Status: None   Collection Time: 07/13/19 12:15 PM   Specimen: Nasopharyngeal Swab  Result Value Ref Range Status   SARS Coronavirus 2 NEGATIVE NEGATIVE Final    Comment: (NOTE) SARS-CoV-2 target nucleic acids are NOT DETECTED. The SARS-CoV-2 RNA is generally detectable in upper and lower respiratory specimens during the acute phase of infection. Negative results do not preclude SARS-CoV-2 infection, do not rule out co-infections with other pathogens, and should not be used as the sole basis for treatment or other patient management decisions. Negative results must be combined with clinical observations, patient history, and epidemiological information. The expected result is Negative. Fact Sheet for Patients: SugarRoll.be Fact Sheet for Healthcare Providers: https://www.woods-mathews.com/ This test is not yet approved or cleared by the [place] FDA and  has been authorized for detection and/or diagnosis of SARS-CoV-2 by FDA under an Emergency Use Authorization (EUA). This EUA will remain  in effect (meaning this test can be used) for the duration of the COVID-19 declaration under Section 56 4(b)(1) of the Act, 21 U.S.C. section 360bbb-3(b)(1), unless the authorization is terminated or revoked sooner. Performed at [facility], [place] [address] [place], [place] [address]     Labs: BNP (last 3 results) No results for input(s): BNP in the last 8760 hours. Basic Metabolic Panel: Recent Labs  Lab 07/13/19 1121  07/14/19 0636 07/15/19 0606  NA 138 137 138  K 4.4 3.6 4.0  CL 96* 97* 101  CO2 30 24  28  GLUCOSE 147* 146* 119*  BUN 17 14 11   CREATININE 0.97 0.76 0.88  CALCIUM 8.9 8.8* 8.9  MG  --   --  2.0  PHOS  --   --  3.2   Liver Function Tests: Recent Labs  Lab 07/13/19 1121 07/14/19 0636 07/15/19 0606  AST 20 19 15   ALT 16 17 12   ALKPHOS 58 75 61  BILITOT 0.7 1.5* 0.7  PROT 6.6 7.3 6.3*  ALBUMIN 3.1* 3.5 2.8*   No results for input(s): LIPASE, AMYLASE in the last 168 hours. No results for input(s): AMMONIA in the last 168 hours. CBC: Recent Labs  Lab 07/13/19 1121 07/14/19 0636 07/15/19 0548 07/15/19 0606  WBC 6.0 8.2  --  5.9  NEUTROABS 3.9  --   --  3.2  HGB 12.8 12.8 12.2 12.2  HCT 39.4 38.8 37.1 38.1  MCV 94.7 93.3  --  95.5  PLT 246 220  --  236   Cardiac Enzymes: No results for input(s): CKTOTAL, CKMB, CKMBINDEX, TROPONINI in the last 168 hours. BNP: Invalid input(s): POCBNP CBG: Recent Labs  Lab 07/14/19 0920 07/15/19 0834  GLUCAP 193* 125*   D-Dimer No results for input(s): DDIMER in the last 72 hours. Hgb A1c Recent Labs    07/14/19 0636  HGBA1C 6.3*   Lipid Profile No results for input(s): CHOL, HDL, LDLCALC, TRIG, CHOLHDL, LDLDIRECT in the last 72 hours. Thyroid function studies No results for input(s): TSH, T4TOTAL, T3FREE, THYROIDAB in the last 72 hours.  Invalid input(s): FREET3 Anemia work up No results for input(s): VITAMINB12, FOLATE, FERRITIN, TIBC, IRON, RETICCTPCT in the last 72 hours. Urinalysis    Component Value Date/Time   COLORURINE AMBER (A) 01/15/2018 1412   APPEARANCEUR HAZY (A) 01/15/2018 1412   LABSPEC 1.014 01/15/2018 1412   PHURINE 6.0 01/15/2018 1412   GLUCOSEU NEGATIVE 01/15/2018 1412   GLUCOSEU NEGATIVE 11/24/2010 1224   HGBUR SMALL (A) 01/15/2018 1412   BILIRUBINUR NEGATIVE 01/15/2018 1412   KETONESUR 20 (A) 01/15/2018 1412   PROTEINUR 30 (A) 01/15/2018 1412   UROBILINOGEN 0.2 05/05/2014 2123   NITRITE  POSITIVE (A) 01/15/2018 1412   LEUKOCYTESUR MODERATE (A) 01/15/2018 1412   Sepsis Labs Invalid input(s): PROCALCITONIN,  WBC,  LACTICIDVEN Microbiology Recent Results (from the past 240 hour(s))  SARS CORONAVIRUS 2 (TAT 6-24 HRS) Nasopharyngeal Nasopharyngeal Swab     Status: None   Collection Time: 07/13/19 12:15 PM   Specimen: Nasopharyngeal Swab  Result Value Ref Range Status   SARS Coronavirus 2 NEGATIVE NEGATIVE Final    Comment: (NOTE) SARS-CoV-2 target nucleic acids are NOT DETECTED. The SARS-CoV-2 RNA is generally detectable in upper and lower respiratory specimens during the acute phase of infection. Negative results do not preclude SARS-CoV-2 infection, do not rule out co-infections with other pathogens, and should not be used as the sole basis for treatment or other patient management decisions. Negative results must be combined with clinical observations, patient history, and epidemiological information. The expected result is Negative. Fact Sheet for Patients: SugarRoll.be Fact Sheet for Healthcare Providers: https://www.woods-mathews.com/ This test is not yet approved or cleared by the [place] FDA and  has been authorized for detection and/or diagnosis of SARS-CoV-2 by FDA under an Emergency Use Authorization (EUA). This EUA will remain  in effect (meaning this test can be used) for the duration of the COVID-19 declaration under Section 56 4(b)(1) of the Act, 21 U.S.C. section 360bbb-3(b)(1), unless the authorization is terminated or revoked sooner. Performed at [facility], [place] [address], [place], [place] [address]    Time coordinating discharge: 35 minutes  SIGNED:  Kerney Elbe, DO [facility] 07/15/2019, 1:29 PM Pager is on [place]  If 7PM-7AM, please contact night-coverage [email or web address] Password [email or web address]

## 2019-07-15 NOTE — Telephone Encounter (Signed)
error 

## 2019-07-15 NOTE — NC FL2 (Signed)
[place] LEVEL OF CARE SCREENING TOOL     IDENTIFICATION  Patient Name: [name] Birthdate: Nov 25, 1923 Sex: female Admission Date (Current Location): 07/13/2019  [facility] and [place] Number:  Herbalist and Address:  [facility],  [place] [place], [place]      Provider Number: [id number]  Attending Physician Name and Address:  Kerney Elbe, DO  Relative Name and Phone Number:       Current Level of Care: Hospital Recommended Level of Care: [place] Prior Approval Number:    Date Approved/Denied:   PASRR Number: [id number]  Discharge Plan: SNF    Current Diagnoses: Patient Active Problem List   Diagnosis Date Noted  . GI bleeding 07/14/2019  . GIB (gastrointestinal bleeding) 07/13/2019  . Acute metabolic encephalopathy [id number]  . Syncope 02/17/2017  . Primary osteoarthritis of both knees 01/30/2017  . Primary osteoarthritis of left shoulder 12/10/2016  . Slow transit constipation 10/22/2016  . PVC's (premature ventricular contractions) 03/07/2016  . Depression 03/06/2016  . Chest pain at rest 03/06/2016  . Bilateral sensorineural hearing loss 01/17/2016  . Pressure sensation in right ear 01/17/2016  . Unstable angina ([place]) 10/24/2015  . Arthritis of right hip 09/07/2015  . DDD (degenerative disc disease), lumbar 09/07/2015  . Epidermoid cyst of labia majora 09/02/2015  . Chronic anticoagulation for A fib, CHA2DS2VASc = 6 12/18/2014  . Musculoskeletal chest pain 12/17/2014  . Diabetes mellitus type II, non insulin dependent ([place])   . HTN (hypertension)   . Chest pain 11/20/2011  . Tachycardia 08/07/2011  . Atrial fibrillation, permanent 06/25/2011  . Near Syncope 06/06/2011  . Coronary artery disease 04/05/2011  . Bradycardia 12/09/2009  . ACUTE BRONCHITIS 06/17/2009  . COPD, moderate ([place]) 06/17/2009  . PULMONARY NODULE, RIGHT UPPER LOBE 06/07/2009  . SHORTNESS OF BREATH  (SOB) 06/07/2009  . POSTURAL HYPOTENSION 05/23/2009  . DEHYDRATION 05/13/2009  . ABDOMINAL PAIN, LOWER 01/21/2009  . HOARSENESS, CHRONIC 12/03/2007  . GOITER 09/30/2007  . DIABETIC PERIPHERAL NEUROPATHY 09/30/2007  . OBESITY 09/30/2007  . HIATAL HERNIA 09/30/2007  . OSTEOARTHRITIS 09/30/2007  . SPINAL STENOSIS 09/30/2007  . PERIPHERAL EDEMA 09/30/2007  . HLD (hyperlipidemia) 02/12/2007  . ANEMIA-NOS 02/12/2007  . Neuropathy ([place]) 02/12/2007  . Allergic rhinitis, cause unspecified 02/12/2007  . GERD 02/12/2007  . DIVERTICULOSIS, COLON 02/12/2007    Orientation RESPIRATION BLADDER Height & Weight     Self, Place  Normal External catheter Weight: 83.9 kg Height:  5' 2.99" (160 cm)  BEHAVIORAL SYMPTOMS/MOOD NEUROLOGICAL BOWEL NUTRITION STATUS  (none) (none) Incontinent Diet(See D/C summary)  AMBULATORY STATUS COMMUNICATION OF NEEDS Skin   Extensive Assist Verbally PU Stage and Appropriate Care([place])                       Personal Care Assistance Level of Assistance  Bathing, Feeding, Dressing Bathing Assistance: Maximum assistance Feeding assistance: Independent Dressing Assistance: Limited assistance     Functional Limitations Info  Sight, Hearing, Speech Sight Info: Adequate Hearing Info: Impaired Speech Info: Adequate    SPECIAL CARE FACTORS FREQUENCY                       Contractures Contractures Info: Not present    Additional Factors Info  Code Status, Allergies Code Status Info: DNR Allergies Info: Amoxicillin           Current Medications (07/15/2019):  This is the current hospital active medication list Current  Facility-Administered Medications  Medication Dose Route Frequency Provider Last Rate Last Admin  . acetaminophen (TYLENOL) tablet 650 mg  650 mg Oral Q6H PRN Raiford Noble Latif, DO       Or  . acetaminophen (TYLENOL) suppository 650 mg  650 mg Rectal Q6H PRN Raiford Noble Latif, DO      . amLODipine (NORVASC) tablet 2.5 mg   2.5 mg Oral Daily Raiford Noble Latif, DO   2.5 mg at 07/15/19 1057  . bisacodyl (DULCOLAX) suppository 10 mg  10 mg Rectal Daily PRN Raiford Noble Latif, DO      . divalproex (DEPAKOTE) DR tablet 125 mg  125 mg Oral QHS Raiford Noble [place], [place]   125 mg at 07/14/19 2102  . donepezil (ARICEPT) tablet 10 mg  10 mg Oral QHS Sheikh, Georgina [place], DO   10 mg at 07/14/19 2100  . fluticasone (FLONASE) 50 MCG/ACT nasal spray 2 spray  2 spray Each Nare Daily Sheikh, Omair Latif, DO      . gabapentin (NEURONTIN) capsule 300 mg  300 mg Oral Daily Raiford Noble Latif, DO   300 mg at 07/15/19 1057  . gabapentin (NEURONTIN) capsule 600 mg  600 mg Oral QPM SheikhGeorgina Quint [place], DO   600 mg at 07/14/19 1731  . hydrocortisone (ANUSOL-HC) 2.5 % rectal cream   Rectal BID Willia Craze, NP   Given at 07/15/19 1058  . latanoprost (XALATAN) 0.005 % ophthalmic solution 1 drop  1 drop Both Eyes QHS Raiford Noble [place], DO   1 drop at 07/14/19 2102  . lisinopril (ZESTRIL) tablet 10 mg  10 mg Oral Daily Raiford Noble [place], DO   10 mg at 07/15/19 1057  . loratadine (CLARITIN) tablet 10 mg  10 mg Oral Daily Raiford Noble [place], DO   10 mg at 07/15/19 1056  . nitroGLYCERIN (NITROSTAT) SL tablet 0.4 mg  0.4 mg Sublingual Q5 min PRN Sheikh, Omair Latif, DO      . ondansetron [facility]) tablet 4 mg  4 mg Oral Q6H PRN Raiford Noble Latif, DO       Or  . ondansetron [facility]) injection 4 mg  4 mg Intravenous Q6H PRN Raiford Noble Latif, DO      . oxybutynin (DITROPAN) tablet 5 mg  5 mg Oral BID Sheikh, Omair Latif, DO   5 mg at 07/15/19 1056  . polyethylene glycol (MIRALAX / GLYCOLAX) packet 17 g  17 g Oral BID Raiford Noble [place], DO   17 g at 07/15/19 1058  . senna-docusate (Senokot-S) tablet 1 tablet  1 tablet Oral BID Raiford Noble [place], DO   1 tablet at 07/15/19 1056  . traMADol (ULTRAM) tablet 25 mg  25 mg Oral Q12H PRN Raiford Noble Latif, DO      . traZODone (DESYREL) tablet 75 mg  75 mg Oral QHS Raiford Noble [place], DO    75 mg at 07/14/19 2101     Discharge Medications: Please see discharge summary for a list of discharge medications.  Relevant Imaging Results:  Relevant Lab Results:   Additional Information ssn# [id number]  Trish Mage, [place]

## 2019-07-15 NOTE — TOC Transition Note (Signed)
Transition of Care [facility]) - CM/SW Discharge Note   Patient Details  Name: JILLIEN LOUWAGIE MRN: [id number] Date of Birth: [birth year]  [facility] CM/SW Contact:  Trish Mage, LCSW Phone Number: 07/15/2019, 11:44 AM   Clinical Narrative:   [facility] is set to return today.  Confirmed return with Traci at [facility]. Filled out FL2 for return. Set up PTAR.  Nursing, please call report to [phone], [facility]. TOC sign off.    Final next level of care: Skilled Nursing Facility Barriers to Discharge: No Barriers Identified   Patient Goals and CMS Choice        Discharge Placement                       Discharge Plan and Services                                     Social Determinants of Health (SDOH) Interventions     Readmission Risk Interventions No flowsheet data found.

## 2019-07-15 NOTE — Progress Notes (Signed)
Nsg Discharge Note  Admit Date:  07/13/2019 Discharge date: 07/15/2019   [facility] to be D/C'd Skilled nursing facility per MD order.  AVS completed.  Copy for chart, and copy for patient signed, and dated. Patient/caregiver able to verbalize understanding.  Discharge Medication: Allergies as of 07/15/2019      Reactions   Amoxicillin Other (See Comments)   UNKNOWN      Medication List    STOP taking these medications   linagliptin 5 MG Tabs tablet Commonly known as: TRADJENTA     TAKE these medications   acetaminophen 500 MG tablet Commonly known as: TYLENOL Take 1,000 mg by mouth 3 (three) times daily.   amLODipine 2.5 MG tablet Commonly known as: NORVASC Take 2.5 mg by mouth daily.   apixaban 5 MG Tabs tablet Commonly known as: Eliquis Take 1 tablet (5 mg total) by mouth 2 (two) times daily.   Biofreeze 4 % Gel Generic drug: Menthol (Topical Analgesic) Apply 1 application topically 2 (two) times daily.   bisacodyl 10 MG suppository Commonly known as: DULCOLAX Place 1 suppository (10 mg total) rectally daily as needed for moderate constipation.   cetirizine 10 MG tablet Commonly known as: ZYRTEC Take 10 mg by mouth daily.   dextromethorphan-guaiFENesin 10-100 MG/5ML liquid Commonly known as: ROBITUSSIN-DM Take 10 mLs by mouth every 4 (four) hours as needed for cough.   diclofenac sodium 1 % Gel Commonly known as: VOLTAREN Apply 2 g topically 4 (four) times daily.   divalproex 125 MG DR tablet Commonly known as: DEPAKOTE Take 125 mg by mouth at bedtime.   donepezil 10 MG tablet Commonly known as: ARICEPT Take 10 mg by mouth at bedtime.   fluticasone 50 MCG/ACT nasal spray Commonly known as: FLONASE Place 2 sprays into both nostrils daily.   furosemide 40 MG tablet Commonly known as: LASIX Take 40 mg by mouth daily.   gabapentin 600 MG tablet Commonly known as: NEURONTIN Take 600 mg by mouth every evening.   gabapentin 300 MG  capsule Commonly known as: NEURONTIN Take 300 mg by mouth daily.   hydrocortisone 2.5 % rectal cream Commonly known as: ANUSOL-HC Place rectally 2 (two) times daily.   lisinopril 10 MG tablet Commonly known as: ZESTRIL Take 10 mg by mouth daily.   Lumigan 0.01 % Soln Generic drug: bimatoprost Place 1 drop into both eyes at bedtime.   Melatonin 10 MG Tabs Take 10 mg by mouth at bedtime.   nitroGLYCERIN 0.4 MG SL tablet Commonly known as: Nitrostat Place 1 tablet (0.4 mg total) under the tongue every 5 (five) minutes as needed for chest pain (Max 3 doses).   ondansetron 4 MG tablet Commonly known as: ZOFRAN Take 1 tablet (4 mg total) by mouth every 6 (six) hours as needed for nausea.   oxybutynin 5 MG tablet Commonly known as: DITROPAN Take 5 mg by mouth 2 (two) times daily.   polyethylene glycol 17 g packet Commonly known as: MIRALAX / GLYCOLAX Take 17 g by mouth 2 (two) times daily. What changed:   when to take this  reasons to take this   senna-docusate 8.6-50 MG tablet Commonly known as: Senokot-S Take 1 tablet by mouth 2 (two) times daily. What changed: when to take this   traMADol 50 MG tablet Commonly known as: ULTRAM Take 25 mg by mouth every 12 (twelve) hours as needed for moderate pain (given half a tablet for pain).   traZODone 150 MG tablet Commonly known as: DESYREL Take  75 mg by mouth at bedtime.       Discharge Assessment: Vitals:   07/14/19 1948 07/15/19 0656  BP: 116/66 131/69  Pulse: 83 76  Resp: 17 17  Temp: 98.1 F (36.7 C) 98 F (36.7 C)  SpO2: 97% 96%   Skin clean, dry and intact without evidence of skin break down, no evidence of skin tears noted. IV catheter discontinued intact. Site without signs and symptoms of complications - no redness or edema noted at insertion site, patient denies c/o pain - only slight tenderness at site.  Dressing with slight pressure applied.  D/c Instructions-Education: Discharge instructions given  to patient/family with verbalized understanding. D/c education completed with patient/family including follow up instructions, medication list, d/c activities limitations if indicated, with other d/c instructions as indicated by MD - patient able to verbalize understanding, all questions fully answered. Patient instructed to return to ED, call 911, or call MD for any changes in condition.  Patient escorted via stretcher, and D/C to SNF by PTAR.   Eustace Pen, RN 07/15/2019 6:45 PM

## 2019-07-15 NOTE — Progress Notes (Signed)
Attempted to contact [facility] to give report. Writer was provided contact numbers for supervisors @ [phone] and [phone]. Attempts to reach supervisors were unsuccessful. Left message on both phones.

## 2019-09-02 ENCOUNTER — Encounter: Payer: Self-pay | Admitting: Cardiovascular Disease

## 2023-08-24 DEATH — deceased
